# Patient Record
Sex: Male | Born: 1950 | Race: White | Hispanic: No | Marital: Single | Smoking: Current some day smoker
Health system: Southern US, Community
[De-identification: ages and names within clinical notes are randomized; demographics above are authoritative.]

## PROBLEM LIST (undated history)

## (undated) DIAGNOSIS — J189 Pneumonia, unspecified organism: Secondary | ICD-10-CM

## (undated) DIAGNOSIS — C61 Malignant neoplasm of prostate: Secondary | ICD-10-CM

## (undated) DIAGNOSIS — J449 Chronic obstructive pulmonary disease, unspecified: Secondary | ICD-10-CM

## (undated) HISTORY — PX: PROSTATE BIOPSY: SHX241

## (undated) HISTORY — PX: HAND SURGERY: SHX662

## (undated) HISTORY — PX: BACK SURGERY: SHX140

---

## 2004-10-29 ENCOUNTER — Emergency Department (HOSPITAL_COMMUNITY): Admission: EM | Admit: 2004-10-29 | Discharge: 2004-10-29 | Payer: Self-pay | Admitting: Emergency Medicine

## 2004-12-13 ENCOUNTER — Ambulatory Visit (HOSPITAL_COMMUNITY): Admission: RE | Admit: 2004-12-13 | Discharge: 2004-12-13 | Payer: Self-pay | Admitting: Orthopedic Surgery

## 2004-12-13 ENCOUNTER — Ambulatory Visit (HOSPITAL_BASED_OUTPATIENT_CLINIC_OR_DEPARTMENT_OTHER): Admission: RE | Admit: 2004-12-13 | Discharge: 2004-12-13 | Payer: Self-pay | Admitting: Orthopedic Surgery

## 2005-05-13 ENCOUNTER — Emergency Department (HOSPITAL_COMMUNITY): Admission: EM | Admit: 2005-05-13 | Discharge: 2005-05-13 | Payer: Self-pay | Admitting: Emergency Medicine

## 2006-07-10 ENCOUNTER — Emergency Department (HOSPITAL_COMMUNITY): Admission: EM | Admit: 2006-07-10 | Discharge: 2006-07-10 | Payer: Self-pay | Admitting: Emergency Medicine

## 2017-03-06 ENCOUNTER — Encounter: Payer: Self-pay | Admitting: Radiation Oncology

## 2017-03-07 ENCOUNTER — Inpatient Hospital Stay
Admission: RE | Admit: 2017-03-07 | Discharge: 2017-03-07 | Disposition: A | Discharge status: Home / Self Care | Payer: Self-pay | Source: Ambulatory Visit | Attending: Radiation Oncology | Admitting: Radiation Oncology

## 2017-03-07 ENCOUNTER — Other Ambulatory Visit: Payer: Self-pay | Admitting: Radiation Oncology

## 2017-03-07 ENCOUNTER — Ambulatory Visit
Admission: RE | Admit: 2017-03-07 | Discharge: 2017-03-07 | Disposition: A | Discharge status: Home / Self Care | Payer: Self-pay | Source: Ambulatory Visit | Attending: Radiation Oncology | Admitting: Radiation Oncology

## 2017-03-07 DIAGNOSIS — C801 Malignant (primary) neoplasm, unspecified: Secondary | ICD-10-CM

## 2017-03-12 ENCOUNTER — Encounter: Payer: Self-pay | Admitting: Radiation Oncology

## 2017-03-12 DIAGNOSIS — C61 Malignant neoplasm of prostate: Secondary | ICD-10-CM | POA: Insufficient documentation

## 2017-03-12 NOTE — Progress Notes (Signed)
GU Location of Tumor / Histology: prostatic adenocarcinoma  If Prostate Cancer, Gleason Score is (3 + 3) and PSA is (14). Prostate volume: 36 cc.  Kathryne Sharper reports his PSA has been elevated for 4-5 years. States, "it (PSA) was 10, then 12, and 14."  Biopsies of prostate (if applicable) revealed:    Past/Anticipated interventions by urology, if any: biopsy, bone scan, referral to radiation oncology  Past/Anticipated interventions by medical oncology, if any: no  Weight changes, if any: no  Bowel/Bladder complaints, if any: IPSS 7 with frequency and nocturia. Denies dysuria, hematuria, urinary leakage or incontinence.   Nausea/Vomiting, if any: no  Pain issues, if any:  Right hip pain 9 on a scale of 0-10 worse with ambulation. Reports taking OTC MSM and Tumeric plus prescribed Ibuprofen 800 mg.  SAFETY ISSUES:  Prior radiation? no  Pacemaker/ICD? no  Possible current pregnancy? no  Is the patient on methotrexate? no  Current Complaints / other details:  66 year old male.   The suspected tumor is inseparable from the undersurface of the urinary bladder at the base and the inferior anterior aspect of the right seminal vesicle.

## 2017-03-13 ENCOUNTER — Ambulatory Visit
Admission: RE | Admit: 2017-03-13 | Discharge: 2017-03-13 | Disposition: A | Discharge status: Home / Self Care | Payer: Non-veteran care | Source: Ambulatory Visit | Attending: Radiation Oncology | Admitting: Radiation Oncology

## 2017-03-13 ENCOUNTER — Encounter: Payer: Self-pay | Admitting: Radiation Oncology

## 2017-03-13 DIAGNOSIS — J984 Other disorders of lung: Secondary | ICD-10-CM | POA: Insufficient documentation

## 2017-03-13 DIAGNOSIS — Z880 Allergy status to penicillin: Secondary | ICD-10-CM | POA: Insufficient documentation

## 2017-03-13 DIAGNOSIS — C61 Malignant neoplasm of prostate: Secondary | ICD-10-CM

## 2017-03-13 DIAGNOSIS — Z510 Encounter for antineoplastic radiation therapy: Secondary | ICD-10-CM | POA: Insufficient documentation

## 2017-03-13 DIAGNOSIS — Z87891 Personal history of nicotine dependence: Secondary | ICD-10-CM | POA: Diagnosis not present

## 2017-03-13 HISTORY — DX: Malignant neoplasm of prostate: C61

## 2017-03-13 NOTE — Progress Notes (Signed)
Radiation Oncology         [phone] ________________________________  Initial outpatient Consultation  Name: Steve Riley MRN: [id number]  Date: 03/13/2017  DOB: 02-17-1951  CC:No primary care provider on file.  [name], MD   REFERRING PHYSICIAN: [name], MD  DIAGNOSIS: 66 y.o. gentleman with stage T2a prostate cancer, Gleason score of 3+3=6 and PSA is 14.    ICD-10-CM   1. Malignant neoplasm of prostate ([place]) C61 Ambulatory referral to Social Work    HISTORY OF PRESENT ILLNESS: Steve Riley is a 66 y.o. male with a diagnosis of prostate cancer. He was noted to have an elevated PSA of 14 by his primary care physician Dr. Posey Pronto in 03/2016.  Accordingly, he was referred for evaluation in urology by Dr. Aretha Parrot at the [facility],  digital rectal examination was performed at that time revealing a symmetric gland without nodularity.  A prostate MRI was performed in 03/2016 revealing a 2.5cm PI-RADS 5 lesion at the anterior and posterior transition zone mid-gland and extending to the base. The patient proceeded to transrectal ultrasound with 12 biopsies of the prostate on 03/25/16.  The prostate volume measured 36 cc.  Out of 12 core biopsies,4 were positive.  The maximum Gleason score was 3+3, and this was seen in the right base, right mid-transitional zone, left mid and left base.    He had a repeat prostate MRI on 09/03/16 revealing stability of the 2.5 cm PI-RADS 5 lesion at the anterior and posterior transition zone mid-gland, suspicious for ECE and appearing to be inseperable from the bladder base and right seminal vesicle.  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    CT A/P 12/25/16 was negative for metastatic disease in the abdomen or pelvis and bone scan on 02/11/17 was  negative for osseous metastatic disease.  Repeat PSA 01/29/17 was 18.90.  The patient reviewed the biopsy results with his urologist and he has kindly been referred today for discussion of potential radiation treatment options.      PREVIOUS RADIATION THERAPY: No  PAST MEDICAL HISTORY:  Past Medical History:  Diagnosis Date  . Prostate cancer ([place])       PAST SURGICAL HISTORY: Past Surgical History:  Procedure Laterality Date  . BACK SURGERY    . HAND SURGERY    . PROSTATE BIOPSY      FAMILY HISTORY:  Family History  Problem Relation Age of Onset  . Cancer Neg Hx     SOCIAL HISTORY:  Social History   Social History  . Marital status: Single    Spouse name: N/A  . Number of children: N/A  . Years of education: N/A   Occupational History  . Not on file.   Social History Main Topics  . Smoking status: Former Smoker    Packs/day: 3.00    Years: 54.00    Types: Cigarettes    Quit date: 07/11/2016  . Smokeless tobacco: Never Used  . Alcohol use No  . Drug use: No  . Sexual activity: No   Other Topics Concern  . Not on file   Social History Narrative  . No narrative on file    ALLERGIES: Penicillin g  MEDICATIONS:  Current Outpatient Prescriptions  Medication Sig Dispense Refill  . UNKNOWN TO PATIENT Patient does not know names  of his medications     No current facility-administered medications for this encounter.     REVIEW OF SYSTEMS:  On review of systems, the patient reports that he is doing well overall. He denies any chest pain, shortness of breath, cough, fevers, chills, night sweats, unintended weight changes. He denies any bowel disturbances, and denies abdominal pain, nausea or vomiting. He reports chronic bilateral hip pain, right >left progressively worsening over the past year.  He uses a wheelchair due to inability to ambulate without excruciating pain.  He denies any new musculoskeletal or joint aches or pains. His IPSS was 7, indicating  mild urinary symptoms. He is unable to complete sexual activity with most attempts and was previously advised by a MD at the [place] not to use Viagra because he has a diagnosis of prostate cancer. He denies dysuria, hematuria, urinary leakage or incontinence. Right hip pain 9 on a scale of 0-10, worse with weight bearing activity and ambulation. He reports taking OTC MSM and Tumeric plus prescribed Ibuprofen 800 mg for pain.  A complete review of systems is obtained and is otherwise negative.    PHYSICAL EXAM:  Wt Readings from Last 3 Encounters:  03/13/17 210 lb 8 oz (95.5 kg)   Temp Readings from Last 3 Encounters:  No data found for Temp   BP Readings from Last 3 Encounters:  03/13/17 130/81   Pulse Readings from Last 3 Encounters:  03/13/17 99   Pain Assessment Pain Score: 9  (reports right hip pain worse with ambulation) Pain Loc: Hip/10  In general this is a well appearing caucasian male in no acute distress. He is alert and oriented x4 and appropriate throughout the examination. HEENT reveals that the patient is normocephalic, atraumatic. EOMs are intact. PERRLA. Skin is intact with multiple superficial excoriated lesions on his face and bilateral upper extremities. Cardiovascular exam reveals a regular rate and rhythm, no clicks rubs or murmurs are auscultated. Chest is clear to auscultation bilaterally. Lymphatic assessment is performed and does not reveal any adenopathy in the cervical, supraclavicular, axillary, or inguinal chains. Abdomen has active bowel sounds in all quadrants and is intact. The abdomen is soft, non tender, non distended. Lower extremities are negative for pretibial pitting edema, deep calf tenderness, cyanosis or clubbing.  KPS = 70  100 - Normal; no complaints; no evidence of disease. 90   - Able to carry on normal activity; minor signs or symptoms of disease. 80   - Normal activity with effort; some signs or symptoms of disease. 33   - Cares for self; unable  to carry on normal activity or to do active work. 60   - Requires occasional assistance, but is able to care for most of his personal needs. 50   - Requires considerable assistance and frequent medical care. 18   - Disabled; requires special care and assistance. 56   - Severely disabled; hospital admission is indicated although death not imminent. 71   - Very sick; hospital admission necessary; active supportive treatment necessary. 10   - Moribund; fatal processes progressing rapidly. 0     - Dead  Karnofsky DA, Abelmann WH, Craver LS and Burchenal JH [phone]) The use of the nitrogen mustards in the palliative treatment of carcinoma: with particular reference to bronchogenic carcinoma Cancer 1 634-56  LABORATORY DATA:  No results found for: WBC, HGB, HCT, MCV, PLT No results found for: NA, K, CL, CO2 No results found for: ALT, AST, GGT, ALKPHOS, BILITOT   RADIOGRAPHY:  No results found.    IMPRESSION/PLAN: 1. 66 y.o. gentleman with Stage T2a adenocarcinoma of the prostate with Gleason Score of 3+3, and PSA of 18.9. We discussed the patient's workup and outlined the nature of prostate cancer in this setting. The patient's T stage, Gleason's score, and PSA put him into the intermediate risk group. Accordingly, he is eligible for a variety of potential treatment options including brachytherapy, 8 weeks of external radiation or 5 weeks of external radiation followed by a brachytherapy boost. We discussed the available radiation techniques, and focused on the details and logistics and delivery. The patient is not felt to be a good surgical candidate due to underlying lung disease with poor pulmonary function.  The recommendation would be for 8 weeks of prostate IMRT.  We discussed and outlined the risks, benefits, short and long-term effects associated with radiotherapy and compared and contrasted these with prostatectomy. We will be able to forego fiducial marker placement due to use of cone beam CT.  The patient was encouraged to ask questions which were answered to his satisfaction.    At the conclusion of our conversation, the patient elects to proceed with 8 weeks of prostate IMRT and is scheduled for CT Simulation on Thursday 03/20/17 at 10am.  We will share our findings with [facility]. [facility] at the [facility].  We enjoyed meeting with him today, and will look forward to participating in the care of this very nice gentleman.   We spent 60 minutes face to face with the patient and more than 50% of that time was spent in counseling and/or coordination of care.    [name], PA-C    Tyler Pita, MD  [facility] Direct Dial: [phone]  Fax: [phone] [email or web address]  Skype  [email or web address]    This document serves as a record of services personally performed by Tyler Pita, MD and Ashlyn Bruning PA-C. It was created on their behalf by Delton Coombes, a trained medical scribe. The creation of this record is based on the scribe's personal observations and the provider's statements to them. This document has been checked and approved by the attending provider.

## 2017-03-13 NOTE — Progress Notes (Signed)
See progress note under physician encounter. 

## 2017-03-14 ENCOUNTER — Encounter: Payer: Self-pay | Admitting: *Deleted

## 2017-03-14 NOTE — Addendum Note (Signed)
Encounter addended by: Tyler Pita, MD on: 03/14/2017  7:48 AM<BR>    Actions taken: Problem List reviewed

## 2017-03-14 NOTE — Progress Notes (Signed)
[facility] Psychosocial Distress Screening Clinical Social Work  Clinical Social Work was referred by distress screening protocol.  The patient scored a 9 on the Psychosocial Distress Thermometer which indicates severe distress. Clinical Social Worker contacted patient by phone to assess for distress and other psychosocial needs. Steve Riley shared he does not have specific emotional concerns at this time and feels his information concerns were addressed by his physician.  His only concern at this time is balancing his treatment schedule with [facility]- CSW encouraged him to share this information at his upcoming appointment when he receives his radiation schedule. CSW briefly explained CSW role and provided information on prostate cancer support group.  Patient plans to call CSW as needs arise.  ONCBCN DISTRESS SCREENING 03/13/2017  Screening Type Initial Screening  Distress experienced in past week (1-10) 9  Emotional problem type Isolation/feeling alone;Adjusting to appearance changes  Information Concerns Type Lack of info about diagnosis;Lack of info about treatment  Physical Problem type Pain;Getting around;Bathing/dressing;Skin dry/itchy  Physician notified of physical symptoms Yes  Referral to clinical psychology No  Referral to clinical social work Yes  Referral to dietition No  Referral to financial advocate No  Referral to support programs No  Referral to palliative care No  Other [phone]    Maryjean Morn, MSW, LCSW, OSW-C Clinical Social Worker [place] [phone]

## 2017-03-18 DIAGNOSIS — L409 Psoriasis, unspecified: Secondary | ICD-10-CM | POA: Insufficient documentation

## 2017-03-18 DIAGNOSIS — Z8546 Personal history of malignant neoplasm of prostate: Secondary | ICD-10-CM | POA: Insufficient documentation

## 2017-03-20 ENCOUNTER — Encounter: Payer: Self-pay | Admitting: Medical Oncology

## 2017-03-20 ENCOUNTER — Ambulatory Visit
Admission: RE | Admit: 2017-03-20 | Discharge: 2017-03-20 | Disposition: A | Discharge status: Home / Self Care | Payer: Non-veteran care | Source: Ambulatory Visit | Attending: Radiation Oncology | Admitting: Radiation Oncology

## 2017-03-20 DIAGNOSIS — Z510 Encounter for antineoplastic radiation therapy: Secondary | ICD-10-CM | POA: Diagnosis not present

## 2017-03-20 DIAGNOSIS — C61 Malignant neoplasm of prostate: Secondary | ICD-10-CM

## 2017-03-20 NOTE — Progress Notes (Signed)
  Radiation Oncology         [phone] ________________________________  Name: Steve Riley MRN: [id number]  Date: 03/20/2017  DOB: Nov 14, 1950  SIMULATION AND TREATMENT PLANNING NOTE    ICD-10-CM   1. Malignant neoplasm of prostate ([place]) C61     DIAGNOSIS:  66 y.o. gentleman with stage T2a prostate cancer, Gleason score of 3+3=6 and PSA is 14.  NARRATIVE:  The patient was brought to the [facility].  Identity was confirmed.  All relevant records and images related to the planned course of therapy were reviewed.  The patient freely provided informed written consent to proceed with treatment after reviewing the details related to the planned course of therapy. The consent form was witnessed and verified by the simulation staff.  Then, the patient was set-up in a stable reproducible supine position for radiation therapy.  A vacuum lock pillow device was custom fabricated to position his legs in a reproducible immobilized position.  Then, I performed a urethrogram under sterile conditions to identify the prostatic apex.  CT images were obtained.  Surface markings were placed.  The CT images were loaded into the planning software.  Then the prostate target and avoidance structures including the rectum, bladder, bowel and hips were contoured.  Treatment planning then occurred.  The radiation prescription was entered and confirmed.  A total of one complex treatment devices was fabricated. I have requested : Intensity Modulated Radiotherapy (IMRT) is medically necessary for this case for the following reason:  Rectal sparing.Marland Kitchen  PLAN:  The patient will receive 78 Gy in 40 fractions.  ________________________________  Sheral Apley Tammi Klippel, M.D.  This document serves as a record of services personally performed by Tyler Pita MD. It was created on his behalf by Delton Coombes, a trained medical scribe. The creation of this record is based on the scribe's personal observations and  the provider's statements to them. This document has been checked and approved by the attending provider.

## 2017-03-26 ENCOUNTER — Telehealth: Payer: Self-pay | Admitting: *Deleted

## 2017-03-26 NOTE — Telephone Encounter (Signed)
On [facility] consult note

## 2017-03-28 DIAGNOSIS — Z510 Encounter for antineoplastic radiation therapy: Secondary | ICD-10-CM | POA: Diagnosis not present

## 2017-03-31 ENCOUNTER — Ambulatory Visit: Payer: Non-veteran care | Admitting: Radiation Oncology

## 2017-04-01 ENCOUNTER — Ambulatory Visit: Payer: Non-veteran care

## 2017-04-02 ENCOUNTER — Ambulatory Visit
Admission: RE | Admit: 2017-04-02 | Discharge: 2017-04-02 | Disposition: A | Discharge status: Home / Self Care | Payer: Non-veteran care | Source: Ambulatory Visit | Attending: Radiation Oncology | Admitting: Radiation Oncology

## 2017-04-02 DIAGNOSIS — Z510 Encounter for antineoplastic radiation therapy: Secondary | ICD-10-CM | POA: Diagnosis not present

## 2017-04-03 ENCOUNTER — Ambulatory Visit
Admission: RE | Admit: 2017-04-03 | Discharge: 2017-04-03 | Disposition: A | Discharge status: Home / Self Care | Payer: Non-veteran care | Source: Ambulatory Visit | Attending: Radiation Oncology | Admitting: Radiation Oncology

## 2017-04-03 ENCOUNTER — Encounter: Payer: Self-pay | Admitting: Medical Oncology

## 2017-04-03 DIAGNOSIS — Z510 Encounter for antineoplastic radiation therapy: Secondary | ICD-10-CM | POA: Diagnosis not present

## 2017-04-03 NOTE — Progress Notes (Signed)
Steve Riley here for day 2 of radiation. He states that he continues to have excruciationg pain in his hips. He has contacted the [place] in [place], left messages with the nurses but no return calls. I discussed getting a referral to an orthopedic physician to evaluate his hips. He agrees with this suggestion. He states he will need to complete radiation first before he can have any treatments to his hips. I explained he could be evaluated and get pain medication to improve pain and quality of life.

## 2017-04-04 ENCOUNTER — Ambulatory Visit
Admission: RE | Admit: 2017-04-04 | Discharge: 2017-04-04 | Disposition: A | Discharge status: Home / Self Care | Payer: Non-veteran care | Source: Ambulatory Visit | Attending: Radiation Oncology | Admitting: Radiation Oncology

## 2017-04-04 DIAGNOSIS — Z510 Encounter for antineoplastic radiation therapy: Secondary | ICD-10-CM | POA: Diagnosis not present

## 2017-04-07 ENCOUNTER — Ambulatory Visit
Admission: RE | Admit: 2017-04-07 | Discharge: 2017-04-07 | Disposition: A | Discharge status: Home / Self Care | Payer: Non-veteran care | Source: Ambulatory Visit | Attending: Radiation Oncology | Admitting: Radiation Oncology

## 2017-04-07 DIAGNOSIS — Z510 Encounter for antineoplastic radiation therapy: Secondary | ICD-10-CM | POA: Diagnosis not present

## 2017-04-08 ENCOUNTER — Ambulatory Visit
Admission: RE | Admit: 2017-04-08 | Discharge: 2017-04-08 | Disposition: A | Discharge status: Home / Self Care | Payer: Non-veteran care | Source: Ambulatory Visit | Attending: Radiation Oncology | Admitting: Radiation Oncology

## 2017-04-08 DIAGNOSIS — Z510 Encounter for antineoplastic radiation therapy: Secondary | ICD-10-CM | POA: Diagnosis not present

## 2017-04-09 ENCOUNTER — Ambulatory Visit
Admission: RE | Admit: 2017-04-09 | Discharge: 2017-04-09 | Disposition: A | Discharge status: Home / Self Care | Payer: Non-veteran care | Source: Ambulatory Visit | Attending: Radiation Oncology | Admitting: Radiation Oncology

## 2017-04-09 DIAGNOSIS — Z510 Encounter for antineoplastic radiation therapy: Secondary | ICD-10-CM | POA: Diagnosis not present

## 2017-04-10 ENCOUNTER — Ambulatory Visit
Admission: RE | Admit: 2017-04-10 | Discharge: 2017-04-10 | Disposition: A | Discharge status: Home / Self Care | Payer: Non-veteran care | Source: Ambulatory Visit | Attending: Radiation Oncology | Admitting: Radiation Oncology

## 2017-04-10 DIAGNOSIS — Z510 Encounter for antineoplastic radiation therapy: Secondary | ICD-10-CM | POA: Diagnosis not present

## 2017-04-11 ENCOUNTER — Ambulatory Visit
Admission: RE | Admit: 2017-04-11 | Discharge: 2017-04-11 | Disposition: A | Discharge status: Home / Self Care | Payer: Non-veteran care | Source: Ambulatory Visit | Attending: Radiation Oncology | Admitting: Radiation Oncology

## 2017-04-11 DIAGNOSIS — Z510 Encounter for antineoplastic radiation therapy: Secondary | ICD-10-CM | POA: Diagnosis not present

## 2017-04-14 ENCOUNTER — Ambulatory Visit
Admission: RE | Admit: 2017-04-14 | Discharge: 2017-04-14 | Disposition: A | Discharge status: Home / Self Care | Payer: Non-veteran care | Source: Ambulatory Visit | Attending: Radiation Oncology | Admitting: Radiation Oncology

## 2017-04-14 DIAGNOSIS — Z510 Encounter for antineoplastic radiation therapy: Secondary | ICD-10-CM | POA: Diagnosis not present

## 2017-04-15 ENCOUNTER — Ambulatory Visit
Admission: RE | Admit: 2017-04-15 | Discharge: 2017-04-15 | Disposition: A | Discharge status: Home / Self Care | Payer: Non-veteran care | Source: Ambulatory Visit | Attending: Radiation Oncology | Admitting: Radiation Oncology

## 2017-04-15 DIAGNOSIS — Z510 Encounter for antineoplastic radiation therapy: Secondary | ICD-10-CM | POA: Diagnosis not present

## 2017-04-16 ENCOUNTER — Ambulatory Visit
Admission: RE | Admit: 2017-04-16 | Discharge: 2017-04-16 | Disposition: A | Discharge status: Home / Self Care | Payer: Non-veteran care | Source: Ambulatory Visit | Attending: Radiation Oncology | Admitting: Radiation Oncology

## 2017-04-16 DIAGNOSIS — Z510 Encounter for antineoplastic radiation therapy: Secondary | ICD-10-CM | POA: Diagnosis not present

## 2017-04-17 ENCOUNTER — Ambulatory Visit
Admission: RE | Admit: 2017-04-17 | Discharge: 2017-04-17 | Disposition: A | Discharge status: Home / Self Care | Payer: Non-veteran care | Source: Ambulatory Visit | Attending: Radiation Oncology | Admitting: Radiation Oncology

## 2017-04-17 DIAGNOSIS — Z510 Encounter for antineoplastic radiation therapy: Secondary | ICD-10-CM | POA: Diagnosis not present

## 2017-04-18 ENCOUNTER — Ambulatory Visit
Admission: RE | Admit: 2017-04-18 | Discharge: 2017-04-18 | Disposition: A | Discharge status: Home / Self Care | Payer: Non-veteran care | Source: Ambulatory Visit | Attending: Radiation Oncology | Admitting: Radiation Oncology

## 2017-04-18 DIAGNOSIS — Z510 Encounter for antineoplastic radiation therapy: Secondary | ICD-10-CM | POA: Diagnosis not present

## 2017-04-21 ENCOUNTER — Ambulatory Visit
Admission: RE | Admit: 2017-04-21 | Discharge: 2017-04-21 | Disposition: A | Discharge status: Home / Self Care | Payer: Non-veteran care | Source: Ambulatory Visit | Attending: Radiation Oncology | Admitting: Radiation Oncology

## 2017-04-21 DIAGNOSIS — Z510 Encounter for antineoplastic radiation therapy: Secondary | ICD-10-CM | POA: Diagnosis not present

## 2017-04-22 ENCOUNTER — Ambulatory Visit
Admission: RE | Admit: 2017-04-22 | Discharge: 2017-04-22 | Disposition: A | Discharge status: Home / Self Care | Payer: Non-veteran care | Source: Ambulatory Visit | Attending: Radiation Oncology | Admitting: Radiation Oncology

## 2017-04-22 DIAGNOSIS — Z510 Encounter for antineoplastic radiation therapy: Secondary | ICD-10-CM | POA: Diagnosis not present

## 2017-04-23 ENCOUNTER — Ambulatory Visit
Admission: RE | Admit: 2017-04-23 | Discharge: 2017-04-23 | Disposition: A | Discharge status: Home / Self Care | Payer: Non-veteran care | Source: Ambulatory Visit | Attending: Radiation Oncology | Admitting: Radiation Oncology

## 2017-04-23 DIAGNOSIS — Z510 Encounter for antineoplastic radiation therapy: Secondary | ICD-10-CM | POA: Diagnosis not present

## 2017-04-24 ENCOUNTER — Ambulatory Visit
Admission: RE | Admit: 2017-04-24 | Discharge: 2017-04-24 | Disposition: A | Discharge status: Home / Self Care | Payer: Non-veteran care | Source: Ambulatory Visit | Attending: Radiation Oncology | Admitting: Radiation Oncology

## 2017-04-24 DIAGNOSIS — Z510 Encounter for antineoplastic radiation therapy: Secondary | ICD-10-CM | POA: Diagnosis not present

## 2017-04-25 ENCOUNTER — Ambulatory Visit
Admission: RE | Admit: 2017-04-25 | Discharge: 2017-04-25 | Disposition: A | Discharge status: Home / Self Care | Payer: Non-veteran care | Source: Ambulatory Visit | Attending: Radiation Oncology | Admitting: Radiation Oncology

## 2017-04-25 DIAGNOSIS — Z510 Encounter for antineoplastic radiation therapy: Secondary | ICD-10-CM | POA: Diagnosis not present

## 2017-04-27 ENCOUNTER — Ambulatory Visit
Admission: RE | Admit: 2017-04-27 | Discharge: 2017-04-27 | Disposition: A | Discharge status: Home / Self Care | Payer: Non-veteran care | Source: Ambulatory Visit | Attending: Radiation Oncology | Admitting: Radiation Oncology

## 2017-04-27 DIAGNOSIS — Z510 Encounter for antineoplastic radiation therapy: Secondary | ICD-10-CM | POA: Diagnosis not present

## 2017-04-28 ENCOUNTER — Ambulatory Visit
Admission: RE | Admit: 2017-04-28 | Discharge: 2017-04-28 | Disposition: A | Discharge status: Home / Self Care | Payer: Non-veteran care | Source: Ambulatory Visit | Attending: Radiation Oncology | Admitting: Radiation Oncology

## 2017-04-28 DIAGNOSIS — Z510 Encounter for antineoplastic radiation therapy: Secondary | ICD-10-CM | POA: Diagnosis not present

## 2017-04-29 ENCOUNTER — Ambulatory Visit
Admission: RE | Admit: 2017-04-29 | Discharge: 2017-04-29 | Disposition: A | Discharge status: Home / Self Care | Payer: Non-veteran care | Source: Ambulatory Visit | Attending: Radiation Oncology | Admitting: Radiation Oncology

## 2017-04-29 DIAGNOSIS — Z510 Encounter for antineoplastic radiation therapy: Secondary | ICD-10-CM | POA: Diagnosis not present

## 2017-04-30 ENCOUNTER — Ambulatory Visit
Admission: RE | Admit: 2017-04-30 | Discharge: 2017-04-30 | Disposition: A | Discharge status: Home / Self Care | Payer: Non-veteran care | Source: Ambulatory Visit | Attending: Radiation Oncology | Admitting: Radiation Oncology

## 2017-04-30 DIAGNOSIS — Z510 Encounter for antineoplastic radiation therapy: Secondary | ICD-10-CM | POA: Diagnosis not present

## 2017-05-05 ENCOUNTER — Ambulatory Visit
Admission: RE | Admit: 2017-05-05 | Discharge: 2017-05-05 | Disposition: A | Discharge status: Home / Self Care | Payer: Non-veteran care | Source: Ambulatory Visit | Attending: Radiation Oncology | Admitting: Radiation Oncology

## 2017-05-05 DIAGNOSIS — Z510 Encounter for antineoplastic radiation therapy: Secondary | ICD-10-CM | POA: Diagnosis not present

## 2017-05-06 ENCOUNTER — Ambulatory Visit
Admission: RE | Admit: 2017-05-06 | Discharge: 2017-05-06 | Disposition: A | Discharge status: Home / Self Care | Payer: Non-veteran care | Source: Ambulatory Visit | Attending: Radiation Oncology | Admitting: Radiation Oncology

## 2017-05-06 DIAGNOSIS — Z510 Encounter for antineoplastic radiation therapy: Secondary | ICD-10-CM | POA: Diagnosis not present

## 2017-05-07 ENCOUNTER — Ambulatory Visit
Admission: RE | Admit: 2017-05-07 | Discharge: 2017-05-07 | Disposition: A | Discharge status: Home / Self Care | Payer: Non-veteran care | Source: Ambulatory Visit | Attending: Radiation Oncology | Admitting: Radiation Oncology

## 2017-05-07 DIAGNOSIS — Z510 Encounter for antineoplastic radiation therapy: Secondary | ICD-10-CM | POA: Diagnosis not present

## 2017-05-08 ENCOUNTER — Ambulatory Visit
Admission: RE | Admit: 2017-05-08 | Discharge: 2017-05-08 | Disposition: A | Discharge status: Home / Self Care | Payer: Non-veteran care | Source: Ambulatory Visit | Attending: Radiation Oncology | Admitting: Radiation Oncology

## 2017-05-08 DIAGNOSIS — Z510 Encounter for antineoplastic radiation therapy: Secondary | ICD-10-CM | POA: Diagnosis not present

## 2017-05-09 ENCOUNTER — Ambulatory Visit
Admission: RE | Admit: 2017-05-09 | Discharge: 2017-05-09 | Disposition: A | Discharge status: Home / Self Care | Payer: Non-veteran care | Source: Ambulatory Visit | Attending: Radiation Oncology | Admitting: Radiation Oncology

## 2017-05-09 DIAGNOSIS — Z510 Encounter for antineoplastic radiation therapy: Secondary | ICD-10-CM | POA: Diagnosis not present

## 2017-05-12 ENCOUNTER — Ambulatory Visit
Admission: RE | Admit: 2017-05-12 | Discharge: 2017-05-12 | Disposition: A | Discharge status: Home / Self Care | Payer: Non-veteran care | Source: Ambulatory Visit | Attending: Radiation Oncology | Admitting: Radiation Oncology

## 2017-05-12 DIAGNOSIS — Z510 Encounter for antineoplastic radiation therapy: Secondary | ICD-10-CM | POA: Diagnosis not present

## 2017-05-13 ENCOUNTER — Ambulatory Visit
Admission: RE | Admit: 2017-05-13 | Discharge: 2017-05-13 | Disposition: A | Discharge status: Home / Self Care | Payer: Non-veteran care | Source: Ambulatory Visit | Attending: Radiation Oncology | Admitting: Radiation Oncology

## 2017-05-13 DIAGNOSIS — Z510 Encounter for antineoplastic radiation therapy: Secondary | ICD-10-CM | POA: Diagnosis not present

## 2017-05-14 ENCOUNTER — Ambulatory Visit
Admission: RE | Admit: 2017-05-14 | Discharge: 2017-05-14 | Disposition: A | Discharge status: Home / Self Care | Payer: Non-veteran care | Source: Ambulatory Visit | Attending: Radiation Oncology | Admitting: Radiation Oncology

## 2017-05-14 DIAGNOSIS — Z510 Encounter for antineoplastic radiation therapy: Secondary | ICD-10-CM | POA: Diagnosis not present

## 2017-05-15 ENCOUNTER — Ambulatory Visit
Admission: RE | Admit: 2017-05-15 | Discharge: 2017-05-15 | Disposition: A | Discharge status: Home / Self Care | Payer: Non-veteran care | Source: Ambulatory Visit | Attending: Radiation Oncology | Admitting: Radiation Oncology

## 2017-05-15 DIAGNOSIS — Z510 Encounter for antineoplastic radiation therapy: Secondary | ICD-10-CM | POA: Diagnosis not present

## 2017-05-16 ENCOUNTER — Ambulatory Visit
Admission: RE | Admit: 2017-05-16 | Discharge: 2017-05-16 | Disposition: A | Discharge status: Home / Self Care | Payer: Non-veteran care | Source: Ambulatory Visit | Attending: Radiation Oncology | Admitting: Radiation Oncology

## 2017-05-16 DIAGNOSIS — Z510 Encounter for antineoplastic radiation therapy: Secondary | ICD-10-CM | POA: Diagnosis not present

## 2017-05-19 ENCOUNTER — Ambulatory Visit: Payer: Non-veteran care

## 2017-05-20 ENCOUNTER — Ambulatory Visit
Admission: RE | Admit: 2017-05-20 | Discharge: 2017-05-20 | Disposition: A | Discharge status: Home / Self Care | Payer: Non-veteran care | Source: Ambulatory Visit | Attending: Radiation Oncology | Admitting: Radiation Oncology

## 2017-05-20 DIAGNOSIS — Z510 Encounter for antineoplastic radiation therapy: Secondary | ICD-10-CM | POA: Diagnosis not present

## 2017-05-21 ENCOUNTER — Ambulatory Visit
Admission: RE | Admit: 2017-05-21 | Discharge: 2017-05-21 | Disposition: A | Discharge status: Home / Self Care | Payer: Non-veteran care | Source: Ambulatory Visit | Attending: Radiation Oncology | Admitting: Radiation Oncology

## 2017-05-21 DIAGNOSIS — Z510 Encounter for antineoplastic radiation therapy: Secondary | ICD-10-CM | POA: Diagnosis not present

## 2017-05-22 ENCOUNTER — Ambulatory Visit
Admission: RE | Admit: 2017-05-22 | Discharge: 2017-05-22 | Disposition: A | Discharge status: Home / Self Care | Payer: Non-veteran care | Source: Ambulatory Visit | Attending: Radiation Oncology | Admitting: Radiation Oncology

## 2017-05-22 DIAGNOSIS — Z510 Encounter for antineoplastic radiation therapy: Secondary | ICD-10-CM | POA: Diagnosis not present

## 2017-05-23 ENCOUNTER — Ambulatory Visit
Admission: RE | Admit: 2017-05-23 | Discharge: 2017-05-23 | Disposition: A | Discharge status: Home / Self Care | Payer: Non-veteran care | Source: Ambulatory Visit | Attending: Radiation Oncology | Admitting: Radiation Oncology

## 2017-05-23 DIAGNOSIS — Z510 Encounter for antineoplastic radiation therapy: Secondary | ICD-10-CM | POA: Diagnosis not present

## 2017-05-24 ENCOUNTER — Ambulatory Visit
Admission: RE | Admit: 2017-05-24 | Discharge: 2017-05-24 | Disposition: A | Discharge status: Home / Self Care | Payer: Non-veteran care | Source: Ambulatory Visit | Attending: Radiation Oncology | Admitting: Radiation Oncology

## 2017-05-24 DIAGNOSIS — Z510 Encounter for antineoplastic radiation therapy: Secondary | ICD-10-CM | POA: Diagnosis not present

## 2017-05-26 ENCOUNTER — Ambulatory Visit
Admission: RE | Admit: 2017-05-26 | Discharge: 2017-05-26 | Disposition: A | Discharge status: Home / Self Care | Payer: Non-veteran care | Source: Ambulatory Visit | Attending: Radiation Oncology | Admitting: Radiation Oncology

## 2017-05-26 DIAGNOSIS — Z510 Encounter for antineoplastic radiation therapy: Secondary | ICD-10-CM | POA: Diagnosis not present

## 2017-05-27 ENCOUNTER — Ambulatory Visit
Admission: RE | Admit: 2017-05-27 | Discharge: 2017-05-27 | Disposition: A | Discharge status: Home / Self Care | Payer: Non-veteran care | Source: Ambulatory Visit | Attending: Radiation Oncology | Admitting: Radiation Oncology

## 2017-05-27 ENCOUNTER — Ambulatory Visit: Payer: Non-veteran care

## 2017-05-27 DIAGNOSIS — Z510 Encounter for antineoplastic radiation therapy: Secondary | ICD-10-CM | POA: Diagnosis not present

## 2017-05-28 ENCOUNTER — Ambulatory Visit: Payer: Non-veteran care

## 2017-05-28 ENCOUNTER — Encounter: Payer: Self-pay | Admitting: Radiation Oncology

## 2017-05-28 ENCOUNTER — Ambulatory Visit
Admission: RE | Admit: 2017-05-28 | Discharge: 2017-05-28 | Disposition: A | Discharge status: Home / Self Care | Payer: Non-veteran care | Source: Ambulatory Visit | Attending: Radiation Oncology | Admitting: Radiation Oncology

## 2017-05-28 DIAGNOSIS — Z510 Encounter for antineoplastic radiation therapy: Secondary | ICD-10-CM | POA: Diagnosis not present

## 2017-05-29 ENCOUNTER — Ambulatory Visit: Payer: Non-veteran care

## 2017-05-30 NOTE — Progress Notes (Signed)
  Radiation Oncology         [phone] ________________________________  Name: Steve Riley MRN: [id number]  Date: 05/28/2017  DOB: [birth year]  End of Treatment Note  Diagnosis:   66 y.o. gentleman with stage T2a prostate cancer, Gleason score of 3+3=6 and PSA is 14.  Indication for treatment:  Curative       Radiation treatment dates:   04/02/17 - 05/28/17  Site/dose:   The prostate was treated to 78 Gy in 40 fractions of 1.95 Gy  Beams/energy:   The patient was treated with IMRT using volumetric arc therapy delivering 6 MV X-rays to clockwise and counterclockwise circumferential arcs with a 90 degree collimator offset to avoid dose scalloping.  Image guidance was performed with daily cone beam CT prior to each fraction to align to gold markers in the prostate and assure proper bladder and rectal fill volumes.  Immobilization was achieved with [employer] custom mold.  Narrative: The patient tolerated radiation treatment relatively well. He reported chronic hip pain, which conitnued throughtout treatment. He experienced nocturia 3 to 4 times and new onset of weak stream. He denied dysuria, hematuria, straining to void or difficulty emptying his bladder. He also denied any diarrhea but did report having mild fatigue.  Plan: The patient has completed radiation treatment. The patient will return to radiation oncology clinic for routine followup in one month. I advised him to call or return sooner if he has any questions or concerns related to his recovery or treatment. ________________________________  Sheral Apley. Tammi Klippel, M.D.   This document serves as a record of services personally performed by Tyler Pita MD. It was created on his behalf by Delton Coombes, a trained medical scribe. The creation of this record is based on the scribe's personal observations and the provider's statements to them.

## 2017-06-16 DIAGNOSIS — L281 Prurigo nodularis: Secondary | ICD-10-CM | POA: Diagnosis not present

## 2017-06-16 DIAGNOSIS — L905 Scar conditions and fibrosis of skin: Secondary | ICD-10-CM | POA: Diagnosis not present

## 2017-06-16 DIAGNOSIS — R21 Rash and other nonspecific skin eruption: Secondary | ICD-10-CM | POA: Diagnosis not present

## 2017-07-02 ENCOUNTER — Other Ambulatory Visit: Payer: Self-pay

## 2017-07-02 ENCOUNTER — Encounter: Payer: Self-pay | Admitting: Urology

## 2017-07-02 ENCOUNTER — Ambulatory Visit
Admission: RE | Admit: 2017-07-02 | Discharge: 2017-07-02 | Disposition: A | Discharge status: Home / Self Care | Payer: Non-veteran care | Source: Ambulatory Visit | Attending: Urology | Admitting: Urology

## 2017-07-02 VITALS — BP 170/89 | HR 81 | Temp 97.6°F | Resp 20 | Ht 68.0 in | Wt 214.8 lb

## 2017-07-02 DIAGNOSIS — Z923 Personal history of irradiation: Secondary | ICD-10-CM | POA: Diagnosis not present

## 2017-07-02 DIAGNOSIS — Z880 Allergy status to penicillin: Secondary | ICD-10-CM | POA: Insufficient documentation

## 2017-07-02 DIAGNOSIS — C61 Malignant neoplasm of prostate: Secondary | ICD-10-CM | POA: Diagnosis not present

## 2017-07-02 DIAGNOSIS — G8929 Other chronic pain: Secondary | ICD-10-CM | POA: Insufficient documentation

## 2017-07-02 NOTE — Progress Notes (Signed)
  Radiation Oncology         [phone] ________________________________  Name: Steve Riley MRN: [id number]  Date: 07/02/2017  DOB: 1951/02/05  Post Treatment Note  CC: System, Pcp Not In  [name], MD  Diagnosis:   67 y.o. gentleman with stage T2a prostate cancer, Gleason score of 3+3=6 and PSA is 14.  Interval Since Last Radiation:  5 weeks  04/02/17 - 05/28/17:  The prostate was treated to 78 Gy in 40 fractions of 1.95 Gy  Narrative:  The patient returns today for routine follow-up.  He tolerated radiation treatment relatively well. He reported chronic hip pain, which conitnued throughtout treatment. He experienced nocturia 3 to 4 times and new onset of weak stream. He denied dysuria, hematuria, straining to void or difficulty emptying his bladder. He also denied any diarrhea but did report having mild fatigue.                             On review of systems, the patient states that he is doing very well overall. His current IPSS score is 11 indicating moderate LUTS with increased frequency, intermittency and nocturia 3-4 times per night.  He specifically denies dysuria, gross hematuria, incomplete emptying or incontinence. He reports normal bowel movements daily and denies abdominal pain, nausea, vomiting or diarrhea. He reports a healthy appetite and is maintaining his weight. He has not noted a significant decline in his energy.  ALLERGIES:  is allergic to penicillin g.  Meds: Current Outpatient Medications  Medication Sig Dispense Refill  . UNKNOWN TO PATIENT Patient does not know names of his medications     No current facility-administered medications for this encounter.     Physical Findings:  height is 5\' 8"  (1.727 m) and weight is 214 lb 12.8 oz (97.4 kg). His oral temperature is 97.6 F (36.4 C). His blood pressure is 170/89 (abnormal) and his pulse is 81. His respiration is 20 and oxygen saturation is 96%.  Pain Assessment Pain Score: 8  Pain Loc: Hip(Right  hip)/10 In general this is a well appearing Caucasian male in no acute distress. He's alert and oriented x4 and appropriate throughout the examination. Cardiopulmonary assessment is negative for acute distress and he exhibits normal effort.   Lab Findings: No results found for: WBC, HGB, HCT, MCV, PLT   Radiographic Findings: No results found.  Impression/Plan: 1. 67 y.o. gentleman with stage T2a prostate cancer, Gleason score of 3+3 and PSA is 14. He will continue to follow up with urology for ongoing PSA determinations and has an appointment scheduled with Dr. Sherral Hammers at the [facility] on 07/10/17. He understands what to expect with regards to PSA monitoring going forward. I will look forward to following his response to treatment via correspondence with urology, and would be happy to continue to participate in his care if clinically indicated. I talked to the patient about what to expect in the future, including his risk for erectile dysfunction and rectal bleeding. I encouraged him to call or return to the office if he has any questions regarding his previous radiation or possible radiation side effects. He was comfortable with this plan and will follow up as needed.    [name], PA-C

## 2017-07-28 ENCOUNTER — Encounter: Payer: Self-pay | Admitting: *Deleted

## 2017-07-28 NOTE — Progress Notes (Signed)
On 07-28-17 fax medical records to [facility] health care clinic, it was the last note from 07-02-17

## 2017-08-14 DIAGNOSIS — L281 Prurigo nodularis: Secondary | ICD-10-CM | POA: Diagnosis not present

## 2017-08-14 DIAGNOSIS — R21 Rash and other nonspecific skin eruption: Secondary | ICD-10-CM | POA: Diagnosis not present

## 2017-10-15 DIAGNOSIS — N481 Balanitis: Secondary | ICD-10-CM | POA: Diagnosis not present

## 2017-10-15 DIAGNOSIS — L281 Prurigo nodularis: Secondary | ICD-10-CM | POA: Diagnosis not present

## 2017-12-17 DIAGNOSIS — R21 Rash and other nonspecific skin eruption: Secondary | ICD-10-CM | POA: Diagnosis not present

## 2017-12-25 DIAGNOSIS — R000 Tachycardia, unspecified: Secondary | ICD-10-CM | POA: Diagnosis not present

## 2017-12-25 DIAGNOSIS — J80 Acute respiratory distress syndrome: Secondary | ICD-10-CM | POA: Diagnosis not present

## 2017-12-25 DIAGNOSIS — F17210 Nicotine dependence, cigarettes, uncomplicated: Secondary | ICD-10-CM | POA: Diagnosis not present

## 2017-12-25 DIAGNOSIS — R062 Wheezing: Secondary | ICD-10-CM | POA: Diagnosis not present

## 2017-12-25 DIAGNOSIS — R0600 Dyspnea, unspecified: Secondary | ICD-10-CM | POA: Diagnosis not present

## 2017-12-25 DIAGNOSIS — R609 Edema, unspecified: Secondary | ICD-10-CM | POA: Diagnosis not present

## 2017-12-25 DIAGNOSIS — R0689 Other abnormalities of breathing: Secondary | ICD-10-CM | POA: Diagnosis not present

## 2017-12-25 DIAGNOSIS — I700 Atherosclerosis of aorta: Secondary | ICD-10-CM | POA: Diagnosis not present

## 2017-12-25 DIAGNOSIS — R0602 Shortness of breath: Secondary | ICD-10-CM | POA: Diagnosis not present

## 2017-12-25 DIAGNOSIS — Z880 Allergy status to penicillin: Secondary | ICD-10-CM | POA: Diagnosis not present

## 2017-12-25 DIAGNOSIS — J441 Chronic obstructive pulmonary disease with (acute) exacerbation: Secondary | ICD-10-CM | POA: Diagnosis not present

## 2017-12-25 DIAGNOSIS — R0603 Acute respiratory distress: Secondary | ICD-10-CM | POA: Diagnosis not present

## 2018-01-28 DIAGNOSIS — R062 Wheezing: Secondary | ICD-10-CM | POA: Diagnosis not present

## 2018-01-28 DIAGNOSIS — R0602 Shortness of breath: Secondary | ICD-10-CM | POA: Diagnosis not present

## 2018-01-28 DIAGNOSIS — J80 Acute respiratory distress syndrome: Secondary | ICD-10-CM | POA: Diagnosis not present

## 2018-01-28 DIAGNOSIS — I10 Essential (primary) hypertension: Secondary | ICD-10-CM | POA: Diagnosis not present

## 2018-01-28 DIAGNOSIS — I4510 Unspecified right bundle-branch block: Secondary | ICD-10-CM | POA: Diagnosis not present

## 2018-01-28 DIAGNOSIS — J441 Chronic obstructive pulmonary disease with (acute) exacerbation: Secondary | ICD-10-CM | POA: Diagnosis not present

## 2018-03-06 ENCOUNTER — Other Ambulatory Visit: Payer: Self-pay

## 2018-03-06 ENCOUNTER — Encounter (HOSPITAL_COMMUNITY): Payer: Self-pay | Admitting: Emergency Medicine

## 2018-03-06 ENCOUNTER — Emergency Department (HOSPITAL_COMMUNITY): Payer: Medicare HMO

## 2018-03-06 ENCOUNTER — Inpatient Hospital Stay (HOSPITAL_COMMUNITY)
Admission: EM | Admit: 2018-03-06 | Discharge: 2018-03-09 | DRG: 192 | Disposition: A | Discharge status: Home / Self Care | Payer: Medicare HMO | Attending: Internal Medicine | Admitting: Internal Medicine

## 2018-03-06 DIAGNOSIS — I10 Essential (primary) hypertension: Secondary | ICD-10-CM | POA: Diagnosis not present

## 2018-03-06 DIAGNOSIS — L281 Prurigo nodularis: Secondary | ICD-10-CM

## 2018-03-06 DIAGNOSIS — Z23 Encounter for immunization: Secondary | ICD-10-CM

## 2018-03-06 DIAGNOSIS — Z87891 Personal history of nicotine dependence: Secondary | ICD-10-CM

## 2018-03-06 DIAGNOSIS — R0603 Acute respiratory distress: Secondary | ICD-10-CM | POA: Diagnosis not present

## 2018-03-06 DIAGNOSIS — T380X5A Adverse effect of glucocorticoids and synthetic analogues, initial encounter: Secondary | ICD-10-CM | POA: Diagnosis not present

## 2018-03-06 DIAGNOSIS — Z7951 Long term (current) use of inhaled steroids: Secondary | ICD-10-CM | POA: Diagnosis not present

## 2018-03-06 DIAGNOSIS — Z880 Allergy status to penicillin: Secondary | ICD-10-CM | POA: Diagnosis not present

## 2018-03-06 DIAGNOSIS — R0602 Shortness of breath: Secondary | ICD-10-CM | POA: Diagnosis not present

## 2018-03-06 DIAGNOSIS — Z79899 Other long term (current) drug therapy: Secondary | ICD-10-CM | POA: Diagnosis not present

## 2018-03-06 DIAGNOSIS — J441 Chronic obstructive pulmonary disease with (acute) exacerbation: Secondary | ICD-10-CM | POA: Diagnosis not present

## 2018-03-06 DIAGNOSIS — E1165 Type 2 diabetes mellitus with hyperglycemia: Secondary | ICD-10-CM | POA: Diagnosis present

## 2018-03-06 DIAGNOSIS — R0600 Dyspnea, unspecified: Secondary | ICD-10-CM | POA: Diagnosis not present

## 2018-03-06 DIAGNOSIS — C61 Malignant neoplasm of prostate: Secondary | ICD-10-CM | POA: Diagnosis present

## 2018-03-06 DIAGNOSIS — R0689 Other abnormalities of breathing: Secondary | ICD-10-CM | POA: Diagnosis not present

## 2018-03-06 DIAGNOSIS — R0989 Other specified symptoms and signs involving the circulatory and respiratory systems: Secondary | ICD-10-CM | POA: Diagnosis not present

## 2018-03-06 DIAGNOSIS — R05 Cough: Secondary | ICD-10-CM | POA: Diagnosis not present

## 2018-03-06 DIAGNOSIS — R140 Abdominal distension (gaseous): Secondary | ICD-10-CM | POA: Diagnosis not present

## 2018-03-06 DIAGNOSIS — J449 Chronic obstructive pulmonary disease, unspecified: Secondary | ICD-10-CM | POA: Diagnosis not present

## 2018-03-06 DIAGNOSIS — R509 Fever, unspecified: Secondary | ICD-10-CM | POA: Diagnosis not present

## 2018-03-06 HISTORY — DX: Chronic obstructive pulmonary disease, unspecified: J44.9

## 2018-03-06 LAB — I-STAT VENOUS BLOOD GAS, ED
Acid-base deficit: 1 mmol/L (ref 0.0–2.0)
Bicarbonate: 22.9 mmol/L (ref 20.0–28.0)
O2 Saturation: 81 %
TCO2: 24 mmol/L (ref 22–32)
pCO2, Ven: 33.8 mmHg — ABNORMAL LOW (ref 44.0–60.0)
pH, Ven: 7.439 — ABNORMAL HIGH (ref 7.250–7.430)
pO2, Ven: 43 mmHg (ref 32.0–45.0)

## 2018-03-06 LAB — CBC
HCT: 44.7 % (ref 39.0–52.0)
Hemoglobin: 14.6 g/dL (ref 13.0–17.0)
MCH: 29.4 pg (ref 26.0–34.0)
MCHC: 32.7 g/dL (ref 30.0–36.0)
MCV: 90.1 fL (ref 78.0–100.0)
Platelets: 286 10*3/uL (ref 150–400)
RBC: 4.96 MIL/uL (ref 4.22–5.81)
RDW: 12.2 % (ref 11.5–15.5)
WBC: 9.2 10*3/uL (ref 4.0–10.5)

## 2018-03-06 LAB — I-STAT TROPONIN, ED: Troponin i, poc: 0 ng/mL (ref 0.00–0.08)

## 2018-03-06 LAB — BASIC METABOLIC PANEL
Anion gap: 13 (ref 5–15)
BUN: 9 mg/dL (ref 8–23)
CO2: 23 mmol/L (ref 22–32)
Calcium: 9.9 mg/dL (ref 8.9–10.3)
Chloride: 102 mmol/L (ref 98–111)
Creatinine, Ser: 0.85 mg/dL (ref 0.61–1.24)
GFR calc Af Amer: >60 mL/min (ref >60)
GFR calc non Af Amer: >60 mL/min (ref >60)
Glucose, Bld: 124 mg/dL — ABNORMAL HIGH (ref 70–99)
Potassium: 3.6 mmol/L (ref 3.5–5.1)
Sodium: 138 mmol/L (ref 135–145)

## 2018-03-06 MED ORDER — POLYETHYLENE GLYCOL 3350 17 G PO PACK: 17.0000 g | PACK | Freq: Every day | ORAL | Status: DC | PRN

## 2018-03-06 MED ORDER — ACETAMINOPHEN 650 MG RE SUPP: 650.0000 mg | Freq: Four times a day (QID) | RECTAL | Status: DC | PRN

## 2018-03-06 MED ORDER — BISACODYL 10 MG RE SUPP: 10.0000 mg | Freq: Every day | RECTAL | Status: DC | PRN

## 2018-03-06 MED ORDER — IPRATROPIUM BROMIDE 0.02 % IN SOLN: 0.5000 mg | Freq: Four times a day (QID) | RESPIRATORY_TRACT | Status: DC

## 2018-03-06 MED ORDER — OMEGA-3-ACID ETHYL ESTERS 1 G PO CAPS
1.0000 g | ORAL_CAPSULE | Freq: Two times a day (BID) | ORAL | Status: DC
  Administered 2018-03-06 – 2018-03-09 (×6): 1 g via ORAL
  Filled 2018-03-06 (×6): qty 1

## 2018-03-06 MED ORDER — PNEUMOCOCCAL VAC POLYVALENT 25 MCG/0.5ML IJ INJ
0.5000 mL | INJECTION | INTRAMUSCULAR | Status: AC
  Administered 2018-03-07: 0.5 mL via INTRAMUSCULAR
  Filled 2018-03-06: qty 0.5

## 2018-03-06 MED ORDER — IPRATROPIUM-ALBUTEROL 0.5-2.5 (3) MG/3ML IN SOLN
3.0000 mL | Freq: Once | RESPIRATORY_TRACT | Status: AC
  Administered 2018-03-06: 3 mL via RESPIRATORY_TRACT
  Filled 2018-03-06: qty 3

## 2018-03-06 MED ORDER — ACETAMINOPHEN 325 MG PO TABS
650.0000 mg | ORAL_TABLET | Freq: Four times a day (QID) | ORAL | Status: DC | PRN
  Administered 2018-03-06 – 2018-03-08 (×3): 650 mg via ORAL
  Filled 2018-03-06 (×3): qty 2

## 2018-03-06 MED ORDER — OMEGA-3 1000 MG PO CAPS: 1000.0000 mg | ORAL_CAPSULE | Freq: Two times a day (BID) | ORAL | Status: DC

## 2018-03-06 MED ORDER — PANTOPRAZOLE SODIUM 40 MG PO TBEC
40.0000 mg | DELAYED_RELEASE_TABLET | Freq: Every day | ORAL | Status: DC
  Administered 2018-03-06 – 2018-03-09 (×4): 40 mg via ORAL
  Filled 2018-03-06 (×4): qty 1

## 2018-03-06 MED ORDER — GUAIFENESIN ER 600 MG PO TB12
600.0000 mg | ORAL_TABLET | Freq: Two times a day (BID) | ORAL | Status: DC
  Administered 2018-03-06 – 2018-03-09 (×6): 600 mg via ORAL
  Filled 2018-03-06 (×6): qty 1

## 2018-03-06 MED ORDER — ALBUTEROL SULFATE (2.5 MG/3ML) 0.083% IN NEBU
5.0000 mg | INHALATION_SOLUTION | Freq: Once | RESPIRATORY_TRACT | Status: AC
  Administered 2018-03-06: 5 mg via RESPIRATORY_TRACT
  Filled 2018-03-06: qty 6

## 2018-03-06 MED ORDER — ONDANSETRON HCL 4 MG/2ML IJ SOLN: 4.0000 mg | Freq: Four times a day (QID) | INTRAMUSCULAR | Status: DC | PRN

## 2018-03-06 MED ORDER — HEPARIN SODIUM (PORCINE) 5000 UNIT/ML IJ SOLN
5000.0000 [IU] | Freq: Three times a day (TID) | INTRAMUSCULAR | Status: DC
  Administered 2018-03-06 – 2018-03-09 (×9): 5000 [IU] via SUBCUTANEOUS
  Filled 2018-03-06 (×8): qty 1

## 2018-03-06 MED ORDER — ALBUTEROL SULFATE (2.5 MG/3ML) 0.083% IN NEBU
5.0000 mg | INHALATION_SOLUTION | Freq: Once | RESPIRATORY_TRACT | Status: DC
  Filled 2018-03-06: qty 6

## 2018-03-06 MED ORDER — TRAZODONE HCL 100 MG PO TABS
100.0000 mg | ORAL_TABLET | Freq: Every day | ORAL | Status: DC
  Administered 2018-03-06 – 2018-03-08 (×3): 100 mg via ORAL
  Filled 2018-03-06 (×3): qty 1

## 2018-03-06 MED ORDER — TURMERIC CURCUMIN 500 MG PO CAPS: 500.0000 mg | ORAL_CAPSULE | Freq: Two times a day (BID) | ORAL | Status: DC

## 2018-03-06 MED ORDER — ONDANSETRON HCL 4 MG PO TABS: 4.0000 mg | ORAL_TABLET | Freq: Four times a day (QID) | ORAL | Status: DC | PRN

## 2018-03-06 MED ORDER — SODIUM CHLORIDE 0.9 % IV SOLN: INTRAVENOUS | Status: DC | PRN

## 2018-03-06 MED ORDER — METHYLPREDNISOLONE SODIUM SUCC 125 MG IJ SOLR
60.0000 mg | Freq: Four times a day (QID) | INTRAMUSCULAR | Status: DC
  Administered 2018-03-06 – 2018-03-08 (×7): 60 mg via INTRAVENOUS
  Filled 2018-03-06 (×8): qty 2

## 2018-03-06 MED ORDER — IPRATROPIUM-ALBUTEROL 0.5-2.5 (3) MG/3ML IN SOLN
3.0000 mL | Freq: Four times a day (QID) | RESPIRATORY_TRACT | Status: DC
  Administered 2018-03-06 – 2018-03-07 (×5): 3 mL via RESPIRATORY_TRACT
  Filled 2018-03-06 (×5): qty 3

## 2018-03-06 MED ORDER — ALBUTEROL SULFATE (2.5 MG/3ML) 0.083% IN NEBU: 2.5000 mg | INHALATION_SOLUTION | Freq: Four times a day (QID) | RESPIRATORY_TRACT | Status: DC

## 2018-03-06 MED ORDER — LEVOFLOXACIN IN D5W 500 MG/100ML IV SOLN
500.0000 mg | INTRAVENOUS | Status: DC
  Administered 2018-03-06 – 2018-03-08 (×3): 500 mg via INTRAVENOUS
  Filled 2018-03-06 (×4): qty 100

## 2018-03-06 MED ORDER — MOMETASONE FURO-FORMOTEROL FUM 100-5 MCG/ACT IN AERO
2.0000 | INHALATION_SPRAY | Freq: Two times a day (BID) | RESPIRATORY_TRACT | Status: DC
  Administered 2018-03-06 – 2018-03-09 (×6): 2 via RESPIRATORY_TRACT
  Filled 2018-03-06: qty 8.8

## 2018-03-06 MED ORDER — MAGNESIUM SULFATE 2 GM/50ML IV SOLN
2.0000 g | Freq: Once | INTRAVENOUS | Status: AC
  Administered 2018-03-06: 2 g via INTRAVENOUS
  Filled 2018-03-06: qty 50

## 2018-03-06 NOTE — ED Provider Notes (Signed)
[place] EMERGENCY DEPARTMENT Provider Note   CSN: [id number] Arrival date & time: 03/06/18  1139     History   Chief Complaint Chief Complaint  Patient presents with  . Shortness of Breath    HPI Steve Riley is a 67 y.o. male.  lizz  The history is provided by the patient and the EMS personnel. No language interpreter was used.  Shortness of Breath    Steve Riley is a 67 y.o. male who presents to the Emergency Department complaining of sob. Presents to the emergency department complaining of shortness of breath. Over the last two weeks he has increased shortness of breath and cough with mucus production. He denies any fevers, chills, nausea, vomiting, leg swelling or pain. He was seen in urgent care earlier today and they treated him with albuterol and prescribed prednisone as well as steroids. He was transferred to the emergency department by EMS for continued increased work of breathing. He reports feeling significantly improved compared to earlier today. He does have a history of COPD. He is not on oxygen at home. Past Medical History:  Diagnosis Date  . Prostate cancer [facility])     Patient Active Problem List   Diagnosis Date Noted  . COPD exacerbation ([place]) 03/06/2018  . Malignant neoplasm of prostate ([place]) 03/12/2017    Past Surgical History:  Procedure Laterality Date  . BACK SURGERY    . HAND SURGERY    . PROSTATE BIOPSY          Home Medications    Prior to Admission medications   Medication Sig Start Date End Date Taking? Authorizing Provider  albuterol (PROVENTIL HFA;VENTOLIN HFA) 108 (90 Base) MCG/ACT inhaler Inhale 2 puffs into the lungs every 6 (six) hours as needed for wheezing.   Yes [provider]  budesonide-formoterol (SYMBICORT) 80-4.5 MCG/ACT inhaler Inhale 2 puffs into the lungs 2 (two) times daily.   Yes [provider]  ibuprofen (ADVIL,MOTRIN) 800 MG tablet Take 800 mg by mouth every 6 (six)  hours as needed (hip pain).    Yes [provider]  Methylsulfonylmethane (MSM) 1000 MG CAPS Take 1,000 mg by mouth 2 (two) times daily.   Yes [provider]  Omega-3 1000 MG CAPS Take 1 g by mouth 2 (two) times daily.   Yes [provider]  traZODone (DESYREL) 100 MG tablet Take 100 mg by mouth at bedtime.   Yes [provider]  Turmeric Curcumin 500 MG CAPS Take 500 mg by mouth 2 (two) times daily.   Yes [provider]    Family History Family History  Problem Relation Age of Onset  . Cancer Neg Hx     Social History Social History   Tobacco Use  . Smoking status: Former Smoker    Packs/day: 3.00    Years: 54.00    Pack years: 162.00    Types: Cigarettes    Last attempt to quit: 07/11/2016    Years since quitting: 1.6  . Smokeless tobacco: Never Used  Substance Use Topics  . Alcohol use: No  . Drug use: No     Allergies   Penicillin g   Review of Systems Review of Systems  Respiratory: Positive for shortness of breath.   All other systems reviewed and are negative.    Physical Exam Updated Vital Signs BP (!) 144/82   Pulse (!) 103   Temp 98.7 F (37.1 C) (Oral)   Resp 16   Ht 5'  8" (1.727 m)   Wt 98.4 kg   SpO2 98%   BMI 32.99 kg/m   Physical Exam  Constitutional: He is oriented to person, place, and time. He appears well-developed and well-nourished.  HENT:  Head: Normocephalic and atraumatic.  Cardiovascular: Normal rate and regular rhythm.  No murmur heard. Pulmonary/Chest: No respiratory distress.  Increased work of breathing with tachypnea, accessory muscle use.  End expiratory wheezes in all lung fields.  Speaks in short sentences.    Abdominal: Soft. There is no tenderness. There is no rebound and no guarding.  Musculoskeletal: He exhibits no tenderness.  Trace pitting edema to BLE  Neurological: He is alert and oriented to person, place, and time.  Skin: Skin is warm and dry.  Psychiatric: He  has a normal mood and affect. His behavior is normal.  Nursing note and vitals reviewed.    ED Treatments / Results  Labs (all labs ordered are listed, but only abnormal results are displayed) Labs Reviewed  BASIC METABOLIC PANEL - Abnormal; Notable for the following components:      Result Value   Glucose, Bld 124 (*)    All other components within normal limits  I-STAT VENOUS BLOOD GAS, ED - Abnormal; Notable for the following components:   pH, Ven 7.439 (*)    pCO2, Ven 33.8 (*)    All other components within normal limits  CBC  I-STAT TROPONIN, ED    EKG EKG Interpretation  Date/Time:  Friday March 06 2018 11:42:24 EDT Ventricular Rate:  85 PR Interval:    QRS Duration: 100 QT Interval:  381 QTC Calculation: 453 R Axis:   -48 Text Interpretation:  Sinus rhythm Left axis deviation Anteroseptal infarct, age indeterminate Confirmed by Quintella Reichert [phone]) on 03/06/2018 11:55:02 AM Also confirmed by Quintella Reichert [phone]), editor Lynder Parents [phone])  on 03/06/2018 3:45:27 PM   Radiology Dg Chest Portable 1 View  Result Date: 03/06/2018 CLINICAL DATA:  Respiratory distress, chest congestion. EXAM: PORTABLE CHEST 1 VIEW COMPARISON:  None. FINDINGS: Normal heart size. No consolidation or edema. Calcified tortuous aorta chronic markings at the bases. No effusion or pneumothorax. Bones unremarkable except for osteopenia. IMPRESSION: No active disease Electronically Signed   By: Staci Righter M.D.   On: 03/06/2018 13:02    Procedures Procedures (including critical care time)  Medications Ordered in ED Medications  pantoprazole (PROTONIX) EC tablet 40 mg (40 mg Oral Given 03/06/18 1603)  methylPREDNISolone sodium succinate (SOLU-MEDROL) 125 mg/2 mL injection 60 mg (has no administration in time range)  albuterol (PROVENTIL) (2.5 MG/3ML) 0.083% nebulizer solution 5 mg (5 mg Nebulization Given 03/06/18 1222)  ipratropium-albuterol (DUONEB) 0.5-2.5 (3) MG/3ML nebulizer  solution 3 mL (3 mLs Nebulization Given 03/06/18 1222)  magnesium sulfate IVPB 2 g 50 mL (0 g Intravenous Stopped 03/06/18 1244)  albuterol (PROVENTIL) (2.5 MG/3ML) 0.083% nebulizer solution 5 mg (5 mg Nebulization Given 03/06/18 1603)     Initial Impression / Assessment and Plan / ED Course  I have reviewed the triage vital signs and the nursing notes.  Pertinent labs & imaging results that were available during my care of the patient were reviewed by me and considered in my medical decision making (see chart for details).     Pt with history of COPD here for evaluation of shortness of breath. He has increased work of breathing on ED arrival although he states he is significantly improved compared to earlier today. On repeat assessment after magnesium and albuterol his work of breathing has  improved but he does have persistent tachypnea and accessory muscle use with decreased air movement in the bases. Plan to provide additional albuterol and admit for COPD exacerbation. Presentation is not consistent with ACS, pneumonia, PE, CHF. Hospitalist consulted for admission.  Final Clinical Impressions(s) / ED Diagnoses   Final diagnoses:  None    ED Discharge Orders    None       Quintella Reichert, MD 03/06/18 [phone]

## 2018-03-06 NOTE — H&P (Signed)
TRH H&P   Patient Demographics:    Steve Riley, is a 67 y.o. male  MRN: [id number]   DOB - 1951/02/23  Admit Date - 03/06/2018  Outpatient Primary MD for the patient is System, [place] Not In  Referring MD/NP/PA: dr Ralene Bathe  [facility]  Patient coming from: Home  Chief Complaint  Patient presents with  . Shortness of Breath      HPI:    Steve Riley  is a 67 y.o. 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 -NAD chest x-ray with no acute findings, hypoxic on exertion, received IV Solu-Medrol, and nebulizer treatment, remained with diminished air entry and wheezing, so I was called to admit    Review of systems:    In addition to the HPI above,  No Fever-chills, No Headache, No changes with Vision or hearing, No problems swallowing food or Liquids, No Chest pain, reports cough, shortness of breath with a productive sputum  no Abdominal pain, No Nausea or Vommitting, Bowel movements are regular, No Blood in stool or Urine, No dysuria, No new skin rashes or bruises, No new joints pains-aches,  No new weakness, tingling, numbness in any extremity, No recent weight gain or loss, No polyuria, polydypsia or polyphagia, No significant Mental Stressors.  A full 10 point Review of Systems was done, except as stated above, all other Review of Systems were  negative.   With Past History of the following :    Past Medical History:  Diagnosis Date  . Prostate cancer [facility])       Past Surgical History:  Procedure Laterality Date  . BACK SURGERY    . HAND SURGERY    . PROSTATE BIOPSY        Social History:     Social History   Tobacco Use  . Smoking status: Former Smoker    Packs/day: 3.00    Years: 54.00    Pack years: 162.00    Types: Cigarettes    Last attempt to quit: 07/11/2016    Years since quitting: 1.6  . Smokeless tobacco: Never Used  Substance Use Topics  . Alcohol use: No     Lives -at home  Mobility -independent     Family History :     Family History  Problem Relation Age of Onset  . Cancer Neg Hx    Brother died from COPD   Home Medications:   Prior to  Admission medications   Medication Sig Start Date End Date Taking? Authorizing Provider  albuterol (PROVENTIL HFA;VENTOLIN HFA) 108 (90 Base) MCG/ACT inhaler Inhale 2 puffs into the lungs every 6 (six) hours as needed for wheezing.   Yes [provider]  budesonide-formoterol (SYMBICORT) 80-4.5 MCG/ACT inhaler Inhale 2 puffs into the lungs 2 (two) times daily.   Yes [provider]  ibuprofen (ADVIL,MOTRIN) 800 MG tablet Take 800 mg by mouth every 6 (six) hours as needed (hip pain).    Yes [provider]  Methylsulfonylmethane (MSM) 1000 MG CAPS Take 1,000 mg by mouth 2 (two) times daily.   Yes [provider]  Omega-3 1000 MG CAPS Take 1 g by mouth 2 (two) times daily.   Yes [provider]  traZODone (DESYREL) 100 MG tablet Take 100 mg by mouth at bedtime.   Yes [provider]  Turmeric Curcumin 500 MG CAPS Take 500 mg by mouth 2 (two) times daily.   Yes [provider]     Allergies:     Allergies  Allergen Reactions  . Penicillin G Other (See Comments)    "passed out"/childhood allergy     Physical Exam:   Vitals  Blood pressure 134/67, pulse (!) 114, temperature 98.7  F (37.1 C), temperature source Oral, resp. rate 20, height 5\' 8"  (1.727 m), weight 98.4 kg, SpO2 92 %.   1. General well-developed elderly male laying in bed in no apparent distress  2. Normal affect and insight, Not Suicidal or Homicidal, Awake Alert, Oriented X 3.  3. No F.N deficits, ALL C.Nerves Intact, Strength 5/5 all 4 extremities, Sensation intact all 4 extremities, Plantars down going.  4. Ears and Eyes appear Normal, Conjunctivae clear, PERRLA. Moist Oral Mucosa.  5. Supple Neck, No JVD, No cervical lymphadenopathy appriciated, No Carotid Bruits.  6. Symmetrical Chest wall movement, diminished air entry bilaterally with diffuse wheezing  7. RRR, No Gallops, Rubs or Murmurs, No Parasternal Heave.  8. Positive Bowel Sounds, Abdomen Soft, No tenderness, No organomegaly appriciated,No rebound -guarding or rigidity.  9.  No Cyanosis, Normal Skin Turgor, had crusted nodular scattered skin rashes, reports is chronic secondary to his prurigo nodularis  10. Good muscle tone,  joints appear normal , no effusions, Normal ROM.  11. No Palpable Lymph Nodes in Neck or Axillae   Data Review:    CBC Recent Labs  Lab 03/06/18 1206  WBC 9.2  HGB 14.6  HCT 44.7  PLT 286  MCV 90.1  MCH 29.4  MCHC 32.7  RDW 12.2   ------------------------------------------------------------------------------------------------------------------  Chemistries  Recent Labs  Lab 03/06/18 1206  NA 138  K 3.6  CL 102  CO2 23  GLUCOSE 124*  BUN 9  CREATININE 0.85  CALCIUM 9.9   ------------------------------------------------------------------------------------------------------------------ estimated creatinine clearance is 93.3 mL/min (by C-G formula based on SCr of 0.85 mg/dL). ------------------------------------------------------------------------------------------------------------------ No results for input(s): TSH, T4TOTAL, T3FREE, THYROIDAB in the last 72 hours.  Invalid input(s):  FREET3  Coagulation profile No results for input(s): INR, PROTIME in the last 168 hours. ------------------------------------------------------------------------------------------------------------------- No results for input(s): DDIMER in the last 72 hours. -------------------------------------------------------------------------------------------------------------------  Cardiac Enzymes No results for input(s): CKMB, TROPONINI, MYOGLOBIN in the last 168 hours.  Invalid input(s): CK ------------------------------------------------------------------------------------------------------------------ No results found for: BNP   ---------------------------------------------------------------------------------------------------------------  Urinalysis No results found for: COLORURINE, APPEARANCEUR, LABSPEC, [place], GLUCOSEU, HGBUR, BILIRUBINUR, KETONESUR, PROTEINUR, UROBILINOGEN, NITRITE, LEUKOCYTESUR  ----------------------------------------------------------------------------------------------------------------   Imaging Results:    Dg Chest Portable 1 View  Result Date: 03/06/2018  CLINICAL DATA:  Respiratory distress, chest congestion. EXAM: PORTABLE CHEST 1 VIEW COMPARISON:  None. FINDINGS: Normal heart size. No consolidation or edema. Calcified tortuous aorta chronic markings at the bases. No effusion or pneumothorax. Bones unremarkable except for osteopenia. IMPRESSION: No active disease Electronically Signed   By: Staci Righter M.D.   On: 03/06/2018 13:02    My personal review of EKG: Rhythm NSR, Rate 85/min, QTc 453 , no Acute ST changes   Assessment & Plan:    Active Problems:   Malignant neoplasm of prostate ([facility])   COPD exacerbation ([facility])   Prurigo nodularis   COPD exacerbation -Presents with increased work of breathing, wheezing, oxygen on exertion, with a productive yellow phlegm, chest x-ray with no acute findings, will continue with IV Solu-Medrol 60 mg every 8  hours, will continue with PPI for GI prophylaxis, continue with scheduled duo nebs and PRN albuterol, continue with home Symbicort, productive cough will start on Mucinex, incentive spirometry and flutter valve, will continue with IV Levaquin as well during hospital stay.  Malignant neoplasm of the prostate -Going with urology at the [facility]  Prurigo nodularis -Chronic condition, reports that stable followed with dermatology at [facility]   DVT Prophylaxis Heparin -  SCDs  AM Labs Ordered, also please review Full Orders  Family Communication: Admission, patients condition and plan of care including tests being ordered have been discussed with the patient  who indicate understanding and agree with the plan and Code Status.  Code Status FULL  Likely DC to  HOME  Condition GUARDED    Consults called: NONE  Admission status: OBSERVATION  Time spent in minutes : 50 MINUTES   Phillips Climes M.D on 03/06/2018 at 4:32 PM  Between 7am to 7pm - Pager - [phone]. After 7pm go to [email or web address] - password [facility]  [facility] - Office  [phone]

## 2018-03-06 NOTE — Progress Notes (Signed)
PHARMACIST - PHYSICIAN ORDER COMMUNICATION  CONCERNING: P&T Medication Policy on Herbal Medications  DESCRIPTION:  This patient's order for:  Tumeric  has been noted.  This product(s) is classified as an "herbal" or natural product. Due to a lack of definitive safety studies or FDA approval, nonstandard manufacturing practices, plus the potential risk of unknown drug-drug interactions while on inpatient medications, the Pharmacy and Therapeutics Committee does not permit the use of "herbal" or natural products of this type within [place].   ACTION TAKEN: The pharmacy department is unable to verify this order at this time and your patient has been informed of this safety policy. Please reevaluate patient's clinical condition at discharge and address if the herbal or natural product(s) should be resumed at that time.   Dillion Stowers A. Colin Ellers, PharmD, BCPS Clinical Pharmacist [place] Pager: [phone] Please utilize Amion for appropriate phone number to reach the unit pharmacist ([facility])   

## 2018-03-06 NOTE — ED Notes (Signed)
Pt ambulatory in hallway with cane from home. Pt SpO2 93% when starting out on room air, fell to 85% when pt was walking and laughing, stayed around 95% otherwise while ambulating. 98% upon returning to bedside. Pt resting in bed eating [place] sandwich at this time.

## 2018-03-06 NOTE — ED Triage Notes (Signed)
Per EMS pt was at his Dr. Gabriel Carina and they called EMS because he was SOB there he got 125 Solumederol, 20 albuterol and 1 of atrovent.  He states not feeling well over the past 2 weeks.  Currently on 2L of O2 98SPO2.  AOx4

## 2018-03-06 NOTE — Progress Notes (Signed)
New Admission Note: Report taken from Gerald Stabs, RN from the ED  Arrival Method: Stretcher from ED Mental Orientation: Alert and oriented x4 Telemetry: NO Assessment: Completed Skin: Intact IV: Right AC, saline locked Pain: 0 Tubes: None Safety Measures: Safety Fall Prevention Plan has been discussed  Admission:  5 [place] Orientation: Patient has been orientated to the room, unit and staff.   Family: none present  Orders to be reviewed and implemented. Will continue to monitor the patient. Call light has been placed within reach and bed alarm has been activated.   Baldo Ash, RN

## 2018-03-07 DIAGNOSIS — C61 Malignant neoplasm of prostate: Secondary | ICD-10-CM | POA: Diagnosis present

## 2018-03-07 DIAGNOSIS — J441 Chronic obstructive pulmonary disease with (acute) exacerbation: Secondary | ICD-10-CM | POA: Diagnosis present

## 2018-03-07 DIAGNOSIS — E1165 Type 2 diabetes mellitus with hyperglycemia: Secondary | ICD-10-CM | POA: Diagnosis present

## 2018-03-07 DIAGNOSIS — Z87891 Personal history of nicotine dependence: Secondary | ICD-10-CM | POA: Diagnosis not present

## 2018-03-07 DIAGNOSIS — Z7951 Long term (current) use of inhaled steroids: Secondary | ICD-10-CM | POA: Diagnosis not present

## 2018-03-07 DIAGNOSIS — R0600 Dyspnea, unspecified: Secondary | ICD-10-CM | POA: Diagnosis not present

## 2018-03-07 DIAGNOSIS — Z880 Allergy status to penicillin: Secondary | ICD-10-CM | POA: Diagnosis not present

## 2018-03-07 DIAGNOSIS — T380X5A Adverse effect of glucocorticoids and synthetic analogues, initial encounter: Secondary | ICD-10-CM | POA: Diagnosis present

## 2018-03-07 DIAGNOSIS — Z79899 Other long term (current) drug therapy: Secondary | ICD-10-CM | POA: Diagnosis not present

## 2018-03-07 DIAGNOSIS — L281 Prurigo nodularis: Secondary | ICD-10-CM | POA: Diagnosis present

## 2018-03-07 DIAGNOSIS — Z23 Encounter for immunization: Secondary | ICD-10-CM | POA: Diagnosis present

## 2018-03-07 LAB — BASIC METABOLIC PANEL
Anion gap: 8 (ref 5–15)
BUN: 14 mg/dL (ref 8–23)
CO2: 21 mmol/L — AB (ref 22–32)
CREATININE: 0.95 mg/dL (ref 0.61–1.24)
Calcium: 9.5 mg/dL (ref 8.9–10.3)
Chloride: 107 mmol/L (ref 98–111)
GFR calc non Af Amer: >60 mL/min (ref >60)
GLUCOSE: 246 mg/dL — AB (ref 70–99)
Potassium: 4.7 mmol/L (ref 3.5–5.1)
Sodium: 136 mmol/L (ref 135–145)

## 2018-03-07 LAB — CBC
HCT: 41.2 % (ref 39.0–52.0)
Hemoglobin: 13.5 g/dL (ref 13.0–17.0)
MCH: 29.8 pg (ref 26.0–34.0)
MCHC: 32.8 g/dL (ref 30.0–36.0)
MCV: 90.9 fL (ref 78.0–100.0)
PLATELETS: 270 10*3/uL (ref 150–400)
RBC: 4.53 MIL/uL (ref 4.22–5.81)
RDW: 12.4 % (ref 11.5–15.5)
WBC: 12.8 10*3/uL — ABNORMAL HIGH (ref 4.0–10.5)

## 2018-03-07 LAB — HEMOGLOBIN A1C
HEMOGLOBIN A1C: 6.5 % — AB (ref 4.8–5.6)
Mean Plasma Glucose: 139.85 mg/dL

## 2018-03-07 LAB — HIV ANTIBODY (ROUTINE TESTING W REFLEX): HIV SCREEN 4TH GENERATION: NONREACTIVE

## 2018-03-07 LAB — GLUCOSE, CAPILLARY: Glucose-Capillary: 225 mg/dL — ABNORMAL HIGH (ref 70–99)

## 2018-03-07 MED ORDER — IPRATROPIUM-ALBUTEROL 0.5-2.5 (3) MG/3ML IN SOLN
3.0000 mL | Freq: Three times a day (TID) | RESPIRATORY_TRACT | Status: DC
  Administered 2018-03-08 – 2018-03-09 (×5): 3 mL via RESPIRATORY_TRACT
  Filled 2018-03-07 (×5): qty 3

## 2018-03-07 NOTE — Progress Notes (Addendum)
PROGRESS NOTE                                                                                                                                                                                                             Patient Demographics:    Steve Riley, is a 67 y.o. male, DOB - July 03, 1950, IRW:[id number]  Admit date - 03/06/2018   Admitting Physician Albertine Patricia, MD  Outpatient Primary MD for the patient is System, Pcp Not In  LOS - 0   Chief Complaint  Patient presents with  . Shortness of Breath       Brief Narrative    67 y.o. male, with past medical history of COPD, quit smoking last month, state cancer, following with [facility] urology, prurigo nodularis presents with complaints of shortness of breath, cough and wheezing, work-up significant for COPD exacerbation.   Subjective:    Jasin Brazel today has, No headache, No chest pain, No abdominal pain -reports some shortness of breath, cough, denies any fever  Assessment  & Plan :    Active Problems:   Malignant neoplasm of prostate (HCC)   COPD exacerbation (HCC)   Prurigo nodularis  COPD exacerbation -Chest x-ray on admission with no evidence of pneumonia, he does remain with increased work of breathing this a.m., cannot finish full sentences, and occasionally tripoding, so I will continue with IV Solu-Medrol for today, still reports cough with productive of phlegm, continue with IV levofloxacin. -10 you with scheduled duo nebs and PRN albuterol -Encouraged again today to use incentive spirometry and flutter valve, continue with the Mucinex. -He is on 2 L nasal cannula, I have asked the staff to ambulate in the hallway record duration of oxygen.  Hyperglycemia -Most likely related to steroids, I will check hemoglobin A1c, check CBG 4 times daily.  Malignant neoplasm of the prostate -Going with urology at the [facility]  Prurigo nodularis -Chronic condition, reports that  stable followed with dermatology at [facility]     Code Status : Full  Family Communication  : None at bedside  Disposition Plan  : Home once stable, he will remain in the hospital an extra night given he still with significant dyspnea and increased work of breathing and wheezing, start requiring IV steroids, stated to be transitioned to oral prednisone.  Consults  : None  Procedures  :  None  DVT Prophylaxis  :  [place] heparin  Lab Results  Component Value Date   PLT 270 03/07/2018    Antibiotics  :    Anti-infectives (From admission, onward)   Start     Dose/Rate Route Frequency Ordered Stop   03/06/18 1700  levofloxacin (LEVAQUIN) IVPB 500 mg     500 mg 100 mL/hr over 60 Minutes Intravenous Every 24 hours 03/06/18 1625          Objective:   Vitals:   03/07/18 0339 03/07/18 0426 03/07/18 1031 03/07/18 1033  BP:  121/87 (!) 147/77   Pulse:  90 79   Resp: 20 (!) 28 (!) 28   Temp:  97.6 F (36.4 C) 97.9 F (36.6 C)   TempSrc:  Oral Oral   SpO2:  97% 96% 97%  Weight:      Height:        Wt Readings from Last 3 Encounters:  03/06/18 98.4 kg  07/02/17 97.4 kg  03/13/17 95.5 kg     Intake/Output Summary (Last 24 hours) at 03/07/2018 1214 Last data filed at 03/07/2018 0900 Gross per 24 hour  Intake 930 ml  Output 975 ml  Net -45 ml     Physical Exam  Awake Alert, Oriented X 3, sitting at the edge of the bed, boarding when he talks full sentences, increased work of breathing . Symmetrical Chest wall movement, some increased work of breathing, diminished air entry bilaterally, scattered wheezing RRR,No Gallops,Rubs or new Murmurs, No Parasternal Heave +ve B.Sounds, Abd Soft, No tenderness, No organomegaly appriciated, No rebound - guarding or rigidity. No Cyanosis, Clubbing or edema, No new Rash or bruise     Data Review:    CBC Recent Labs  Lab 03/06/18 1206 03/07/18 0543  WBC 9.2 12.8*  HGB 14.6 13.5  HCT 44.7 41.2  PLT 286 270  MCV 90.1 90.9  MCH  29.4 29.8  MCHC 32.7 32.8  RDW 12.2 12.4    Chemistries  Recent Labs  Lab 03/06/18 1206 03/07/18 0543  NA 138 136  K 3.6 4.7  CL 102 107  CO2 23 21*  GLUCOSE 124* 246*  BUN 9 14  CREATININE 0.85 0.95  CALCIUM 9.9 9.5   ------------------------------------------------------------------------------------------------------------------ No results for input(s): CHOL, HDL, LDLCALC, TRIG, CHOLHDL, LDLDIRECT in the last 72 hours.  No results found for: HGBA1C ------------------------------------------------------------------------------------------------------------------ No results for input(s): TSH, T4TOTAL, T3FREE, THYROIDAB in the last 72 hours.  Invalid input(s): FREET3 ------------------------------------------------------------------------------------------------------------------ No results for input(s): VITAMINB12, FOLATE, FERRITIN, TIBC, IRON, RETICCTPCT in the last 72 hours.  Coagulation profile No results for input(s): INR, PROTIME in the last 168 hours.  No results for input(s): DDIMER in the last 72 hours.  Cardiac Enzymes No results for input(s): CKMB, TROPONINI, MYOGLOBIN in the last 168 hours.  Invalid input(s): CK ------------------------------------------------------------------------------------------------------------------ No results found for: BNP  Inpatient Medications  Scheduled Meds: . albuterol  5 mg Nebulization Once  . guaiFENesin  600 mg Oral BID  . heparin  5,000 Units Subcutaneous Q8H  . ipratropium-albuterol  3 mL Nebulization Q6H  . methylPREDNISolone (SOLU-MEDROL) injection  60 mg Intravenous Q6H  . mometasone-formoterol  2 puff Inhalation BID  . omega-3 acid ethyl esters  1 g Oral BID  . pantoprazole  40 mg Oral Daily  . pneumococcal 23 valent vaccine  0.5 mL Intramuscular Tomorrow-1000  . traZODone  100 mg Oral QHS   Continuous Infusions: . sodium chloride    . levofloxacin (LEVAQUIN) IV 500 mg (03/06/18  1838)   PRN Meds:.sodium  chloride, acetaminophen **OR** acetaminophen, bisacodyl, ondansetron **OR** ondansetron (ZOFRAN) IV, polyethylene glycol  Micro Results No results found for this or any previous visit (from the past 240 hour(s)).  Radiology Reports Dg Chest Portable 1 View  Result Date: 03/06/2018 CLINICAL DATA:  Respiratory distress, chest congestion. EXAM: PORTABLE CHEST 1 VIEW COMPARISON:  None. FINDINGS: Normal heart size. No consolidation or edema. Calcified tortuous aorta chronic markings at the bases. No effusion or pneumothorax. Bones unremarkable except for osteopenia. IMPRESSION: No active disease Electronically Signed   By: Staci Righter M.D.   On: 03/06/2018 13:02     Phillips Climes M.D on 03/07/2018 at 12:14 PM  Between 7am to 7pm - Pager - [phone]  After 7pm go to [email or web address] - password [facility]  [facility] -  Office  [phone]

## 2018-03-07 NOTE — Progress Notes (Signed)
SATURATION QUALIFICATIONS: (This note is used to comply with regulatory documentation for home oxygen)  Patient Saturations on Room Air at Rest = 92%  Patient Saturations on Room Air while Ambulating = 89%  Patient Saturations on 2 Liters of oxygen while Ambulating = 93%  Please briefly explain why patient needs home oxygen: Patient admitted with COPD exacerbation, unable to maintain 02 sats above 89% while ambulating on room air

## 2018-03-08 LAB — BASIC METABOLIC PANEL
ANION GAP: 7 (ref 5–15)
BUN: 17 mg/dL (ref 8–23)
CALCIUM: 9.5 mg/dL (ref 8.9–10.3)
CO2: 24 mmol/L (ref 22–32)
CREATININE: 0.77 mg/dL (ref 0.61–1.24)
Chloride: 108 mmol/L (ref 98–111)
GFR calc non Af Amer: >60 mL/min (ref >60)
Glucose, Bld: 194 mg/dL — ABNORMAL HIGH (ref 70–99)
Potassium: 4.5 mmol/L (ref 3.5–5.1)
SODIUM: 139 mmol/L (ref 135–145)

## 2018-03-08 LAB — CBC
HCT: 39.3 % (ref 39.0–52.0)
Hemoglobin: 12.8 g/dL — ABNORMAL LOW (ref 13.0–17.0)
MCH: 29.6 pg (ref 26.0–34.0)
MCHC: 32.6 g/dL (ref 30.0–36.0)
MCV: 90.8 fL (ref 78.0–100.0)
Platelets: 310 10*3/uL (ref 150–400)
RBC: 4.33 MIL/uL (ref 4.22–5.81)
RDW: 12.5 % (ref 11.5–15.5)
WBC: 17.5 10*3/uL — AB (ref 4.0–10.5)

## 2018-03-08 LAB — GLUCOSE, CAPILLARY: GLUCOSE-CAPILLARY: 139 mg/dL — AB (ref 70–99)

## 2018-03-08 MED ORDER — PREDNISONE 20 MG PO TABS
40.0000 mg | ORAL_TABLET | Freq: Every day | ORAL | Status: DC
  Administered 2018-03-08 – 2018-03-09 (×2): 40 mg via ORAL
  Filled 2018-03-08 (×2): qty 2

## 2018-03-08 MED ORDER — HYDROCORTISONE 1 % EX CREA
TOPICAL_CREAM | CUTANEOUS | Status: DC | PRN
  Filled 2018-03-08: qty 28

## 2018-03-08 NOTE — Progress Notes (Signed)
Patient has Steve Riley [facility] Medicare Member ID [id number] Plan ([id number]) [id number] RxBin [id number] RxPCN [id number] [place] [employer] [facility] Coverage  Patient states he gets Rxs from [place] and [place] through [place] coverage.

## 2018-03-08 NOTE — Progress Notes (Signed)
PROGRESS NOTE                                                                                                                                                                                                             Patient Demographics:    Steve Riley, is a 67 y.o. male, DOB - 01-21-1951, [id number]  Admit date - 03/06/2018   Admitting Physician Albertine Patricia, MD  Outpatient Primary MD for the patient is System, Pcp Not In  LOS - 1   Chief Complaint  Patient presents with  . Shortness of Breath       Brief Narrative    67 y.o. male, with past medical history of COPD, quit smoking last month, state cancer, following with [facility] urology, prurigo nodularis presents with complaints of shortness of breath, cough and wheezing, work-up significant for COPD exacerbation.   Subjective:    Carlitos Bottino today has, No headache, No chest pain, No abdominal pain , she is in bed in the hallway with no hypoxia, but he is extremely dyspneic where he had to rest few times.  Assessment  & Plan :    Active Problems:   Malignant neoplasm of prostate (HCC)   COPD exacerbation (HCC)   Prurigo nodularis  COPD exacerbation -Chest x-ray on admission with no evidence of pneumonia, wheezing significantly improved on IV Solu-Medrol, as when he had a productive cough so he was started on IV levofloxacin, reports cough has improved, less productive, as well his wheezing has significantly improved when he is transitioned to oral prednisone . -Continue with a scheduled duo nebs and PRN albuterol . -Patient remains with some significant dyspnea and increased work of breathing, especially on ambulation, discussing with the patient, appears this has been progressive over the last few months, currently dyspneic after walking 50 feet, but on my exam he did not have significant wheezing, was not hypoxic, EKG was with some abnormalities, patient reports he never  had cardiac work-up in the past, so I will start with an echo to evaluate if cardiac origin is the reason for his dyspnea. -Cytosis most likely in the setting of steroids, he is afebrile   Hyperglycemia/new diagnosis of diabetes mellitus type 2 -Once he is 6.6, which is diagnostic of diabetes, I have discussed with the patient, start on carb modified diet  Malignant  neoplasm of the prostate -Going with urology at the [facility]  Prurigo nodularis -Chronic condition, reports that stable followed with dermatology at [facility]     Code Status : Full  Family Communication  : None at bedside  Disposition Plan  : Home once stable,   Consults  : None  Procedures  : None  DVT Prophylaxis  :  [place] heparin  Lab Results  Component Value Date   PLT 310 03/08/2018    Antibiotics  :    Anti-infectives (From admission, onward)   Start     Dose/Rate Route Frequency Ordered Stop   03/06/18 1700  levofloxacin (LEVAQUIN) IVPB 500 mg     500 mg 100 mL/hr over 60 Minutes Intravenous Every 24 hours 03/06/18 1625          Objective:   Vitals:   03/08/18 0751 03/08/18 0758 03/08/18 1014 03/08/18 1327  BP:   (!) 150/74   Pulse:   94   Resp:   18   Temp:   97.7 F (36.5 C)   TempSrc:   Oral   SpO2: 90% 92% 95% 93%  Weight:      Height:        Wt Readings from Last 3 Encounters:  03/06/18 98.4 kg  07/02/17 97.4 kg  03/13/17 95.5 kg     Intake/Output Summary (Last 24 hours) at 03/08/2018 1444 Last data filed at 03/08/2018 1320 Gross per 24 hour  Intake 960 ml  Output 250 ml  Net 710 ml     Physical Exam  Awake Alert, Oriented X 3, No new F.N deficits, Normal affect Symmetrical Chest wall movement, Good air movement bilaterally, do not hear much wheezing today, patient with increased work of breathing and some use of accessory muscle with minimal exertion(I have ambulated him in the hallway) RRR,No Gallops,Rubs or new Murmurs, No Parasternal Heave +ve B.Sounds, Abd Soft, No  tenderness, No rebound - guarding or rigidity. No Cyanosis, Clubbing or edema, No new Rash or bruise       Data Review:    CBC Recent Labs  Lab 03/06/18 1206 03/07/18 0543 03/08/18 0527  WBC 9.2 12.8* 17.5*  HGB 14.6 13.5 12.8*  HCT 44.7 41.2 39.3  PLT 286 270 310  MCV 90.1 90.9 90.8  MCH 29.4 29.8 29.6  MCHC 32.7 32.8 32.6  RDW 12.2 12.4 12.5    Chemistries  Recent Labs  Lab 03/06/18 1206 03/07/18 0543 03/08/18 0527  NA 138 136 139  K 3.6 4.7 4.5  CL 102 107 108  CO2 23 21* 24  GLUCOSE 124* 246* 194*  BUN 9 14 17   CREATININE 0.85 0.95 0.77  CALCIUM 9.9 9.5 9.5   ------------------------------------------------------------------------------------------------------------------ No results for input(s): CHOL, HDL, LDLCALC, TRIG, CHOLHDL, LDLDIRECT in the last 72 hours.  Lab Results  Component Value Date   HGBA1C 6.5 (H) 03/07/2018   ------------------------------------------------------------------------------------------------------------------ No results for input(s): TSH, T4TOTAL, T3FREE, THYROIDAB in the last 72 hours.  Invalid input(s): FREET3 ------------------------------------------------------------------------------------------------------------------ No results for input(s): VITAMINB12, FOLATE, FERRITIN, TIBC, IRON, RETICCTPCT in the last 72 hours.  Coagulation profile No results for input(s): INR, PROTIME in the last 168 hours.  No results for input(s): DDIMER in the last 72 hours.  Cardiac Enzymes No results for input(s): CKMB, TROPONINI, MYOGLOBIN in the last 168 hours.  Invalid input(s): CK ------------------------------------------------------------------------------------------------------------------ No results found for: BNP  Inpatient Medications  Scheduled Meds: . albuterol  5 mg Nebulization Once  . guaiFENesin  600 mg Oral BID  .  heparin  5,000 Units Subcutaneous Q8H  . ipratropium-albuterol  3 mL Nebulization TID  .  mometasone-formoterol  2 puff Inhalation BID  . omega-3 acid ethyl esters  1 g Oral BID  . pantoprazole  40 mg Oral Daily  . predniSONE  40 mg Oral Q breakfast  . traZODone  100 mg Oral QHS   Continuous Infusions: . sodium chloride    . levofloxacin (LEVAQUIN) IV 500 mg (03/07/18 1837)   PRN Meds:.sodium chloride, acetaminophen **OR** acetaminophen, bisacodyl, ondansetron **OR** ondansetron (ZOFRAN) IV, polyethylene glycol  Micro Results No results found for this or any previous visit (from the past 240 hour(s)).  Radiology Reports Dg Chest Portable 1 View  Result Date: 03/06/2018 CLINICAL DATA:  Respiratory distress, chest congestion. EXAM: PORTABLE CHEST 1 VIEW COMPARISON:  None. FINDINGS: Normal heart size. No consolidation or edema. Calcified tortuous aorta chronic markings at the bases. No effusion or pneumothorax. Bones unremarkable except for osteopenia. IMPRESSION: No active disease Electronically Signed   By: Staci Righter M.D.   On: 03/06/2018 13:02     Phillips Climes M.D on 03/08/2018 at 2:44 PM  Between 7am to 7pm - Pager - [phone]  After 7pm go to [email or web address] - password [facility]  [facility] -  Office  [phone]

## 2018-03-08 NOTE — Progress Notes (Signed)
Pt demonstrated knowledge of how to correctly do flutter valve. Explained to pt how many times and how often to do breathing exercise. RT will continue to monitor.

## 2018-03-09 ENCOUNTER — Inpatient Hospital Stay (HOSPITAL_COMMUNITY): Payer: Medicare HMO

## 2018-03-09 DIAGNOSIS — L281 Prurigo nodularis: Secondary | ICD-10-CM

## 2018-03-09 DIAGNOSIS — C61 Malignant neoplasm of prostate: Secondary | ICD-10-CM

## 2018-03-09 DIAGNOSIS — J441 Chronic obstructive pulmonary disease with (acute) exacerbation: Principal | ICD-10-CM

## 2018-03-09 DIAGNOSIS — Z23 Encounter for immunization: Secondary | ICD-10-CM | POA: Diagnosis not present

## 2018-03-09 DIAGNOSIS — R0600 Dyspnea, unspecified: Secondary | ICD-10-CM

## 2018-03-09 LAB — CBC
HCT: 39.9 % (ref 39.0–52.0)
Hemoglobin: 13 g/dL (ref 13.0–17.0)
MCH: 29.5 pg (ref 26.0–34.0)
MCHC: 32.6 g/dL (ref 30.0–36.0)
MCV: 90.7 fL (ref 78.0–100.0)
PLATELETS: 287 10*3/uL (ref 150–400)
RBC: 4.4 MIL/uL (ref 4.22–5.81)
RDW: 12.6 % (ref 11.5–15.5)
WBC: 13.8 10*3/uL — AB (ref 4.0–10.5)

## 2018-03-09 LAB — ECHOCARDIOGRAM COMPLETE
HEIGHTINCHES: 68 in
WEIGHTICAEL: 3472 [oz_av]

## 2018-03-09 LAB — BASIC METABOLIC PANEL
Anion gap: 6 (ref 5–15)
BUN: 20 mg/dL (ref 8–23)
CO2: 24 mmol/L (ref 22–32)
Calcium: 9.2 mg/dL (ref 8.9–10.3)
Chloride: 109 mmol/L (ref 98–111)
Creatinine, Ser: 0.72 mg/dL (ref 0.61–1.24)
GFR calc Af Amer: >60 mL/min (ref >60)
GLUCOSE: 127 mg/dL — AB (ref 70–99)
POTASSIUM: 4.1 mmol/L (ref 3.5–5.1)
Sodium: 139 mmol/L (ref 135–145)

## 2018-03-09 LAB — GLUCOSE, CAPILLARY
GLUCOSE-CAPILLARY: 104 mg/dL — AB (ref 70–99)
GLUCOSE-CAPILLARY: 86 mg/dL (ref 70–99)

## 2018-03-09 MED ORDER — PERFLUTREN LIPID MICROSPHERE
1.0000 mL | INTRAVENOUS | Status: AC | PRN
  Administered 2018-03-09: 2 mL via INTRAVENOUS
  Filled 2018-03-09: qty 10

## 2018-03-09 MED ORDER — PREDNISONE 10 MG (21) PO TBPK: ORAL_TABLET | ORAL | 0 refills | Status: DC

## 2018-03-09 MED ORDER — LEVOFLOXACIN 500 MG PO TABS: 500.0000 mg | ORAL_TABLET | ORAL | 0 refills | Status: DC

## 2018-03-09 MED ORDER — GUAIFENESIN ER 600 MG PO TB12: 600.0000 mg | ORAL_TABLET | Freq: Two times a day (BID) | ORAL | 0 refills | Status: DC

## 2018-03-09 MED ORDER — PANTOPRAZOLE SODIUM 40 MG PO TBEC: 40.0000 mg | DELAYED_RELEASE_TABLET | Freq: Every day | ORAL | 0 refills | Status: DC

## 2018-03-09 MED ORDER — IPRATROPIUM-ALBUTEROL 0.5-2.5 (3) MG/3ML IN SOLN: 3.0000 mL | Freq: Four times a day (QID) | RESPIRATORY_TRACT | 0 refills | Status: DC | PRN

## 2018-03-09 MED ORDER — ACETAMINOPHEN 325 MG PO TABS: 650.0000 mg | ORAL_TABLET | Freq: Four times a day (QID) | ORAL | Status: DC | PRN

## 2018-03-09 MED ORDER — LEVOFLOXACIN 500 MG PO TABS: 500.0000 mg | ORAL_TABLET | ORAL | Status: DC

## 2018-03-09 NOTE — Progress Notes (Signed)
Patient discharged to home. Patient AVS reviewed and signed. Patient capable re-verbalizing medications and follow-up appointments. IV removed. Patient belongings sent with patient. Patient educated to return to the ED in the event of SOB, chest pain or dizziness.   Evola Hollis B. RN 

## 2018-03-09 NOTE — Care Management Note (Addendum)
Case Management Note  Patient Details  Name: Steve Riley MRN: [id number] Date of Birth: Sep 27, 1950  Subjective/Objective:                    Action/Plan:Faxed discharge summary to [facility]  Patient's PCP is DR Windy Fast at [facility] 1 [phone] . Called same spoke with Erenest Rasher requesting discharging MD to put in discharge summary patient needs Inpatient Pulmonary Rehab and NCM fax to [phone] , Dr Sherral Hammers will review and approve. Ordered NEB machine from [facility] . NEB machine will be delivered to patient's room before discharge.   Patient aware of all of above and voiced understanding. Expected Discharge Date:  03/09/18               Expected Discharge Plan:  Home/Self Care  In-House Referral:     Discharge planning Services  CM Consult  Post Acute Care Choice:  Durable Medical Equipment Choice offered to:  Patient  DME Arranged:  Nebulizer machine DME Agency:  [place]:  NA [place] Agency:  NA  Status of Service:  Completed, signed off  If discussed at [place] of Stay Meetings, dates discussed:    Additional Comments:  Marilu Favre, RN 03/09/2018, 1:39 PM

## 2018-03-09 NOTE — Discharge Summary (Addendum)
Steve Riley, is a 67 y.o. male  DOB 11/06/1950  MRN [id number].  Admission date:  03/06/2018  Admitting Physician  Albertine Patricia, MD  Discharge Date:  03/09/2018   Primary MD  System, Pcp Not In  Recommendations for primary care physician for things to follow:  -Patient will need referral to pulmonary rehab as an outpatient secondary to significant deconditioning. -Please check CBC, BMP during next visit, continue counseling about diabetes mellitus, and start medications if needed  Admission Diagnosis  difficulty breathing   Discharge Diagnosis  difficulty breathing    Active Problems:   Malignant neoplasm of prostate ([place])   COPD exacerbation ([place])   Prurigo nodularis      Past Medical History:  Diagnosis Date  . COPD (chronic obstructive pulmonary disease) ([place])   . Prostate cancer [facility])     Past Surgical History:  Procedure Laterality Date  . BACK SURGERY    . HAND SURGERY    . PROSTATE BIOPSY         History of present illness and  Hospital Course:     Kindly see H&P for history of present illness and admission details, please review complete Labs, Consult reports and Test reports for all details in brief  HPI  from the history and physical done on the day of admission 03/06/2018   Steve Riley  is a 67 y.o. male, with past medical history of COPD, quit smoking last month, state cancer, following with [facility] urology, prurigo nodularis presents with complaints of shortness of breath, reports he was at [facility] ED last month, discharged on prednisone taper, reports he felt better, but he does report over the last 4 to 5 days he is with a progressive dyspnea, using his inhaler more often, has some wheezing, congestion, productive cough with yellow phlegm, denies fever, chills, hemoptysis, nausea or vomiting, no dysuria or polyuria. -NAD chest x-ray with no acute  findings, hypoxic on exertion, received IV Solu-Medrol, and nebulizer treatment, remained with diminished air entry and wheezing, so I was called to admit  Hospital Course   COPD exacerbation -Chest x-ray on admission with no evidence of pneumonia, wheezing significantly improved on IV Solu-Medrol, as when he had a productive cough so he was started on IV levofloxacin, reports cough has improved, less productive, will be discharged on prednisone taper, and another 2 days of oral levofloxacin, as well he will be discharged with PRN DuoNeb's, case manager to arrange for home nebs . -Patient ambulated in the hallway, was significantly dyspneic, but did not desaturate, did not have significant wheezing, so a 2D echo was obtained to ensure good EF, and no regional wall motion abnormality, 2D echo was obtained, overall poor acoustic window, but he had preserved EF, he never had any complaints of chest pain . -Patient with significant deconditioning and increased work of breathing on minimal exertion, he will need pulmonary rehab as an outpatient, please consider referral.   Hyperglycemia/new diagnosis of diabetes mellitus type 2 -Globin A1c is 6.5 which is  diagnostic of diabetes, I have discussed with the patient about diet compliance.  Malignant neoplasm of the prostate -Going with urology at the [facility]  Prurigo nodularis -Chronic condition, reports that stable followed with dermatology at [facility]     Discharge Condition:  Stable   Follow UP      Discharge Instructions  and  Discharge Medications    Discharge Instructions    Discharge instructions   Complete by:  As directed    Follow with Primary MD  in 7 days   Get CBC, CMP, checked  by Primary MD next visit.    Activity: As tolerated with Full fall precautions use walker/cane & assistance as needed   Disposition Home    Diet: Heart Healthy , carbohydrate modified diet, with feeding assistance and aspiration  precautions.  For Heart failure patients - Check your Weight same time everyday, if you gain over 2 pounds, or you develop in leg swelling, experience more shortness of breath or chest pain, call your Primary MD immediately. Follow Cardiac Low Salt Diet and 1.5 lit/day fluid restriction.   On your next visit with your primary care physician please Get Medicines reviewed and adjusted.   Please request your Prim.MD to go over all Hospital Tests and Procedure/Radiological results at the follow up, please get all Hospital records sent to your Prim MD by signing hospital release before you go home.   If you experience worsening of your admission symptoms, develop shortness of breath, life threatening emergency, suicidal or homicidal thoughts you must seek medical attention immediately by calling 911 or calling your MD immediately  if symptoms less severe.  You Must read complete instructions/literature along with all the possible adverse reactions/side effects for all the Medicines you take and that have been prescribed to you. Take any new Medicines after you have completely understood and accpet all the possible adverse reactions/side effects.   Do not drive, operating heavy machinery, perform activities at heights, swimming or participation in water activities or provide baby sitting services if your were admitted for syncope or siezures until you have seen by Primary MD or a Neurologist and advised to do so again.  Do not drive when taking Pain medications.    Do not take more than prescribed Pain, Sleep and Anxiety Medications  Special Instructions: If you have smoked or chewed Tobacco  in the last 2 yrs please stop smoking, stop any regular Alcohol  and or any Recreational drug use.  Wear Seat belts while driving.   Please note  You were cared for by a hospitalist during your hospital stay. If you have any questions about your discharge medications or the care you received while you  were in the hospital after you are discharged, you can call the unit and asked to speak with the hospitalist on call if the hospitalist that took care of you is not available. Once you are discharged, your primary care physician will handle any further medical issues. Please note that NO REFILLS for any discharge medications will be authorized once you are discharged, as it is imperative that you return to your primary care physician (or establish a relationship with a primary care physician if you do not have one) for your aftercare needs so that they can reassess your need for medications and monitor your lab values.   Increase activity slowly   Complete by:  As directed      Allergies as of 03/09/2018      Reactions   Penicillin G  Other (See Comments)   "passed out"/childhood allergy      Medication List    STOP taking these medications   ibuprofen 800 MG tablet Commonly known as:  ADVIL,MOTRIN     TAKE these medications   acetaminophen 325 MG tablet Commonly known as:  TYLENOL Take 2 tablets (650 mg total) by mouth every 6 (six) hours as needed for mild pain (or Fever >/= 101).   albuterol 108 (90 Base) MCG/ACT inhaler Commonly known as:  PROVENTIL HFA;VENTOLIN HFA Inhale 2 puffs into the lungs every 6 (six) hours as needed for wheezing.   guaiFENesin 600 MG 12 hr tablet Commonly known as:  MUCINEX Take 1 tablet (600 mg total) by mouth 2 (two) times daily.   ipratropium-albuterol 0.5-2.5 (3) MG/3ML Soln Commonly known as:  DUONEB Take 3 mLs by nebulization every 6 (six) hours as needed (wheezing).   levofloxacin 500 MG tablet Commonly known as:  LEVAQUIN Take 1 tablet (500 mg total) by mouth daily. Start taking on:  03/10/2018   MSM 1000 MG Caps Take 1,000 mg by mouth 2 (two) times daily.   Omega-3 1000 MG Caps Take 1 g by mouth 2 (two) times daily.   predniSONE 10 MG (21) Tbpk tablet Commonly known as:  STERAPRED UNI-PAK 21 TAB Use per package instruction    SYMBICORT 80-4.5 MCG/ACT inhaler Generic drug:  budesonide-formoterol Inhale 2 puffs into the lungs 2 (two) times daily.   traZODone 100 MG tablet Commonly known as:  DESYREL Take 100 mg by mouth at bedtime.   Turmeric Curcumin 500 MG Caps Take 500 mg by mouth 2 (two) times daily.         Diet and Activity recommendation: See Discharge Instructions above   Consults obtained -  None   Major procedures and Radiology Reports - PLEASE review detailed and final reports for all details, in brief -     Dg Chest Portable 1 View  Result Date: 03/06/2018 CLINICAL DATA:  Respiratory distress, chest congestion. EXAM: PORTABLE CHEST 1 VIEW COMPARISON:  None. FINDINGS: Normal heart size. No consolidation or edema. Calcified tortuous aorta chronic markings at the bases. No effusion or pneumothorax. Bones unremarkable except for osteopenia. IMPRESSION: No active disease Electronically Signed   By: Staci Righter M.D.   On: 03/06/2018 13:02    Micro Results    No results found for this or any previous visit (from the past 240 hour(s)).     Today   Subjective:   Yitzchak Kothari today has no headache,no chest or abdominal pain, ports is feeling better today, asking when he can go home, reported dyspnea has improved and back to baseline .  Objective:   Blood pressure 132/67, pulse 62, temperature 97.9 F (36.6 C), temperature source Oral, resp. rate 18, height 5\' 8"  (1.727 m), weight 98.4 kg, SpO2 95 %.   Intake/Output Summary (Last 24 hours) at 03/09/2018 1328 Last data filed at 03/09/2018 0851 Gross per 24 hour  Intake 360 ml  Output 900 ml  Net -540 ml    Exam Awake Alert, Oriented x 3, No new F.N deficits, Normal affect Symmetrical Chest wall movement, Good air movement bilaterally, no wheezing or use of accessory muscles today RRR,No Gallops,Rubs or new Murmurs, No Parasternal Heave +ve B.Sounds, Abd Soft, Non tender, No organomegaly appriciated, No rebound -guarding or  rigidity. No Cyanosis, Clubbing or edema, No new Rash or bruise  Data Review   CBC w Diff:  Lab Results  Component Value Date  WBC 13.8 (H) 03/09/2018   HGB 13.0 03/09/2018   HCT 39.9 03/09/2018   PLT 287 03/09/2018    CMP:  Lab Results  Component Value Date   NA 139 03/09/2018   K 4.1 03/09/2018   CL 109 03/09/2018   CO2 24 03/09/2018   BUN 20 03/09/2018   CREATININE 0.72 03/09/2018  .   Total Time in preparing paper work, data evaluation and todays exam - 3 minutes  Phillips Climes M.D on 03/09/2018 at 1:28 PM  [facility]   Office  [phone]

## 2018-03-09 NOTE — Discharge Instructions (Signed)
Follow with Primary MD  in 7 days   Get CBC, CMP, checked  by Primary MD next visit.    Activity: As tolerated with Full fall precautions use walker/cane & assistance as needed   Disposition Home    Diet: Heart Healthy , carbohydrate modified diet, with feeding assistance and aspiration precautions.  For Heart failure patients - Check your Weight same time everyday, if you gain over 2 pounds, or you develop in leg swelling, experience more shortness of breath or chest pain, call your Primary MD immediately. Follow Cardiac Low Salt Diet and 1.5 lit/day fluid restriction.   On your next visit with your primary care physician please Get Medicines reviewed and adjusted.   Please request your Prim.MD to go over all Hospital Tests and Procedure/Radiological results at the follow up, please get all Hospital records sent to your Prim MD by signing hospital release before you go home.   If you experience worsening of your admission symptoms, develop shortness of breath, life threatening emergency, suicidal or homicidal thoughts you must seek medical attention immediately by calling 911 or calling your MD immediately  if symptoms less severe.  You Must read complete instructions/literature along with all the possible adverse reactions/side effects for all the Medicines you take and that have been prescribed to you. Take any new Medicines after you have completely understood and accpet all the possible adverse reactions/side effects.   Do not drive, operating heavy machinery, perform activities at heights, swimming or participation in water activities or provide baby sitting services if your were admitted for syncope or siezures until you have seen by Primary MD or a Neurologist and advised to do so again.  Do not drive when taking Pain medications.    Do not take more than prescribed Pain, Sleep and Anxiety Medications  Special Instructions: If you have smoked or chewed Tobacco  in the last 2  yrs please stop smoking, stop any regular Alcohol  and or any Recreational drug use.  Wear Seat belts while driving.   Please note  You were cared for by a hospitalist during your hospital stay. If you have any questions about your discharge medications or the care you received while you were in the hospital after you are discharged, you can call the unit and asked to speak with the hospitalist on call if the hospitalist that took care of you is not available. Once you are discharged, your primary care physician will handle any further medical issues. Please note that NO REFILLS for any discharge medications will be authorized once you are discharged, as it is imperative that you return to your primary care physician (or establish a relationship with a primary care physician if you do not have one) for your aftercare needs so that they can reassess your need for medications and monitor your lab values.

## 2018-03-09 NOTE — Progress Notes (Signed)
  Echocardiogram 2D Echocardiogram has been performed.  Extremely difficult study due to poor windows and patient coughing entire exam.   Faydra Korman L Androw 03/09/2018, 12:20 PM

## 2018-04-01 ENCOUNTER — Telehealth (HOSPITAL_COMMUNITY): Payer: Self-pay

## 2018-04-01 NOTE — Telephone Encounter (Signed)
Attempted to contact pt 2x in regards to Pulmonary Rehab - "Call could not be completed at this time" did not ring. Sending letter.

## 2018-04-02 ENCOUNTER — Telehealth (HOSPITAL_COMMUNITY): Payer: Self-pay

## 2018-04-02 NOTE — Telephone Encounter (Signed)
Patient returned PR phone call and patient will come in for orientation on 04/24/18 @ 9:30AM and will attend the 1:30PM exercise class.  Mailed homework package.

## 2018-04-08 DIAGNOSIS — J441 Chronic obstructive pulmonary disease with (acute) exacerbation: Secondary | ICD-10-CM | POA: Diagnosis not present

## 2018-04-21 ENCOUNTER — Telehealth (HOSPITAL_COMMUNITY): Payer: Self-pay

## 2018-04-24 ENCOUNTER — Encounter (HOSPITAL_COMMUNITY): Payer: Self-pay

## 2018-04-24 ENCOUNTER — Ambulatory Visit (HOSPITAL_COMMUNITY): Payer: Self-pay

## 2018-04-24 ENCOUNTER — Encounter (HOSPITAL_COMMUNITY)
Admission: RE | Admit: 2018-04-24 | Discharge: 2018-04-24 | Disposition: A | Discharge status: Home / Self Care | Payer: No Typology Code available for payment source | Source: Ambulatory Visit | Attending: Pulmonary Disease | Admitting: Pulmonary Disease

## 2018-04-24 VITALS — BP 123/58 | HR 74 | Temp 98.2°F | Resp 24 | Ht 67.75 in | Wt 200.6 lb

## 2018-04-24 DIAGNOSIS — C61 Malignant neoplasm of prostate: Secondary | ICD-10-CM | POA: Insufficient documentation

## 2018-04-24 DIAGNOSIS — J449 Chronic obstructive pulmonary disease, unspecified: Secondary | ICD-10-CM | POA: Insufficient documentation

## 2018-04-24 DIAGNOSIS — Z79899 Other long term (current) drug therapy: Secondary | ICD-10-CM | POA: Insufficient documentation

## 2018-04-24 NOTE — Progress Notes (Signed)
Steve Riley 67 y.o. male Pulmonary Rehab Orientation Note Steve Riley,a veteran who has [place] authorization [id number] 04/24/18 to 08/22/18 to participate in Pulmonary Rehab referred by his primary MD Dr. Sherral Hammers for COPD. Pt arrived today in Cardiac and Pulmonary Rehab for orientation to Pulmonary Rehab. He was transported from [employer] via wheel chair. 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  Pt ambulates in his home but for long distances he uses a wheelchair.  Noted that pt gait was much improved when using a rolator verses single prong cane. He does not carry portable oxygen. Per pt, he uses oxygen never. Color good, skin warm and dry. Patient is oriented to time and place. Patient's medical history, psychosocial health, and medications reviewed. Psychosocial assessment reveals pt lives alone. Pt is currently retired. Pt hobbies include watching TV. Pt reports his stress level is moderate. Areas of stress/anxiety include Family Finances. Pt is estranged from his daughter for the last 4 years.  Pt has limited income for unexpected expenses.  Pt drives a old [employer] and it frequently breaks down.  Pt can not afford to purchase a newer vehicle. Pt is tearful during orientation which he states more than once that this is not like him.  Pt exhibits signs of depression. Signs of depression include crying spells, guilt, helplessness, hopelessness and sadness.  Pt had a very difficult childhood. PHQ2/9 score 2/7. Pt shows fair  coping skills with mostly positive outlook . Budd offered emotional support and reassurance. Will continue to monitor and evaluate progress toward psychosocial goal(s) of decrease anxiety, depression and stress through healthy means.. Physical assessment reveals heart rate is normal, breath sounds diminished. Grip strength equal, strong. Distal pulses palpable with no swelling. Patient reports he does take medications as prescribed. Patient states he follows a Regular  diet. The patient reports no specific efforts to gain or lose weight. Pt would like to lose weight to help with his breathing. Patient's weight will be monitored closely. Demonstration and practice of PLB using pulse oximeter. Patient able to return demonstration satisfactorily. Safety and hand hygiene in the exercise area reviewed with patient. Patient voices understanding of the information reviewed. Department expectations discussed with patient and achievable goals were set. The patient shows enthusiasm about attending the program and we look forward to working with this nice gentleman. The patient is scheduled for a 6 min walk test on 11/19 and to begin exercise on 11/26. 45 minutes was spent on a variety of activities such as assessment of the patient, obtaining baseline data including height, weight, BMI, and grip strength, verifying medical history, allergies, and current medications, and teaching patient strategies for performing tasks with less respiratory effort with emphasis on pursed lip breathing. 0930 -[place], BSN Cardiac and Training and development officer

## 2018-04-28 ENCOUNTER — Encounter (HOSPITAL_COMMUNITY)
Admission: RE | Admit: 2018-04-28 | Discharge: 2018-04-28 | Disposition: A | Discharge status: Home / Self Care | Payer: No Typology Code available for payment source | Source: Ambulatory Visit | Attending: Pulmonary Disease | Admitting: Pulmonary Disease

## 2018-04-28 ENCOUNTER — Encounter (HOSPITAL_COMMUNITY): Payer: Self-pay | Admitting: *Deleted

## 2018-04-28 DIAGNOSIS — J449 Chronic obstructive pulmonary disease, unspecified: Secondary | ICD-10-CM

## 2018-04-28 DIAGNOSIS — C61 Malignant neoplasm of prostate: Secondary | ICD-10-CM | POA: Diagnosis not present

## 2018-04-28 DIAGNOSIS — Z79899 Other long term (current) drug therapy: Secondary | ICD-10-CM | POA: Diagnosis not present

## 2018-04-30 NOTE — Progress Notes (Signed)
Pulmonary Individual Treatment Plan  Patient Details  Name: Steve Riley MRN: [id number] Date of Birth: [birth year] Referring Provider:     Pulmonary Rehab Walk Test from 04/28/2018 in [place]  Referring Provider  Dr. Nelda Marseille      Initial Encounter Date:    Pulmonary Rehab Walk Test from 04/28/2018 in [place]  Date  04/28/18      Visit Diagnosis: Chronic obstructive pulmonary disease, unspecified COPD type ([place])  Patient's Home Medications on Admission:   Current Outpatient Medications:  .  acetaminophen (TYLENOL) 325 MG tablet, Take 2 tablets (650 mg total) by mouth every 6 (six) hours as needed for mild pain (or Fever >/= 101)., Disp: , Rfl:  .  albuterol (PROVENTIL HFA;VENTOLIN HFA) 108 (90 Base) MCG/ACT inhaler, Inhale 2 puffs into the lungs every 6 (six) hours as needed for wheezing., Disp: , Rfl:  .  Ascorbic Acid (VITAMIN C WITH ROSE HIPS) 1000 MG tablet, Take 1,000 mg by mouth daily., Disp: , Rfl:  .  B Complex-C-Folic Acid (HM SUPER VITAMIN B COMPLEX/C) TABS, Take by mouth., Disp: , Rfl:  .  budesonide-formoterol (SYMBICORT) 80-4.5 MCG/ACT inhaler, Inhale 2 puffs into the lungs 2 (two) times daily., Disp: , Rfl:  .  Calcium Carbonate-Vitamin D (CALCIUM 600/VITAMIN D) 600-400 MG-UNIT chew tablet, Chew 1 tablet by mouth daily., Disp: , Rfl:  .  Flurandrenolide 4 MCG/SQCM TAPE, Apply topically., Disp: , Rfl:  .  ipratropium-albuterol (DUONEB) 0.5-2.5 (3) MG/3ML SOLN, Take 3 mLs by nebulization every 6 (six) hours as needed (wheezing)., Disp: 360 mL, Rfl: 0 .  meloxicam (MOBIC) 15 MG tablet, Take 15 mg by mouth daily., Disp: , Rfl:  .  Methylsulfonylmethane (MSM) 1000 MG CAPS, Take 1,000 mg by mouth 2 (two) times daily., Disp: , Rfl:  .  Multiple Vitamins-Minerals (CENTRUM SILVER) tablet, Take 1 tablet by mouth daily., Disp: , Rfl:  .  Omega-3 1000 MG CAPS, Take 1 g by mouth 2 (two) times daily., Disp: , Rfl:   .  Thiamine HCl (VITAMIN B-1) 250 MG tablet, Take 250 mg by mouth daily., Disp: , Rfl:  .  traZODone (DESYREL) 100 MG tablet, Take 100 mg by mouth at bedtime., Disp: , Rfl:  .  Turmeric Curcumin 500 MG CAPS, Take 500 mg by mouth 2 (two) times daily., Disp: , Rfl:  .  guaiFENesin (MUCINEX) 600 MG 12 hr tablet, Take 1 tablet (600 mg total) by mouth 2 (two) times daily. (Patient not taking: Reported on 04/28/2018), Disp: 20 tablet, Rfl: 0 .  levofloxacin (LEVAQUIN) 500 MG tablet, Take 1 tablet (500 mg total) by mouth daily. (Patient not taking: Reported on 04/28/2018), Disp: 2 tablet, Rfl: 0 .  pantoprazole (PROTONIX) 40 MG tablet, Take 1 tablet (40 mg total) by mouth daily. (Patient not taking: Reported on 04/28/2018), Disp: 30 tablet, Rfl: 0 .  predniSONE (STERAPRED UNI-PAK 21 TAB) 10 MG (21) TBPK tablet, Use per package instruction (Patient not taking: Reported on 04/28/2018), Disp: 21 tablet, Rfl: 0  Past Medical History: Past Medical History:  Diagnosis Date  . COPD (chronic obstructive pulmonary disease) ([place])   . Prostate cancer ([place])     Tobacco Use: Social History   Tobacco Use  Smoking Status Former Smoker  . Packs/day: 3.00  . Years: 54.00  . Pack years: 162.00  . Types: Cigarettes  . Last attempt to quit: 07/11/2016  . Years since quitting: 1.8  Smokeless Tobacco Never Used  Labs: Recent Review Flowsheet Data    Labs for ITP Cardiac and Pulmonary Rehab Latest Ref Rng & Units 03/06/2018 03/07/2018   Hemoglobin A1c 4.8 - 5.6 % - 6.5(H)   HCO3 20.0 - 28.0 mmol/L 22.9 -   TCO2 22 - 32 mmol/L 24 -   ACIDBASEDEF 0.0 - 2.0 mmol/L 1.0 -   O2SAT % 81.0 -      Capillary Blood Glucose: Lab Results  Component Value Date   GLUCAP 86 03/09/2018   GLUCAP 104 (H) 03/09/2018   GLUCAP 139 (H) 03/08/2018   GLUCAP 225 (H) 03/07/2018     Pulmonary Assessment Scores: Pulmonary Assessment Scores    Row Name 04/28/18 1629 04/30/18 0705       ADL UCSD   ADL Phase  Entry  Entry     SOB Score total  74  -      CAT Score   CAT Score  21  -      mMRC Score   mMRC Score  -  3       Pulmonary Function Assessment:   Exercise Target Goals: Exercise Program Goal: Individual exercise prescription set using results from initial 6 min walk test and THRR while considering  patient's activity barriers and safety.   Exercise Prescription Goal: Initial exercise prescription builds to 30-45 minutes a day of aerobic activity, 2-3 days per week.  Home exercise guidelines will be given to patient during program as part of exercise prescription that the participant will acknowledge.  Activity Barriers & Risk Stratification: Activity Barriers & Cardiac Risk Stratification - 04/24/18 1031      Activity Barriers & Cardiac Risk Stratification   Activity Barriers  Balance Concerns;Deconditioning;Assistive Device;Shortness of Breath;Muscular Weakness;Back Problems   right hip discomfort necrotic femur      6 Minute Walk: 6 Minute Walk    Row Name 04/30/18 0705         6 Minute Walk   Phase  Initial     Distance  500 feet     Walk Time  3.5 minutes     # of Rest Breaks  1     MPH  0.95     METS  1.77     RPE  11     Perceived Dyspnea   1     Symptoms  Yes (comment)     Comments  Pt was in the restroom for the last 2.5 minutes of test. I would have put O2 on him if not, as he desaturated down to 83% on RA. Pt reported low perceptual values although he was visiablly out of breath even 5 minutes post exercise.      Resting HR  70 bpm     Resting BP  124/82     Resting Oxygen Saturation   95 %     Exercise Oxygen Saturation  during 6 min walk  83 %     Max Ex. HR  94 bpm     Max Ex. BP  144/72     2 Minute Post BP  116/78       Interval HR   1 Minute HR  88     2 Minute HR  92     3 Minute HR  92     6 Minute HR  94     2 Minute Post HR  74     Interval Heart Rate?  Yes       Interval Oxygen   Interval  Oxygen?  Yes     Baseline Oxygen Saturation %  95 %      1 Minute Oxygen Saturation %  93 %     1 Minute Liters of Oxygen  0 L     2 Minute Oxygen Saturation %  87 %     2 Minute Liters of Oxygen  0 L     3 Minute Oxygen Saturation %  83 %     3 Minute Liters of Oxygen  0 L     6 Minute Oxygen Saturation %  90 %     6 Minute Liters of Oxygen  0 L     2 Minute Post Oxygen Saturation %  96 %     2 Minute Post Liters of Oxygen  0 L        Oxygen Initial Assessment: Oxygen Initial Assessment - 04/30/18 0703      Home Oxygen   Home Oxygen Device  None    Sleep Oxygen Prescription  None    Home Exercise Oxygen Prescription  None    Home at Rest Exercise Oxygen Prescription  None      Initial 6 min Walk   Oxygen Used  None      Program Oxygen Prescription   Program Oxygen Prescription  E-Tanks    Liters per minute  2    Comments  During walk test on RA, pt desaturated down to 83%. I was going to put O2 on pt for the remainder of walk test, but pt went to the rest room for the rest of the test. Will start at 2L, but may have to increase.      Intervention   Short Term Goals  To learn and exhibit compliance with exercise, home and travel O2 prescription;To learn and understand importance of maintaining oxygen saturations>88%;To learn and demonstrate proper use of respiratory medications;To learn and understand importance of monitoring SPO2 with pulse oximeter and demonstrate accurate use of the pulse oximeter.;To learn and demonstrate proper pursed lip breathing techniques or other breathing techniques.    Long  Term Goals  Verbalizes importance of monitoring SPO2 with pulse oximeter and return demonstration;Exhibits proper breathing techniques, such as pursed lip breathing or other method taught during program session;Exhibits compliance with exercise, home and travel O2 prescription;Maintenance of O2 saturations>88%;Compliance with respiratory medication;Demonstrates proper use of MDI's       Oxygen Re-Evaluation:   Oxygen Discharge  (Final Oxygen Re-Evaluation):   Initial Exercise Prescription: Initial Exercise Prescription - 04/30/18 0700      Date of Initial Exercise RX and Referring Provider   Date  04/28/18    Referring Provider  Dr. Nelda Marseille      Oxygen   Oxygen  Continuous    Liters  2      NuStep   Level  2    SPM  80    Minutes  17      Arm Ergometer   Level  2    Minutes  17      Track   Laps  8    Minutes  17      Prescription Details   Frequency (times per week)  2    Duration  Progress to 45 minutes of aerobic exercise without signs/symptoms of physical distress      Intensity   THRR 40-80% of Max Heartrate  60-121    Ratings of Perceived Exertion  11-13    Perceived Dyspnea  0-4  Progression   Progression  Continue to progress workloads to maintain intensity without signs/symptoms of physical distress.      Resistance Training   Training Prescription  Yes    Weight  blue bands    Reps  10-15       Perform Capillary Blood Glucose checks as needed.  Exercise Prescription Changes:   Exercise Comments:   Exercise Goals and Review: Exercise Goals    Row Name 04/24/18 1029             Exercise Goals   Increase Physical Activity  Yes       Intervention  Provide advice, education, support and counseling about physical activity/exercise needs.;Develop an individualized exercise prescription for aerobic and resistive training based on initial evaluation findings, risk stratification, comorbidities and participant's personal goals.       Expected Outcomes  Short Term: Attend rehab on a regular basis to increase amount of physical activity.;Long Term: Exercising regularly at least 3-5 days a week.;Long Term: Add in home exercise to make exercise part of routine and to increase amount of physical activity.       Increase Strength and Stamina  Yes       Intervention  Provide advice, education, support and counseling about physical activity/exercise needs.;Develop an  individualized exercise prescription for aerobic and resistive training based on initial evaluation findings, risk stratification, comorbidities and participant's personal goals.       Expected Outcomes  Short Term: Increase workloads from initial exercise prescription for resistance, speed, and METs.;Long Term: Improve cardiorespiratory fitness, muscular endurance and strength as measured by increased METs and functional capacity (6MWT);Short Term: Perform resistance training exercises routinely during rehab and add in resistance training at home       Able to understand and use rate of perceived exertion (RPE) scale  Yes       Intervention  Provide education and explanation on how to use RPE scale       Expected Outcomes  Short Term: Able to use RPE daily in rehab to express subjective intensity level;Long Term:  Able to use RPE to guide intensity level when exercising independently       Able to understand and use Dyspnea scale  Yes       Intervention  Provide education and explanation on how to use Dyspnea scale       Expected Outcomes  Short Term: Able to use Dyspnea scale daily in rehab to express subjective sense of shortness of breath during exertion;Long Term: Able to use Dyspnea scale to guide intensity level when exercising independently       Knowledge and understanding of Target Heart Rate Range (THRR)  Yes       Intervention  Provide education and explanation of THRR including how the numbers were predicted and where they are located for reference       Expected Outcomes  Short Term: Able to state/look up THRR;Long Term: Able to use THRR to govern intensity when exercising independently       Understanding of Exercise Prescription  Yes       Intervention  Provide education, explanation, and written materials on patient's individual exercise prescription       Expected Outcomes  Short Term: Able to explain program exercise prescription;Long Term: Able to explain home exercise prescription  to exercise independently          Exercise Goals Re-Evaluation :   Discharge Exercise Prescription (Final Exercise Prescription Changes):   Nutrition:  Target  Goals: Understanding of nutrition guidelines, daily intake of sodium 1500mg , cholesterol 200mg , calories 30% from fat and 7% or less from saturated fats, daily to have 5 or more servings of fruits and vegetables.  Biometrics:    Nutrition Therapy Plan and Nutrition Goals:   Nutrition Assessments:   Nutrition Goals Re-Evaluation:   Nutrition Goals Discharge (Final Nutrition Goals Re-Evaluation):   Psychosocial: Target Goals: Acknowledge presence or absence of significant depression and/or stress, maximize coping skills, provide positive support system. Participant is able to verbalize types and ability to use techniques and skills needed for reducing stress and depression.  Initial Review & Psychosocial Screening: Initial Psych Review & Screening - 04/24/18 1144      Initial Review   Current issues with  Current Stress Concerns    Source of Stress Concerns  Unable to perform yard/household activities;Family;Financial;Unable to participate in former interests or hobbies    Comments  Pt with multiple psychosocial issues.  Pt is tearful today which is not like him.  This causes him stress and strain that he appears "weak"      Family Dynamics   Comments  ex wife and her husband are supportive of [place].  Son helps with getting groceries into the house and put away.  Pt is estranged from his daughter       Quality of Life Scores:  Scores of 19 and below usually indicate a poorer quality of life in these areas.  A difference of  2-3 points is a clinically meaningful difference.  A difference of 2-3 points in the total score of the Quality of Life Index has been associated with significant improvement in overall quality of life, self-image, physical symptoms, and general health in studies assessing change in quality of  life.   PHQ-9: Recent Review Flowsheet Data    Depression screen [facility] 2/9 04/24/2018 04/24/2018 07/02/2017 03/13/2017   Decreased Interest 0 1 0 0   Down, Depressed, Hopeless 0 1 0 0   PHQ - 2 Score 0 2 0 0   Altered sleeping 0 0 - -   Tired, decreased energy 3 3 - -   Change in appetite 0 0 - -   Feeling bad or failure about yourself  2 1 - -   Trouble concentrating 1 1 - -   Moving slowly or fidgety/restless 0 0 - -   Suicidal thoughts 0 0 - -   PHQ-9 Score 6 7 - -   Difficult doing work/chores - Very difficult - -     Interpretation of Total Score  Total Score Depression Severity:  1-4 = Minimal depression, 5-9 = Mild depression, 10-14 = Moderate depression, 15-19 = Moderately severe depression, 20-27 = Severe depression   Psychosocial Evaluation and Intervention: Psychosocial Evaluation - 04/24/18 1146      Psychosocial Evaluation & Interventions   Interventions  Stress management education;Relaxation education;Encouraged to exercise with the program and follow exercise prescription    Comments  pt with multiple issues that he is dealing with such as intrafamilial strain,financial stress and feelings of low self worth.    Expected Outcomes  Pt will report and employ positive and healthy techniques for stress management.    Continue Psychosocial Services   Follow up required by staff       Psychosocial Re-Evaluation:   Psychosocial Discharge (Final Psychosocial Re-Evaluation):    Education: Education Goals: Education classes will be provided on a weekly basis, covering required topics. Participant will state understanding/return demonstration of topics presented.  Learning Barriers/Preferences: Learning Barriers/Preferences - 04/24/18 1035      Learning Barriers/Preferences   Learning Barriers  Sight;Hearing    Learning Preferences  Written Material;Individual Instruction       Education Topics: How Lungs Work and Diseases: - Discuss the anatomy of the lungs  and diseases that can affect the lungs, such as COPD.   Exercise: -Discuss the importance of exercise, FITT principles of exercise, normal and abnormal responses to exercise, and how to exercise safely.   Environmental Irritants: -Discuss types of environmental irritants and how to limit exposure to environmental irritants.   Meds/Inhalers and oxygen: - Discuss respiratory medications, definition of an inhaler and oxygen, and the proper way to use an inhaler and oxygen.   Energy Saving Techniques: - Discuss methods to conserve energy and decrease shortness of breath when performing activities of daily living.    Bronchial Hygiene / Breathing Techniques: - Discuss breathing mechanics, pursed-lip breathing technique,  proper posture, effective ways to clear airways, and other functional breathing techniques   Cleaning Equipment: - Provides group verbal and written instruction about the health risks of elevated stress, cause of high stress, and healthy ways to reduce stress.   Nutrition I: Fats: - Discuss the types of cholesterol, what cholesterol does to the body, and how cholesterol levels can be controlled.   Nutrition II: Labels: -Discuss the different components of food labels and how to read food labels.   Respiratory Infections: - Discuss the signs and symptoms of respiratory infections, ways to prevent respiratory infections, and the importance of seeking medical treatment when having a respiratory infection.   Stress I: Signs and Symptoms: - Discuss the causes of stress, how stress may lead to anxiety and depression, and ways to limit stress.   Stress II: Relaxation: -Discuss relaxation techniques to limit stress.   Oxygen for Home/Travel: - Discuss how to prepare for travel when on oxygen and proper ways to transport and store oxygen to ensure safety.   Knowledge Questionnaire Score: Knowledge Questionnaire Score - 04/28/18 1629      Knowledge  Questionnaire Score   Pre Score  12/18       Core Components/Risk Factors/Patient Goals at Admission: Personal Goals and Risk Factors at Admission - 04/24/18 1036      Core Components/Risk Factors/Patient Goals on Admission    Weight Management  Yes    Intervention  Obesity: Provide education and appropriate resources to help participant work on and attain dietary goals.;Weight Management/Obesity: Establish reasonable short term and long term weight goals.;Weight Management: Develop a combined nutrition and exercise program designed to reach desired caloric intake, while maintaining appropriate intake of nutrient and fiber, sodium and fats, and appropriate energy expenditure required for the weight goal.    Admit Weight  200 lb 11.2 oz (91 kg)    Goal Weight: Short Term  190 lb (86.2 kg)    Goal Weight: Long Term  175 lb (79.4 kg)    Expected Outcomes  Short Term: Continue to assess and modify interventions until short term weight is achieved;Long Term: Adherence to nutrition and physical activity/exercise program aimed toward attainment of established weight goal;Understanding of distribution of calorie intake throughout the day with the consumption of 4-5 meals/snacks;Understanding recommendations for meals to include 15-35% energy as protein, 25-35% energy from fat, 35-60% energy from carbohydrates, less than 200mg  of dietary cholesterol, 20-35 gm of total fiber daily;Weight Loss: Understanding of general recommendations for a balanced deficit meal plan, which promotes 1-2 lb weight loss per  week and includes a negative energy balance of [phone] kcal/d    Improve shortness of breath with ADL's  Yes    Intervention  Provide education, individualized exercise plan and daily activity instruction to help decrease symptoms of SOB with activities of daily living.    Expected Outcomes  Short Term: Improve cardiorespiratory fitness to achieve a reduction of symptoms when performing ADLs;Long Term: Be  able to perform more ADLs without symptoms or delay the onset of symptoms    Stress  Yes    Intervention  Offer individual and/or small group education and counseling on adjustment to heart disease, stress management and health-related lifestyle change. Teach and support self-help strategies.    Expected Outcomes  Short Term: Participant demonstrates changes in health-related behavior, relaxation and other stress management skills, ability to obtain effective social support, and compliance with psychotropic medications if prescribed.;Long Term: Emotional wellbeing is indicated by absence of clinically significant psychosocial distress or social isolation.       Core Components/Risk Factors/Patient Goals Review:    Core Components/Risk Factors/Patient Goals at Discharge (Final Review):    ITP Comments: ITP Comments    Row Name 04/24/18 1012           ITP Comments  Dr. Manfred Arch Medical Director Pulmonary Rehab          Comments:

## 2018-04-30 NOTE — Progress Notes (Addendum)
Pulmonary Individual Treatment Plan  Patient Details  Name: Steve Riley MRN: [id number] Date of Birth: 10/21/1950 Referring Provider:     Pulmonary Rehab Walk Test from 04/28/2018 in [place]  Referring Provider  Dr. Nelda Marseille      Initial Encounter Date:    Pulmonary Rehab Walk Test from 04/28/2018 in [place]  Date  04/28/18      Visit Diagnosis: Chronic obstructive pulmonary disease, unspecified COPD type ([place])  Patient's Home Medications on Admission:   Current Outpatient Medications:  .  acetaminophen (TYLENOL) 325 MG tablet, Take 2 tablets (650 mg total) by mouth every 6 (six) hours as needed for mild pain (or Fever >/= 101)., Disp: , Rfl:  .  albuterol (PROVENTIL HFA;VENTOLIN HFA) 108 (90 Base) MCG/ACT inhaler, Inhale 2 puffs into the lungs every 6 (six) hours as needed for wheezing., Disp: , Rfl:  .  Ascorbic Acid (VITAMIN C WITH ROSE HIPS) 1000 MG tablet, Take 1,000 mg by mouth daily., Disp: , Rfl:  .  B Complex-C-Folic Acid (HM SUPER VITAMIN B COMPLEX/C) TABS, Take by mouth., Disp: , Rfl:  .  budesonide-formoterol (SYMBICORT) 80-4.5 MCG/ACT inhaler, Inhale 2 puffs into the lungs 2 (two) times daily., Disp: , Rfl:  .  Calcium Carbonate-Vitamin D (CALCIUM 600/VITAMIN D) 600-400 MG-UNIT chew tablet, Chew 1 tablet by mouth daily., Disp: , Rfl:  .  Flurandrenolide 4 MCG/SQCM TAPE, Apply topically., Disp: , Rfl:  .  ipratropium-albuterol (DUONEB) 0.5-2.5 (3) MG/3ML SOLN, Take 3 mLs by nebulization every 6 (six) hours as needed (wheezing)., Disp: 360 mL, Rfl: 0 .  meloxicam (MOBIC) 15 MG tablet, Take 15 mg by mouth daily., Disp: , Rfl:  .  Methylsulfonylmethane (MSM) 1000 MG CAPS, Take 1,000 mg by mouth 2 (two) times daily., Disp: , Rfl:  .  Multiple Vitamins-Minerals (CENTRUM SILVER) tablet, Take 1 tablet by mouth daily., Disp: , Rfl:  .  Omega-3 1000 MG CAPS, Take 1 g by mouth 2 (two) times daily., Disp: , Rfl:   .  Thiamine HCl (VITAMIN B-1) 250 MG tablet, Take 250 mg by mouth daily., Disp: , Rfl:  .  traZODone (DESYREL) 100 MG tablet, Take 100 mg by mouth at bedtime., Disp: , Rfl:  .  Turmeric Curcumin 500 MG CAPS, Take 500 mg by mouth 2 (two) times daily., Disp: , Rfl:  .  guaiFENesin (MUCINEX) 600 MG 12 hr tablet, Take 1 tablet (600 mg total) by mouth 2 (two) times daily. (Patient not taking: Reported on 04/28/2018), Disp: 20 tablet, Rfl: 0 .  levofloxacin (LEVAQUIN) 500 MG tablet, Take 1 tablet (500 mg total) by mouth daily. (Patient not taking: Reported on 04/28/2018), Disp: 2 tablet, Rfl: 0 .  pantoprazole (PROTONIX) 40 MG tablet, Take 1 tablet (40 mg total) by mouth daily. (Patient not taking: Reported on 04/28/2018), Disp: 30 tablet, Rfl: 0 .  predniSONE (STERAPRED UNI-PAK 21 TAB) 10 MG (21) TBPK tablet, Use per package instruction (Patient not taking: Reported on 04/28/2018), Disp: 21 tablet, Rfl: 0  Past Medical History: Past Medical History:  Diagnosis Date  . COPD (chronic obstructive pulmonary disease) ([place])   . Prostate cancer ([place])     Tobacco Use: Social History   Tobacco Use  Smoking Status Former Smoker  . Packs/day: 3.00  . Years: 54.00  . Pack years: 162.00  . Types: Cigarettes  . Last attempt to quit: 07/11/2016  . Years since quitting: 1.8  Smokeless Tobacco Never Used  Labs: Recent Review Flowsheet Data    Labs for ITP Cardiac and Pulmonary Rehab Latest Ref Rng & Units 03/06/2018 03/07/2018   Hemoglobin A1c 4.8 - 5.6 % - 6.5(H)   HCO3 20.0 - 28.0 mmol/L 22.9 -   TCO2 22 - 32 mmol/L 24 -   ACIDBASEDEF 0.0 - 2.0 mmol/L 1.0 -   O2SAT % 81.0 -      Capillary Blood Glucose: Lab Results  Component Value Date   GLUCAP 86 03/09/2018   GLUCAP 104 (H) 03/09/2018   GLUCAP 139 (H) 03/08/2018   GLUCAP 225 (H) 03/07/2018     Pulmonary Assessment Scores: Pulmonary Assessment Scores    Row Name 04/28/18 1629 04/30/18 0705       ADL UCSD   ADL Phase  Entry  Entry     SOB Score total  74  -      CAT Score   CAT Score  21  -      mMRC Score   mMRC Score  -  3       Pulmonary Function Assessment:   Exercise Target Goals: Exercise Program Goal: Individual exercise prescription set using results from initial 6 min walk test and THRR while considering  patient's activity barriers and safety.   Exercise Prescription Goal: Initial exercise prescription builds to 30-45 minutes a day of aerobic activity, 2-3 days per week.  Home exercise guidelines will be given to patient during program as part of exercise prescription that the participant will acknowledge.  Activity Barriers & Risk Stratification: Activity Barriers & Cardiac Risk Stratification - 04/24/18 1031      Activity Barriers & Cardiac Risk Stratification   Activity Barriers  Balance Concerns;Deconditioning;Assistive Device;Shortness of Breath;Muscular Weakness;Back Problems   right hip discomfort necrotic femur      6 Minute Walk: 6 Minute Walk    Row Name 04/30/18 0705         6 Minute Walk   Phase  Initial     Distance  500 feet     Walk Time  3.5 minutes     # of Rest Breaks  1     MPH  0.95     METS  1.77     RPE  11     Perceived Dyspnea   1     Symptoms  Yes (comment)     Comments  Pt was in the restroom for the last 2.5 minutes of test. I would have put O2 on him if not, as he desaturated down to 83% on RA. Pt reported low perceptual values although he was visiablly out of breath even 5 minutes post exercise.      Resting HR  70 bpm     Resting BP  124/82     Resting Oxygen Saturation   95 %     Exercise Oxygen Saturation  during 6 min walk  83 %     Max Ex. HR  94 bpm     Max Ex. BP  144/72     2 Minute Post BP  116/78       Interval HR   1 Minute HR  88     2 Minute HR  92     3 Minute HR  92     6 Minute HR  94     2 Minute Post HR  74     Interval Heart Rate?  Yes       Interval Oxygen   Interval  Oxygen?  Yes     Baseline Oxygen Saturation %  95 %      1 Minute Oxygen Saturation %  93 %     1 Minute Liters of Oxygen  0 L     2 Minute Oxygen Saturation %  87 %     2 Minute Liters of Oxygen  0 L     3 Minute Oxygen Saturation %  83 %     3 Minute Liters of Oxygen  0 L     6 Minute Oxygen Saturation %  90 %     6 Minute Liters of Oxygen  0 L     2 Minute Post Oxygen Saturation %  96 %     2 Minute Post Liters of Oxygen  0 L        Oxygen Initial Assessment: Oxygen Initial Assessment - 04/30/18 0703      Home Oxygen   Home Oxygen Device  None    Sleep Oxygen Prescription  None    Home Exercise Oxygen Prescription  None    Home at Rest Exercise Oxygen Prescription  None      Initial 6 min Walk   Oxygen Used  None      Program Oxygen Prescription   Program Oxygen Prescription  E-Tanks    Liters per minute  2    Comments  During walk test on RA, pt desaturated down to 83%. I was going to put O2 on pt for the remainder of walk test, but pt went to the rest room for the rest of the test. Will start at 2L, but may have to increase.      Intervention   Short Term Goals  To learn and exhibit compliance with exercise, home and travel O2 prescription;To learn and understand importance of maintaining oxygen saturations>88%;To learn and demonstrate proper use of respiratory medications;To learn and understand importance of monitoring SPO2 with pulse oximeter and demonstrate accurate use of the pulse oximeter.;To learn and demonstrate proper pursed lip breathing techniques or other breathing techniques.    Long  Term Goals  Verbalizes importance of monitoring SPO2 with pulse oximeter and return demonstration;Exhibits proper breathing techniques, such as pursed lip breathing or other method taught during program session;Exhibits compliance with exercise, home and travel O2 prescription;Maintenance of O2 saturations>88%;Compliance with respiratory medication;Demonstrates proper use of MDI's       Oxygen Re-Evaluation:   Oxygen Discharge  (Final Oxygen Re-Evaluation):   Initial Exercise Prescription: Initial Exercise Prescription - 04/30/18 0700      Date of Initial Exercise RX and Referring Provider   Date  04/28/18    Referring Provider  Dr. Nelda Marseille      Oxygen   Oxygen  Continuous    Liters  2      Prescription Details   Frequency (times per week)  2    Duration  Progress to 45 minutes of aerobic exercise without signs/symptoms of physical distress      Intensity   THRR 40-80% of Max Heartrate  60-121    Ratings of Perceived Exertion  11-13    Perceived Dyspnea  0-4      Progression   Progression  Continue to progress workloads to maintain intensity without signs/symptoms of physical distress.      Resistance Training   Training Prescription  Yes    Weight  blue bands    Reps  10-15       Perform Capillary Blood Glucose checks as needed.  Exercise Prescription Changes:   Exercise Comments:   Exercise Goals and Review: Exercise Goals    Row Name 04/24/18 1029             Exercise Goals   Increase Physical Activity  Yes       Intervention  Provide advice, education, support and counseling about physical activity/exercise needs.;Develop an individualized exercise prescription for aerobic and resistive training based on initial evaluation findings, risk stratification, comorbidities and participant's personal goals.       Expected Outcomes  Short Term: Attend rehab on a regular basis to increase amount of physical activity.;Long Term: Exercising regularly at least 3-5 days a week.;Long Term: Add in home exercise to make exercise part of routine and to increase amount of physical activity.       Increase Strength and Stamina  Yes       Intervention  Provide advice, education, support and counseling about physical activity/exercise needs.;Develop an individualized exercise prescription for aerobic and resistive training based on initial evaluation findings, risk stratification, comorbidities and  participant's personal goals.       Expected Outcomes  Short Term: Increase workloads from initial exercise prescription for resistance, speed, and METs.;Long Term: Improve cardiorespiratory fitness, muscular endurance and strength as measured by increased METs and functional capacity (6MWT);Short Term: Perform resistance training exercises routinely during rehab and add in resistance training at home       Able to understand and use rate of perceived exertion (RPE) scale  Yes       Intervention  Provide education and explanation on how to use RPE scale       Expected Outcomes  Short Term: Able to use RPE daily in rehab to express subjective intensity level;Long Term:  Able to use RPE to guide intensity level when exercising independently       Able to understand and use Dyspnea scale  Yes       Intervention  Provide education and explanation on how to use Dyspnea scale       Expected Outcomes  Short Term: Able to use Dyspnea scale daily in rehab to express subjective sense of shortness of breath during exertion;Long Term: Able to use Dyspnea scale to guide intensity level when exercising independently       Knowledge and understanding of Target Heart Rate Range (THRR)  Yes       Intervention  Provide education and explanation of THRR including how the numbers were predicted and where they are located for reference       Expected Outcomes  Short Term: Able to state/look up THRR;Long Term: Able to use THRR to govern intensity when exercising independently       Understanding of Exercise Prescription  Yes       Intervention  Provide education, explanation, and written materials on patient's individual exercise prescription       Expected Outcomes  Short Term: Able to explain program exercise prescription;Long Term: Able to explain home exercise prescription to exercise independently          Exercise Goals Re-Evaluation :   Discharge Exercise Prescription (Final Exercise Prescription  Changes):   Nutrition:  Target Goals: Understanding of nutrition guidelines, daily intake of sodium 1500mg , cholesterol 200mg , calories 30% from fat and 7% or less from saturated fats, daily to have 5 or more servings of fruits and vegetables.  Biometrics:    Nutrition Therapy Plan and Nutrition Goals:   Nutrition Assessments:   Nutrition Goals Re-Evaluation:   Nutrition  Goals Discharge (Final Nutrition Goals Re-Evaluation):   Psychosocial: Target Goals: Acknowledge presence or absence of significant depression and/or stress, maximize coping skills, provide positive support system. Participant is able to verbalize types and ability to use techniques and skills needed for reducing stress and depression.  Initial Review & Psychosocial Screening: Initial Psych Review & Screening - 04/24/18 1144      Initial Review   Current issues with  Current Stress Concerns    Source of Stress Concerns  Unable to perform yard/household activities;Family;Financial;Unable to participate in former interests or hobbies    Comments  Pt with multiple psychosocial issues.  Pt is tearful today which is not like him.  This causes him stress and strain that he appears "weak"      Family Dynamics   Comments  ex wife and her husband are supportive of [employer].  Son helps with getting groceries into the house and put away.  Pt is estranged from his daughter       Quality of Life Scores:  Scores of 19 and below usually indicate a poorer quality of life in these areas.  A difference of  2-3 points is a clinically meaningful difference.  A difference of 2-3 points in the total score of the Quality of Life Index has been associated with significant improvement in overall quality of life, self-image, physical symptoms, and general health in studies assessing change in quality of life.   PHQ-9: Recent Review Flowsheet Data    Depression screen [facility] 2/9 04/24/2018 04/24/2018 07/02/2017 03/13/2017   Decreased  Interest 0 1 0 0   Down, Depressed, Hopeless 0 1 0 0   PHQ - 2 Score 0 2 0 0   Altered sleeping 0 0 - -   Tired, decreased energy 3 3 - -   Change in appetite 0 0 - -   Feeling bad or failure about yourself  2 1 - -   Trouble concentrating 1 1 - -   Moving slowly or fidgety/restless 0 0 - -   Suicidal thoughts 0 0 - -   PHQ-9 Score 6 7 - -   Difficult doing work/chores - Very difficult - -     Interpretation of Total Score  Total Score Depression Severity:  1-4 = Minimal depression, 5-9 = Mild depression, 10-14 = Moderate depression, 15-19 = Moderately severe depression, 20-27 = Severe depression   Psychosocial Evaluation and Intervention: Psychosocial Evaluation - 04/24/18 1146      Psychosocial Evaluation & Interventions   Interventions  Stress management education;Relaxation education;Encouraged to exercise with the program and follow exercise prescription    Comments  pt with multiple issues that he is dealing with such as intrafamilial strain,financial stress and feelings of low self worth.    Expected Outcomes  Pt will report and employ positive and healthy techniques for stress management.    Continue Psychosocial Services   Follow up required by staff       Psychosocial Re-Evaluation:   Psychosocial Discharge (Final Psychosocial Re-Evaluation):    Education: Education Goals: Education classes will be provided on a weekly basis, covering required topics. Participant will state understanding/return demonstration of topics presented.  Learning Barriers/Preferences: Learning Barriers/Preferences - 04/24/18 1035      Learning Barriers/Preferences   Learning Barriers  Sight;Hearing    Learning Preferences  Written Material;Individual Instruction       Education Topics: How Lungs Work and Diseases: - Discuss the anatomy of the lungs and diseases that can affect the lungs,  such as COPD.   Exercise: -Discuss the importance of exercise, FITT principles of exercise,  normal and abnormal responses to exercise, and how to exercise safely.   Environmental Irritants: -Discuss types of environmental irritants and how to limit exposure to environmental irritants.   Meds/Inhalers and oxygen: - Discuss respiratory medications, definition of an inhaler and oxygen, and the proper way to use an inhaler and oxygen.   Energy Saving Techniques: - Discuss methods to conserve energy and decrease shortness of breath when performing activities of daily living.    Bronchial Hygiene / Breathing Techniques: - Discuss breathing mechanics, pursed-lip breathing technique,  proper posture, effective ways to clear airways, and other functional breathing techniques   Cleaning Equipment: - Provides group verbal and written instruction about the health risks of elevated stress, cause of high stress, and healthy ways to reduce stress.   Nutrition I: Fats: - Discuss the types of cholesterol, what cholesterol does to the body, and how cholesterol levels can be controlled.   Nutrition II: Labels: -Discuss the different components of food labels and how to read food labels.   Respiratory Infections: - Discuss the signs and symptoms of respiratory infections, ways to prevent respiratory infections, and the importance of seeking medical treatment when having a respiratory infection.   Stress I: Signs and Symptoms: - Discuss the causes of stress, how stress may lead to anxiety and depression, and ways to limit stress.   Stress II: Relaxation: -Discuss relaxation techniques to limit stress.   Oxygen for Home/Travel: - Discuss how to prepare for travel when on oxygen and proper ways to transport and store oxygen to ensure safety.   Knowledge Questionnaire Score: Knowledge Questionnaire Score - 04/28/18 1629      Knowledge Questionnaire Score   Pre Score  12/18       Core Components/Risk Factors/Patient Goals at Admission: Personal Goals and Risk Factors at  Admission - 04/24/18 1036      Core Components/Risk Factors/Patient Goals on Admission    Weight Management  Yes    Intervention  Obesity: Provide education and appropriate resources to help participant work on and attain dietary goals.;Weight Management/Obesity: Establish reasonable short term and long term weight goals.;Weight Management: Develop a combined nutrition and exercise program designed to reach desired caloric intake, while maintaining appropriate intake of nutrient and fiber, sodium and fats, and appropriate energy expenditure required for the weight goal.    Admit Weight  200 lb 11.2 oz (91 kg)    Goal Weight: Short Term  190 lb (86.2 kg)    Goal Weight: Long Term  175 lb (79.4 kg)    Expected Outcomes  Short Term: Continue to assess and modify interventions until short term weight is achieved;Long Term: Adherence to nutrition and physical activity/exercise program aimed toward attainment of established weight goal;Understanding of distribution of calorie intake throughout the day with the consumption of 4-5 meals/snacks;Understanding recommendations for meals to include 15-35% energy as protein, 25-35% energy from fat, 35-60% energy from carbohydrates, less than 200mg  of dietary cholesterol, 20-35 gm of total fiber daily;Weight Loss: Understanding of general recommendations for a balanced deficit meal plan, which promotes 1-2 lb weight loss per week and includes a negative energy balance of [phone] kcal/d    Improve shortness of breath with ADL's  Yes    Intervention  Provide education, individualized exercise plan and daily activity instruction to help decrease symptoms of SOB with activities of daily living.    Expected Outcomes  Short Term: Improve cardiorespiratory  fitness to achieve a reduction of symptoms when performing ADLs;Long Term: Be able to perform more ADLs without symptoms or delay the onset of symptoms    Stress  Yes    Intervention  Offer individual and/or small group  education and counseling on adjustment to heart disease, stress management and health-related lifestyle change. Teach and support self-help strategies.    Expected Outcomes  Short Term: Participant demonstrates changes in health-related behavior, relaxation and other stress management skills, ability to obtain effective social support, and compliance with psychotropic medications if prescribed.;Long Term: Emotional wellbeing is indicated by absence of clinically significant psychosocial distress or social isolation.       Core Components/Risk Factors/Patient Goals Review:    Core Components/Risk Factors/Patient Goals at Discharge (Final Review):    ITP Comments: ITP Comments    Row Name 04/24/18 1012           ITP Comments  Dr. Manfred Arch Medical Director Pulmonary Rehab          Comments:

## 2018-05-05 ENCOUNTER — Encounter (HOSPITAL_COMMUNITY)
Admission: RE | Admit: 2018-05-05 | Discharge: 2018-05-05 | Disposition: A | Discharge status: Home / Self Care | Payer: No Typology Code available for payment source | Source: Ambulatory Visit | Attending: Pulmonary Disease | Admitting: Pulmonary Disease

## 2018-05-05 VITALS — Wt 204.1 lb

## 2018-05-05 DIAGNOSIS — J449 Chronic obstructive pulmonary disease, unspecified: Secondary | ICD-10-CM

## 2018-05-05 NOTE — Progress Notes (Signed)
Daily Session Note  Patient Details  Name: Steve Riley MRN: [id number] Date of Birth: 31-Jan-1951 Referring Provider:     Pulmonary Rehab Walk Test from 04/28/2018 in [place]  Referring Provider  Dr. Nelda Marseille      Encounter Date: 05/05/2018  Check In: Session Check In - 05/05/18 1536      Check-In   Supervising physician immediately available to respond to emergencies  [facility] immediately available    Physician(s)  Dr. Starla Link    Location  [facility]    Staff Present  Rosebud Poles, RN, BSN;Carlette Wilber Oliphant, RN, BSN;Tinesha Siegrist Ysidro Evert, RN;Dalton Fletcher, MS, Exercise Physiologist    Medication changes reported      No    Fall or balance concerns reported     No    Tobacco Cessation  No Change    Warm-up and Cool-down  Performed as group-led instruction    Resistance Training Performed  Yes    VAD Patient?  No    PAD/SET Patient?  No      Pain Assessment   Currently in Pain?  No/denies    Pain Score  0-No pain    Multiple Pain Sites  No       Capillary Blood Glucose: No results found for this or any previous visit (from the past 24 hour(s)).  Exercise Prescription Changes - 05/05/18 1500      Response to Exercise   Blood Pressure (Admit)  140/76    Blood Pressure (Exercise)  120/80    Blood Pressure (Exit)  118/70    Heart Rate (Admit)  88 bpm    Heart Rate (Exercise)  98 bpm    Heart Rate (Exit)  86 bpm    Oxygen Saturation (Admit)  92 %    Oxygen Saturation (Exercise)  97 %    Oxygen Saturation (Exit)  95 %    Rating of Perceived Exertion (Exercise)  13    Perceived Dyspnea (Exercise)  2    Duration  Progress to 45 minutes of aerobic exercise without signs/symptoms of physical distress    Intensity  Other (comment)   40-80% of HRR     Progression   Progression  Continue to progress workloads to maintain intensity without signs/symptoms of physical distress.      Resistance Training   Training  Prescription  Yes    Weight  blue bands    Reps  10-15    Time  10 Minutes      Oxygen   Oxygen  Continuous    Liters  2      NuStep   Level  2    SPM  80    Minutes  17    METs  1.5      Arm Ergometer   Level  2    Minutes  17      Track   Laps  6    Minutes  17       Social History   Tobacco Use  Smoking Status Former Smoker  . Packs/day: 3.00  . Years: 54.00  . Pack years: 162.00  . Types: Cigarettes  . Last attempt to quit: 07/11/2016  . Years since quitting: 1.8  Smokeless Tobacco Never Used    Goals Met:  Exercise tolerated well No report of cardiac concerns or symptoms Strength training completed today  Goals Unmet:  Not Applicable  Comments: Service time is from 1330 to 1505    Dr.  Rush Farmer is Medical Director for Pulmonary Rehab at [facility].

## 2018-05-06 ENCOUNTER — Telehealth (HOSPITAL_COMMUNITY): Payer: Self-pay | Admitting: *Deleted

## 2018-05-06 NOTE — Telephone Encounter (Signed)
Message left for Dr. Elinor Dodge in the primary care clinic at [facility].  Message is in regards to advise the referring provider to pulmonary rehab of pt requiring oxygen on exertion/exercise to maintain O2 sats above 88%.  Pt completed 6 minute walk test.  Pt O2 saturations dropped to 83% after ambulating 3 minutes.  Pt was able to walk 500 feet in 6 minutes. Requesting pt receive oxygen for him to use outside of the rehab setting.  2. Requesting pulmonary consult for ongoing management of his COPD.  Per primary office note pt is seeing Dr. Gwenette Greet in [place] however pt denies being seen by any other MD other than his primary MD - Dr. Sherral Hammers.  Await return call. Cherre Huger, BSN Cardiac and Training and development officer

## 2018-05-08 NOTE — Progress Notes (Signed)
Steve Riley 67 y.o. male  DOB: 06-06-1951 MRN: [id number]           Nutrition Note 1. Chronic obstructive pulmonary disease, unspecified COPD type ([name])    Past Medical History:  Diagnosis Date  . COPD (chronic obstructive pulmonary disease) ([name])   . Prostate cancer ([name])    Meds reviewed.   Current Outpatient Medications (Endocrine & Metabolic):  .  predniSONE (STERAPRED UNI-PAK 21 TAB) 10 MG (21) TBPK tablet, Use per package instruction (Patient not taking: Reported on 04/28/2018)   Current Outpatient Medications (Respiratory):  .  albuterol (PROVENTIL HFA;VENTOLIN HFA) 108 (90 Base) MCG/ACT inhaler, Inhale 2 puffs into the lungs every 6 (six) hours as needed for wheezing. .  budesonide-formoterol (SYMBICORT) 80-4.5 MCG/ACT inhaler, Inhale 2 puffs into the lungs 2 (two) times daily. Marland Kitchen  guaiFENesin (MUCINEX) 600 MG 12 hr tablet, Take 1 tablet (600 mg total) by mouth 2 (two) times daily. (Patient not taking: Reported on 04/28/2018) .  ipratropium-albuterol (DUONEB) 0.5-2.5 (3) MG/3ML SOLN, Take 3 mLs by nebulization every 6 (six) hours as needed (wheezing).  Current Outpatient Medications (Analgesics):  .  acetaminophen (TYLENOL) 325 MG tablet, Take 2 tablets (650 mg total) by mouth every 6 (six) hours as needed for mild pain (or Fever >/= 101). .  meloxicam (MOBIC) 15 MG tablet, Take 15 mg by mouth daily.   Current Outpatient Medications (Other):  Marland Kitchen  Ascorbic Acid (VITAMIN C WITH ROSE HIPS) 1000 MG tablet, Take 1,000 mg by mouth daily. .  B Complex-C-Folic Acid (HM SUPER VITAMIN B COMPLEX/C) TABS, Take by mouth. .  Calcium Carbonate-Vitamin D (CALCIUM 600/VITAMIN D) 600-400 MG-UNIT chew tablet, Chew 1 tablet by mouth daily. Marland Kitchen  Flurandrenolide 4 MCG/SQCM TAPE, Apply topically. Marland Kitchen  levofloxacin (LEVAQUIN) 500 MG tablet, Take 1 tablet (500 mg total) by mouth daily. (Patient not taking: Reported on 04/28/2018) .  Methylsulfonylmethane (MSM) 1000 MG CAPS, Take 1,000 mg by mouth 2  (two) times daily. .  Multiple Vitamins-Minerals (CENTRUM SILVER) tablet, Take 1 tablet by mouth daily. .  Omega-3 1000 MG CAPS, Take 1 g by mouth 2 (two) times daily. .  pantoprazole (PROTONIX) 40 MG tablet, Take 1 tablet (40 mg total) by mouth daily. (Patient not taking: Reported on 04/28/2018) .  Thiamine HCl (VITAMIN B-1) 250 MG tablet, Take 250 mg by mouth daily. .  traZODone (DESYREL) 100 MG tablet, Take 100 mg by mouth at bedtime. .  Turmeric Curcumin 500 MG CAPS, Take 500 mg by mouth 2 (two) times daily.   Ht: Ht Readings from Last 1 Encounters:  04/24/18 5' 7.75" (1.721 m)     Wt:  Wt Readings from Last 3 Encounters:  05/05/18 204 lb 2.3 oz (92.6 kg)  04/24/18 200 lb 9.9 oz (91 kg)  03/06/18 217 lb (98.4 kg)     BMI: Body mass index is 30.73 kg/m.    Current tobacco use? No    Labs:  Lipid Panel  No results found for: CHOL, TRIG, HDL, CHOLHDL, VLDL, LDLCALC, LDLDIRECT  Lab Results  Component Value Date   HGBA1C 6.5 (H) 03/07/2018    Nutrition Diagnosis  ? Food-and nutrition-related knowledge deficit related to lack of exposure to information as related to diagnosis of pulmonary disease ? Overweight/obesity related to excessive energy intake as evidenced by a BMI of Body mass index is 30.73 kg/m.    Goal(s) 1. To be determined  Plan:  Pt to attend Pulmonary Nutrition class Will provide client-centered nutrition education as part  of interdisciplinary care.    Monitor and Evaluate progress toward nutrition goal with team.   Laurina Bustle, MS, RD, LDN 05/08/2018 12:52 PM

## 2018-05-09 DIAGNOSIS — J441 Chronic obstructive pulmonary disease with (acute) exacerbation: Secondary | ICD-10-CM | POA: Diagnosis not present

## 2018-05-12 ENCOUNTER — Encounter (HOSPITAL_COMMUNITY)
Admission: RE | Admit: 2018-05-12 | Discharge: 2018-05-12 | Disposition: A | Discharge status: Home / Self Care | Payer: No Typology Code available for payment source | Source: Ambulatory Visit | Attending: Pulmonary Disease | Admitting: Pulmonary Disease

## 2018-05-12 DIAGNOSIS — Z79899 Other long term (current) drug therapy: Secondary | ICD-10-CM | POA: Insufficient documentation

## 2018-05-12 DIAGNOSIS — C61 Malignant neoplasm of prostate: Secondary | ICD-10-CM | POA: Insufficient documentation

## 2018-05-12 DIAGNOSIS — J449 Chronic obstructive pulmonary disease, unspecified: Secondary | ICD-10-CM | POA: Insufficient documentation

## 2018-05-12 NOTE — Progress Notes (Signed)
Daily Session Note  Patient Details  Name: Steve Riley MRN: [id number] Date of Birth: 11/12/1950 Referring Provider:     Pulmonary Rehab Walk Test from 04/28/2018 in [facility]  Referring Provider  Dr. Yacoub      Encounter Date: 05/12/2018  Check In: Session Check In - 05/12/18 1350      Check-In   Supervising physician immediately available to respond to emergencies  [facility] immediately available    Physician(s)  Dr. Grunz    Location  [facility]    Staff Present  Joan Behrens, RN, BSN;Carlette Carlton, RN, BSN;Dalton Fletcher, MS, Exercise Physiologist;Lisa Hughes, RN    Medication changes reported      No    Fall or balance concerns reported     No    Tobacco Cessation  No Change    Warm-up and Cool-down  Performed as group-led instruction    Resistance Training Performed  Yes    VAD Patient?  No    PAD/SET Patient?  No      Pain Assessment   Currently in Pain?  No/denies    Pain Score  0-No pain    Multiple Pain Sites  No       Capillary Blood Glucose: No results found for this or any previous visit (from the past 24 hour(s)).    Social History   Tobacco Use  Smoking Status Former Smoker  . Packs/day: 3.00  . Years: 54.00  . Pack years: 162.00  . Types: Cigarettes  . Last attempt to quit: 07/11/2016  . Years since quitting: 1.8  Smokeless Tobacco Never Used    Goals Met:  Exercise tolerated well No report of cardiac concerns or symptoms Strength training completed today  Goals Unmet:  Not Applicable  Comments: Service time is from 1330 to 1520    Dr. Wesam G. Yacoub is Medical Director for Pulmonary Rehab at [facility]. 

## 2018-05-14 ENCOUNTER — Encounter (HOSPITAL_COMMUNITY): Payer: No Typology Code available for payment source

## 2018-05-14 ENCOUNTER — Telehealth (HOSPITAL_COMMUNITY): Payer: Self-pay | Admitting: *Deleted

## 2018-05-14 ENCOUNTER — Encounter (HOSPITAL_COMMUNITY)
Admission: RE | Admit: 2018-05-14 | Discharge: 2018-05-14 | Disposition: A | Discharge status: Home / Self Care | Payer: No Typology Code available for payment source | Source: Ambulatory Visit | Attending: Pulmonary Disease | Admitting: Pulmonary Disease

## 2018-05-14 VITALS — Ht 67.75 in | Wt 205.5 lb

## 2018-05-14 DIAGNOSIS — J449 Chronic obstructive pulmonary disease, unspecified: Secondary | ICD-10-CM

## 2018-05-14 DIAGNOSIS — Z79899 Other long term (current) drug therapy: Secondary | ICD-10-CM | POA: Diagnosis not present

## 2018-05-14 DIAGNOSIS — C61 Malignant neoplasm of prostate: Secondary | ICD-10-CM | POA: Diagnosis not present

## 2018-05-14 NOTE — Progress Notes (Signed)
Steve Riley 67 y.o. male   DOB: 07/19/1950 MRN: [id number]          Nutrition No diagnosis found. Past Medical History:  Diagnosis Date  . COPD (chronic obstructive pulmonary disease) ([place])   . Prostate cancer ([place])      Meds reviewed.  Current Outpatient Medications (Endocrine & Metabolic):  .  predniSONE (STERAPRED UNI-PAK 21 TAB) 10 MG (21) TBPK tablet, Use per package instruction (Patient not taking: Reported on 04/28/2018)   Current Outpatient Medications (Respiratory):  .  albuterol (PROVENTIL HFA;VENTOLIN HFA) 108 (90 Base) MCG/ACT inhaler, Inhale 2 puffs into the lungs every 6 (six) hours as needed for wheezing. .  budesonide-formoterol (SYMBICORT) 80-4.5 MCG/ACT inhaler, Inhale 2 puffs into the lungs 2 (two) times daily. Marland Kitchen  guaiFENesin (MUCINEX) 600 MG 12 hr tablet, Take 1 tablet (600 mg total) by mouth 2 (two) times daily. (Patient not taking: Reported on 04/28/2018) .  ipratropium-albuterol (DUONEB) 0.5-2.5 (3) MG/3ML SOLN, Take 3 mLs by nebulization every 6 (six) hours as needed (wheezing).  Current Outpatient Medications (Analgesics):  .  acetaminophen (TYLENOL) 325 MG tablet, Take 2 tablets (650 mg total) by mouth every 6 (six) hours as needed for mild pain (or Fever >/= 101). .  meloxicam (MOBIC) 15 MG tablet, Take 15 mg by mouth daily.   Current Outpatient Medications (Other):  Marland Kitchen  Ascorbic Acid (VITAMIN C WITH ROSE HIPS) 1000 MG tablet, Take 1,000 mg by mouth daily. .  B Complex-C-Folic Acid (HM SUPER VITAMIN B COMPLEX/C) TABS, Take by mouth. .  Calcium Carbonate-Vitamin D (CALCIUM 600/VITAMIN D) 600-400 MG-UNIT chew tablet, Chew 1 tablet by mouth daily. Marland Kitchen  Flurandrenolide 4 MCG/SQCM TAPE, Apply topically. Marland Kitchen  levofloxacin (LEVAQUIN) 500 MG tablet, Take 1 tablet (500 mg total) by mouth daily. (Patient not taking: Reported on 04/28/2018) .  Methylsulfonylmethane (MSM) 1000 MG CAPS, Take 1,000 mg by mouth 2 (two) times daily. .  Multiple Vitamins-Minerals (CENTRUM  SILVER) tablet, Take 1 tablet by mouth daily. .  Omega-3 1000 MG CAPS, Take 1 g by mouth 2 (two) times daily. .  pantoprazole (PROTONIX) 40 MG tablet, Take 1 tablet (40 mg total) by mouth daily. (Patient not taking: Reported on 04/28/2018) .  Thiamine HCl (VITAMIN B-1) 250 MG tablet, Take 250 mg by mouth daily. .  traZODone (DESYREL) 100 MG tablet, Take 100 mg by mouth at bedtime. .  Turmeric Curcumin 500 MG CAPS, Take 500 mg by mouth 2 (two) times daily.  Ht: Ht Readings from Last 1 Encounters:  05/14/18 5' 7.75" (1.721 m)     Wt:  Wt Readings from Last 3 Encounters:  05/14/18 205 lb 7.5 oz (93.2 kg)  05/05/18 204 lb 2.3 oz (92.6 kg)  04/24/18 200 lb 9.9 oz (91 kg)     BMI: Body mass index is 31.47 kg/m.     Current tobacco use? No  Labs:  Lipid Panel  No results found for: CHOL, TRIG, HDL, CHOLHDL, VLDL, LDLCALC, LDLDIRECT  Lab Results  Component Value Date   HGBA1C 6.5 (H) 03/07/2018   Note Spoke with pt. Pt is obese.  Pt eats 2-3 meals a day; most prepared at home. Discussed increasing the frequency of eating, but having several small meals across the day to help manage hunger and promote weight loss. Additionally discussed building a healthy plate and portions sizes. Reviewed the differences between simple and complex carbs, recommended pt replace simple carbs in diet with complex carbs.  Making healthy food choices the some of  the time.  Pt's Rate Your Plate results reviewed with pt. Pt does not avoid salty food; uses canned/ convenience food.  Pt adds salt to food.  The role of sodium in lung disease reviewed with pt. Showed patient how to read a nutrition facts label. Distributed handout for label reading. Pt expressed understanding of the information reviewed.  Nutrition Diagnosis  Food-and nutrition-related knowledge deficit related to lack of exposure to information as related to diagnosis of pulmonary disease  Overweight/obesity related to excessive energy intake  as evidenced by a BMI of Body mass index is 30.73 kg/m.  Nutrition Intervention ? Pt's individual nutrition plan and goals reviewed with pt. ? Benefits of adopting healthy eating habits discussed when pt's Rate Your Plate reviewed.  Goal(s) 1. Pt to identify and limit food sources of sodium, and refined carbohydrates 2. The pt will have family and friends shop for food when necessary so that nourishing foods are always available at home. 3. Identify food quantities necessary to achieve wt loss of  -2# per week to a goal wt loss of 6-24 lb at graduation from pulmonary rehab. 4. Describe the benefit of including fruits, vegetables, whole grains, and low-fat dairy products in a healthy meal plan. 5. Pt able to name foods that affect blood glucose   Plan:  Pt to attend Pulmonary Nutrition class Will provide client-centered nutrition education as part of interdisciplinary care.    Monitor and Evaluate progress toward nutrition goal with team.   Laurina Bustle, MS, RD, LDN 05/14/2018 3:28 PM

## 2018-05-14 NOTE — Progress Notes (Signed)
Daily Session Note  Patient Details  Name: Brogen T Slee MRN: [id number] Date of Birth: 10/29/1950 Referring Provider:     Pulmonary Rehab Walk Test from 04/28/2018 in [facility]  Referring Provider  Dr. Yacoub      Encounter Date: 05/14/2018  Check In: Session Check In - 05/14/18 1531      Check-In   Supervising physician immediately available to respond to emergencies  [facility] immediately available    Physician(s)  Dr. Chiu    Location  [facility]    Staff Present  Joan Behrens, RN, BSN;Carlette Carlton, RN, BSN;Dalton Fletcher, MS, Exercise Physiologist;Lisa Hughes, RN    Medication changes reported      No    Fall or balance concerns reported     No    Tobacco Cessation  No Change    Warm-up and Cool-down  Performed as group-led instruction    Resistance Training Performed  Yes    VAD Patient?  No    PAD/SET Patient?  No      Pain Assessment   Currently in Pain?  No/denies    Pain Score  0-No pain    Multiple Pain Sites  No       Capillary Blood Glucose: No results found for this or any previous visit (from the past 24 hour(s)).    Social History   Tobacco Use  Smoking Status Former Smoker  . Packs/day: 3.00  . Years: 54.00  . Pack years: 162.00  . Types: Cigarettes  . Last attempt to quit: 07/11/2016  . Years since quitting: 1.8  Smokeless Tobacco Never Used    Goals Met:  Proper associated with RPD/PD & O2 Sat Exercise tolerated well  Goals Unmet:  Not Applicable  Comments: Service time is from 1330 to 1515   Dr. Wesam G. Yacoub is Medical Director for Pulmonary Rehab at  Hospital. 

## 2018-05-18 ENCOUNTER — Telehealth (HOSPITAL_COMMUNITY): Payer: Self-pay | Admitting: *Deleted

## 2018-05-19 ENCOUNTER — Telehealth (HOSPITAL_COMMUNITY): Payer: Self-pay | Admitting: *Deleted

## 2018-05-19 ENCOUNTER — Encounter (HOSPITAL_COMMUNITY)
Admission: RE | Admit: 2018-05-19 | Discharge: 2018-05-19 | Disposition: A | Discharge status: Home / Self Care | Payer: No Typology Code available for payment source | Source: Ambulatory Visit | Attending: Pulmonary Disease | Admitting: Pulmonary Disease

## 2018-05-19 VITALS — Wt 202.8 lb

## 2018-05-19 DIAGNOSIS — J449 Chronic obstructive pulmonary disease, unspecified: Secondary | ICD-10-CM

## 2018-05-19 NOTE — Progress Notes (Signed)
Daily Session Note  Patient Details  Name: Steve Riley MRN: [id number] Date of Birth: September 19, 1950 Referring Provider:     Pulmonary Rehab Walk Test from 04/28/2018 in [place]  Referring Provider  Dr. Nelda Marseille      Encounter Date: 05/19/2018  Check In: Session Check In - 05/19/18 1528      Check-In   Supervising physician immediately available to respond to emergencies  [facility] immediately available    Physician(s)  Dr. Wyline Copas    Location  [facility]    Staff Present  Rosebud Poles, RN, BSN;Carlette Wilber Oliphant, RN, BSN;Amber Fair, MS, ACSM RCEP, Exercise Physiologist;Juandedios Dudash Ysidro Evert, RN    Medication changes reported      No    Fall or balance concerns reported     No    Tobacco Cessation  No Change    Warm-up and Cool-down  Performed as group-led instruction    Resistance Training Performed  Yes    VAD Patient?  No    PAD/SET Patient?  No      Pain Assessment   Currently in Pain?  No/denies    Pain Score  0-No pain    Multiple Pain Sites  No       Capillary Blood Glucose: No results found for this or any previous visit (from the past 24 hour(s)).  Exercise Prescription Changes - 05/19/18 1500      Response to Exercise   Blood Pressure (Admit)  112/70    Blood Pressure (Exercise)  124/78    Blood Pressure (Exit)  122/78    Heart Rate (Admit)  73 bpm    Heart Rate (Exercise)  111 bpm    Heart Rate (Exit)  87 bpm    Oxygen Saturation (Admit)  96 %    Oxygen Saturation (Exercise)  95 %    Oxygen Saturation (Exit)  99 %    Rating of Perceived Exertion (Exercise)  11    Perceived Dyspnea (Exercise)  3    Duration  Progress to 45 minutes of aerobic exercise without signs/symptoms of physical distress    Intensity  THRR unchanged      Progression   Progression  Continue to progress workloads to maintain intensity without signs/symptoms of physical distress.      Resistance Training   Training Prescription  Yes    Weight  blue bands    Reps  10-15    Time  10 Minutes      Oxygen   Oxygen  Continuous    Liters  2      NuStep   Level  3    SPM  80    Minutes  17    METs  1.8      Arm Ergometer   Level  2.5    Minutes  17       Social History   Tobacco Use  Smoking Status Former Smoker  . Packs/day: 3.00  . Years: 54.00  . Pack years: 162.00  . Types: Cigarettes  . Last attempt to quit: 07/11/2016  . Years since quitting: 1.8  Smokeless Tobacco Never Used    Goals Met:  Exercise tolerated well No report of cardiac concerns or symptoms Strength training completed today  Goals Unmet:  Not Applicable  Comments: Service time is from 1330 to 1430    Dr. Rush Farmer is Medical Director for Pulmonary Rehab at [facility].

## 2018-05-21 ENCOUNTER — Encounter (HOSPITAL_COMMUNITY)
Admission: RE | Admit: 2018-05-21 | Discharge: 2018-05-21 | Disposition: A | Discharge status: Home / Self Care | Payer: No Typology Code available for payment source | Source: Ambulatory Visit | Attending: Pulmonary Disease | Admitting: Pulmonary Disease

## 2018-05-21 DIAGNOSIS — J449 Chronic obstructive pulmonary disease, unspecified: Secondary | ICD-10-CM | POA: Diagnosis not present

## 2018-05-21 NOTE — Progress Notes (Signed)
Pulmonary Individual Treatment Plan  Patient Details  Name: Steve Riley MRN: [id number] Date of Birth: May 12, 1951 Referring Provider:     Pulmonary Rehab Walk Test from 04/28/2018 in [facility]  Referring Provider  Dr. Nelda Marseille      Initial Encounter Date:    Pulmonary Rehab Walk Test from 04/28/2018 in [facility]  Date  04/28/18      Visit Diagnosis: Chronic obstructive pulmonary disease, unspecified COPD type ([place])  Patient's Home Medications on Admission:   Current Outpatient Medications:  .  acetaminophen (TYLENOL) 325 MG tablet, Take 2 tablets (650 mg total) by mouth every 6 (six) hours as needed for mild pain (or Fever >/= 101)., Disp: , Rfl:  .  albuterol (PROVENTIL HFA;VENTOLIN HFA) 108 (90 Base) MCG/ACT inhaler, Inhale 2 puffs into the lungs every 6 (six) hours as needed for wheezing., Disp: , Rfl:  .  Ascorbic Acid (VITAMIN C WITH ROSE HIPS) 1000 MG tablet, Take 1,000 mg by mouth daily., Disp: , Rfl:  .  B Complex-C-Folic Acid (HM SUPER VITAMIN B COMPLEX/C) TABS, Take by mouth., Disp: , Rfl:  .  budesonide-formoterol (SYMBICORT) 80-4.5 MCG/ACT inhaler, Inhale 2 puffs into the lungs 2 (two) times daily., Disp: , Rfl:  .  Calcium Carbonate-Vitamin D (CALCIUM 600/VITAMIN D) 600-400 MG-UNIT chew tablet, Chew 1 tablet by mouth daily., Disp: , Rfl:  .  Flurandrenolide 4 MCG/SQCM TAPE, Apply topically., Disp: , Rfl:  .  guaiFENesin (MUCINEX) 600 MG 12 hr tablet, Take 1 tablet (600 mg total) by mouth 2 (two) times daily. (Patient not taking: Reported on 04/28/2018), Disp: 20 tablet, Rfl: 0 .  ipratropium-albuterol (DUONEB) 0.5-2.5 (3) MG/3ML SOLN, Take 3 mLs by nebulization every 6 (six) hours as needed (wheezing)., Disp: 360 mL, Rfl: 0 .  levofloxacin (LEVAQUIN) 500 MG tablet, Take 1 tablet (500 mg total) by mouth daily. (Patient not taking: Reported on 04/28/2018), Disp: 2 tablet, Rfl: 0 .  meloxicam (MOBIC) 15 MG  tablet, Take 15 mg by mouth daily., Disp: , Rfl:  .  Methylsulfonylmethane (MSM) 1000 MG CAPS, Take 1,000 mg by mouth 2 (two) times daily., Disp: , Rfl:  .  Multiple Vitamins-Minerals (CENTRUM SILVER) tablet, Take 1 tablet by mouth daily., Disp: , Rfl:  .  Omega-3 1000 MG CAPS, Take 1 g by mouth 2 (two) times daily., Disp: , Rfl:  .  pantoprazole (PROTONIX) 40 MG tablet, Take 1 tablet (40 mg total) by mouth daily. (Patient not taking: Reported on 04/28/2018), Disp: 30 tablet, Rfl: 0 .  predniSONE (STERAPRED UNI-PAK 21 TAB) 10 MG (21) TBPK tablet, Use per package instruction (Patient not taking: Reported on 04/28/2018), Disp: 21 tablet, Rfl: 0 .  Thiamine HCl (VITAMIN B-1) 250 MG tablet, Take 250 mg by mouth daily., Disp: , Rfl:  .  traZODone (DESYREL) 100 MG tablet, Take 100 mg by mouth at bedtime., Disp: , Rfl:  .  Turmeric Curcumin 500 MG CAPS, Take 500 mg by mouth 2 (two) times daily., Disp: , Rfl:   Past Medical History: Past Medical History:  Diagnosis Date  . COPD (chronic obstructive pulmonary disease) ([place])   . Prostate cancer ([place])     Tobacco Use: Social History   Tobacco Use  Smoking Status Former Smoker  . Packs/day: 3.00  . Years: 54.00  . Pack years: 162.00  . Types: Cigarettes  . Last attempt to quit: 07/11/2016  . Years since quitting: 1.8  Smokeless Tobacco Never Used  Labs: Recent Review Flowsheet Data    Labs for ITP Cardiac and Pulmonary Rehab Latest Ref Rng & Units 03/06/2018 03/07/2018   Hemoglobin A1c 4.8 - 5.6 % - 6.5(H)   HCO3 20.0 - 28.0 mmol/L 22.9 -   TCO2 22 - 32 mmol/L 24 -   ACIDBASEDEF 0.0 - 2.0 mmol/L 1.0 -   O2SAT % 81.0 -      Capillary Blood Glucose: Lab Results  Component Value Date   GLUCAP 86 03/09/2018   GLUCAP 104 (H) 03/09/2018   GLUCAP 139 (H) 03/08/2018   GLUCAP 225 (H) 03/07/2018     Pulmonary Assessment Scores: Pulmonary Assessment Scores    Row Name 04/28/18 1629 04/30/18 0705       ADL UCSD   ADL Phase  Entry   Entry    SOB Score total  74  -      CAT Score   CAT Score  21  -      mMRC Score   mMRC Score  -  3       Pulmonary Function Assessment:   Exercise Target Goals: Exercise Program Goal: Individual exercise prescription set using results from initial 6 min walk test and THRR while considering  patient's activity barriers and safety.   Exercise Prescription Goal: Initial exercise prescription builds to 30-45 minutes a day of aerobic activity, 2-3 days per week.  Home exercise guidelines will be given to patient during program as part of exercise prescription that the participant will acknowledge.  Activity Barriers & Risk Stratification: Activity Barriers & Cardiac Risk Stratification - 04/24/18 1031      Activity Barriers & Cardiac Risk Stratification   Activity Barriers  Balance Concerns;Deconditioning;Assistive Device;Shortness of Breath;Muscular Weakness;Back Problems   right hip discomfort necrotic femur      6 Minute Walk: 6 Minute Walk    Row Name 04/30/18 0705         6 Minute Walk   Phase  Initial     Distance  500 feet     Walk Time  3.5 minutes     # of Rest Breaks  1     MPH  0.95     METS  1.77     RPE  11     Perceived Dyspnea   1     Symptoms  Yes (comment)     Comments  Pt was in the restroom for the last 2.5 minutes of test. I would have put O2 on him if not, as he desaturated down to 83% on RA. Pt reported low perceptual values although he was visiablly out of breath even 5 minutes post exercise.      Resting HR  70 bpm     Resting BP  124/82     Resting Oxygen Saturation   95 %     Exercise Oxygen Saturation  during 6 min walk  83 %     Max Ex. HR  94 bpm     Max Ex. BP  144/72     2 Minute Post BP  116/78       Interval HR   1 Minute HR  88     2 Minute HR  92     3 Minute HR  92     6 Minute HR  94     2 Minute Post HR  74     Interval Heart Rate?  Yes       Interval Oxygen   Interval  Oxygen?  Yes     Baseline Oxygen Saturation %  95  %     1 Minute Oxygen Saturation %  93 %     1 Minute Liters of Oxygen  0 L     2 Minute Oxygen Saturation %  87 %     2 Minute Liters of Oxygen  0 L     3 Minute Oxygen Saturation %  83 %     3 Minute Liters of Oxygen  0 L     6 Minute Oxygen Saturation %  90 %     6 Minute Liters of Oxygen  0 L     2 Minute Post Oxygen Saturation %  96 %     2 Minute Post Liters of Oxygen  0 L        Oxygen Initial Assessment: Oxygen Initial Assessment - 04/30/18 0703      Home Oxygen   Home Oxygen Device  None    Sleep Oxygen Prescription  None    Home Exercise Oxygen Prescription  None    Home at Rest Exercise Oxygen Prescription  None      Initial 6 min Walk   Oxygen Used  None      Program Oxygen Prescription   Program Oxygen Prescription  E-Tanks    Liters per minute  2    Comments  During walk test on RA, pt desaturated down to 83%. I was going to put O2 on pt for the remainder of walk test, but pt went to the rest room for the rest of the test. Will start at 2L, but may have to increase.      Intervention   Short Term Goals  To learn and exhibit compliance with exercise, home and travel O2 prescription;To learn and understand importance of maintaining oxygen saturations>88%;To learn and demonstrate proper use of respiratory medications;To learn and understand importance of monitoring SPO2 with pulse oximeter and demonstrate accurate use of the pulse oximeter.;To learn and demonstrate proper pursed lip breathing techniques or other breathing techniques.    Long  Term Goals  Verbalizes importance of monitoring SPO2 with pulse oximeter and return demonstration;Exhibits proper breathing techniques, such as pursed lip breathing or other method taught during program session;Exhibits compliance with exercise, home and travel O2 prescription;Maintenance of O2 saturations>88%;Compliance with respiratory medication;Demonstrates proper use of MDI's       Oxygen Re-Evaluation: Oxygen  Re-Evaluation    Row Name 05/20/18 0858             Program Oxygen Prescription   Program Oxygen Prescription  E-Tanks       Liters per minute  2         Home Oxygen   Home Oxygen Device  None       Home Exercise Oxygen Prescription  Continuous       Liters per minute  2 Pt has not been supplied with home oxygen yet, so I will not advise on home exercise until home oxygen is obtained.        Home at Rest Exercise Oxygen Prescription  None         Goals/Expected Outcomes   Short Term Goals  To learn and exhibit compliance with exercise, home and travel O2 prescription;To learn and understand importance of maintaining oxygen saturations>88%;To learn and demonstrate proper use of respiratory medications;To learn and understand importance of monitoring SPO2 with pulse oximeter and demonstrate accurate use of the pulse oximeter.;To learn and demonstrate proper  pursed lip breathing techniques or other breathing techniques.       Comments  Setting up home oxygen. Expect him to have it soon.       Goals/Expected Outcomes  compliance with home oxygen when obtained.          Oxygen Discharge (Final Oxygen Re-Evaluation): Oxygen Re-Evaluation - 05/20/18 0858      Program Oxygen Prescription   Program Oxygen Prescription  E-Tanks    Liters per minute  2      Home Oxygen   Home Oxygen Device  None    Home Exercise Oxygen Prescription  Continuous    Liters per minute  2   Pt has not been supplied with home oxygen yet, so I will not advise on home exercise until home oxygen is obtained.    Home at Rest Exercise Oxygen Prescription  None      Goals/Expected Outcomes   Short Term Goals  To learn and exhibit compliance with exercise, home and travel O2 prescription;To learn and understand importance of maintaining oxygen saturations>88%;To learn and demonstrate proper use of respiratory medications;To learn and understand importance of monitoring SPO2 with pulse oximeter and demonstrate  accurate use of the pulse oximeter.;To learn and demonstrate proper pursed lip breathing techniques or other breathing techniques.    Comments  Setting up home oxygen. Expect him to have it soon.    Goals/Expected Outcomes  compliance with home oxygen when obtained.       Initial Exercise Prescription: Initial Exercise Prescription - 04/30/18 0700      Date of Initial Exercise RX and Referring Provider   Date  04/28/18    Referring Provider  Dr. Nelda Marseille      Oxygen   Oxygen  Continuous    Liters  2      NuStep   Level  2    SPM  80    Minutes  17      Arm Ergometer   Level  2    Minutes  17      Track   Laps  8    Minutes  17      Prescription Details   Frequency (times per week)  2    Duration  Progress to 45 minutes of aerobic exercise without signs/symptoms of physical distress      Intensity   THRR 40-80% of Max Heartrate  60-121    Ratings of Perceived Exertion  11-13    Perceived Dyspnea  0-4      Progression   Progression  Continue to progress workloads to maintain intensity without signs/symptoms of physical distress.      Resistance Training   Training Prescription  Yes    Weight  blue bands    Reps  10-15       Perform Capillary Blood Glucose checks as needed.  Exercise Prescription Changes: Exercise Prescription Changes    Row Name 05/05/18 1500 05/19/18 1500           Response to Exercise   Blood Pressure (Admit)  140/76  112/70      Blood Pressure (Exercise)  120/80  124/78      Blood Pressure (Exit)  118/70  122/78      Heart Rate (Admit)  88 bpm  73 bpm      Heart Rate (Exercise)  98 bpm  111 bpm      Heart Rate (Exit)  86 bpm  87 bpm      Oxygen Saturation (Admit)  92 %  96 %      Oxygen Saturation (Exercise)  97 %  95 %      Oxygen Saturation (Exit)  95 %  99 %      Rating of Perceived Exertion (Exercise)  13  11      Perceived Dyspnea (Exercise)  2  3      Duration  Progress to 45 minutes of aerobic exercise without  signs/symptoms of physical distress  Progress to 45 minutes of aerobic exercise without signs/symptoms of physical distress      Intensity  Other (comment) 40-80% of HRR  THRR unchanged        Progression   Progression  Continue to progress workloads to maintain intensity without signs/symptoms of physical distress.  Continue to progress workloads to maintain intensity without signs/symptoms of physical distress.        Resistance Training   Training Prescription  Yes  Yes      Weight  blue bands  blue bands      Reps  10-15  10-15      Time  10 Minutes  10 Minutes        Oxygen   Oxygen  Continuous  Continuous      Liters  2  2        NuStep   Level  2  3      SPM  80  80      Minutes  17  17      METs  1.5  1.8        Arm Ergometer   Level  2  2.5      Minutes  17  17        Track   Laps  6  -      Minutes  17  -         Exercise Comments:   Exercise Goals and Review: Exercise Goals    Row Name 04/24/18 1029             Exercise Goals   Increase Physical Activity  Yes       Intervention  Provide advice, education, support and counseling about physical activity/exercise needs.;Develop an individualized exercise prescription for aerobic and resistive training based on initial evaluation findings, risk stratification, comorbidities and participant's personal goals.       Expected Outcomes  Short Term: Attend rehab on a regular basis to increase amount of physical activity.;Long Term: Exercising regularly at least 3-5 days a week.;Long Term: Add in home exercise to make exercise part of routine and to increase amount of physical activity.       Increase Strength and Stamina  Yes       Intervention  Provide advice, education, support and counseling about physical activity/exercise needs.;Develop an individualized exercise prescription for aerobic and resistive training based on initial evaluation findings, risk stratification, comorbidities and participant's personal  goals.       Expected Outcomes  Short Term: Increase workloads from initial exercise prescription for resistance, speed, and METs.;Long Term: Improve cardiorespiratory fitness, muscular endurance and strength as measured by increased METs and functional capacity (6MWT);Short Term: Perform resistance training exercises routinely during rehab and add in resistance training at home       Able to understand and use rate of perceived exertion (RPE) scale  Yes       Intervention  Provide education and explanation on how to use RPE scale  Expected Outcomes  Short Term: Able to use RPE daily in rehab to express subjective intensity level;Long Term:  Able to use RPE to guide intensity level when exercising independently       Able to understand and use Dyspnea scale  Yes       Intervention  Provide education and explanation on how to use Dyspnea scale       Expected Outcomes  Short Term: Able to use Dyspnea scale daily in rehab to express subjective sense of shortness of breath during exertion;Long Term: Able to use Dyspnea scale to guide intensity level when exercising independently       Knowledge and understanding of Target Heart Rate Range (THRR)  Yes       Intervention  Provide education and explanation of THRR including how the numbers were predicted and where they are located for reference       Expected Outcomes  Short Term: Able to state/look up THRR;Long Term: Able to use THRR to govern intensity when exercising independently       Understanding of Exercise Prescription  Yes       Intervention  Provide education, explanation, and written materials on patient's individual exercise prescription       Expected Outcomes  Short Term: Able to explain program exercise prescription;Long Term: Able to explain home exercise prescription to exercise independently          Exercise Goals Re-Evaluation : Exercise Goals Re-Evaluation    Row Name 05/20/18 0900             Exercise Goal Re-Evaluation    Exercise Goals Review  Increase Physical Activity;Increase Strength and Stamina;Able to understand and use rate of perceived exertion (RPE) scale;Able to understand and use Dyspnea scale;Knowledge and understanding of Target Heart Rate Range (THRR);Understanding of Exercise Prescription       Comments  Pt has completed 4 exercise sessions. Pt needs oxygen at home, so I will not advise on home exercise at this time. Pt has hip pain that at times he rates 10/10. Because of this I have removed him from walking the track, and his primary exercise comes from the arm ergometer. An appointment for his hip pain is in the works. Progress will be hindered until the hip pain is resolved. Will continue to progress and monitor as able.        Expected Outcomes  Through exercise at rehab and at home, the patient will decrease shortness of breath with daily activities and feel confident in carrying out an exercise regime at home.           Discharge Exercise Prescription (Final Exercise Prescription Changes): Exercise Prescription Changes - 05/19/18 1500      Response to Exercise   Blood Pressure (Admit)  112/70    Blood Pressure (Exercise)  124/78    Blood Pressure (Exit)  122/78    Heart Rate (Admit)  73 bpm    Heart Rate (Exercise)  111 bpm    Heart Rate (Exit)  87 bpm    Oxygen Saturation (Admit)  96 %    Oxygen Saturation (Exercise)  95 %    Oxygen Saturation (Exit)  99 %    Rating of Perceived Exertion (Exercise)  11    Perceived Dyspnea (Exercise)  3    Duration  Progress to 45 minutes of aerobic exercise without signs/symptoms of physical distress    Intensity  THRR unchanged      Progression   Progression  Continue to  progress workloads to maintain intensity without signs/symptoms of physical distress.      Resistance Training   Training Prescription  Yes    Weight  blue bands    Reps  10-15    Time  10 Minutes      Oxygen   Oxygen  Continuous    Liters  2      NuStep   Level  3     SPM  80    Minutes  17    METs  1.8      Arm Ergometer   Level  2.5    Minutes  17       Nutrition:  Target Goals: Understanding of nutrition guidelines, daily intake of sodium '1500mg'$ , cholesterol '200mg'$ , calories 30% from fat and 7% or less from saturated fats, daily to have 5 or more servings of fruits and vegetables.  Biometrics:    Nutrition Therapy Plan and Nutrition Goals: Nutrition Therapy & Goals - 05/14/18 1525      Nutrition Therapy   Diet  diabetic      Personal Nutrition Goals   Nutrition Goal  pt able to name foods that affect blood glucose    Personal Goal #2  Pt to identify and limit food sources of sodium, and refined carbohydrates    Personal Goal #3  Identify food quantities necessary to achieve wt loss of  -2# per week to a goal wt loss of 6-24 lb at graduation from pulmonary rehab.    Personal Goal #4  Describe the benefit of including fruits, vegetables, whole grains, and low-fat dairy products in a healthy meal plan.      Intervention Plan   Intervention  Prescribe, educate and counsel regarding individualized specific dietary modifications aiming towards targeted core components such as weight, hypertension, lipid management, diabetes, heart failure and other comorbidities.    Expected Outcomes  Short Term Goal: Understand basic principles of dietary content, such as calories, fat, sodium, cholesterol and nutrients.;Long Term Goal: Adherence to prescribed nutrition plan.       Nutrition Assessments: Nutrition Assessments - 05/08/18 1254      Rate Your Plate Scores   Pre Score  41       Nutrition Goals Re-Evaluation:   Nutrition Goals Discharge (Final Nutrition Goals Re-Evaluation):   Psychosocial: Target Goals: Acknowledge presence or absence of significant depression and/or stress, maximize coping skills, provide positive support system. Participant is able to verbalize types and ability to use techniques and skills needed for reducing  stress and depression.  Initial Review & Psychosocial Screening: Initial Psych Review & Screening - 04/24/18 1144      Initial Review   Current issues with  Current Stress Concerns    Source of Stress Concerns  Unable to perform yard/household activities;Family;Financial;Unable to participate in former interests or hobbies    Comments  Pt with multiple psychosocial issues.  Pt is tearful today which is not like him.  This causes him stress and strain that he appears "weak"      Family Dynamics   Comments  ex wife and her husband are supportive of [place].  Son helps with getting groceries into the house and put away.  Pt is estranged from his daughter       Quality of Life Scores:  Scores of 19 and below usually indicate a poorer quality of life in these areas.  A difference of  2-3 points is a clinically meaningful difference.  A difference of 2-3 points in the  total score of the Quality of Life Index has been associated with significant improvement in overall quality of life, self-image, physical symptoms, and general health in studies assessing change in quality of life.  PHQ-9: Recent Review Flowsheet Data    Depression screen [facility] 2/9 04/24/2018 04/24/2018 07/02/2017 03/13/2017   Decreased Interest 0 1 0 0   Down, Depressed, Hopeless 0 1 0 0   PHQ - 2 Score 0 2 0 0   Altered sleeping 0 0 - -   Tired, decreased energy 3 3 - -   Change in appetite 0 0 - -   Feeling bad or failure about yourself  2 1 - -   Trouble concentrating 1 1 - -   Moving slowly or fidgety/restless 0 0 - -   Suicidal thoughts 0 0 - -   PHQ-9 Score 6 7 - -   Difficult doing work/chores - Very difficult - -     Interpretation of Total Score  Total Score Depression Severity:  1-4 = Minimal depression, 5-9 = Mild depression, 10-14 = Moderate depression, 15-19 = Moderately severe depression, 20-27 = Severe depression   Psychosocial Evaluation and Intervention: Psychosocial Evaluation - 04/24/18 1146       Psychosocial Evaluation & Interventions   Interventions  Stress management education;Relaxation education;Encouraged to exercise with the program and follow exercise prescription    Comments  pt with multiple issues that he is dealing with such as intrafamilial strain,financial stress and feelings of low self worth.    Expected Outcomes  Pt will report and employ positive and healthy techniques for stress management.    Continue Psychosocial Services   Follow up required by staff       Psychosocial Re-Evaluation: Psychosocial Re-Evaluation    [place] Name 05/19/18 0808             Psychosocial Re-Evaluation   Current issues with  Current Stress Concerns       Comments  Seems to be enjoying the program, he seems to be handling his stress well       Expected Outcomes  continue to deal with his stress in appropriate ways       Continue Psychosocial Services   Follow up required by staff       Comments  has attended 3 exercise sessions, is dealing with severe hip pain and has been encouraged to see PCP for evaluation         Initial Review   Source of Stress Concerns  Chronic Illness;Family;Unable to participate in former interests or hobbies          Psychosocial Discharge (Final Psychosocial Re-Evaluation): Psychosocial Re-Evaluation - 05/19/18 0808      Psychosocial Re-Evaluation   Current issues with  Current Stress Concerns    Comments  Seems to be enjoying the program, he seems to be handling his stress well    Expected Outcomes  continue to deal with his stress in appropriate ways    Continue Psychosocial Services   Follow up required by staff    Comments  has attended 3 exercise sessions, is dealing with severe hip pain and has been encouraged to see PCP for evaluation      Initial Review   Source of Stress Concerns  Chronic Illness;Family;Unable to participate in former interests or hobbies       Education: Education Goals: Education classes will be provided on a weekly  basis, covering required topics. Participant will state understanding/return demonstration of topics presented.  Learning Barriers/Preferences: Learning Barriers/Preferences - 04/24/18 1035      Learning Barriers/Preferences   Learning Barriers  Sight;Hearing    Learning Preferences  Written Material;Individual Instruction       Education Topics: Risk Factor Reduction:  -Group instruction that is supported by a PowerPoint presentation. Instructor discusses the definition of a risk factor, different risk factors for pulmonary disease, and how the heart and lungs work together.     Nutrition for Pulmonary Patient:  -Group instruction provided by PowerPoint slides, verbal discussion, and written materials to support subject matter. The instructor gives an explanation and review of healthy diet recommendations, which includes a discussion on weight management, recommendations for fruit and vegetable consumption, as well as protein, fluid, caffeine, fiber, sodium, sugar, and alcohol. Tips for eating when patients are short of breath are discussed.   Pursed Lip Breathing:  -Group instruction that is supported by demonstration and informational handouts. Instructor discusses the benefits of pursed lip and diaphragmatic breathing and detailed demonstration on how to preform both.     Oxygen Safety:  -Group instruction provided by PowerPoint, verbal discussion, and written material to support subject matter. There is an overview of "What is Oxygen" and "Why do we need it".  Instructor also reviews how to create a safe environment for oxygen use, the importance of using oxygen as prescribed, and the risks of noncompliance. There is a brief discussion on traveling with oxygen and resources the patient may utilize.   Oxygen Equipment:  -Group instruction provided by [facility] Staff utilizing handouts, written materials, and equipment demonstrations.   Signs and Symptoms:  -Group instruction  provided by written material and verbal discussion to support subject matter. Warning signs and symptoms of infection, stroke, and heart attack are reviewed and when to call the physician/911 reinforced. Tips for preventing the spread of infection discussed.   Advanced Directives:  -Group instruction provided by verbal instruction and written material to support subject matter. Instructor reviews Advanced Directive laws and proper instruction for filling out document.   Pulmonary Video:  -Group video education that reviews the importance of medication and oxygen compliance, exercise, good nutrition, pulmonary hygiene, and pursed lip and diaphragmatic breathing for the pulmonary patient.   Exercise for the Pulmonary Patient:  -Group instruction that is supported by a PowerPoint presentation. Instructor discusses benefits of exercise, core components of exercise, frequency, duration, and intensity of an exercise routine, importance of utilizing pulse oximetry during exercise, safety while exercising, and options of places to exercise outside of rehab.     Pulmonary Medications:  -Verbally interactive group education provided by instructor with focus on inhaled medications and proper administration.   PULMONARY REHAB CHRONIC OBSTRUCTIVE PULMONARY DISEASE from 05/12/2018 in [place]  Date  05/12/18  Educator  Anderson Malta  Instruction Review Code  1- Verbalizes Understanding      Anatomy and Physiology of the Respiratory System and Intimacy:  -Group instruction provided by PowerPoint, verbal discussion, and written material to support subject matter. Instructor reviews respiratory cycle and anatomical components of the respiratory system and their functions. Instructor also reviews differences in obstructive and restrictive respiratory diseases with examples of each. Intimacy, Sex, and Sexuality differences are reviewed with a discussion on how relationships can  change when diagnosed with pulmonary disease. Common sexual concerns are reviewed.   MD DAY -A group question and answer session with a medical doctor that allows participants to ask questions that relate to their pulmonary disease state.   OTHER  EDUCATION -Group or individual verbal, written, or video instructions that support the educational goals of the pulmonary rehab program.   Holiday Eating Survival Tips:  -Group instruction provided by PowerPoint slides, verbal discussion, and written materials to support subject matter. The instructor gives patients tips, tricks, and techniques to help them not only survive but enjoy the holidays despite the onslaught of food that accompanies the holidays.   Knowledge Questionnaire Score: Knowledge Questionnaire Score - 04/28/18 1629      Knowledge Questionnaire Score   Pre Score  12/18       Core Components/Risk Factors/Patient Goals at Admission: Personal Goals and Risk Factors at Admission - 04/24/18 1036      Core Components/Risk Factors/Patient Goals on Admission    Weight Management  Yes    Intervention  Obesity: Provide education and appropriate resources to help participant work on and attain dietary goals.;Weight Management/Obesity: Establish reasonable short term and long term weight goals.;Weight Management: Develop a combined nutrition and exercise program designed to reach desired caloric intake, while maintaining appropriate intake of nutrient and fiber, sodium and fats, and appropriate energy expenditure required for the weight goal.    Admit Weight  200 lb 11.2 oz (91 kg)    Goal Weight: Short Term  190 lb (86.2 kg)    Goal Weight: Long Term  175 lb (79.4 kg)    Expected Outcomes  Short Term: Continue to assess and modify interventions until short term weight is achieved;Long Term: Adherence to nutrition and physical activity/exercise program aimed toward attainment of established weight goal;Understanding of distribution of  calorie intake throughout the day with the consumption of 4-5 meals/snacks;Understanding recommendations for meals to include 15-35% energy as protein, 25-35% energy from fat, 35-60% energy from carbohydrates, less than '200mg'$  of dietary cholesterol, 20-35 gm of total fiber daily;Weight Loss: Understanding of general recommendations for a balanced deficit meal plan, which promotes 1-2 lb weight loss per week and includes a negative energy balance of [phone] kcal/d    Improve shortness of breath with ADL's  Yes    Intervention  Provide education, individualized exercise plan and daily activity instruction to help decrease symptoms of SOB with activities of daily living.    Expected Outcomes  Short Term: Improve cardiorespiratory fitness to achieve a reduction of symptoms when performing ADLs;Long Term: Be able to perform more ADLs without symptoms or delay the onset of symptoms    Stress  Yes    Intervention  Offer individual and/or small group education and counseling on adjustment to heart disease, stress management and health-related lifestyle change. Teach and support self-help strategies.    Expected Outcomes  Short Term: Participant demonstrates changes in health-related behavior, relaxation and other stress management skills, ability to obtain effective social support, and compliance with psychotropic medications if prescribed.;Long Term: Emotional wellbeing is indicated by absence of clinically significant psychosocial distress or social isolation.       Core Components/Risk Factors/Patient Goals Review:  Goals and Risk Factor Review    Row Name 05/19/18 0816             Core Components/Risk Factors/Patient Goals Review   Personal Goals Review  Weight Management/Obesity;Improve shortness of breath with ADL's;Increase knowledge of respiratory medications and ability to use respiratory devices properly.;Develop more efficient breathing techniques such as purse lipped breathing and  diaphragmatic breathing and practicing self-pacing with activity.       Review  Has attended 3 exercise sessions, too early to have met goals, have contacted the [place]  multiple times to set up home oxygen for him to use when he exercises, also dealing with severe hip pain and encouraged to see his PCP for evaluation.       Expected Outcomes  see admission core components          Core Components/Risk Factors/Patient Goals at Discharge (Final Review):  Goals and Risk Factor Review - 05/19/18 0816      Core Components/Risk Factors/Patient Goals Review   Personal Goals Review  Weight Management/Obesity;Improve shortness of breath with ADL's;Increase knowledge of respiratory medications and ability to use respiratory devices properly.;Develop more efficient breathing techniques such as purse lipped breathing and diaphragmatic breathing and practicing self-pacing with activity.    Review  Has attended 3 exercise sessions, too early to have met goals, have contacted the [place] multiple times to set up home oxygen for him to use when he exercises, also dealing with severe hip pain and encouraged to see his PCP for evaluation.    Expected Outcomes  see admission core components       ITP Comments: ITP Comments    Row Name 04/24/18 1012           ITP Comments  Dr. Manfred Arch Medical Director Pulmonary Rehab          Comments: ITP REVIEW Pt is making expected progress toward pulmonary rehab goals after completing 4 sessions. Recommend continued exercise, life style modification, education, and utilization of breathing techniques to increase stamina and strength and decrease shortness of breath with exertion.

## 2018-05-21 NOTE — Progress Notes (Signed)
Daily Session Note  Patient Details  Name: Steve Riley MRN: [id number] Date of Birth: 01/06/1951 Referring Provider:     Pulmonary Rehab Walk Test from 04/28/2018 in [place]  Referring Provider  Dr. Nelda Marseille      Encounter Date: 05/21/2018  Check In: Session Check In - 05/21/18 1340      Check-In   Supervising physician immediately available to respond to emergencies  [facility] immediately available    Physician(s)  Dr. Lonny Prude    Location  [facility]    Staff Present  Rosebud Poles, RN, Bjorn Loser, MS, Exercise Physiologist;Rogelio Winbush Ysidro Evert, RN;Carlette Wilber Oliphant, RN, BSN    Medication changes reported      No    Fall or balance concerns reported     No    Tobacco Cessation  No Change    Warm-up and Cool-down  Performed as group-led instruction    Resistance Training Performed  Yes    VAD Patient?  No    PAD/SET Patient?  No      Pain Assessment   Currently in Pain?  No/denies    Pain Score  0-No pain    Multiple Pain Sites  No       Capillary Blood Glucose: No results found for this or any previous visit (from the past 24 hour(s)).    Social History   Tobacco Use  Smoking Status Former Smoker  . Packs/day: 3.00  . Years: 54.00  . Pack years: 162.00  . Types: Cigarettes  . Last attempt to quit: 07/11/2016  . Years since quitting: 1.8  Smokeless Tobacco Never Used    Goals Met:  Exercise tolerated well No report of cardiac concerns or symptoms Strength training completed today  Goals Unmet:  Not Applicable  Comments: Service time is from 1330 to 1510    Dr. Rush Farmer is Medical Director for Pulmonary Rehab at [facility].

## 2018-05-26 ENCOUNTER — Encounter (HOSPITAL_COMMUNITY)
Admission: RE | Admit: 2018-05-26 | Discharge: 2018-05-26 | Disposition: A | Discharge status: Home / Self Care | Payer: No Typology Code available for payment source | Source: Ambulatory Visit | Attending: Pulmonary Disease | Admitting: Pulmonary Disease

## 2018-05-26 DIAGNOSIS — J449 Chronic obstructive pulmonary disease, unspecified: Secondary | ICD-10-CM | POA: Diagnosis not present

## 2018-05-26 NOTE — Progress Notes (Signed)
Daily Session Note  Patient Details  Name: Steve Riley MRN: [id number] Date of Birth: 01-Feb-1951 Referring Provider:     Pulmonary Rehab Walk Test from 04/28/2018 in [place]  Referring Provider  Dr. Nelda Marseille      Encounter Date: 05/26/2018  Check In: Session Check In - 05/26/18 1523      Check-In   Supervising physician immediately available to respond to emergencies  [facility] immediately available    Physician(s)  Dr. Nevada Crane    Location  [facility]    Staff Present  [name] RN, Maxcine Ham, RN, BSN;Carlette Wilber Oliphant, RN, Bjorn Loser, MS, Exercise Physiologist;Lisa Ysidro Evert, RN    Medication changes reported      No    Fall or balance concerns reported     No    Tobacco Cessation  No Change    Warm-up and Cool-down  Performed as group-led instruction    Resistance Training Performed  Yes    VAD Patient?  No    PAD/SET Patient?  No      Pain Assessment   Currently in Pain?  No/denies    Pain Score  0-No pain    Multiple Pain Sites  No       Capillary Blood Glucose: No results found for this or any previous visit (from the past 24 hour(s)).    Social History   Tobacco Use  Smoking Status Former Smoker  . Packs/day: 3.00  . Years: 54.00  . Pack years: 162.00  . Types: Cigarettes  . Last attempt to quit: 07/11/2016  . Years since quitting: 1.8  Smokeless Tobacco Never Used    Goals Met:  Proper associated with RPD/PD & O2 Sat Exercise tolerated well Strength training completed today  Goals Unmet:  Not Applicable  Comments: Service time from 1330 to 1505.   Dr. Rush Farmer is Medical Director for Pulmonary Rehab at [facility].

## 2018-05-28 ENCOUNTER — Encounter (HOSPITAL_COMMUNITY)
Admission: RE | Admit: 2018-05-28 | Discharge: 2018-05-28 | Disposition: A | Discharge status: Home / Self Care | Payer: No Typology Code available for payment source | Source: Ambulatory Visit | Attending: Pulmonary Disease | Admitting: Pulmonary Disease

## 2018-05-28 DIAGNOSIS — J449 Chronic obstructive pulmonary disease, unspecified: Secondary | ICD-10-CM

## 2018-05-28 NOTE — Progress Notes (Signed)
Daily Session Note  Patient Details  Name: Steve Riley MRN: [id number] Date of Birth: September 15, 1950 Referring Provider:     Pulmonary Rehab Walk Test from 04/28/2018 in [place]  Referring Provider  Dr. Nelda Marseille      Encounter Date: 05/28/2018  Check In: Session Check In - 05/28/18 1343      Check-In   Supervising physician immediately available to respond to emergencies  [facility] immediately available    Physician(s)  Dr. Karleen Hampshire    Location  [facility]    Staff Present  [name] RN, Maxcine Ham, RN, BSN;Carlette Wilber Oliphant, RN, BSN;Lisa Ysidro Evert, RN;Dalton Fletcher, MS, Exercise Physiologist    Medication changes reported      No    Fall or balance concerns reported     No    Tobacco Cessation  No Change    Warm-up and Cool-down  Performed as group-led instruction    Resistance Training Performed  Yes    VAD Patient?  No    PAD/SET Patient?  No      Pain Assessment   Currently in Pain?  No/denies    Pain Score  0-No pain    Multiple Pain Sites  No       Capillary Blood Glucose: No results found for this or any previous visit (from the past 24 hour(s)).    Social History   Tobacco Use  Smoking Status Former Smoker  . Packs/day: 3.00  . Years: 54.00  . Pack years: 162.00  . Types: Cigarettes  . Last attempt to quit: 07/11/2016  . Years since quitting: 1.8  Smokeless Tobacco Never Used    Goals Met:  Proper associated with RPD/PD & O2 Sat Exercise tolerated well Strength training completed today  Goals Unmet:  Not Applicable  Comments: Service time is from 1330 to 1520   Dr. Rush Farmer is Medical Director for Pulmonary Rehab at [facility].

## 2018-06-02 ENCOUNTER — Encounter (HOSPITAL_COMMUNITY)
Admission: RE | Admit: 2018-06-02 | Discharge: 2018-06-02 | Disposition: A | Discharge status: Home / Self Care | Payer: No Typology Code available for payment source | Source: Ambulatory Visit | Attending: Pulmonary Disease | Admitting: Pulmonary Disease

## 2018-06-02 VITALS — Wt 206.1 lb

## 2018-06-02 DIAGNOSIS — J449 Chronic obstructive pulmonary disease, unspecified: Secondary | ICD-10-CM

## 2018-06-02 NOTE — Progress Notes (Signed)
Daily Session Note  Patient Details  Name: Steve Riley MRN: [id number] Date of Birth: 1951-03-28 Referring Provider:     Pulmonary Rehab Walk Test from 04/28/2018 in [place]  Referring Provider  Dr. Nelda Marseille      Encounter Date: 06/02/2018  Check In: Session Check In - 06/02/18 1039      Check-In   Supervising physician immediately available to respond to emergencies  [facility] immediately available    Physician(s)  Dr. Karleen Hampshire    Location  [facility]    Staff Present  [name] RN, Maxcine Ham, RN, BSN;Carlette Wilber Oliphant, RN, BSN;Lisa Ysidro Evert, RN;Dalton Fletcher, MS, Exercise Physiologist    Medication changes reported      No    Fall or balance concerns reported     No    Tobacco Cessation  No Change    Warm-up and Cool-down  Performed as group-led instruction    Resistance Training Performed  Yes    VAD Patient?  No    PAD/SET Patient?  No      Pain Assessment   Currently in Pain?  No/denies    Pain Score  0-No pain    Multiple Pain Sites  No       Capillary Blood Glucose: No results found for this or any previous visit (from the past 24 hour(s)).  Exercise Prescription Changes - 06/02/18 1200      Response to Exercise   Blood Pressure (Admit)  120/70    Blood Pressure (Exercise)  148/64    Blood Pressure (Exit)  138/70    Heart Rate (Admit)  80 bpm    Heart Rate (Exercise)  111 bpm    Heart Rate (Exit)  82 bpm    Oxygen Saturation (Admit)  95 %    Oxygen Saturation (Exercise)  92 %    Oxygen Saturation (Exit)  96 %    Rating of Perceived Exertion (Exercise)  14    Perceived Dyspnea (Exercise)  3    Duration  Progress to 45 minutes of aerobic exercise without signs/symptoms of physical distress    Intensity  THRR unchanged      Progression   Progression  Continue to progress workloads to maintain intensity without signs/symptoms of physical distress.      Resistance Training   Training  Prescription  Yes    Weight  blue bands    Reps  10-15    Time  10 Minutes      Oxygen   Oxygen  Continuous    Liters  2      NuStep   Level  4    SPM  80    Minutes  17    METs  1.7      Arm Ergometer   Level  2.5    Minutes  17       Social History   Tobacco Use  Smoking Status Former Smoker  . Packs/day: 3.00  . Years: 54.00  . Pack years: 162.00  . Types: Cigarettes  . Last attempt to quit: 07/11/2016  . Years since quitting: 1.8  Smokeless Tobacco Never Used    Goals Met:  Proper associated with RPD/PD & O2 Sat Exercise tolerated well Strength training completed today  Goals Unmet:  Not Applicable  Comments: Service time is from 1030 to 1200   Dr. Rush Farmer is Medical Director for Pulmonary Rehab at [facility].

## 2018-06-04 ENCOUNTER — Encounter (HOSPITAL_COMMUNITY)
Admission: RE | Admit: 2018-06-04 | Discharge: 2018-06-04 | Disposition: A | Discharge status: Home / Self Care | Payer: No Typology Code available for payment source | Source: Ambulatory Visit | Attending: Pulmonary Disease | Admitting: Pulmonary Disease

## 2018-06-04 DIAGNOSIS — J449 Chronic obstructive pulmonary disease, unspecified: Secondary | ICD-10-CM

## 2018-06-04 NOTE — Progress Notes (Signed)
Daily Session Note  Patient Details  Name: JAQUALIN SERPA MRN: [id number] Date of Birth: August 22, 1950 Referring Provider:     Pulmonary Rehab Walk Test from 04/28/2018 in [place]  Referring Provider  Dr. Nelda Marseille      Encounter Date: 06/04/2018  Check In: Session Check In - 06/04/18 1330      Check-In   Supervising physician immediately available to respond to emergencies  [facility] immediately available    Physician(s)  Dr. Karleen Hampshire    Location  [facility]    Staff Present  [name] RN, BSN;Carlette Wilber Oliphant, RN, Maxcine Ham, RN, Bjorn Loser, MS, Exercise Physiologist;Lisa Ysidro Evert, RN    Medication changes reported      No    Fall or balance concerns reported     No    Tobacco Cessation  No Change    Warm-up and Cool-down  Performed as group-led instruction    Resistance Training Performed  Yes    VAD Patient?  No    PAD/SET Patient?  No      Pain Assessment   Currently in Pain?  No/denies    Multiple Pain Sites  No       Capillary Blood Glucose: No results found for this or any previous visit (from the past 24 hour(s)).    Social History   Tobacco Use  Smoking Status Former Smoker  . Packs/day: 3.00  . Years: 54.00  . Pack years: 162.00  . Types: Cigarettes  . Last attempt to quit: 07/11/2016  . Years since quitting: 1.8  Smokeless Tobacco Never Used    Goals Met:  Proper associated with RPD/PD & O2 Sat Exercise tolerated well Strength training completed today  Goals Unmet:  Not Applicable  Comments: Service time is from 1330 to 1450    Dr. Rush Farmer is Medical Director for Pulmonary Rehab at [facility].

## 2018-06-08 DIAGNOSIS — J441 Chronic obstructive pulmonary disease with (acute) exacerbation: Secondary | ICD-10-CM | POA: Diagnosis not present

## 2018-06-09 ENCOUNTER — Encounter (HOSPITAL_COMMUNITY)
Admission: RE | Admit: 2018-06-09 | Discharge: 2018-06-09 | Disposition: A | Discharge status: Home / Self Care | Payer: No Typology Code available for payment source | Source: Ambulatory Visit | Attending: Pulmonary Disease | Admitting: Pulmonary Disease

## 2018-06-09 DIAGNOSIS — J449 Chronic obstructive pulmonary disease, unspecified: Secondary | ICD-10-CM

## 2018-06-09 NOTE — Progress Notes (Signed)
Daily Session Note  Patient Details  Name: Steve Riley MRN: [id number] Date of Birth: 07-Mar-1951 Referring Provider:     Pulmonary Rehab Walk Test from 04/28/2018 in [place]  Referring Provider  Dr. Nelda Marseille      Encounter Date: 06/09/2018  Check In: Session Check In - 06/09/18 1515      Check-In   Supervising physician immediately available to respond to emergencies  [facility] immediately available    Physician(s)  Dr. Nevada Crane    Location  [facility]    Staff Present  [name] RN, BSN;Dalton Kris Mouton, MS, Exercise Physiologist;Lisa Ysidro Evert, RN;Kayman Snuffer Leonia Reeves, RN, BSN    Medication changes reported      No    Fall or balance concerns reported     No    Tobacco Cessation  No Change    Warm-up and Cool-down  Performed as group-led instruction    Resistance Training Performed  Yes    VAD Patient?  No    PAD/SET Patient?  No      Pain Assessment   Currently in Pain?  No/denies    Pain Score  0-No pain    Multiple Pain Sites  No       Capillary Blood Glucose: No results found for this or any previous visit (from the past 24 hour(s)).    Social History   Tobacco Use  Smoking Status Former Smoker  . Packs/day: 3.00  . Years: 54.00  . Pack years: 162.00  . Types: Cigarettes  . Last attempt to quit: 07/11/2016  . Years since quitting: 1.9  Smokeless Tobacco Never Used    Goals Met:  Proper associated with RPD/PD & O2 Sat Exercise tolerated well Strength training completed today  Goals Unmet:  Not Applicable  Comments: Service time is from 1330 to 1500    Dr. Rush Farmer is Medical Director for Pulmonary Rehab at [facility].

## 2018-06-11 ENCOUNTER — Encounter (HOSPITAL_COMMUNITY)
Admission: RE | Admit: 2018-06-11 | Discharge: 2018-06-11 | Disposition: A | Discharge status: Home / Self Care | Payer: No Typology Code available for payment source | Source: Ambulatory Visit | Attending: Pulmonary Disease | Admitting: Pulmonary Disease

## 2018-06-11 DIAGNOSIS — Z79899 Other long term (current) drug therapy: Secondary | ICD-10-CM | POA: Diagnosis not present

## 2018-06-11 DIAGNOSIS — J449 Chronic obstructive pulmonary disease, unspecified: Secondary | ICD-10-CM | POA: Diagnosis not present

## 2018-06-11 DIAGNOSIS — C61 Malignant neoplasm of prostate: Secondary | ICD-10-CM | POA: Insufficient documentation

## 2018-06-11 NOTE — Progress Notes (Signed)
Daily Session Note  Patient Details  Name: Steve Riley MRN: [id number] Date of Birth: 1950-10-19 Referring Provider:     Pulmonary Rehab Walk Test from 04/28/2018 in [place]  Referring Provider  Dr. Nelda Marseille      Encounter Date: 06/11/2018  Check In: Session Check In - 06/11/18 1355      Check-In   Supervising physician immediately available to respond to emergencies  [facility] immediately available    Physician(s)  Dr. hall    Location  [facility]    Staff Present  [name] RN, Maxcine Ham, RN, BSN;Lisa Ysidro Evert, RN;Dalton Fletcher, MS, Exercise Physiologist    Medication changes reported      No    Fall or balance concerns reported     No    Tobacco Cessation  No Change    Warm-up and Cool-down  Performed as group-led instruction    Resistance Training Performed  Yes    VAD Patient?  No    PAD/SET Patient?  No      Pain Assessment   Currently in Pain?  No/denies    Pain Score  0-No pain    Multiple Pain Sites  No       Capillary Blood Glucose: No results found for this or any previous visit (from the past 24 hour(s)).    Social History   Tobacco Use  Smoking Status Former Smoker  . Packs/day: 3.00  . Years: 54.00  . Pack years: 162.00  . Types: Cigarettes  . Last attempt to quit: 07/11/2016  . Years since quitting: 1.9  Smokeless Tobacco Never Used    Goals Met:  Proper associated with RPD/PD & O2 Sat Exercise tolerated well Strength training completed today  Goals Unmet:  Not Applicable  Comments: Service time is from 1330 to 1510    Dr. Rush Farmer is Medical Director for Pulmonary Rehab at [facility].

## 2018-06-16 ENCOUNTER — Encounter (HOSPITAL_COMMUNITY)
Admission: RE | Admit: 2018-06-16 | Discharge: 2018-06-16 | Disposition: A | Discharge status: Home / Self Care | Payer: No Typology Code available for payment source | Source: Ambulatory Visit | Attending: Pulmonary Disease | Admitting: Pulmonary Disease

## 2018-06-16 DIAGNOSIS — J449 Chronic obstructive pulmonary disease, unspecified: Secondary | ICD-10-CM | POA: Diagnosis not present

## 2018-06-16 NOTE — Progress Notes (Signed)
Daily Session Note  Patient Details  Name: Steve Riley MRN: [id number] Date of Birth: Aug 15, 1950 Referring Provider:     Pulmonary Rehab Walk Test from 04/28/2018 in [place]  Referring Provider  Dr. Nelda Marseille      Encounter Date: 06/16/2018  Check In: Session Check In - 06/16/18 1511      Check-In   Supervising physician immediately available to respond to emergencies  [facility] immediately available    Physician(s)  Dr. Tana Coast    Location  [facility]    Staff Present  Rosebud Poles, RN, BSN;Rage Beever Ysidro Evert, RN;Dalton Kris Mouton, MS, Exercise Physiologist    Medication changes reported      No    Fall or balance concerns reported     No    Tobacco Cessation  No Change    Warm-up and Cool-down  Performed as group-led instruction    Resistance Training Performed  Yes    VAD Patient?  No    PAD/SET Patient?  No      Pain Assessment   Currently in Pain?  No/denies       Capillary Blood Glucose: No results found for this or any previous visit (from the past 24 hour(s)).  Exercise Prescription Changes - 06/16/18 1500      Response to Exercise   Blood Pressure (Admit)  132/80    Blood Pressure (Exercise)  146/68    Blood Pressure (Exit)  102/72    Heart Rate (Admit)  90 bpm    Heart Rate (Exercise)  94 bpm    Heart Rate (Exit)  83 bpm    Oxygen Saturation (Admit)  96 %    Oxygen Saturation (Exercise)  94 %    Oxygen Saturation (Exit)  92 %    Rating of Perceived Exertion (Exercise)  13    Perceived Dyspnea (Exercise)  3    Duration  Progress to 45 minutes of aerobic exercise without signs/symptoms of physical distress    Intensity  THRR unchanged      Progression   Progression  Continue to progress workloads to maintain intensity without signs/symptoms of physical distress.      Resistance Training   Training Prescription  Yes    Weight  blue bands    Reps  10-15    Time  10 Minutes      Oxygen   Oxygen   Continuous    Liters  2      NuStep   Level  4    SPM  80    Minutes  17    METs  2      Arm Ergometer   Level  2.5    Minutes  17       Social History   Tobacco Use  Smoking Status Former Smoker  . Packs/day: 3.00  . Years: 54.00  . Pack years: 162.00  . Types: Cigarettes  . Last attempt to quit: 07/11/2016  . Years since quitting: 1.9  Smokeless Tobacco Never Used    Goals Met:  Exercise tolerated well No report of cardiac concerns or symptoms Strength training completed today  Goals Unmet:  Not Applicable  Comments: Service time is from 1330 to 1500   Dr. Rush Farmer is Medical Director for Pulmonary Rehab at [facility].

## 2018-06-18 ENCOUNTER — Encounter (HOSPITAL_COMMUNITY)
Admission: RE | Admit: 2018-06-18 | Discharge: 2018-06-18 | Disposition: A | Discharge status: Home / Self Care | Payer: No Typology Code available for payment source | Source: Ambulatory Visit | Attending: Pulmonary Disease | Admitting: Pulmonary Disease

## 2018-06-18 DIAGNOSIS — J449 Chronic obstructive pulmonary disease, unspecified: Secondary | ICD-10-CM

## 2018-06-18 NOTE — Progress Notes (Signed)
Pulmonary Individual Treatment Plan  Patient Details  Name: Steve Riley MRN: [id number] Date of Birth: 12/31/1950 Referring Provider:     Pulmonary Rehab Walk Test from 04/28/2018 in [place]  Referring Provider  Dr. Nelda Marseille      Initial Encounter Date:    Pulmonary Rehab Walk Test from 04/28/2018 in [place]  Date  04/28/18      Visit Diagnosis: Chronic obstructive pulmonary disease, unspecified COPD type ([place])  Patient's Home Medications on Admission:   Current Outpatient Medications:  .  acetaminophen (TYLENOL) 325 MG tablet, Take 2 tablets (650 mg total) by mouth every 6 (six) hours as needed for mild pain (or Fever >/= 101)., Disp: , Rfl:  .  albuterol (PROVENTIL HFA;VENTOLIN HFA) 108 (90 Base) MCG/ACT inhaler, Inhale 2 puffs into the lungs every 6 (six) hours as needed for wheezing., Disp: , Rfl:  .  Ascorbic Acid (VITAMIN C WITH ROSE HIPS) 1000 MG tablet, Take 1,000 mg by mouth daily., Disp: , Rfl:  .  B Complex-C-Folic Acid (HM SUPER VITAMIN B COMPLEX/C) TABS, Take by mouth., Disp: , Rfl:  .  budesonide-formoterol (SYMBICORT) 80-4.5 MCG/ACT inhaler, Inhale 2 puffs into the lungs 2 (two) times daily., Disp: , Rfl:  .  Calcium Carbonate-Vitamin D (CALCIUM 600/VITAMIN D) 600-400 MG-UNIT chew tablet, Chew 1 tablet by mouth daily., Disp: , Rfl:  .  Flurandrenolide 4 MCG/SQCM TAPE, Apply topically., Disp: , Rfl:  .  guaiFENesin (MUCINEX) 600 MG 12 hr tablet, Take 1 tablet (600 mg total) by mouth 2 (two) times daily. (Patient not taking: Reported on 04/28/2018), Disp: 20 tablet, Rfl: 0 .  ipratropium-albuterol (DUONEB) 0.5-2.5 (3) MG/3ML SOLN, Take 3 mLs by nebulization every 6 (six) hours as needed (wheezing)., Disp: 360 mL, Rfl: 0 .  levofloxacin (LEVAQUIN) 500 MG tablet, Take 1 tablet (500 mg total) by mouth daily. (Patient not taking: Reported on 04/28/2018), Disp: 2 tablet, Rfl: 0 .  meloxicam (MOBIC) 15 MG  tablet, Take 15 mg by mouth daily., Disp: , Rfl:  .  Methylsulfonylmethane (MSM) 1000 MG CAPS, Take 1,000 mg by mouth 2 (two) times daily., Disp: , Rfl:  .  Multiple Vitamins-Minerals (CENTRUM SILVER) tablet, Take 1 tablet by mouth daily., Disp: , Rfl:  .  Omega-3 1000 MG CAPS, Take 1 g by mouth 2 (two) times daily., Disp: , Rfl:  .  pantoprazole (PROTONIX) 40 MG tablet, Take 1 tablet (40 mg total) by mouth daily. (Patient not taking: Reported on 04/28/2018), Disp: 30 tablet, Rfl: 0 .  predniSONE (STERAPRED UNI-PAK 21 TAB) 10 MG (21) TBPK tablet, Use per package instruction (Patient not taking: Reported on 04/28/2018), Disp: 21 tablet, Rfl: 0 .  Thiamine HCl (VITAMIN B-1) 250 MG tablet, Take 250 mg by mouth daily., Disp: , Rfl:  .  traZODone (DESYREL) 100 MG tablet, Take 100 mg by mouth at bedtime., Disp: , Rfl:  .  Turmeric Curcumin 500 MG CAPS, Take 500 mg by mouth 2 (two) times daily., Disp: , Rfl:   Past Medical History: Past Medical History:  Diagnosis Date  . COPD (chronic obstructive pulmonary disease) ([place])   . Prostate cancer ([place])     Tobacco Use: Social History   Tobacco Use  Smoking Status Former Smoker  . Packs/day: 3.00  . Years: 54.00  . Pack years: 162.00  . Types: Cigarettes  . Last attempt to quit: 07/11/2016  . Years since quitting: 1.9  Smokeless Tobacco Never Used  Labs: Recent Review Flowsheet Data    Labs for ITP Cardiac and Pulmonary Rehab Latest Ref Rng & Units 03/06/2018 03/07/2018   Hemoglobin A1c 4.8 - 5.6 % - 6.5(H)   HCO3 20.0 - 28.0 mmol/L 22.9 -   TCO2 22 - 32 mmol/L 24 -   ACIDBASEDEF 0.0 - 2.0 mmol/L 1.0 -   O2SAT % 81.0 -      Capillary Blood Glucose: Lab Results  Component Value Date   GLUCAP 86 03/09/2018   GLUCAP 104 (H) 03/09/2018   GLUCAP 139 (H) 03/08/2018   GLUCAP 225 (H) 03/07/2018     Pulmonary Assessment Scores: Pulmonary Assessment Scores    Row Name 04/28/18 1629 04/30/18 0705       ADL UCSD   ADL Phase  Entry   Entry    SOB Score total  74  -      CAT Score   CAT Score  21  -      mMRC Score   mMRC Score  -  3       Pulmonary Function Assessment:   Exercise Target Goals: Exercise Program Goal: Individual exercise prescription set using results from initial 6 min walk test and THRR while considering  patient's activity barriers and safety.   Exercise Prescription Goal: Initial exercise prescription builds to 30-45 minutes a day of aerobic activity, 2-3 days per week.  Home exercise guidelines will be given to patient during program as part of exercise prescription that the participant will acknowledge.  Activity Barriers & Risk Stratification: Activity Barriers & Cardiac Risk Stratification - 04/24/18 1031      Activity Barriers & Cardiac Risk Stratification   Activity Barriers  Balance Concerns;Deconditioning;Assistive Device;Shortness of Breath;Muscular Weakness;Back Problems   right hip discomfort necrotic femur      6 Minute Walk: 6 Minute Walk    Row Name 04/30/18 0705         6 Minute Walk   Phase  Initial     Distance  500 feet     Walk Time  3.5 minutes     # of Rest Breaks  1     MPH  0.95     METS  1.77     RPE  11     Perceived Dyspnea   1     Symptoms  Yes (comment)     Comments  Pt was in the restroom for the last 2.5 minutes of test. I would have put O2 on him if not, as he desaturated down to 83% on RA. Pt reported low perceptual values although he was visiablly out of breath even 5 minutes post exercise.      Resting HR  70 bpm     Resting BP  124/82     Resting Oxygen Saturation   95 %     Exercise Oxygen Saturation  during 6 min walk  83 %     Max Ex. HR  94 bpm     Max Ex. BP  144/72     2 Minute Post BP  116/78       Interval HR   1 Minute HR  88     2 Minute HR  92     3 Minute HR  92     6 Minute HR  94     2 Minute Post HR  74     Interval Heart Rate?  Yes       Interval Oxygen   Interval  Oxygen?  Yes     Baseline Oxygen Saturation %  95  %     1 Minute Oxygen Saturation %  93 %     1 Minute Liters of Oxygen  0 L     2 Minute Oxygen Saturation %  87 %     2 Minute Liters of Oxygen  0 L     3 Minute Oxygen Saturation %  83 %     3 Minute Liters of Oxygen  0 L     6 Minute Oxygen Saturation %  90 %     6 Minute Liters of Oxygen  0 L     2 Minute Post Oxygen Saturation %  96 %     2 Minute Post Liters of Oxygen  0 L        Oxygen Initial Assessment: Oxygen Initial Assessment - 04/30/18 0703      Home Oxygen   Home Oxygen Device  None    Sleep Oxygen Prescription  None    Home Exercise Oxygen Prescription  None    Home at Rest Exercise Oxygen Prescription  None      Initial 6 min Walk   Oxygen Used  None      Program Oxygen Prescription   Program Oxygen Prescription  E-Tanks    Liters per minute  2    Comments  During walk test on RA, pt desaturated down to 83%. I was going to put O2 on pt for the remainder of walk test, but pt went to the rest room for the rest of the test. Will start at 2L, but may have to increase.      Intervention   Short Term Goals  To learn and exhibit compliance with exercise, home and travel O2 prescription;To learn and understand importance of maintaining oxygen saturations>88%;To learn and demonstrate proper use of respiratory medications;To learn and understand importance of monitoring SPO2 with pulse oximeter and demonstrate accurate use of the pulse oximeter.;To learn and demonstrate proper pursed lip breathing techniques or other breathing techniques.    Long  Term Goals  Verbalizes importance of monitoring SPO2 with pulse oximeter and return demonstration;Exhibits proper breathing techniques, such as pursed lip breathing or other method taught during program session;Exhibits compliance with exercise, home and travel O2 prescription;Maintenance of O2 saturations>88%;Compliance with respiratory medication;Demonstrates proper use of MDI's       Oxygen Re-Evaluation: Oxygen  Re-Evaluation    Row Name 05/20/18 0858 06/16/18 0843           Program Oxygen Prescription   Program Oxygen Prescription  E-Tanks  E-Tanks      Liters per minute  2  2        Home Oxygen   Home Oxygen Device  None  None [facility]      Sleep Oxygen Prescription  -  None      Home Exercise Oxygen Prescription  Continuous  Continuous      Liters per minute  2 Pt has not been supplied with home oxygen yet, so I will not advise on home exercise until home oxygen is obtained.   2      Home at Rest Exercise Oxygen Prescription  None  -        Goals/Expected Outcomes   Short Term Goals  To learn and exhibit compliance with exercise, home and travel O2 prescription;To learn and understand importance of maintaining oxygen saturations>88%;To learn and  demonstrate proper use of respiratory medications;To learn and understand importance of monitoring SPO2 with pulse oximeter and demonstrate accurate use of the pulse oximeter.;To learn and demonstrate proper pursed lip breathing techniques or other breathing techniques.  To learn and exhibit compliance with exercise, home and travel O2 prescription;To learn and understand importance of maintaining oxygen saturations>88%;To learn and demonstrate proper use of respiratory medications;To learn and understand importance of monitoring SPO2 with pulse oximeter and demonstrate accurate use of the pulse oximeter.;To learn and demonstrate proper pursed lip breathing techniques or other breathing techniques.      Long  Term Goals  -  Verbalizes importance of monitoring SPO2 with pulse oximeter and return demonstration;Exhibits proper breathing techniques, such as pursed lip breathing or other method taught during program session;Exhibits compliance with exercise, home and travel O2 prescription;Maintenance of O2 saturations>88%;Compliance with respiratory medication;Demonstrates proper use of MDI's      Comments  Setting up home  oxygen. Expect him to have it soon.  Setting up home oxygen. Expect him to have it soon.      Goals/Expected Outcomes  compliance with home oxygen when obtained.  compliance with home oxygen when obtained.         Oxygen Discharge (Final Oxygen Re-Evaluation): Oxygen Re-Evaluation - 06/16/18 0843      Program Oxygen Prescription   Program Oxygen Prescription  E-Tanks    Liters per minute  2      Home Oxygen   Home Oxygen Device  None   [facility]   Sleep Oxygen Prescription  None    Home Exercise Oxygen Prescription  Continuous    Liters per minute  2      Goals/Expected Outcomes   Short Term Goals  To learn and exhibit compliance with exercise, home and travel O2 prescription;To learn and understand importance of maintaining oxygen saturations>88%;To learn and demonstrate proper use of respiratory medications;To learn and understand importance of monitoring SPO2 with pulse oximeter and demonstrate accurate use of the pulse oximeter.;To learn and demonstrate proper pursed lip breathing techniques or other breathing techniques.    Long  Term Goals  Verbalizes importance of monitoring SPO2 with pulse oximeter and return demonstration;Exhibits proper breathing techniques, such as pursed lip breathing or other method taught during program session;Exhibits compliance with exercise, home and travel O2 prescription;Maintenance of O2 saturations>88%;Compliance with respiratory medication;Demonstrates proper use of MDI's    Comments  Setting up home oxygen. Expect him to have it soon.    Goals/Expected Outcomes  compliance with home oxygen when obtained.       Initial Exercise Prescription: Initial Exercise Prescription - 04/30/18 0700      Date of Initial Exercise RX and Referring Provider   Date  04/28/18    Referring Provider  Dr. Nelda Marseille      Oxygen   Oxygen  Continuous    Liters  2      NuStep   Level  2    SPM  80    Minutes  17      Arm  Ergometer   Level  2    Minutes  17      Track   Laps  8    Minutes  17      Prescription Details   Frequency (times per week)  2    Duration  Progress to 45 minutes of aerobic exercise without signs/symptoms of physical distress      Intensity   THRR  40-80% of Max Heartrate  60-121    Ratings of Perceived Exertion  11-13    Perceived Dyspnea  0-4      Progression   Progression  Continue to progress workloads to maintain intensity without signs/symptoms of physical distress.      Resistance Training   Training Prescription  Yes    Weight  blue bands    Reps  10-15       Perform Capillary Blood Glucose checks as needed.  Exercise Prescription Changes: Exercise Prescription Changes    Row Name 05/05/18 1500 05/19/18 1500 06/02/18 1200 06/16/18 1500       Response to Exercise   Blood Pressure (Admit)  140/76  112/70  120/70  132/80    Blood Pressure (Exercise)  120/80  124/78  148/64  146/68    Blood Pressure (Exit)  118/70  122/78  138/70  102/72    Heart Rate (Admit)  88 bpm  73 bpm  80 bpm  90 bpm    Heart Rate (Exercise)  98 bpm  111 bpm  111 bpm  94 bpm    Heart Rate (Exit)  86 bpm  87 bpm  82 bpm  83 bpm    Oxygen Saturation (Admit)  92 %  96 %  95 %  96 %    Oxygen Saturation (Exercise)  97 %  95 %  92 %  94 %    Oxygen Saturation (Exit)  95 %  99 %  96 %  92 %    Rating of Perceived Exertion (Exercise)  '13  11  14  13    '$ Perceived Dyspnea (Exercise)  '2  3  3  3    '$ Duration  Progress to 45 minutes of aerobic exercise without signs/symptoms of physical distress  Progress to 45 minutes of aerobic exercise without signs/symptoms of physical distress  Progress to 45 minutes of aerobic exercise without signs/symptoms of physical distress  Progress to 45 minutes of aerobic exercise without signs/symptoms of physical distress    Intensity  Other (comment) 40-80% of HRR  THRR unchanged  THRR unchanged  THRR unchanged      Progression   Progression  Continue to progress  workloads to maintain intensity without signs/symptoms of physical distress.  Continue to progress workloads to maintain intensity without signs/symptoms of physical distress.  Continue to progress workloads to maintain intensity without signs/symptoms of physical distress.  Continue to progress workloads to maintain intensity without signs/symptoms of physical distress.      Resistance Training   Training Prescription  Yes  Yes  Yes  Yes    Weight  blue bands  blue bands  blue bands  blue bands    Reps  10-15  10-15  10-15  10-15    Time  10 Minutes  10 Minutes  10 Minutes  10 Minutes      Oxygen   Oxygen  Continuous  Continuous  Continuous  Continuous    Liters  '2  2  2  2      '$ NuStep   Level  '2  3  4  4    '$ SPM  80  80  80  80    Minutes  '17  17  17  '$ 34    METs  1.5  1.8  1.7  2      Arm Ergometer   Level  2  2.5  2.5  2.5    Minutes  17  17  [place]  -  -  -       Exercise Comments:   Exercise Goals and Review: Exercise Goals    Row Name 04/24/18 1029             Exercise Goals   Increase Physical Activity  Yes       Intervention  Provide advice, education, support and counseling about physical activity/exercise needs.;Develop an individualized exercise prescription for aerobic and resistive training based on initial evaluation findings, risk stratification, comorbidities and participant's personal goals.       Expected Outcomes  Short Term: Attend rehab on a regular basis to increase amount of physical activity.;Long Term: Exercising regularly at least 3-5 days a week.;Long Term: Add in home exercise to make exercise part of routine and to increase amount of physical activity.       Increase Strength and Stamina  Yes       Intervention  Provide advice, education, support and counseling about physical activity/exercise needs.;Develop an individualized exercise prescription for aerobic and resistive training based on initial  evaluation findings, risk stratification, comorbidities and participant's personal goals.       Expected Outcomes  Short Term: Increase workloads from initial exercise prescription for resistance, speed, and METs.;Long Term: Improve cardiorespiratory fitness, muscular endurance and strength as measured by increased METs and functional capacity (6MWT);Short Term: Perform resistance training exercises routinely during rehab and add in resistance training at home       Able to understand and use rate of perceived exertion (RPE) scale  Yes       Intervention  Provide education and explanation on how to use RPE scale       Expected Outcomes  Short Term: Able to use RPE daily in rehab to express subjective intensity level;Long Term:  Able to use RPE to guide intensity level when exercising independently       Able to understand and use Dyspnea scale  Yes       Intervention  Provide education and explanation on how to use Dyspnea scale       Expected Outcomes  Short Term: Able to use Dyspnea scale daily in rehab to express subjective sense of shortness of breath during exertion;Long Term: Able to use Dyspnea scale to guide intensity level when exercising independently       Knowledge and understanding of Target Heart Rate Range (THRR)  Yes       Intervention  Provide education and explanation of THRR including how the numbers were predicted and where they are located for reference       Expected Outcomes  Short Term: Able to state/look up THRR;Long Term: Able to use THRR to govern intensity when exercising independently       Understanding of Exercise Prescription  Yes       Intervention  Provide education, explanation, and written materials on patient's individual exercise prescription       Expected Outcomes  Short Term: Able to explain program exercise prescription;Long Term: Able to explain home exercise prescription to exercise independently          Exercise Goals Re-Evaluation : Exercise Goals  Re-Evaluation    Row Name 05/20/18 0900 06/16/18 0844           Exercise Goal Re-Evaluation   Exercise Goals Review  Increase Physical Activity;Increase Strength and Stamina;Able to  understand and use rate of perceived exertion (RPE) scale;Able to understand and use Dyspnea scale;Knowledge and understanding of Target Heart Rate Range (THRR);Understanding of Exercise Prescription  Increase Physical Activity;Increase Strength and Stamina;Able to understand and use rate of perceived exertion (RPE) scale;Able to understand and use Dyspnea scale;Knowledge and understanding of Target Heart Rate Range (THRR);Understanding of Exercise Prescription      Comments  Pt has completed 4 exercise sessions. Pt needs oxygen at home, so I will not advise on home exercise at this time. Pt has hip pain that at times he rates 10/10. Because of this I have removed him from walking the track, and his primary exercise comes from the arm ergometer. An appointment for his hip pain is in the works. Progress will be hindered until the hip pain is resolved. Will continue to progress and monitor as able.   Pt is highly motivated to exercise, but has just recently been informed that he will need a total hip replacement. Pt is accepting of workload increases. Pt exercises at low MET levels (2.2). Will continue to monitor and progress as able.       Expected Outcomes  Through exercise at rehab and at home, the patient will decrease shortness of breath with daily activities and feel confident in carrying out an exercise regime at home.   Through exercise at rehab and at home, the patient will decrease shortness of breath with daily activities and feel confident in carrying out an exercise regime at home.          Discharge Exercise Prescription (Final Exercise Prescription Changes): Exercise Prescription Changes - 06/16/18 1500      Response to Exercise   Blood Pressure (Admit)  132/80    Blood Pressure (Exercise)  146/68    Blood  Pressure (Exit)  102/72    Heart Rate (Admit)  90 bpm    Heart Rate (Exercise)  94 bpm    Heart Rate (Exit)  83 bpm    Oxygen Saturation (Admit)  96 %    Oxygen Saturation (Exercise)  94 %    Oxygen Saturation (Exit)  92 %    Rating of Perceived Exertion (Exercise)  13    Perceived Dyspnea (Exercise)  3    Duration  Progress to 45 minutes of aerobic exercise without signs/symptoms of physical distress    Intensity  THRR unchanged      Progression   Progression  Continue to progress workloads to maintain intensity without signs/symptoms of physical distress.      Resistance Training   Training Prescription  Yes    Weight  blue bands    Reps  10-15    Time  10 Minutes      Oxygen   Oxygen  Continuous    Liters  2      NuStep   Level  4    SPM  80    Minutes  34    METs  2      Arm Ergometer   Level  2.5    Minutes  17       Nutrition:  Target Goals: Understanding of nutrition guidelines, daily intake of sodium '1500mg'$ , cholesterol '200mg'$ , calories 30% from fat and 7% or less from saturated fats, daily to have 5 or more servings of fruits and vegetables.  Biometrics:    Nutrition Therapy Plan and Nutrition Goals: Nutrition Therapy & Goals - 05/14/18 1525      Nutrition Therapy   Diet  diabetic  Personal Nutrition Goals   Nutrition Goal  pt able to name foods that affect blood glucose    Personal Goal #2  Pt to identify and limit food sources of sodium, and refined carbohydrates    Personal Goal #3  Identify food quantities necessary to achieve wt loss of  -2# per week to a goal wt loss of 6-24 lb at graduation from pulmonary rehab.    Personal Goal #4  Describe the benefit of including fruits, vegetables, whole grains, and low-fat dairy products in a healthy meal plan.      Intervention Plan   Intervention  Prescribe, educate and counsel regarding individualized specific dietary modifications aiming towards targeted core components such as weight,  hypertension, lipid management, diabetes, heart failure and other comorbidities.    Expected Outcomes  Short Term Goal: Understand basic principles of dietary content, such as calories, fat, sodium, cholesterol and nutrients.;Long Term Goal: Adherence to prescribed nutrition plan.       Nutrition Assessments: Nutrition Assessments - 05/08/18 1254      Rate Your Plate Scores   Pre Score  41       Nutrition Goals Re-Evaluation:   Nutrition Goals Discharge (Final Nutrition Goals Re-Evaluation):   Psychosocial: Target Goals: Acknowledge presence or absence of significant depression and/or stress, maximize coping skills, provide positive support system. Participant is able to verbalize types and ability to use techniques and skills needed for reducing stress and depression.  Initial Review & Psychosocial Screening: Initial Psych Review & Screening - 04/24/18 1144      Initial Review   Current issues with  Current Stress Concerns    Source of Stress Concerns  Unable to perform yard/household activities;Family;Financial;Unable to participate in former interests or hobbies    Comments  Pt with multiple psychosocial issues.  Pt is tearful today which is not like him.  This causes him stress and strain that he appears "weak"      Family Dynamics   Comments  ex wife and her husband are supportive of [place].  Son helps with getting groceries into the house and put away.  Pt is estranged from his daughter       Quality of Life Scores:  Scores of 19 and below usually indicate a poorer quality of life in these areas.  A difference of  2-3 points is a clinically meaningful difference.  A difference of 2-3 points in the total score of the Quality of Life Index has been associated with significant improvement in overall quality of life, self-image, physical symptoms, and general health in studies assessing change in quality of life.  PHQ-9: Recent Review Flowsheet Data    Depression screen [facility]  2/9 04/24/2018 04/24/2018 07/02/2017 03/13/2017   Decreased Interest 0 1 0 0   Down, Depressed, Hopeless 0 1 0 0   PHQ - 2 Score 0 2 0 0   Altered sleeping 0 0 - -   Tired, decreased energy 3 3 - -   Change in appetite 0 0 - -   Feeling bad or failure about yourself  2 1 - -   Trouble concentrating 1 1 - -   Moving slowly or fidgety/restless 0 0 - -   Suicidal thoughts 0 0 - -   PHQ-9 Score 6 7 - -   Difficult doing work/chores - Very difficult - -     Interpretation of Total Score  Total Score Depression Severity:  1-4 = Minimal depression, 5-9 = Mild depression, 10-14 = Moderate  depression, 15-19 = Moderately severe depression, 20-27 = Severe depression   Psychosocial Evaluation and Intervention: Psychosocial Evaluation - 04/24/18 1146      Psychosocial Evaluation & Interventions   Interventions  Stress management education;Relaxation education;Encouraged to exercise with the program and follow exercise prescription    Comments  pt with multiple issues that he is dealing with such as intrafamilial strain,financial stress and feelings of low self worth.    Expected Outcomes  Pt will report and employ positive and healthy techniques for stress management.    Continue Psychosocial Services   Follow up required by staff       Psychosocial Re-Evaluation: Psychosocial Re-Evaluation    [place] Name 05/19/18 5956 06/16/18 1631           Psychosocial Re-Evaluation   Current issues with  Current Stress Concerns  Current Stress Concerns      Comments  Seems to be enjoying the program, he seems to be handling his stress well  Is being worked up for a hip replacement, he is in quite a bit of pain upon standing, [facility].      Expected Outcomes  continue to deal with his stress in appropriate ways  assist patient in preparing for possible hip replacement      Interventions  -  Encouraged to attend Pulmonary Rehabilitation for the  exercise;Relaxation education;Stress management education      Continue Psychosocial Services   Follow up required by staff  Follow up required by staff      Comments  has attended 3 exercise sessions, is dealing with severe hip pain and has been encouraged to see PCP for evaluation  Is enjoying the program, despite hip pain he rarely misses a class        Initial Review   Source of Stress Concerns  Chronic Illness;Family;Unable to participate in former interests or hobbies  Chronic Illness;Family;Unable to participate in former interests or hobbies         Psychosocial Discharge (Final Psychosocial Re-Evaluation): Psychosocial Re-Evaluation - 06/16/18 1631      Psychosocial Re-Evaluation   Current issues with  Current Stress Concerns    Comments  Is being worked up for a hip replacement, he is in quite a bit of pain upon standing, [facility] for [facility].    Expected Outcomes  assist patient in preparing for possible hip replacement    Interventions  Encouraged to attend Pulmonary Rehabilitation for the exercise;Relaxation education;Stress management education    Continue Psychosocial Services   Follow up required by staff    Comments  Is enjoying the program, despite hip pain he rarely misses a class      Initial Review   Source of Stress Concerns  Chronic Illness;Family;Unable to participate in former interests or hobbies       Education: Education Goals: Education classes will be provided on a weekly basis, covering required topics. Participant will state understanding/return demonstration of topics presented.  Learning Barriers/Preferences: Learning Barriers/Preferences - 04/24/18 1035      Learning Barriers/Preferences   Learning Barriers  Sight;Hearing    Learning Preferences  Written Material;Individual Instruction       Education Topics: Risk Factor Reduction:  -Group instruction that is supported by a PowerPoint  presentation. Instructor discusses the definition of a risk factor, different risk factors for pulmonary disease, and how the heart and lungs work together.     Nutrition  for Pulmonary Patient:  -Group instruction provided by PowerPoint slides, verbal discussion, and written materials to support subject matter. The instructor gives an explanation and review of healthy diet recommendations, which includes a discussion on weight management, recommendations for fruit and vegetable consumption, as well as protein, fluid, caffeine, fiber, sodium, sugar, and alcohol. Tips for eating when patients are short of breath are discussed.   Pursed Lip Breathing:  -Group instruction that is supported by demonstration and informational handouts. Instructor discusses the benefits of pursed lip and diaphragmatic breathing and detailed demonstration on how to preform both.     Oxygen Safety:  -Group instruction provided by PowerPoint, verbal discussion, and written material to support subject matter. There is an overview of "What is Oxygen" and "Why do we need it".  Instructor also reviews how to create a safe environment for oxygen use, the importance of using oxygen as prescribed, and the risks of noncompliance. There is a brief discussion on traveling with oxygen and resources the patient may utilize.   PULMONARY REHAB CHRONIC OBSTRUCTIVE PULMONARY DISEASE from 06/11/2018 in [place]  Date  05/28/18  Educator  Remo Lipps  Instruction Review Code  1- Verbalizes Understanding      Oxygen Equipment:  -Group instruction provided by [employer] utilizing handouts, written materials, and Insurance underwriter.   Signs and Symptoms:  -Group instruction provided by written material and verbal discussion to support subject matter. Warning signs and symptoms of infection, stroke, and heart attack are reviewed and when to call the physician/911 reinforced. Tips for preventing the  spread of infection discussed.   PULMONARY REHAB CHRONIC OBSTRUCTIVE PULMONARY DISEASE from 06/11/2018 in [place]  Date  06/11/18  Educator   Remo Lipps  Instruction Review Code  1- Verbalizes Understanding      Advanced Directives:  -Group instruction provided by verbal instruction and written material to support subject matter. Instructor reviews Advanced Directive laws and proper instruction for filling out document.   Pulmonary Video:  -Group video education that reviews the importance of medication and oxygen compliance, exercise, good nutrition, pulmonary hygiene, and pursed lip and diaphragmatic breathing for the pulmonary patient.   Exercise for the Pulmonary Patient:  -Group instruction that is supported by a PowerPoint presentation. Instructor discusses benefits of exercise, core components of exercise, frequency, duration, and intensity of an exercise routine, importance of utilizing pulse oximetry during exercise, safety while exercising, and options of places to exercise outside of rehab.     Pulmonary Medications:  -Verbally interactive group education provided by instructor with focus on inhaled medications and proper administration.   PULMONARY REHAB CHRONIC OBSTRUCTIVE PULMONARY DISEASE from 06/11/2018 in [place]  Date  05/12/18  Educator  Anderson Malta  Instruction Review Code  1- Verbalizes Understanding      Anatomy and Physiology of the Respiratory System and Intimacy:  -Group instruction provided by PowerPoint, verbal discussion, and written material to support subject matter. Instructor reviews respiratory cycle and anatomical components of the respiratory system and their functions. Instructor also reviews differences in obstructive and restrictive respiratory diseases with examples of each. Intimacy, Sex, and Sexuality differences are reviewed with a discussion on how relationships can change when diagnosed  with pulmonary disease. Common sexual concerns are reviewed.   MD DAY -A group question and answer session with a medical doctor that allows participants to ask questions that relate to their pulmonary disease state.   OTHER EDUCATION -Group or individual  verbal, written, or video instructions that support the educational goals of the pulmonary rehab program.   PULMONARY REHAB CHRONIC OBSTRUCTIVE PULMONARY DISEASE from 06/11/2018 in [place]  Date  06/04/18 [Resolutions]  Educator  Dalton  Instruction Review Code  1- Verbalizes Understanding      Holiday Eating Survival Tips:  -Group instruction provided by [employer], verbal discussion, and written materials to support subject matter. The instructor gives patients tips, tricks, and techniques to help them not only survive but enjoy the holidays despite the onslaught of food that accompanies the holidays.   Knowledge Questionnaire Score: Knowledge Questionnaire Score - 04/28/18 1629      Knowledge Questionnaire Score   Pre Score  12/18       Core Components/Risk Factors/Patient Goals at Admission: Personal Goals and Risk Factors at Admission - 04/24/18 1036      Core Components/Risk Factors/Patient Goals on Admission    Weight Management  Yes    Intervention  Obesity: Provide education and appropriate resources to help participant work on and attain dietary goals.;Weight Management/Obesity: Establish reasonable short term and long term weight goals.;Weight Management: Develop a combined nutrition and exercise program designed to reach desired caloric intake, while maintaining appropriate intake of nutrient and fiber, sodium and fats, and appropriate energy expenditure required for the weight goal.    Admit Weight  200 lb 11.2 oz (91 kg)    Goal Weight: Short Term  190 lb (86.2 kg)    Goal Weight: Long Term  175 lb (79.4 kg)    Expected Outcomes  Short Term: Continue to assess and modify  interventions until short term weight is achieved;Long Term: Adherence to nutrition and physical activity/exercise program aimed toward attainment of established weight goal;Understanding of distribution of calorie intake throughout the day with the consumption of 4-5 meals/snacks;Understanding recommendations for meals to include 15-35% energy as protein, 25-35% energy from fat, 35-60% energy from carbohydrates, less than '200mg'$  of dietary cholesterol, 20-35 gm of total fiber daily;Weight Loss: Understanding of general recommendations for a balanced deficit meal plan, which promotes 1-2 lb weight loss per week and includes a negative energy balance of [phone] kcal/d    Improve shortness of breath with ADL's  Yes    Intervention  Provide education, individualized exercise plan and daily activity instruction to help decrease symptoms of SOB with activities of daily living.    Expected Outcomes  Short Term: Improve cardiorespiratory fitness to achieve a reduction of symptoms when performing ADLs;Long Term: Be able to perform more ADLs without symptoms or delay the onset of symptoms    Stress  Yes    Intervention  Offer individual and/or small group education and counseling on adjustment to heart disease, stress management and health-related lifestyle change. Teach and support self-help strategies.    Expected Outcomes  Short Term: Participant demonstrates changes in health-related behavior, relaxation and other stress management skills, ability to obtain effective social support, and compliance with psychotropic medications if prescribed.;Long Term: Emotional wellbeing is indicated by absence of clinically significant psychosocial distress or social isolation.       Core Components/Risk Factors/Patient Goals Review:  Goals and Risk Factor Review    Row Name 05/19/18 0816 06/16/18 1634           Core Components/Risk Factors/Patient Goals Review   Personal Goals Review  Weight  Management/Obesity;Improve shortness of breath with ADL's;Increase knowledge of respiratory medications and ability to use respiratory devices properly.;Develop more efficient breathing techniques such as purse  lipped breathing and diaphragmatic breathing and practicing self-pacing with activity.  Weight Management/Obesity;Improve shortness of breath with ADL's;Increase knowledge of respiratory medications and ability to use respiratory devices properly.;Develop more efficient breathing techniques such as purse lipped breathing and diaphragmatic breathing and practicing self-pacing with activity.      Review  Has attended 3 exercise sessions, too early to have met goals, have contacted the [place] multiple times to set up home oxygen for him to use when he exercises, also dealing with severe hip pain and encouraged to see his PCP for evaluation.  Is limited in what he can do d/t hip pain.  Is being referred to an orthopedic surgeon, has not lost any weight, seems happy to be in the program, enjoys the interaction with piers      Expected Outcomes  see admission core components  see admission core components         Core Components/Risk Factors/Patient Goals at Discharge (Final Review):  Goals and Risk Factor Review - 06/16/18 1634      Core Components/Risk Factors/Patient Goals Review   Personal Goals Review  Weight Management/Obesity;Improve shortness of breath with ADL's;Increase knowledge of respiratory medications and ability to use respiratory devices properly.;Develop more efficient breathing techniques such as purse lipped breathing and diaphragmatic breathing and practicing self-pacing with activity.    Review  Is limited in what he can do d/t hip pain.  Is being referred to an orthopedic surgeon, has not lost any weight, seems happy to be in the program, enjoys the interaction with piers    Expected Outcomes  see admission core components       ITP Comments: ITP Comments    Row Name 04/24/18  1012           ITP Comments  Dr. Manfred Arch Medical Director Pulmonary Rehab          Comments: ITP REVIEW Pt is making expected progress toward pulmonary rehab goals after completing 12 sessions. Recommend continued exercise, life style modification, education, and utilization of breathing techniques to increase stamina and strength and decrease shortness of breath with exertion.

## 2018-06-18 NOTE — Progress Notes (Signed)
Daily Session Note  Patient Details  Name: Steve Riley MRN: [id number] Date of Birth: 09/17/1950 Referring Provider:     Pulmonary Rehab Walk Test from 04/28/2018 in [facility]  Referring Provider  Dr. Yacoub      Encounter Date: 06/18/2018  Check In: Session Check In - 06/18/18 1346      Check-In   Supervising physician immediately available to respond to emergencies  [facility] immediately available    Physician(s)  Dr. Grunz    Location  [facility]    Staff Present  Lindsay Agnew RN, BSN;Joan Behrens, RN, BSN;Lisa Hughes, RN;Dalton Fletcher, MS, Exercise Physiologist    Medication changes reported      No    Fall or balance concerns reported     No    Tobacco Cessation  No Change    Warm-up and Cool-down  Performed as group-led instruction    Resistance Training Performed  Yes    VAD Patient?  No    PAD/SET Patient?  No      Pain Assessment   Currently in Pain?  No/denies    Pain Score  0-No pain    Multiple Pain Sites  No       Capillary Blood Glucose: No results found for this or any previous visit (from the past 24 hour(s)).    Social History   Tobacco Use  Smoking Status Former Smoker  . Packs/day: 3.00  . Years: 54.00  . Pack years: 162.00  . Types: Cigarettes  . Last attempt to quit: 07/11/2016  . Years since quitting: 1.9  Smokeless Tobacco Never Used    Goals Met:  Proper associated with RPD/PD & O2 Sat Exercise tolerated well Strength training completed today  Goals Unmet:  Not Applicable  Comments: Service time is from 1330 to 1510    Dr. Wesam G. Yacoub is Medical Director for Pulmonary Rehab at [facility]. 

## 2018-06-23 ENCOUNTER — Telehealth (HOSPITAL_COMMUNITY): Payer: Self-pay

## 2018-06-23 ENCOUNTER — Encounter (HOSPITAL_COMMUNITY): Payer: No Typology Code available for payment source

## 2018-06-25 ENCOUNTER — Encounter (HOSPITAL_COMMUNITY)
Admission: RE | Admit: 2018-06-25 | Discharge: 2018-06-25 | Disposition: A | Discharge status: Home / Self Care | Payer: No Typology Code available for payment source | Source: Ambulatory Visit | Attending: Pulmonary Disease | Admitting: Pulmonary Disease

## 2018-06-25 DIAGNOSIS — J449 Chronic obstructive pulmonary disease, unspecified: Secondary | ICD-10-CM | POA: Diagnosis not present

## 2018-06-25 NOTE — Progress Notes (Signed)
Daily Session Note  Patient Details  Name: Steve Riley MRN: [id number] Date of Birth: 06-28-1950 Referring Provider:     Pulmonary Rehab Walk Test from 04/28/2018 in [place]  Referring Provider  Dr. Nelda Marseille      Encounter Date: 06/25/2018  Check In: Session Check In - 06/25/18 1325      Check-In   Supervising physician immediately available to respond to emergencies  [facility] immediately available    Physician(s)  Dr. Waldron Labs    Location  [facility]    Staff Present  [name] RN, BSN;Camron Monday Leonia Reeves, RN, BSN;Lisa Ysidro Evert, RN;Dalton Fletcher, MS, Exercise Physiologist    Medication changes reported      No    Fall or balance concerns reported     No    Tobacco Cessation  No Change    Warm-up and Cool-down  Performed as group-led instruction    Resistance Training Performed  Yes    VAD Patient?  No    PAD/SET Patient?  No      Pain Assessment   Currently in Pain?  No/denies    Pain Score  0-No pain    Multiple Pain Sites  No       Capillary Blood Glucose: No results found for this or any previous visit (from the past 24 hour(s)).    Social History   Tobacco Use  Smoking Status Former Smoker  . Packs/day: 3.00  . Years: 54.00  . Pack years: 162.00  . Types: Cigarettes  . Last attempt to quit: 07/11/2016  . Years since quitting: 1.9  Smokeless Tobacco Never Used    Goals Met:  Proper associated with RPD/PD & O2 Sat Exercise tolerated well Strength training completed today  Goals Unmet:  Not Applicable  Comments: Service time is from 1330 to 1500    Dr. Rush Farmer is Medical Director for Pulmonary Rehab at [facility].

## 2018-06-30 ENCOUNTER — Encounter (HOSPITAL_COMMUNITY)
Admission: RE | Admit: 2018-06-30 | Discharge: 2018-06-30 | Disposition: A | Discharge status: Home / Self Care | Payer: No Typology Code available for payment source | Source: Ambulatory Visit | Attending: Pulmonary Disease | Admitting: Pulmonary Disease

## 2018-06-30 VITALS — Wt 200.8 lb

## 2018-06-30 DIAGNOSIS — J449 Chronic obstructive pulmonary disease, unspecified: Secondary | ICD-10-CM | POA: Diagnosis not present

## 2018-06-30 NOTE — Progress Notes (Signed)
Daily Session Note  Patient Details  Name: Steve Riley MRN: [id number] Date of Birth: 01/31/1951 Referring Provider:     Pulmonary Rehab Walk Test from 04/28/2018 in [place]  Referring Provider  Dr. Nelda Marseille      Encounter Date: 06/30/2018  Check In: Session Check In - 06/30/18 1330      Check-In   Supervising physician immediately available to respond to emergencies  [facility] immediately available    Physician(s)  Dr. Waldron Labs    Location  [facility]    Staff Present  [name] RN, BSN;Carlette Wilber Oliphant, RN, Bjorn Loser, MS, Exercise Physiologist;Joan Leonia Reeves, RN, BSN    Medication changes reported      No    Fall or balance concerns reported     No    Warm-up and Cool-down  Performed as group-led instruction    Resistance Training Performed  Yes    VAD Patient?  No    PAD/SET Patient?  No      Pain Assessment   Multiple Pain Sites  No       Capillary Blood Glucose: No results found for this or any previous visit (from the past 24 hour(s)).  Exercise Prescription Changes - 06/30/18 1600      Response to Exercise   Blood Pressure (Admit)  120/80    Blood Pressure (Exercise)  140/86    Blood Pressure (Exit)  130/70    Heart Rate (Admit)  99 bpm    Heart Rate (Exercise)  120 bpm    Heart Rate (Exit)  111 bpm    Oxygen Saturation (Admit)  93 %    Oxygen Saturation (Exercise)  93 %    Oxygen Saturation (Exit)  95 %    Rating of Perceived Exertion (Exercise)  13    Perceived Dyspnea (Exercise)  3    Duration  Progress to 45 minutes of aerobic exercise without signs/symptoms of physical distress    Intensity  THRR unchanged      Progression   Progression  Continue to progress workloads to maintain intensity without signs/symptoms of physical distress.      Resistance Training   Training Prescription  Yes    Weight  blue bands    Reps  10-15    Time  10 Minutes      Oxygen   Oxygen   Continuous    Liters  2      NuStep   Level  43    SPM  80    Minutes  17    METs  1.9      Arm Ergometer   Level  3    Minutes  17       Social History   Tobacco Use  Smoking Status Former Smoker  . Packs/day: 3.00  . Years: 54.00  . Pack years: 162.00  . Types: Cigarettes  . Last attempt to quit: 07/11/2016  . Years since quitting: 1.9  Smokeless Tobacco Never Used    Goals Met:  Proper associated with RPD/PD & O2 Sat Exercise tolerated well Strength training completed today  Goals Unmet:  Not Applicable  Comments: Service time is from 1330 to 1500   Dr. Rush Farmer is Medical Director for Pulmonary Rehab at [facility].

## 2018-07-02 ENCOUNTER — Encounter (HOSPITAL_COMMUNITY)
Admission: RE | Admit: 2018-07-02 | Discharge: 2018-07-02 | Disposition: A | Discharge status: Home / Self Care | Payer: No Typology Code available for payment source | Source: Ambulatory Visit | Attending: Pulmonary Disease | Admitting: Pulmonary Disease

## 2018-07-02 DIAGNOSIS — J449 Chronic obstructive pulmonary disease, unspecified: Secondary | ICD-10-CM

## 2018-07-02 NOTE — Progress Notes (Signed)
Daily Session Note  Patient Details  Name: Steve Riley MRN: [id number] Date of Birth: 1950/06/11 Referring Provider:     Pulmonary Rehab Walk Test from 04/28/2018 in [place]  Referring Provider  Dr. Nelda Marseille      Encounter Date: 07/02/2018  Check In: Session Check In - 07/02/18 1444      Check-In   Supervising physician immediately available to respond to emergencies  [facility] immediately available    Physician(s)  Dr. Teryl Lucy    Location  [facility]    Staff Present  [name] RN, BSN;Dalton Kris Mouton, MS, Exercise Physiologist;Timothee Gali Leonia Reeves, RN, BSN    Medication changes reported      No    Fall or balance concerns reported     No    Tobacco Cessation  No Change    Warm-up and Cool-down  Performed as group-led instruction    Resistance Training Performed  Yes    VAD Patient?  No    PAD/SET Patient?  No      Pain Assessment   Currently in Pain?  No/denies    Pain Score  0-No pain    Multiple Pain Sites  No       Capillary Blood Glucose: No results found for this or any previous visit (from the past 24 hour(s)).    Social History   Tobacco Use  Smoking Status Former Smoker  . Packs/day: 3.00  . Years: 54.00  . Pack years: 162.00  . Types: Cigarettes  . Last attempt to quit: 07/11/2016  . Years since quitting: 1.9  Smokeless Tobacco Never Used    Goals Met:  Proper associated with RPD/PD & O2 Sat Exercise tolerated well Strength training completed today  Goals Unmet:  Not Applicable  Comments: Service time is from 1330 to 1500    Dr. Rush Farmer is Medical Director for Pulmonary Rehab at [facility].

## 2018-07-07 ENCOUNTER — Encounter (HOSPITAL_COMMUNITY)
Admission: RE | Admit: 2018-07-07 | Discharge: 2018-07-07 | Disposition: A | Discharge status: Home / Self Care | Payer: No Typology Code available for payment source | Source: Ambulatory Visit | Attending: Pulmonary Disease | Admitting: Pulmonary Disease

## 2018-07-07 DIAGNOSIS — J449 Chronic obstructive pulmonary disease, unspecified: Secondary | ICD-10-CM | POA: Diagnosis not present

## 2018-07-07 NOTE — Progress Notes (Signed)
Daily Session Note  Patient Details  Name: Steve Riley MRN: [id number] Date of Birth: 03/31/1951 Referring Provider:     Pulmonary Rehab Walk Test from 04/28/2018 in [facility]  Referring Provider  Dr. Yacoub      Encounter Date: 07/07/2018  Check In: Session Check In - 07/07/18 1517      Check-In   Supervising physician immediately available to respond to emergencies  [facility] immediately available    Physician(s)  [place]    Location  [facility]    Staff Present  Lindsay Agnew RN, BSN;Joan Behrens, RN, BSN;Molly DiVincenzo, MS, ACSM RCEP, Exercise Physiologist;Dalton Fletcher, MS, Exercise Physiologist    Medication changes reported      No    Fall or balance concerns reported     No    Tobacco Cessation  No Change    Warm-up and Cool-down  Performed as group-led instruction    Resistance Training Performed  Yes    VAD Patient?  No    PAD/SET Patient?  No      Pain Assessment   Currently in Pain?  No/denies    Pain Score  0-No pain    Multiple Pain Sites  No       Capillary Blood Glucose: No results found for this or any previous visit (from the past 24 hour(s)).    Social History   Tobacco Use  Smoking Status Former Smoker  . Packs/day: 3.00  . Years: 54.00  . Pack years: 162.00  . Types: Cigarettes  . Last attempt to quit: 07/11/2016  . Years since quitting: 1.9  Smokeless Tobacco Never Used    Goals Met:  Exercise tolerated well  Goals Unmet:  Not Applicable  Comments: Service time is from 1:30p to 3:00p    Dr. Wesam G. Yacoub is Medical Director for Pulmonary Rehab at  Hospital. 

## 2018-07-09 ENCOUNTER — Encounter (HOSPITAL_COMMUNITY)
Admission: RE | Admit: 2018-07-09 | Discharge: 2018-07-09 | Disposition: A | Discharge status: Home / Self Care | Payer: No Typology Code available for payment source | Source: Ambulatory Visit | Attending: Pulmonary Disease | Admitting: Pulmonary Disease

## 2018-07-09 DIAGNOSIS — J449 Chronic obstructive pulmonary disease, unspecified: Secondary | ICD-10-CM | POA: Diagnosis not present

## 2018-07-09 DIAGNOSIS — J441 Chronic obstructive pulmonary disease with (acute) exacerbation: Secondary | ICD-10-CM | POA: Diagnosis not present

## 2018-07-09 NOTE — Progress Notes (Signed)
Daily Session Note  Patient Details  Name: Steve Riley MRN: [id number] Date of Birth: 1950/09/10 Referring Provider:     Pulmonary Rehab Walk Test from 04/28/2018 in [place]  Referring Provider  Dr. Nelda Marseille      Encounter Date: 07/09/2018  Check In: Session Check In - 07/09/18 1342      Check-In   Supervising physician immediately available to respond to emergencies  [facility] immediately available    Physician(s)  Dr. Waldron Labs    Staff Present  [name] RN, BSN;Carlette Wilber Oliphant, RN, Bjorn Loser, MS, Exercise Physiologist;Lisa Ysidro Evert, RN;Molly DiVincenzo, MS, ACSM RCEP, Exercise Physiologist    Medication changes reported      No    Fall or balance concerns reported     No    Tobacco Cessation  No Change    Warm-up and Cool-down  Performed as group-led instruction    Resistance Training Performed  Yes    VAD Patient?  No    PAD/SET Patient?  No      Pain Assessment   Currently in Pain?  No/denies    Pain Score  0-No pain    Multiple Pain Sites  No       Capillary Blood Glucose: No results found for this or any previous visit (from the past 24 hour(s)).    Social History   Tobacco Use  Smoking Status Former Smoker  . Packs/day: 3.00  . Years: 54.00  . Pack years: 162.00  . Types: Cigarettes  . Last attempt to quit: 07/11/2016  . Years since quitting: 1.9  Smokeless Tobacco Never Used    Goals Met:  Proper associated with RPD/PD & O2 Sat Exercise tolerated well Strength training completed today  Goals Unmet:  Not Applicable  Comments: Service time is from 1330 to [place]    Dr. Rush Farmer is Medical Director for Pulmonary Rehab at [facility].

## 2018-07-10 DIAGNOSIS — J449 Chronic obstructive pulmonary disease, unspecified: Secondary | ICD-10-CM | POA: Diagnosis not present

## 2018-07-14 ENCOUNTER — Encounter (HOSPITAL_COMMUNITY)
Admission: RE | Admit: 2018-07-14 | Discharge: 2018-07-14 | Disposition: A | Discharge status: Home / Self Care | Payer: No Typology Code available for payment source | Source: Ambulatory Visit | Attending: Pulmonary Disease | Admitting: Pulmonary Disease

## 2018-07-14 VITALS — Wt 201.1 lb

## 2018-07-14 DIAGNOSIS — J449 Chronic obstructive pulmonary disease, unspecified: Secondary | ICD-10-CM | POA: Diagnosis not present

## 2018-07-14 DIAGNOSIS — Z79899 Other long term (current) drug therapy: Secondary | ICD-10-CM | POA: Diagnosis not present

## 2018-07-14 DIAGNOSIS — C61 Malignant neoplasm of prostate: Secondary | ICD-10-CM | POA: Diagnosis not present

## 2018-07-14 NOTE — Progress Notes (Signed)
Pulmonary Individual Treatment Plan  Patient Details  Name: ABDULLAH RIZZI MRN: [id number] Date of Birth: 07/14/1950 Referring Provider:     Pulmonary Rehab Walk Test from 04/28/2018 in [place]  Referring Provider  Dr. Nelda Marseille      Initial Encounter Date:    Pulmonary Rehab Walk Test from 04/28/2018 in [place]  Date  04/28/18      Visit Diagnosis: Chronic obstructive pulmonary disease, unspecified COPD type ([place])  Patient's Home Medications on Admission:   Current Outpatient Medications:  .  acetaminophen (TYLENOL) 325 MG tablet, Take 2 tablets (650 mg total) by mouth every 6 (six) hours as needed for mild pain (or Fever >/= 101)., Disp: , Rfl:  .  albuterol (PROVENTIL HFA;VENTOLIN HFA) 108 (90 Base) MCG/ACT inhaler, Inhale 2 puffs into the lungs every 6 (six) hours as needed for wheezing., Disp: , Rfl:  .  Ascorbic Acid (VITAMIN C WITH ROSE HIPS) 1000 MG tablet, Take 1,000 mg by mouth daily., Disp: , Rfl:  .  B Complex-C-Folic Acid (HM SUPER VITAMIN B COMPLEX/C) TABS, Take by mouth., Disp: , Rfl:  .  budesonide-formoterol (SYMBICORT) 80-4.5 MCG/ACT inhaler, Inhale 2 puffs into the lungs 2 (two) times daily., Disp: , Rfl:  .  Calcium Carbonate-Vitamin D (CALCIUM 600/VITAMIN D) 600-400 MG-UNIT chew tablet, Chew 1 tablet by mouth daily., Disp: , Rfl:  .  Flurandrenolide 4 MCG/SQCM TAPE, Apply topically., Disp: , Rfl:  .  guaiFENesin (MUCINEX) 600 MG 12 hr tablet, Take 1 tablet (600 mg total) by mouth 2 (two) times daily. (Patient not taking: Reported on 04/28/2018), Disp: 20 tablet, Rfl: 0 .  ipratropium-albuterol (DUONEB) 0.5-2.5 (3) MG/3ML SOLN, Take 3 mLs by nebulization every 6 (six) hours as needed (wheezing)., Disp: 360 mL, Rfl: 0 .  levofloxacin (LEVAQUIN) 500 MG tablet, Take 1 tablet (500 mg total) by mouth daily. (Patient not taking: Reported on 04/28/2018), Disp: 2 tablet, Rfl: 0 .  meloxicam (MOBIC) 15 MG  tablet, Take 15 mg by mouth daily., Disp: , Rfl:  .  Methylsulfonylmethane (MSM) 1000 MG CAPS, Take 1,000 mg by mouth 2 (two) times daily., Disp: , Rfl:  .  Multiple Vitamins-Minerals (CENTRUM SILVER) tablet, Take 1 tablet by mouth daily., Disp: , Rfl:  .  Omega-3 1000 MG CAPS, Take 1 g by mouth 2 (two) times daily., Disp: , Rfl:  .  pantoprazole (PROTONIX) 40 MG tablet, Take 1 tablet (40 mg total) by mouth daily. (Patient not taking: Reported on 04/28/2018), Disp: 30 tablet, Rfl: 0 .  predniSONE (STERAPRED UNI-PAK 21 TAB) 10 MG (21) TBPK tablet, Use per package instruction (Patient not taking: Reported on 04/28/2018), Disp: 21 tablet, Rfl: 0 .  Thiamine HCl (VITAMIN B-1) 250 MG tablet, Take 250 mg by mouth daily., Disp: , Rfl:  .  traZODone (DESYREL) 100 MG tablet, Take 100 mg by mouth at bedtime., Disp: , Rfl:  .  Turmeric Curcumin 500 MG CAPS, Take 500 mg by mouth 2 (two) times daily., Disp: , Rfl:   Past Medical History: Past Medical History:  Diagnosis Date  . COPD (chronic obstructive pulmonary disease) ([place])   . Prostate cancer ([place])     Tobacco Use: Social History   Tobacco Use  Smoking Status Former Smoker  . Packs/day: 3.00  . Years: 54.00  . Pack years: 162.00  . Types: Cigarettes  . Last attempt to quit: 07/11/2016  . Years since quitting: 2.0  Smokeless Tobacco Never Used  Labs: Recent Review Flowsheet Data    Labs for ITP Cardiac and Pulmonary Rehab Latest Ref Rng & Units 03/06/2018 03/07/2018   Hemoglobin A1c 4.8 - 5.6 % - 6.5(H)   HCO3 20.0 - 28.0 mmol/L 22.9 -   TCO2 22 - 32 mmol/L 24 -   ACIDBASEDEF 0.0 - 2.0 mmol/L 1.0 -   O2SAT % 81.0 -      Capillary Blood Glucose: Lab Results  Component Value Date   GLUCAP 86 03/09/2018   GLUCAP 104 (H) 03/09/2018   GLUCAP 139 (H) 03/08/2018   GLUCAP 225 (H) 03/07/2018     Pulmonary Assessment Scores: Pulmonary Assessment Scores    Row Name 04/30/18 0705         ADL UCSD   ADL Phase  Entry       mMRC  Score   mMRC Score  3        Pulmonary Function Assessment:   Exercise Target Goals: Exercise Program Goal: Individual exercise prescription set using results from initial 6 min walk test and THRR while considering  patient's activity barriers and safety.   Exercise Prescription Goal: Initial exercise prescription builds to 30-45 minutes a day of aerobic activity, 2-3 days per week.  Home exercise guidelines will be given to patient during program as part of exercise prescription that the participant will acknowledge.  Activity Barriers & Risk Stratification: Activity Barriers & Cardiac Risk Stratification - 04/24/18 1031      Activity Barriers & Cardiac Risk Stratification   Activity Barriers  Balance Concerns;Deconditioning;Assistive Device;Shortness of Breath;Muscular Weakness;Back Problems   right hip discomfort necrotic femur      6 Minute Walk: 6 Minute Walk    Row Name 04/30/18 0705         6 Minute Walk   Phase  Initial     Distance  500 feet     Walk Time  3.5 minutes     # of Rest Breaks  1     MPH  0.95     METS  1.77     RPE  11     Perceived Dyspnea   1     Symptoms  Yes (comment)     Comments  Pt was in the restroom for the last 2.5 minutes of test. I would have put O2 on him if not, as he desaturated down to 83% on RA. Pt reported low perceptual values although he was visiablly out of breath even 5 minutes post exercise.      Resting HR  70 bpm     Resting BP  124/82     Resting Oxygen Saturation   95 %     Exercise Oxygen Saturation  during 6 min walk  83 %     Max Ex. HR  94 bpm     Max Ex. BP  144/72     2 Minute Post BP  116/78       Interval HR   1 Minute HR  88     2 Minute HR  92     3 Minute HR  92     6 Minute HR  94     2 Minute Post HR  74     Interval Heart Rate?  Yes       Interval Oxygen   Interval Oxygen?  Yes     Baseline Oxygen Saturation %  95 %     1 Minute Oxygen Saturation %  93 %  1 Minute Liters of Oxygen  0 L      2 Minute Oxygen Saturation %  87 %     2 Minute Liters of Oxygen  0 L     3 Minute Oxygen Saturation %  83 %     3 Minute Liters of Oxygen  0 L     6 Minute Oxygen Saturation %  90 %     6 Minute Liters of Oxygen  0 L     2 Minute Post Oxygen Saturation %  96 %     2 Minute Post Liters of Oxygen  0 L        Oxygen Initial Assessment: Oxygen Initial Assessment - 07/13/18 0735      Home Oxygen   Home Oxygen Device  None   [facility].   Sleep Oxygen Prescription  None    Home Exercise Oxygen Prescription  Continuous    Liters per minute  2    Home at Rest Exercise Oxygen Prescription  None      Program Oxygen Prescription   Program Oxygen Prescription  E-Tanks    Liters per minute  2    Comments  During walk test on RA, pt desaturated down to 83%. I was going to put O2 on pt for the remainder of walk test, but pt went to the rest room for the rest of the test. Will start at 2L, but may have to increase.      Intervention   Short Term Goals  To learn and exhibit compliance with exercise, home and travel O2 prescription;To learn and understand importance of maintaining oxygen saturations>88%;To learn and demonstrate proper use of respiratory medications;To learn and understand importance of monitoring SPO2 with pulse oximeter and demonstrate accurate use of the pulse oximeter.;To learn and demonstrate proper pursed lip breathing techniques or other breathing techniques.    Long  Term Goals  Verbalizes importance of monitoring SPO2 with pulse oximeter and return demonstration;Exhibits proper breathing techniques, such as pursed lip breathing or other method taught during program session;Exhibits compliance with exercise, home and travel O2 prescription;Maintenance of O2 saturations>88%;Compliance with respiratory medication;Demonstrates proper use of MDI's       Oxygen Re-Evaluation: Oxygen Re-Evaluation    Row Name 05/20/18 0858 06/16/18 0843            Program Oxygen Prescription   Program Oxygen Prescription  E-Tanks  E-Tanks      Liters per minute  2  2        Home Oxygen   Home Oxygen Device  None  None [facility]      Sleep Oxygen Prescription  -  None      Home Exercise Oxygen Prescription  Continuous  Continuous      Liters per minute  2 Pt has not been supplied with home oxygen yet, so I will not advise on home exercise until home oxygen is obtained.   2      Home at Rest Exercise Oxygen Prescription  None  -        Goals/Expected Outcomes   Short Term Goals  To learn and exhibit compliance with exercise, home and travel O2 prescription;To learn and understand importance of maintaining oxygen saturations>88%;To learn and demonstrate proper use of respiratory medications;To learn and understand importance of monitoring SPO2 with pulse oximeter and demonstrate accurate use of the pulse oximeter.;To learn and  demonstrate proper pursed lip breathing techniques or other breathing techniques.  To learn and exhibit compliance with exercise, home and travel O2 prescription;To learn and understand importance of maintaining oxygen saturations>88%;To learn and demonstrate proper use of respiratory medications;To learn and understand importance of monitoring SPO2 with pulse oximeter and demonstrate accurate use of the pulse oximeter.;To learn and demonstrate proper pursed lip breathing techniques or other breathing techniques.      Long  Term Goals  -  Verbalizes importance of monitoring SPO2 with pulse oximeter and return demonstration;Exhibits proper breathing techniques, such as pursed lip breathing or other method taught during program session;Exhibits compliance with exercise, home and travel O2 prescription;Maintenance of O2 saturations>88%;Compliance with respiratory medication;Demonstrates proper use of MDI's      Comments  Setting up home oxygen. Expect him to have it soon.  Setting up home  oxygen. Expect him to have it soon.      Goals/Expected Outcomes  compliance with home oxygen when obtained.  compliance with home oxygen when obtained.         Oxygen Discharge (Final Oxygen Re-Evaluation): Oxygen Re-Evaluation - 06/16/18 0843      Program Oxygen Prescription   Program Oxygen Prescription  E-Tanks    Liters per minute  2      Home Oxygen   Home Oxygen Device  None   [facility] about [facility]   Sleep Oxygen Prescription  None    Home Exercise Oxygen Prescription  Continuous    Liters per minute  2      Goals/Expected Outcomes   Short Term Goals  To learn and exhibit compliance with exercise, home and travel O2 prescription;To learn and understand importance of maintaining oxygen saturations>88%;To learn and demonstrate proper use of respiratory medications;To learn and understand importance of monitoring SPO2 with pulse oximeter and demonstrate accurate use of the pulse oximeter.;To learn and demonstrate proper pursed lip breathing techniques or other breathing techniques.    Long  Term Goals  Verbalizes importance of monitoring SPO2 with pulse oximeter and return demonstration;Exhibits proper breathing techniques, such as pursed lip breathing or other method taught during program session;Exhibits compliance with exercise, home and travel O2 prescription;Maintenance of O2 saturations>88%;Compliance with respiratory medication;Demonstrates proper use of MDI's    Comments  Setting up home oxygen. Expect him to have it soon.    Goals/Expected Outcomes  compliance with home oxygen when obtained.       Initial Exercise Prescription: Initial Exercise Prescription - 04/30/18 0700      Date of Initial Exercise RX and Referring Provider   Date  04/28/18    Referring Provider  Dr. Nelda Marseille      Oxygen   Oxygen  Continuous    Liters  2      NuStep   Level  2    SPM  80    Minutes  17      Arm Ergometer   Level  2    Minutes  17      Track    Laps  8    Minutes  17      Prescription Details   Frequency (times per week)  2    Duration  Progress to 45 minutes of aerobic exercise without signs/symptoms of physical distress      Intensity   THRR 40-80% of Max Heartrate  60-121    Ratings of Perceived Exertion  11-13    Perceived Dyspnea  0-4  Progression   Progression  Continue to progress workloads to maintain intensity without signs/symptoms of physical distress.      Resistance Training   Training Prescription  Yes    Weight  blue bands    Reps  10-15       Perform Capillary Blood Glucose checks as needed.  Exercise Prescription Changes: Exercise Prescription Changes    Row Name 05/05/18 1500 05/19/18 1500 06/02/18 1200 06/16/18 1500 06/30/18 1600     Response to Exercise   Blood Pressure (Admit)  140/76  112/70  120/70  132/80  120/80   Blood Pressure (Exercise)  120/80  124/78  148/64  146/68  140/86   Blood Pressure (Exit)  118/70  122/78  138/70  102/72  130/70   Heart Rate (Admit)  88 bpm  73 bpm  80 bpm  90 bpm  99 bpm   Heart Rate (Exercise)  98 bpm  111 bpm  111 bpm  94 bpm  120 bpm   Heart Rate (Exit)  86 bpm  87 bpm  82 bpm  83 bpm  111 bpm   Oxygen Saturation (Admit)  92 %  96 %  95 %  96 %  93 %   Oxygen Saturation (Exercise)  97 %  95 %  92 %  94 %  93 %   Oxygen Saturation (Exit)  95 %  99 %  96 %  92 %  95 %   Rating of Perceived Exertion (Exercise)  _0 Perceived Dyspnea (Exercise)  _1 Duration  Progress to 45 minutes of aerobic exercise without signs/symptoms of physical distress  Progress to 45 minutes of aerobic exercise without signs/symptoms of physical distress  Progress to 45 minutes of aerobic exercise without signs/symptoms of physical distress  Progress to 45 minutes of aerobic exercise without signs/symptoms of physical distress  Progress to 45 minutes of aerobic exercise without signs/symptoms of physical distress   Intensity  Other (comment) 40-80% of  HRR  THRR unchanged  THRR unchanged  THRR unchanged  THRR unchanged     Progression   Progression  Continue to progress workloads to maintain intensity without signs/symptoms of physical distress.  Continue to progress workloads to maintain intensity without signs/symptoms of physical distress.  Continue to progress workloads to maintain intensity without signs/symptoms of physical distress.  Continue to progress workloads to maintain intensity without signs/symptoms of physical distress.  Continue to progress workloads to maintain intensity without signs/symptoms of physical distress.     Resistance Training   Training Prescription  Yes  Yes  Yes  Yes  Yes   Weight  blue bands  blue bands  blue bands  blue bands  blue bands   Reps  10-15  10-15  10-15  10-15  10-15   Time  10 Minutes  10 Minutes  10 Minutes  10 Minutes  10 Minutes     Oxygen   Oxygen  Continuous  Continuous  Continuous  Continuous  Continuous   Liters  _2 NuStep   Level  _3 43   SPM  80  80  80  80  80   Minutes  _4 34  17   METs  1.5  1.8  1.7  2  1.9     Arm Ergometer   Level  2  2.5  2.5  2.5  3   Minutes  _0 Track   Laps  6  -  -  -  -   Minutes  17  -  -  -  -   Row Name 07/14/18 1500             Response to Exercise   Blood Pressure (Admit)  128/72       Blood Pressure (Exercise)  126/66       Blood Pressure (Exit)  112/70       Heart Rate (Admit)  81 bpm       Heart Rate (Exercise)  91 bpm       Heart Rate (Exit)  95 bpm       Oxygen Saturation (Admit)  96 %       Oxygen Saturation (Exercise)  94 %       Oxygen Saturation (Exit)  93 %       Rating of Perceived Exertion (Exercise)  15       Perceived Dyspnea (Exercise)  3       Duration  Progress to 45 minutes of aerobic exercise without signs/symptoms of physical distress       Intensity  THRR unchanged         Progression   Progression  Continue to progress workloads to maintain intensity  without signs/symptoms of physical distress.         Resistance Training   Training Prescription  Yes       Weight  blue bands       Reps  10-15       Time  10 Minutes         Oxygen   Oxygen  Continuous       Liters  2         NuStep   Level  5       SPM  80       Minutes  34       METs  2.6         Arm Ergometer   Level  3       Minutes  17          Exercise Comments:   Exercise Goals and Review: Exercise Goals    Row Name 04/24/18 1029             Exercise Goals   Increase Physical Activity  Yes       Intervention  Provide advice, education, support and counseling about physical activity/exercise needs.;Develop an individualized exercise prescription for aerobic and resistive training based on initial evaluation findings, risk stratification, comorbidities and participant's personal goals.       Expected Outcomes  Short Term: Attend rehab on a regular basis to increase amount of physical activity.;Long Term: Exercising regularly at least 3-5 days a week.;Long Term: Add in home exercise to make exercise part of routine and to increase amount of physical activity.       Increase Strength and Stamina  Yes       Intervention  Provide advice, education, support and counseling about physical activity/exercise needs.;Develop an individualized exercise prescription for aerobic and resistive training based on initial evaluation findings, risk stratification, comorbidities and participant's personal goals.       Expected Outcomes  Short Term: Increase workloads from  initial exercise prescription for resistance, speed, and METs.;Long Term: Improve cardiorespiratory fitness, muscular endurance and strength as measured by increased METs and functional capacity (6MWT);Short Term: Perform resistance training exercises routinely during rehab and add in resistance training at home       Able to understand and use rate of perceived exertion (RPE) scale  Yes       Intervention  Provide  education and explanation on how to use RPE scale       Expected Outcomes  Short Term: Able to use RPE daily in rehab to express subjective intensity level;Long Term:  Able to use RPE to guide intensity level when exercising independently       Able to understand and use Dyspnea scale  Yes       Intervention  Provide education and explanation on how to use Dyspnea scale       Expected Outcomes  Short Term: Able to use Dyspnea scale daily in rehab to express subjective sense of shortness of breath during exertion;Long Term: Able to use Dyspnea scale to guide intensity level when exercising independently       Knowledge and understanding of Target Heart Rate Range (THRR)  Yes       Intervention  Provide education and explanation of THRR including how the numbers were predicted and where they are located for reference       Expected Outcomes  Short Term: Able to state/look up THRR;Long Term: Able to use THRR to govern intensity when exercising independently       Understanding of Exercise Prescription  Yes       Intervention  Provide education, explanation, and written materials on patient's individual exercise prescription       Expected Outcomes  Short Term: Able to explain program exercise prescription;Long Term: Able to explain home exercise prescription to exercise independently          Exercise Goals Re-Evaluation : Exercise Goals Re-Evaluation    Row Name 05/20/18 0900 06/16/18 0844 07/13/18 0731         Exercise Goal Re-Evaluation   Exercise Goals Review  Increase Physical Activity;Increase Strength and Stamina;Able to understand and use rate of perceived exertion (RPE) scale;Able to understand and use Dyspnea scale;Knowledge and understanding of Target Heart Rate Range (THRR);Understanding of Exercise Prescription  Increase Physical Activity;Increase Strength and Stamina;Able to understand and use rate of perceived exertion (RPE) scale;Able to understand and use Dyspnea scale;Knowledge  and understanding of Target Heart Rate Range (THRR);Understanding of Exercise Prescription  Increase Physical Activity;Increase Strength and Stamina;Able to understand and use rate of perceived exertion (RPE) scale;Able to understand and use Dyspnea scale;Knowledge and understanding of Target Heart Rate Range (THRR);Understanding of Exercise Prescription     Comments  Pt has completed 4 exercise sessions. Pt needs oxygen at home, so I will not advise on home exercise at this time. Pt has hip pain that at times he rates 10/10. Because of this I have removed him from walking the track, and his primary exercise comes from the arm ergometer. An appointment for his hip pain is in the works. Progress will be hindered until the hip pain is resolved. Will continue to progress and monitor as able.   Pt is highly motivated to exercise, but has just recently been informed that he will need a total hip replacement. Pt is accepting of workload increases. Pt exercises at low MET levels (2.2). Will continue to monitor and progress as able.   Patient is progressing well in  program despite his hip pain. Patient routinely rates pain 8-10/10. Patient is being evaluated for hip replacement. Patient should be getting his home oxygen this week from the [place]. Once he gets his home oxygen we will discuss home exercise. Patient is not able to walk at this point due to his immense hip pain. Patient is motivated to make changes and seems to enjoy exercise. We tried to do the recumbent bike as something different in his exercise prescription but he was unable due to hip pain. Will cont. to monitor and progress as able.      Expected Outcomes  Through exercise at rehab and at home, the patient will decrease shortness of breath with daily activities and feel confident in carrying out an exercise regime at home.   Through exercise at rehab and at home, the patient will decrease shortness of breath with daily activities and feel confident in  carrying out an exercise regime at home.   Through exercise at rehab, patient will increase physical capacity, decrease shortness of breath, and feel confident in implementing and exercise routine at home. Also, when patient completes their graduation 6MWT they will increase their walk test distance by atleast 100 feet.         Discharge Exercise Prescription (Final Exercise Prescription Changes): Exercise Prescription Changes - 07/14/18 1500      Response to Exercise   Blood Pressure (Admit)  128/72    Blood Pressure (Exercise)  126/66    Blood Pressure (Exit)  112/70    Heart Rate (Admit)  81 bpm    Heart Rate (Exercise)  91 bpm    Heart Rate (Exit)  95 bpm    Oxygen Saturation (Admit)  96 %    Oxygen Saturation (Exercise)  94 %    Oxygen Saturation (Exit)  93 %    Rating of Perceived Exertion (Exercise)  15    Perceived Dyspnea (Exercise)  3    Duration  Progress to 45 minutes of aerobic exercise without signs/symptoms of physical distress    Intensity  THRR unchanged      Progression   Progression  Continue to progress workloads to maintain intensity without signs/symptoms of physical distress.      Resistance Training   Training Prescription  Yes    Weight  blue bands    Reps  10-15    Time  10 Minutes      Oxygen   Oxygen  Continuous    Liters  2      NuStep   Level  5    SPM  80    Minutes  34    METs  2.6      Arm Ergometer   Level  3    Minutes  17       Nutrition:  Target Goals: Understanding of nutrition guidelines, daily intake of sodium <1571m, cholesterol <2070m calories 30% from fat and 7% or less from saturated fats, daily to have 5 or more servings of fruits and vegetables.  Biometrics:    Nutrition Therapy Plan and Nutrition Goals: Nutrition Therapy & Goals - 05/14/18 1525      Nutrition Therapy   Diet  diabetic      Personal Nutrition Goals   Nutrition Goal  pt able to name foods that affect blood glucose    Personal Goal #2  Pt to  identify and limit food sources of sodium, and refined carbohydrates    Personal Goal #3  Identify food quantities necessary to  achieve wt loss of  -2# per week to a goal wt loss of 6-24 lb at graduation from pulmonary rehab.    Personal Goal #4  Describe the benefit of including fruits, vegetables, whole grains, and low-fat dairy products in a healthy meal plan.      Intervention Plan   Intervention  Prescribe, educate and counsel regarding individualized specific dietary modifications aiming towards targeted core components such as weight, hypertension, lipid management, diabetes, heart failure and other comorbidities.    Expected Outcomes  Short Term Goal: Understand basic principles of dietary content, such as calories, fat, sodium, cholesterol and nutrients.;Long Term Goal: Adherence to prescribed nutrition plan.       Nutrition Assessments: Nutrition Assessments - 05/08/18 1254      Rate Your Plate Scores   Pre Score  41       Nutrition Goals Re-Evaluation:   Nutrition Goals Discharge (Final Nutrition Goals Re-Evaluation):   Psychosocial: Target Goals: Acknowledge presence or absence of significant depression and/or stress, maximize coping skills, provide positive support system. Participant is able to verbalize types and ability to use techniques and skills needed for reducing stress and depression.  Initial Review & Psychosocial Screening: Initial Psych Review & Screening - 04/24/18 1144      Initial Review   Current issues with  Current Stress Concerns    Source of Stress Concerns  Unable to perform yard/household activities;Family;Financial;Unable to participate in former interests or hobbies    Comments  Pt with multiple psychosocial issues.  Pt is tearful today which is not like him.  This causes him stress and strain that he appears "weak"      Family Dynamics   Comments  ex wife and her husband are supportive of Romeo.  Son helps with getting groceries into the  house and put away.  Pt is estranged from his daughter       Quality of Life Scores:  Scores of 19 and below usually indicate a poorer quality of life in these areas.  A difference of  2-3 points is a clinically meaningful difference.  A difference of 2-3 points in the total score of the Quality of Life Index has been associated with significant improvement in overall quality of life, self-image, physical symptoms, and general health in studies assessing change in quality of life.  PHQ-9: Recent Review Flowsheet Data    Depression screen [facility] 2/9 04/24/2018 04/24/2018 07/02/2017 03/13/2017   Decreased Interest 0 1 0 0   Down, Depressed, Hopeless 0 1 0 0   PHQ - 2 Score 0 2 0 0   Altered sleeping 0 0 - -   Tired, decreased energy 3 3 - -   Change in appetite 0 0 - -   Feeling bad or failure about yourself  2 1 - -   Trouble concentrating 1 1 - -   Moving slowly or fidgety/restless 0 0 - -   Suicidal thoughts 0 0 - -   PHQ-9 Score 6 7 - -   Difficult doing work/chores - Very difficult - -     Interpretation of Total Score  Total Score Depression Severity:  1-4 = Minimal depression, 5-9 = Mild depression, 10-14 = Moderate depression, 15-19 = Moderately severe depression, 20-27 = Severe depression   Psychosocial Evaluation and Intervention: Psychosocial Evaluation - 04/24/18 1146      Psychosocial Evaluation & Interventions   Interventions  Stress management education;Relaxation education;Encouraged to exercise with the program and follow exercise prescription  Comments  pt with multiple issues that he is dealing with such as intrafamilial strain,financial stress and feelings of low self worth.    Expected Outcomes  Pt will report and employ positive and healthy techniques for stress management.    Continue Psychosocial Services   Follow up required by staff       Psychosocial Re-Evaluation: Psychosocial Re-Evaluation    [place] Name 05/19/18 [phone] 06/16/18 1631 07/14/18 1631          Psychosocial Re-Evaluation   Current issues with  Current Stress Concerns  Current Stress Concerns  Current Stress Concerns     Comments  Seems to be enjoying the program, he seems to be handling his stress well  Is being worked up for a hip replacement, he is in quite a bit of pain upon standing, [facility].  [facility] to have a hip replacement     Expected Outcomes  continue to deal with his stress in appropriate ways  assist patient in preparing for possible hip replacement  assist patient in preparing for possible hip replacement     Interventions  -  Encouraged to attend Pulmonary Rehabilitation for the exercise;Relaxation education;Stress management education  Encouraged to attend Pulmonary Rehabilitation for the exercise;Relaxation education;Stress management education     Continue Psychosocial Services   Follow up required by staff  Follow up required by staff  Follow up required by staff     Comments  has attended 3 exercise sessions, is dealing with severe hip pain and has been encouraged to see PCP for evaluation  Is enjoying the program, despite hip pain he rarely misses a class  Cannot participate in activities he enjoys due to his hip pain       Initial Review   Source of Stress Concerns  Chronic Illness;Family;Unable to participate in former interests or hobbies  Chronic Illness;Family;Unable to participate in former interests or hobbies  Chronic Illness;Family;Unable to participate in former interests or hobbies        Psychosocial Discharge (Final Psychosocial Re-Evaluation): Psychosocial Re-Evaluation - 07/14/18 1631      Psychosocial Re-Evaluation   Current issues with  Current Stress Concerns    Comments  [facility] to have a [facility] replacement    Expected Outcomes  assist patient in preparing for possible hip replacement    Interventions  Encouraged to  attend Pulmonary Rehabilitation for the exercise;Relaxation education;Stress management education    Continue Psychosocial Services   Follow up required by staff    Comments  Cannot participate in activities he enjoys due to his hip pain      Initial Review   Source of Stress Concerns  Chronic Illness;Family;Unable to participate in former interests or hobbies       Education: Education Goals: Education classes will be provided on a weekly basis, covering required topics. Participant will state understanding/return demonstration of topics presented.  Learning Barriers/Preferences: Learning Barriers/Preferences - 04/24/18 1035      Learning Barriers/Preferences   Learning Barriers  Sight;Hearing    Learning Preferences  Written Material;Individual Instruction       Education Topics: Risk Factor Reduction:  -Group instruction that is supported by a PowerPoint presentation. Instructor discusses the definition of a risk factor, different risk factors for pulmonary disease, and how the heart and lungs work together.     Nutrition for Pulmonary Patient:  -Group  instruction provided by PowerPoint slides, verbal discussion, and written materials to support subject matter. The instructor gives an explanation and review of healthy diet recommendations, which includes a discussion on weight management, recommendations for fruit and vegetable consumption, as well as protein, fluid, caffeine, fiber, sodium, sugar, and alcohol. Tips for eating when patients are short of breath are discussed.   PULMONARY REHAB CHRONIC OBSTRUCTIVE PULMONARY DISEASE from 07/09/2018 in [place]  Date  06/18/18  Educator  Rodman Pickle  Instruction Review Code  1- Verbalizes Understanding      Pursed Lip Breathing:  -Group instruction that is supported by demonstration and informational handouts. Instructor discusses the benefits of pursed lip and diaphragmatic breathing and detailed  demonstration on how to preform both.     Oxygen Safety:  -Group instruction provided by PowerPoint, verbal discussion, and written material to support subject matter. There is an overview of "What is Oxygen" and "Why do we need it".  Instructor also reviews how to create a safe environment for oxygen use, the importance of using oxygen as prescribed, and the risks of noncompliance. There is a brief discussion on traveling with oxygen and resources the patient may utilize.   PULMONARY REHAB CHRONIC OBSTRUCTIVE PULMONARY DISEASE from 07/09/2018 in [place]  Date  05/28/18  Educator  Remo Lipps  Instruction Review Code  1- Verbalizes Understanding      Oxygen Equipment:  -Group instruction provided by [employer] utilizing handouts, written materials, and Insurance underwriter.   Signs and Symptoms:  -Group instruction provided by written material and verbal discussion to support subject matter. Warning signs and symptoms of infection, stroke, and heart attack are reviewed and when to call the physician/911 reinforced. Tips for preventing the spread of infection discussed.   PULMONARY REHAB CHRONIC OBSTRUCTIVE PULMONARY DISEASE from 07/09/2018 in [place]  Date  06/11/18  Educator   Remo Lipps  Instruction Review Code  1- Verbalizes Understanding      Advanced Directives:  -Group instruction provided by verbal instruction and written material to support subject matter. Instructor reviews Advanced Directive laws and proper instruction for filling out document.   Pulmonary Video:  -Group video education that reviews the importance of medication and oxygen compliance, exercise, good nutrition, pulmonary hygiene, and pursed lip and diaphragmatic breathing for the pulmonary patient.   Exercise for the Pulmonary Patient:  -Group instruction that is supported by a PowerPoint presentation. Instructor discusses benefits of  exercise, core components of exercise, frequency, duration, and intensity of an exercise routine, importance of utilizing pulse oximetry during exercise, safety while exercising, and options of places to exercise outside of rehab.     Pulmonary Medications:  -Verbally interactive group education provided by instructor with focus on inhaled medications and proper administration.   PULMONARY REHAB CHRONIC OBSTRUCTIVE PULMONARY DISEASE from 07/09/2018 in [place]  Date  06/30/18  Educator  Anderson Malta  Instruction Review Code  1- Verbalizes Understanding      Anatomy and Physiology of the Respiratory System and Intimacy:  -Group instruction provided by PowerPoint, verbal discussion, and written material to support subject matter. Instructor reviews respiratory cycle and anatomical components of the respiratory system and their functions. Instructor also reviews differences in obstructive and restrictive respiratory diseases with examples of each. Intimacy, Sex, and Sexuality differences are reviewed with a discussion on how relationships can change when diagnosed with pulmonary disease. Common sexual concerns are reviewed.   PULMONARY  REHAB CHRONIC OBSTRUCTIVE PULMONARY DISEASE from 07/09/2018 in [place]  Date  07/09/18  Educator  RN  Instruction Review Code  1- Verbalizes Understanding      MD DAY -A group question and answer session with a medical doctor that allows participants to ask questions that relate to their pulmonary disease state.   OTHER EDUCATION -Group or individual verbal, written, or video instructions that support the educational goals of the pulmonary rehab program.   PULMONARY REHAB CHRONIC OBSTRUCTIVE PULMONARY DISEASE from 07/09/2018 in [place]  Date  06/25/18 Patrice Paradise to hand weights]  Educator  Dalton  Instruction Review Code  1- [employer] Survival Tips:  -Group instruction provided by [employer], verbal discussion, and written materials to support subject matter. The instructor gives patients tips, tricks, and techniques to help them not only survive but enjoy the holidays despite the onslaught of food that accompanies the holidays.   Knowledge Questionnaire Score:   Core Components/Risk Factors/Patient Goals at Admission: Personal Goals and Risk Factors at Admission - 04/24/18 1036      Core Components/Risk Factors/Patient Goals on Admission    Weight Management  Yes    Intervention  Obesity: Provide education and appropriate resources to help participant work on and attain dietary goals.;Weight Management/Obesity: Establish reasonable short term and long term weight goals.;Weight Management: Develop a combined nutrition and exercise program designed to reach desired caloric intake, while maintaining appropriate intake of nutrient and fiber, sodium and fats, and appropriate energy expenditure required for the weight goal.    Admit Weight  200 lb 11.2 oz (91 kg)    Goal Weight: Short Term  190 lb (86.2 kg)    Goal Weight: Long Term  175 lb (79.4 kg)    Expected Outcomes  Short Term: Continue to assess and modify interventions until short term weight is achieved;Long Term: Adherence to nutrition and physical activity/exercise program aimed toward attainment of established weight goal;Understanding of distribution of calorie intake throughout the day with the consumption of 4-5 meals/snacks;Understanding recommendations for meals to include 15-35% energy as protein, 25-35% energy from fat, 35-60% energy from carbohydrates, less than 268m of dietary cholesterol, 20-35 gm of total fiber daily;Weight Loss: Understanding of general recommendations for a balanced deficit meal plan, which promotes 1-2 lb weight loss per week and includes a negative energy balance of [phone] kcal/d    Improve shortness of breath with ADL's   Yes    Intervention  Provide education, individualized exercise plan and daily activity instruction to help decrease symptoms of SOB with activities of daily living.    Expected Outcomes  Short Term: Improve cardiorespiratory fitness to achieve a reduction of symptoms when performing ADLs;Long Term: Be able to perform more ADLs without symptoms or delay the onset of symptoms    Stress  Yes    Intervention  Offer individual and/or small group education and counseling on adjustment to heart disease, stress management and health-related lifestyle change. Teach and support self-help strategies.    Expected Outcomes  Short Term: Participant demonstrates changes in health-related behavior, relaxation and other stress management skills, ability to obtain effective social support, and compliance with psychotropic medications if prescribed.;Long Term: Emotional wellbeing is indicated by absence of clinically significant psychosocial distress or social isolation.       Core Components/Risk Factors/Patient Goals Review:  Goals and Risk Factor Review    Row Name 05/19/18 [id number]/07/20 1634  07/14/18 1633         Core Components/Risk Factors/Patient Goals Review   Personal Goals Review  Weight Management/Obesity;Improve shortness of breath with ADL's;Increase knowledge of respiratory medications and ability to use respiratory devices properly.;Develop more efficient breathing techniques such as purse lipped breathing and diaphragmatic breathing and practicing self-pacing with activity.  Weight Management/Obesity;Improve shortness of breath with ADL's;Increase knowledge of respiratory medications and ability to use respiratory devices properly.;Develop more efficient breathing techniques such as purse lipped breathing and diaphragmatic breathing and practicing self-pacing with activity.  Weight Management/Obesity;Improve shortness of breath with ADL's;Increase knowledge of respiratory medications and ability to use  respiratory devices properly.;Develop more efficient breathing techniques such as purse lipped breathing and diaphragmatic breathing and practicing self-pacing with activity.     Review  Has attended 3 exercise sessions, too early to have met goals, have contacted the [place] multiple times to set up home oxygen for him to use when he exercises, also dealing with severe hip pain and encouraged to see his PCP for evaluation.  Is limited in what he can do d/t hip pain.  Is being referred to an orthopedic surgeon, has not lost any weight, seems happy to be in the program, enjoys the interaction with piers  Has lost 2 kg, his shortness of breath has improved, but he is very limited physically due to his hip pain and cannot progress on his workloads     Expected Outcomes  see admission core components  see admission core components  see admission core components        Core Components/Risk Factors/Patient Goals at Discharge (Final Review):  Goals and Risk Factor Review - 07/14/18 1633      Core Components/Risk Factors/Patient Goals Review   Personal Goals Review  Weight Management/Obesity;Improve shortness of breath with ADL's;Increase knowledge of respiratory medications and ability to use respiratory devices properly.;Develop more efficient breathing techniques such as purse lipped breathing and diaphragmatic breathing and practicing self-pacing with activity.    Review  Has lost 2 kg, his shortness of breath has improved, but he is very limited physically due to his hip pain and cannot progress on his workloads    Expected Outcomes  see admission core components       ITP Comments: ITP Comments    Row Name 04/24/18 1012           ITP Comments  Dr. Manfred Arch Medical Director Pulmonary Rehab          Comments: ITP REVIEW Pt is making expected progress toward pulmonary rehab goals after completing 19 sessions. Recommend continued exercise, life style modification, education, and utilization of  breathing techniques to increase stamina and strength and decrease shortness of breath with exertion.

## 2018-07-14 NOTE — Progress Notes (Signed)
Daily Session Note  Patient Details  Name: Steve Riley MRN: [id number] Date of Birth: 05/03/1951 Referring Provider:     Pulmonary Rehab Walk Test from 04/28/2018 in [place]  Referring Provider  Dr. Nelda Marseille      Encounter Date: 07/14/2018  Check In: Session Check In - 07/14/18 1330      Check-In   Supervising physician immediately available to respond to emergencies  [facility] immediately available    Physician(s)  Dr. Tawanna Solo    Location  [facility]    Staff Present  [name] RN, BSN;Dalton Kris Mouton, MS, Exercise Physiologist;Lisa Ysidro Evert, RN;Brodric Schauer Leonia Reeves, RN, BSN    Medication changes reported      No    Fall or balance concerns reported     No    Tobacco Cessation  No Change    Warm-up and Cool-down  Performed as group-led instruction    Resistance Training Performed  Yes    VAD Patient?  No    PAD/SET Patient?  No      Pain Assessment   Currently in Pain?  Yes    Pain Score  8     Pain Location  Hip    Pain Orientation  Right    Pain Descriptors / Indicators  Stabbing;Jabbing;Radiating;Sharp;Shooting    Pain Type  --   is awaiting hip replacement   Aggravating Factors   walking    Pain Relieving Factors  sitting and pain meds    Effect of Pain on Daily Activities  limits activities    Multiple Pain Sites  No       Capillary Blood Glucose: No results found for this or any previous visit (from the past 24 hour(s)).  Exercise Prescription Changes - 07/14/18 1500      Response to Exercise   Blood Pressure (Admit)  128/72    Blood Pressure (Exercise)  126/66    Blood Pressure (Exit)  112/70    Heart Rate (Admit)  81 bpm    Heart Rate (Exercise)  91 bpm    Heart Rate (Exit)  95 bpm    Oxygen Saturation (Admit)  96 %    Oxygen Saturation (Exercise)  94 %    Oxygen Saturation (Exit)  93 %    Rating of Perceived Exertion (Exercise)  15    Perceived Dyspnea (Exercise)  3    Duration  Progress to 45  minutes of aerobic exercise without signs/symptoms of physical distress    Intensity  THRR unchanged      Progression   Progression  Continue to progress workloads to maintain intensity without signs/symptoms of physical distress.      Resistance Training   Training Prescription  Yes    Weight  blue bands    Reps  10-15    Time  10 Minutes      Oxygen   Oxygen  Continuous    Liters  2      NuStep   Level  5    SPM  80    Minutes  34    METs  2.6      Arm Ergometer   Level  3    Minutes  17       Social History   Tobacco Use  Smoking Status Former Smoker  . Packs/day: 3.00  . Years: 54.00  . Pack years: 162.00  . Types: Cigarettes  . Last attempt to quit: 07/11/2016  . Years since quitting:  2.0  Smokeless Tobacco Never Used    Goals Met:  Proper associated with RPD/PD & O2 Sat Exercise tolerated well Strength training completed today  Goals Unmet:  Not Applicable  Comments: Service time is from 1330 to 1500    Dr. Rush Farmer is Medical Director for Pulmonary Rehab at [facility].

## 2018-07-16 ENCOUNTER — Encounter (HOSPITAL_COMMUNITY)
Admission: RE | Admit: 2018-07-16 | Discharge: 2018-07-16 | Disposition: A | Discharge status: Home / Self Care | Payer: No Typology Code available for payment source | Source: Ambulatory Visit | Attending: Pulmonary Disease | Admitting: Pulmonary Disease

## 2018-07-16 DIAGNOSIS — J449 Chronic obstructive pulmonary disease, unspecified: Secondary | ICD-10-CM | POA: Diagnosis not present

## 2018-07-16 NOTE — Progress Notes (Signed)
Daily Session Note  Patient Details  Name: Steve Riley MRN: [id number] Date of Birth: December 15, 1950 Referring Provider:     Pulmonary Rehab Walk Test from 04/28/2018 in [place]  Referring Provider  Dr. Nelda Marseille      Encounter Date: 07/16/2018  Check In: Session Check In - 07/16/18 1352      Check-In   Supervising physician immediately available to respond to emergencies  [facility] immediately available    Physician(s)  Dr. Broadus John    Location  [facility]    Staff Present  [name] RN, BSN;Molly DiVincenzo, MS, ACSM RCEP, Exercise Physiologist;Dalton Kris Mouton, MS, Exercise Physiologist;Demisha Nokes Ysidro Evert, RN    Medication changes reported      No    Fall or balance concerns reported     No    Tobacco Cessation  No Change    Warm-up and Cool-down  Performed as group-led instruction    Resistance Training Performed  Yes    VAD Patient?  No    PAD/SET Patient?  No      Pain Assessment   Currently in Pain?  No/denies    Pain Score  0-No pain    Multiple Pain Sites  No       Capillary Blood Glucose: No results found for this or any previous visit (from the past 24 hour(s)).    Social History   Tobacco Use  Smoking Status Former Smoker  . Packs/day: 3.00  . Years: 54.00  . Pack years: 162.00  . Types: Cigarettes  . Last attempt to quit: 07/11/2016  . Years since quitting: 2.0  Smokeless Tobacco Never Used    Goals Met:  Exercise tolerated well No report of cardiac concerns or symptoms Strength training completed today  Goals Unmet:  Not Applicable  Comments: Service time is from 1330 to 1515    Dr. Rush Farmer is Medical Director for Pulmonary Rehab at [facility].

## 2018-07-21 ENCOUNTER — Telehealth (HOSPITAL_COMMUNITY): Payer: Self-pay

## 2018-07-23 ENCOUNTER — Encounter (HOSPITAL_COMMUNITY)
Admission: RE | Admit: 2018-07-23 | Discharge: 2018-07-23 | Disposition: A | Discharge status: Home / Self Care | Payer: No Typology Code available for payment source | Source: Ambulatory Visit | Attending: Pulmonary Disease | Admitting: Pulmonary Disease

## 2018-07-23 DIAGNOSIS — J449 Chronic obstructive pulmonary disease, unspecified: Secondary | ICD-10-CM

## 2018-07-23 NOTE — Progress Notes (Signed)
Daily Session Note  Patient Details  Name: Steve Riley MRN: [id number] Date of Birth: 12-11-1950 Referring Provider:     Pulmonary Rehab Walk Test from 04/28/2018 in [place]  Referring Provider  Dr. Nelda Marseille      Encounter Date: 07/23/2018  Check In: Session Check In - 07/23/18 1416      Check-In   Supervising physician immediately available to respond to emergencies  [facility] immediately available    Physician(s)  Dr. Berle Mull    Location  [facility]    Staff Present  Su Hilt, MS, ACSM RCEP, Exercise Physiologist;Dalton Kris Mouton, MS, Exercise Physiologist;Lisa Ysidro Evert, RN;Faten Frieson Leonia Reeves, RN, BSN    Medication changes reported      No    Fall or balance concerns reported     No    Tobacco Cessation  No Change    Warm-up and Cool-down  Performed as group-led instruction    Resistance Training Performed  Yes    VAD Patient?  No    PAD/SET Patient?  No      Pain Assessment   Currently in Pain?  No/denies    Multiple Pain Sites  No       Capillary Blood Glucose: No results found for this or any previous visit (from the past 24 hour(s)).    Social History   Tobacco Use  Smoking Status Former Smoker  . Packs/day: 3.00  . Years: 54.00  . Pack years: 162.00  . Types: Cigarettes  . Last attempt to quit: 07/11/2016  . Years since quitting: 2.0  Smokeless Tobacco Never Used    Goals Met:  Proper associated with RPD/PD & O2 Sat Exercise tolerated well Strength training completed today  Goals Unmet:  Not Applicable  Comments: Service time is from 1330 to 1525    Dr. Rush Farmer is Medical Director for Pulmonary Rehab at [facility]

## 2018-07-28 ENCOUNTER — Encounter (HOSPITAL_COMMUNITY)
Admission: RE | Admit: 2018-07-28 | Discharge: 2018-07-28 | Disposition: A | Discharge status: Home / Self Care | Payer: No Typology Code available for payment source | Source: Ambulatory Visit | Attending: Pulmonary Disease | Admitting: Pulmonary Disease

## 2018-07-28 VITALS — Wt 196.9 lb

## 2018-07-28 DIAGNOSIS — J449 Chronic obstructive pulmonary disease, unspecified: Secondary | ICD-10-CM | POA: Diagnosis not present

## 2018-07-28 NOTE — Progress Notes (Signed)
Daily Session Note  Patient Details  Name: Steve Riley MRN: [id number] Date of Birth: 11-19-1950 Referring Provider:     Pulmonary Rehab Walk Test from 04/28/2018 in [place]  Referring Provider  Dr. Nelda Marseille      Encounter Date: 07/28/2018  Check In: Session Check In - 07/28/18 1415      Check-In   Supervising physician immediately available to respond to emergencies  [facility] immediately available    Physician(s)  Dr. Berle Mull    Location  [facility]    Staff Present  Rosebud Poles, RN, BSN;Carlette Carlton, RN, BSN;Molly DiVincenzo, MS, ACSM RCEP, Exercise Physiologist;Dalton Kris Mouton, MS, Exercise Physiologist;Lisa Ysidro Evert, RN    Medication changes reported      No    Fall or balance concerns reported     No    Tobacco Cessation  No Change    Warm-up and Cool-down  Performed as group-led instruction    Resistance Training Performed  Yes    VAD Patient?  No    PAD/SET Patient?  No      Pain Assessment   Currently in Pain?  No/denies    Pain Score  0-No pain    Multiple Pain Sites  No       Capillary Blood Glucose: No results found for this or any previous visit (from the past 24 hour(s)).  Exercise Prescription Changes - 07/28/18 1600      Response to Exercise   Blood Pressure (Admit)  124/66    Blood Pressure (Exercise)  132/74    Blood Pressure (Exit)  120/76    Heart Rate (Admit)  95 bpm    Heart Rate (Exercise)  101 bpm    Heart Rate (Exit)  97 bpm    Oxygen Saturation (Admit)  92 %    Oxygen Saturation (Exercise)  95 %    Oxygen Saturation (Exit)  94 %    Rating of Perceived Exertion (Exercise)  14    Perceived Dyspnea (Exercise)  4    Duration  Progress to 45 minutes of aerobic exercise without signs/symptoms of physical distress    Intensity  THRR unchanged      Progression   Progression  Continue to progress workloads to maintain intensity without signs/symptoms of physical distress.       Resistance Training   Training Prescription  Yes    Weight  blue bands    Reps  10-15    Time  10 Minutes      Oxygen   Oxygen  Continuous    Liters  2      NuStep   Level  5    SPM  80    Minutes  34    METs  2.6      Arm Ergometer   Level  3    Minutes  17       Social History   Tobacco Use  Smoking Status Former Smoker  . Packs/day: 3.00  . Years: 54.00  . Pack years: 162.00  . Types: Cigarettes  . Last attempt to quit: 07/11/2016  . Years since quitting: 2.0  Smokeless Tobacco Never Used    Goals Met:  Proper associated with RPD/PD & O2 Sat Exercise tolerated well Strength training completed today  Goals Unmet:  Not Applicable  Comments: Service time is from 1330 to 1500    Dr. Rush Farmer is Medical Director for Pulmonary Rehab at [facility].

## 2018-07-30 ENCOUNTER — Encounter (HOSPITAL_COMMUNITY)
Admission: RE | Admit: 2018-07-30 | Discharge: 2018-07-30 | Disposition: A | Discharge status: Home / Self Care | Payer: No Typology Code available for payment source | Source: Ambulatory Visit | Attending: Pulmonary Disease | Admitting: Pulmonary Disease

## 2018-07-30 DIAGNOSIS — J449 Chronic obstructive pulmonary disease, unspecified: Secondary | ICD-10-CM | POA: Diagnosis not present

## 2018-07-30 NOTE — Progress Notes (Signed)
Daily Session Note  Patient Details  Name: Steve Riley MRN: [id number] Date of Birth: 1951-03-31 Referring Provider:     Pulmonary Rehab Walk Test from 04/28/2018 in [place]  Referring Provider  Dr. Nelda Marseille      Encounter Date: 07/30/2018  Check In: Session Check In - 07/30/18 1343      Check-In   Supervising physician immediately available to respond to emergencies  [facility] immediately available    Physician(s)  Dr. Herbert Moors    Location  [facility]    Staff Present  [name] RN, BSN;Addisen Chappelle Leonia Reeves, RN, BSN;Molly DiVincenzo, MS, ACSM RCEP, Exercise Physiologist;Dalton Kris Mouton, MS, Exercise Physiologist    Medication changes reported      No    Fall or balance concerns reported     No    Tobacco Cessation  No Change    Warm-up and Cool-down  Performed as group-led instruction    Resistance Training Performed  Yes    VAD Patient?  No    PAD/SET Patient?  No      Pain Assessment   Currently in Pain?  No/denies    Pain Score  0-No pain    Multiple Pain Sites  No       Capillary Blood Glucose: No results found for this or any previous visit (from the past 24 hour(s)).    Social History   Tobacco Use  Smoking Status Former Smoker  . Packs/day: 3.00  . Years: 54.00  . Pack years: 162.00  . Types: Cigarettes  . Last attempt to quit: 07/11/2016  . Years since quitting: 2.0  Smokeless Tobacco Never Used    Goals Met:  Proper associated with RPD/PD & O2 Sat Exercise tolerated well Strength training completed today  Goals Unmet:  Not Applicable  Comments: Service time is from 1330 to 1500    Dr. Rush Farmer is Medical Director for Pulmonary Rehab at [facility].

## 2018-07-30 NOTE — Progress Notes (Signed)
I have reviewed a Home Exercise Prescription with Steve Riley . Steve Riley is not currently exercising at home.  The patient was advised to walk 2 days a week for 30 minutes post surgery.  Steve Riley and I discussed how to progress their exercise prescription.  The patient stated that their goals were to be less short of breath and walk without pain.  The patient stated that they understand the exercise prescription.  We reviewed exercise guidelines, target heart rate during exercise, RPE Scale, weather conditions, NTG use, endpoints for exercise, warmup and cool down.  Patient is encouraged to come to me with any questions. I will continue to follow up with the patient to assist them with progression and safety.

## 2018-08-04 ENCOUNTER — Encounter (HOSPITAL_COMMUNITY): Payer: No Typology Code available for payment source

## 2018-08-06 ENCOUNTER — Encounter (HOSPITAL_COMMUNITY)
Admission: RE | Admit: 2018-08-06 | Discharge: 2018-08-06 | Disposition: A | Discharge status: Home / Self Care | Payer: No Typology Code available for payment source | Source: Ambulatory Visit | Attending: Pulmonary Disease | Admitting: Pulmonary Disease

## 2018-08-06 DIAGNOSIS — J449 Chronic obstructive pulmonary disease, unspecified: Secondary | ICD-10-CM | POA: Diagnosis not present

## 2018-08-06 NOTE — Progress Notes (Signed)
Daily Session Note  Patient Details  Name: Steve Riley MRN: [id number] Date of Birth: June 09, 1951 Referring Provider:     Pulmonary Rehab Walk Test from 04/28/2018 in [place]  Referring Provider  Dr. Nelda Marseille      Encounter Date: 08/06/2018  Check In: Session Check In - 08/06/18 1346      Check-In   Supervising physician immediately available to respond to emergencies  [facility] immediately available    Physician(s)  Dr. Verlon Au    Location  [facility]    Staff Present  Rosebud Poles, RN, BSN;Molly DiVincenzo, MS, ACSM RCEP, Exercise Physiologist;Dalton Kris Mouton, MS, Exercise Physiologist;Lisa Ysidro Evert, RN    Medication changes reported      No    Fall or balance concerns reported     No    Tobacco Cessation  No Change    Warm-up and Cool-down  Performed as group-led instruction    Resistance Training Performed  Yes    VAD Patient?  No    PAD/SET Patient?  No      Pain Assessment   Currently in Pain?  No/denies    Multiple Pain Sites  No       Capillary Blood Glucose: No results found for this or any previous visit (from the past 24 hour(s)).    Social History   Tobacco Use  Smoking Status Former Smoker  . Packs/day: 3.00  . Years: 54.00  . Pack years: 162.00  . Types: Cigarettes  . Last attempt to quit: 07/11/2016  . Years since quitting: 2.0  Smokeless Tobacco Never Used    Goals Met:  Proper associated with RPD/PD & O2 Sat Exercise tolerated well Strength training completed today  Goals Unmet:  Not Applicable  Comments: Service time is from 1330 to 1510   Dr. Rush Farmer is Medical Director for Pulmonary Rehab at [facility].

## 2018-08-08 DIAGNOSIS — J441 Chronic obstructive pulmonary disease with (acute) exacerbation: Secondary | ICD-10-CM | POA: Diagnosis not present

## 2018-08-11 ENCOUNTER — Encounter (HOSPITAL_COMMUNITY)
Admission: RE | Admit: 2018-08-11 | Discharge: 2018-08-11 | Disposition: A | Discharge status: Home / Self Care | Payer: No Typology Code available for payment source | Source: Ambulatory Visit | Attending: Pulmonary Disease | Admitting: Pulmonary Disease

## 2018-08-11 VITALS — Wt 197.3 lb

## 2018-08-11 DIAGNOSIS — J449 Chronic obstructive pulmonary disease, unspecified: Secondary | ICD-10-CM | POA: Insufficient documentation

## 2018-08-11 DIAGNOSIS — C61 Malignant neoplasm of prostate: Secondary | ICD-10-CM | POA: Diagnosis not present

## 2018-08-11 DIAGNOSIS — Z79899 Other long term (current) drug therapy: Secondary | ICD-10-CM | POA: Insufficient documentation

## 2018-08-11 NOTE — Progress Notes (Signed)
Daily Session Note  Patient Details  Name: Steve Riley MRN: [id number] Date of Birth: 06-30-1950 Referring Provider:     Pulmonary Rehab Walk Test from 04/28/2018 in [place]  Referring Provider  Dr. Nelda Marseille      Encounter Date: 08/11/2018  Check In: Session Check In - 08/11/18 1356      Check-In   Supervising physician immediately available to respond to emergencies  [facility] immediately available    Physician(s)  Dr. Maylene Roes    Staff Present  [name] RN, Maxcine Ham, RN, BSN;Lisa Ysidro Evert, RN;Olinty Buckingham, MS, ACSM CEP, Exercise Physiologist    Medication changes reported      No    Fall or balance concerns reported     No    Tobacco Cessation  No Change    Warm-up and Cool-down  Performed as group-led instruction    Resistance Training Performed  Yes    VAD Patient?  No    PAD/SET Patient?  No      Pain Assessment   Currently in Pain?  No/denies    Pain Score  0-No pain    Multiple Pain Sites  No       Capillary Blood Glucose: No results found for this or any previous visit (from the past 24 hour(s)).  Exercise Prescription Changes - 08/11/18 1500      Response to Exercise   Blood Pressure (Admit)  132/78    Blood Pressure (Exercise)  124/60    Blood Pressure (Exit)  104/64    Heart Rate (Admit)  94 bpm    Heart Rate (Exercise)  98 bpm    Heart Rate (Exit)  76 bpm    Oxygen Saturation (Admit)  95 %    Oxygen Saturation (Exercise)  94 %    Oxygen Saturation (Exit)  93 %    Rating of Perceived Exertion (Exercise)  14    Perceived Dyspnea (Exercise)  2    Intensity  THRR unchanged      Resistance Training   Training Prescription  Yes    Weight  blue bands    Reps  10-15    Time  10 Minutes      Oxygen   Oxygen  Continuous    Liters  2      NuStep   Level  4    SPM  80    Minutes  34       Social History   Tobacco Use  Smoking Status Former Smoker  . Packs/day: 3.00  . Years: 54.00  . Pack  years: 162.00  . Types: Cigarettes  . Last attempt to quit: 07/11/2016  . Years since quitting: 2.0  Smokeless Tobacco Never Used    Goals Met:  Proper associated with RPD/PD & O2 Sat Exercise tolerated well Strength training completed today  Goals Unmet:  Not Applicable  Comments: Service time is from 1330 to 1510    Dr. Rush Farmer is Medical Director for Pulmonary Rehab at [facility].

## 2018-08-12 DIAGNOSIS — M87051 Idiopathic aseptic necrosis of right femur: Secondary | ICD-10-CM | POA: Diagnosis not present

## 2018-09-01 NOTE — Addendum Note (Signed)
Encounter addended by: Lance Morin, RN on: 09/01/2018 10:15 AM  Actions taken: Flowsheet data copied forward, Visit Navigator Flowsheet section accepted, Clinical Note Signed, Episode resolved

## 2018-09-01 NOTE — Progress Notes (Signed)
Discharge Progress Report  Patient Details  Name: Steve Riley MRN: [id number] Date of Birth: [birth year] Referring Provider:     Pulmonary Rehab Walk Test from 04/28/2018 in [facility]  Referring Provider  Dr. Nelda Marseille       Number of Visits: 25  Reason for Discharge:  Patient has met program and personal goals. His osteoarthritis limited him to perform weight bearing exercise comfortably, he is awaiting a hip replacement in the near future.  Smoking History:  Social History   Tobacco Use  Smoking Status Former Smoker  . Packs/day: 3.00  . Years: 54.00  . Pack years: 162.00  . Types: Cigarettes  . Last attempt to quit: 07/11/2016  . Years since quitting: 2.1  Smokeless Tobacco Never Used    Diagnosis:  Chronic obstructive pulmonary disease, unspecified COPD type ([place])  ADL UCSD: Pulmonary Assessment Scores    Row Name 04/30/18 0705 08/06/18 1616       ADL UCSD   ADL Phase  Entry  Entry    SOB Score total  -  74      CAT Score   CAT Score  -  pre 21      mMRC Score   mMRC Score  3  -       Initial Exercise Prescription: Initial Exercise Prescription - 04/30/18 0700      Date of Initial Exercise RX and Referring Provider   Date  04/28/18    Referring Provider  Dr. Nelda Marseille      Oxygen   Oxygen  Continuous    Liters  2      NuStep   Level  2    SPM  80    Minutes  17      Arm Ergometer   Level  2    Minutes  17      Track   Laps  8    Minutes  17      Prescription Details   Frequency (times per week)  2    Duration  Progress to 45 minutes of aerobic exercise without signs/symptoms of physical distress      Intensity   THRR 40-80% of Max Heartrate  60-121    Ratings of Perceived Exertion  11-13    Perceived Dyspnea  0-4      Progression   Progression  Continue to progress workloads to maintain intensity without signs/symptoms of physical distress.      Resistance Training   Training Prescription  Yes     Weight  blue bands    Reps  10-15       Discharge Exercise Prescription (Final Exercise Prescription Changes): Exercise Prescription Changes - 08/11/18 1500      Response to Exercise   Blood Pressure (Admit)  132/78    Blood Pressure (Exercise)  124/60    Blood Pressure (Exit)  104/64    Heart Rate (Admit)  94 bpm    Heart Rate (Exercise)  98 bpm    Heart Rate (Exit)  76 bpm    Oxygen Saturation (Admit)  95 %    Oxygen Saturation (Exercise)  94 %    Oxygen Saturation (Exit)  93 %    Rating of Perceived Exertion (Exercise)  14    Perceived Dyspnea (Exercise)  2    Intensity  THRR unchanged      Resistance Training   Training Prescription  Yes    Weight  blue bands    Reps  10-15    Time  10 Minutes      Oxygen   Oxygen  Continuous    Liters  2      NuStep   Level  4    SPM  80    Minutes  34       Functional Capacity: 6 Minute Walk    Row Name 04/30/18 0705         6 Minute Walk   Phase  Initial     Distance  500 feet     Walk Time  3.5 minutes     # of Rest Breaks  1     MPH  0.95     METS  1.77     RPE  11     Perceived Dyspnea   1     Symptoms  Yes (comment)     Comments  Pt was in the restroom for the last 2.5 minutes of test. I would have put O2 on him if not, as he desaturated down to 83% on RA. Pt reported low perceptual values although he was visiablly out of breath even 5 minutes post exercise.      Resting HR  70 bpm     Resting BP  124/82     Resting Oxygen Saturation   95 %     Exercise Oxygen Saturation  during 6 min walk  83 %     Max Ex. HR  94 bpm     Max Ex. BP  144/72     2 Minute Post BP  116/78       Interval HR   1 Minute HR  88     2 Minute HR  92     3 Minute HR  92     6 Minute HR  94     2 Minute Post HR  74     Interval Heart Rate?  Yes       Interval Oxygen   Interval Oxygen?  Yes     Baseline Oxygen Saturation %  95 %     1 Minute Oxygen Saturation %  93 %     1 Minute Liters of Oxygen  0 L     2 Minute Oxygen  Saturation %  87 %     2 Minute Liters of Oxygen  0 L     3 Minute Oxygen Saturation %  83 %     3 Minute Liters of Oxygen  0 L     6 Minute Oxygen Saturation %  90 %     6 Minute Liters of Oxygen  0 L     2 Minute Post Oxygen Saturation %  96 %     2 Minute Post Liters of Oxygen  0 L        Psychological, QOL, Others - Outcomes: PHQ 2/9: Depression screen [facility] 2 2/9 04/24/2018 04/24/2018 07/02/2017 03/13/2017  Decreased Interest 0 1 0 0  Down, Depressed, Hopeless 0 1 0 0  PHQ - 2 Score 0 2 0 0  Altered sleeping 0 0 - -  Tired, decreased energy 3 3 - -  Change in appetite 0 0 - -  Feeling bad or failure about yourself  2 1 - -  Trouble concentrating 1 1 - -  Moving slowly or fidgety/restless 0 0 - -  Suicidal thoughts 0 0 - -  PHQ-9 Score 6 7 - -  Difficult doing work/chores - Very difficult - -  Quality of Life:   Personal Goals: Goals established at orientation with interventions provided to work toward goal. Personal Goals and Risk Factors at Admission - 04/24/18 1036      Core Components/Risk Factors/Patient Goals on Admission    Weight Management  Yes    Intervention  Obesity: Provide education and appropriate resources to help participant work on and attain dietary goals.;Weight Management/Obesity: Establish reasonable short term and long term weight goals.;Weight Management: Develop a combined nutrition and exercise program designed to reach desired caloric intake, while maintaining appropriate intake of nutrient and fiber, sodium and fats, and appropriate energy expenditure required for the weight goal.    Admit Weight  200 lb 11.2 oz (91 kg)    Goal Weight: Short Term  190 lb (86.2 kg)    Goal Weight: Long Term  175 lb (79.4 kg)    Expected Outcomes  Short Term: Continue to assess and modify interventions until short term weight is achieved;Long Term: Adherence to nutrition and physical activity/exercise program aimed toward attainment of established weight  goal;Understanding of distribution of calorie intake throughout the day with the consumption of 4-5 meals/snacks;Understanding recommendations for meals to include 15-35% energy as protein, 25-35% energy from fat, 35-60% energy from carbohydrates, less than '200mg'$  of dietary cholesterol, 20-35 gm of total fiber daily;Weight Loss: Understanding of general recommendations for a balanced deficit meal plan, which promotes 1-2 lb weight loss per week and includes a negative energy balance of [phone] kcal/d    Improve shortness of breath with ADL's  Yes    Intervention  Provide education, individualized exercise plan and daily activity instruction to help decrease symptoms of SOB with activities of daily living.    Expected Outcomes  Short Term: Improve cardiorespiratory fitness to achieve a reduction of symptoms when performing ADLs;Long Term: Be able to perform more ADLs without symptoms or delay the onset of symptoms    Stress  Yes    Intervention  Offer individual and/or small group education and counseling on adjustment to heart disease, stress management and health-related lifestyle change. Teach and support self-help strategies.    Expected Outcomes  Short Term: Participant demonstrates changes in health-related behavior, relaxation and other stress management skills, ability to obtain effective social support, and compliance with psychotropic medications if prescribed.;Long Term: Emotional wellbeing is indicated by absence of clinically significant psychosocial distress or social isolation.        Personal Goals Discharge: Goals and Risk Factor Review    Row Name 05/19/18 0816 06/16/18 1634 07/14/18 1633 08/10/18 1409 09/01/18 1007     Core Components/Risk Factors/Patient Goals Review   Personal Goals Review  Weight Management/Obesity;Improve shortness of breath with ADL's;Increase knowledge of respiratory medications and ability to use respiratory devices properly.;Develop more efficient breathing  techniques such as purse lipped breathing and diaphragmatic breathing and practicing self-pacing with activity.  Weight Management/Obesity;Improve shortness of breath with ADL's;Increase knowledge of respiratory medications and ability to use respiratory devices properly.;Develop more efficient breathing techniques such as purse lipped breathing and diaphragmatic breathing and practicing self-pacing with activity.  Weight Management/Obesity;Improve shortness of breath with ADL's;Increase knowledge of respiratory medications and ability to use respiratory devices properly.;Develop more efficient breathing techniques such as purse lipped breathing and diaphragmatic breathing and practicing self-pacing with activity.  Weight Management/Obesity;Improve shortness of breath with ADL's;Increase knowledge of respiratory medications and ability to use respiratory devices properly.;Develop more efficient breathing techniques such as purse lipped breathing and diaphragmatic breathing and practicing self-pacing with activity.  Weight Management/Obesity;Improve shortness  of breath with ADL's;Increase knowledge of respiratory medications and ability to use respiratory devices properly.;Develop more efficient breathing techniques such as purse lipped breathing and diaphragmatic breathing and practicing self-pacing with activity.   Review  Has attended 3 exercise sessions, too early to have met goals, have contacted the [place] multiple times to set up home oxygen for him to use when he exercises, also dealing with severe hip pain and encouraged to see his PCP for evaluation.  Is limited in what he can do d/t hip pain.  Is being referred to an orthopedic surgeon, has not lost any weight, seems happy to be in the program, enjoys the interaction with piers  Has lost 2 kg, his shortness of breath has improved, but he is very limited physically due to his hip pain and cannot progress on his workloads  Has lost 3 kg, has been limited by  severe hip pain d/t osteoarthritis  Steve Riley has graduated from pulmonary rehab, he lost 4 kg, is definitely more knowledgeable re: respiratory medications and his disease process, he is performing efficient breathing techniques.  He is awaiting hip replacement and is unable to perform lower extremity exercise due to severe pain he experiences when he bears weight on his lower extremity.  He is unable to exercise at home until he has had his hip replacement.   Expected Outcomes  see admission core components  see admission core components  see admission core components  see admission core components  He met the program goals set forward except ability to exercise on at a high level which involves weight bearing on his lower extremities.      Exercise Goals and Review: Exercise Goals    Row Name 04/24/18 1029             Exercise Goals   Increase Physical Activity  Yes       Intervention  Provide advice, education, support and counseling about physical activity/exercise needs.;Develop an individualized exercise prescription for aerobic and resistive training based on initial evaluation findings, risk stratification, comorbidities and participant's personal goals.       Expected Outcomes  Short Term: Attend rehab on a regular basis to increase amount of physical activity.;Long Term: Exercising regularly at least 3-5 days a week.;Long Term: Add in home exercise to make exercise part of routine and to increase amount of physical activity.       Increase Strength and Stamina  Yes       Intervention  Provide advice, education, support and counseling about physical activity/exercise needs.;Develop an individualized exercise prescription for aerobic and resistive training based on initial evaluation findings, risk stratification, comorbidities and participant's personal goals.       Expected Outcomes  Short Term: Increase workloads from initial exercise prescription for resistance, speed, and METs.;Long  Term: Improve cardiorespiratory fitness, muscular endurance and strength as measured by increased METs and functional capacity (6MWT);Short Term: Perform resistance training exercises routinely during rehab and add in resistance training at home       Able to understand and use rate of perceived exertion (RPE) scale  Yes       Intervention  Provide education and explanation on how to use RPE scale       Expected Outcomes  Short Term: Able to use RPE daily in rehab to express subjective intensity level;Long Term:  Able to use RPE to guide intensity level when exercising independently       Able to understand and use Dyspnea scale  Yes  Intervention  Provide education and explanation on how to use Dyspnea scale       Expected Outcomes  Short Term: Able to use Dyspnea scale daily in rehab to express subjective sense of shortness of breath during exertion;Long Term: Able to use Dyspnea scale to guide intensity level when exercising independently       Knowledge and understanding of Target Heart Rate Range (THRR)  Yes       Intervention  Provide education and explanation of THRR including how the numbers were predicted and where they are located for reference       Expected Outcomes  Short Term: Able to state/look up THRR;Long Term: Able to use THRR to govern intensity when exercising independently       Understanding of Exercise Prescription  Yes       Intervention  Provide education, explanation, and written materials on patient's individual exercise prescription       Expected Outcomes  Short Term: Able to explain program exercise prescription;Long Term: Able to explain home exercise prescription to exercise independently          Exercise Goals Re-Evaluation: Exercise Goals Re-Evaluation    Row Name 05/20/18 0900 06/16/18 0844 07/13/18 0731 08/11/18 0747       Exercise Goal Re-Evaluation   Exercise Goals Review  Increase Physical Activity;Increase Strength and Stamina;Able to understand and  use rate of perceived exertion (RPE) scale;Able to understand and use Dyspnea scale;Knowledge and understanding of Target Heart Rate Range (THRR);Understanding of Exercise Prescription  Increase Physical Activity;Increase Strength and Stamina;Able to understand and use rate of perceived exertion (RPE) scale;Able to understand and use Dyspnea scale;Knowledge and understanding of Target Heart Rate Range (THRR);Understanding of Exercise Prescription  Increase Physical Activity;Increase Strength and Stamina;Able to understand and use rate of perceived exertion (RPE) scale;Able to understand and use Dyspnea scale;Knowledge and understanding of Target Heart Rate Range (THRR);Understanding of Exercise Prescription  Increase Physical Activity;Increase Strength and Stamina;Able to understand and use rate of perceived exertion (RPE) scale;Able to understand and use Dyspnea scale;Knowledge and understanding of Target Heart Rate Range (THRR);Understanding of Exercise Prescription    Comments  Pt has completed 4 exercise sessions. Pt needs oxygen at home, so I will not advise on home exercise at this time. Pt has hip pain that at times he rates 10/10. Because of this I have removed him from walking the track, and his primary exercise comes from the arm ergometer. An appointment for his hip pain is in the works. Progress will be hindered until the hip pain is resolved. Will continue to progress and monitor as able.   Pt is highly motivated to exercise, but has just recently been informed that he will need a total hip replacement. Pt is accepting of workload increases. Pt exercises at low MET levels (2.2). Will continue to monitor and progress as able.   Patient is progressing well in program despite his hip pain. Patient routinely rates pain 8-10/10. Patient is being evaluated for hip replacement. Patient should be getting his home oxygen this week from the [place]. Once he gets his home oxygen we will discuss home exercise. Patient  is not able to walk at this point due to his immense hip pain. Patient is motivated to make changes and seems to enjoy exercise. We tried to do the recumbent bike as something different in his exercise prescription but he was unable due to hip pain. Will cont. to monitor and progress as able.   Patient will graduate from  rehab today.    Expected Outcomes  Through exercise at rehab and at home, the patient will decrease shortness of breath with daily activities and feel confident in carrying out an exercise regime at home.   Through exercise at rehab and at home, the patient will decrease shortness of breath with daily activities and feel confident in carrying out an exercise regime at home.   Through exercise at rehab, patient will increase physical capacity, decrease shortness of breath, and feel confident in implementing and exercise routine at home. Also, when patient completes their graduation 6MWT they will increase their walk test distance by atleast 100 feet.   Through exercise at rehab, patient will increase physical capacity, decrease shortness of breath, and feel confident in implementing and exercise routine at home. Also, when patient completes their graduation 6MWT they will increase their walk test distance by atleast 100 feet.        Nutrition & Weight - Outcomes:    Nutrition: Nutrition Therapy & Goals - 08/28/18 1411      Nutrition Therapy   Diet  diabetic      Personal Nutrition Goals   Nutrition Goal  pt able to name foods that affect blood glucose    Personal Goal #2  Pt to identify and limit food sources of sodium, and refined carbohydrates   nutrition goal met   Personal Goal #3  Identify food quantities necessary to achieve wt loss of  -2# per week to a goal wt loss of 6-24 lb at graduation from pulmonary rehab.   weight loss goal met   Personal Goal #4  Describe the benefit of including fruits, vegetables, whole grains, and low-fat dairy products in a healthy meal plan.    nutrition goal met     Intervention Plan   Intervention  Prescribe, educate and counsel regarding individualized specific dietary modifications aiming towards targeted core components such as weight, hypertension, lipid management, diabetes, heart failure and other comorbidities.    Expected Outcomes  Short Term Goal: Understand basic principles of dietary content, such as calories, fat, sodium, cholesterol and nutrients.;Long Term Goal: Adherence to prescribed nutrition plan.       Nutrition Discharge: Nutrition Assessments - 08/07/18 1351      Rate Your Plate Scores   Pre Score  41    Post Score  36       Education Questionnaire Score: Knowledge Questionnaire Score - 08/06/18 1616      Knowledge Questionnaire Score   Pre Score  12/18    Post Score  14/18       Goals reviewed with patient; copy given to patient.

## 2018-09-06 DIAGNOSIS — J441 Chronic obstructive pulmonary disease with (acute) exacerbation: Secondary | ICD-10-CM | POA: Diagnosis not present

## 2018-10-08 DIAGNOSIS — J441 Chronic obstructive pulmonary disease with (acute) exacerbation: Secondary | ICD-10-CM | POA: Diagnosis not present

## 2018-11-07 DIAGNOSIS — J441 Chronic obstructive pulmonary disease with (acute) exacerbation: Secondary | ICD-10-CM | POA: Diagnosis not present

## 2018-12-08 DIAGNOSIS — J441 Chronic obstructive pulmonary disease with (acute) exacerbation: Secondary | ICD-10-CM | POA: Diagnosis not present

## 2019-01-07 DIAGNOSIS — J441 Chronic obstructive pulmonary disease with (acute) exacerbation: Secondary | ICD-10-CM | POA: Diagnosis not present

## 2019-02-07 DIAGNOSIS — J441 Chronic obstructive pulmonary disease with (acute) exacerbation: Secondary | ICD-10-CM | POA: Diagnosis not present

## 2019-02-22 ENCOUNTER — Ambulatory Visit (INDEPENDENT_AMBULATORY_CARE_PROVIDER_SITE_OTHER): Payer: No Typology Code available for payment source

## 2019-02-22 ENCOUNTER — Ambulatory Visit (INDEPENDENT_AMBULATORY_CARE_PROVIDER_SITE_OTHER): Payer: No Typology Code available for payment source | Admitting: Orthopaedic Surgery

## 2019-02-22 ENCOUNTER — Encounter: Payer: Self-pay | Admitting: Orthopaedic Surgery

## 2019-02-22 DIAGNOSIS — M87051 Idiopathic aseptic necrosis of right femur: Secondary | ICD-10-CM

## 2019-02-22 DIAGNOSIS — G8929 Other chronic pain: Secondary | ICD-10-CM

## 2019-02-22 DIAGNOSIS — M79604 Pain in right leg: Secondary | ICD-10-CM

## 2019-02-22 DIAGNOSIS — M5441 Lumbago with sciatica, right side: Secondary | ICD-10-CM

## 2019-02-22 NOTE — Progress Notes (Signed)
Office Visit Note   Patient: Steve Riley           Date of Birth: 09/27/1950           MRN: [id number] Visit Date: 02/22/2019              Requested by: No referring provider defined for this encounter. PCP: System, Pcp Not In   Assessment & Plan: Visit Diagnoses:  1. Pain in right leg   2. 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 Chronic right-sided low back pain with right-sided sciatica     Plan: He does have avascular necrosis of the right hip with femoral head collapse.  This is obviously been worsening over a short period of time.  At this point I would recommend a total hip arthroplasty.  Given the severity of his COPD and shortness of breath he does need his pulmonologist to clear him for surgery.  This could be done under spinal anesthesia hopefully.  I spent a long time explaining the risk and benefits of surgery and showing him what surgery involves using hip models.  I gave him a handout on hip replacement surgery.  We talked about the intraoperative and postoperative course.  We can get this scheduled once we do have pulmonary clearance for surgery.  All question concerns were otherwise answered and addressed.  Follow-Up Instructions: Return for 2 weeks post-op.   Orders:  Orders Placed This Encounter  Procedures  . XR HIP UNILAT W OR W/O PELVIS 1V RIGHT  . XR Lumbar Spine 2-3 Views   No orders of the defined types were placed in this encounter.     Procedures: No procedures performed   Clinical Data: No additional findings.   Subjective: Chief Complaint  Patient presents with  . Lower Back - Pain  . Right Hip - Pain  The patient is a veteran that I am seeing for the first time.  He has been having worsening right hip pain for 3 years now with pain mainly in the groin.  He is mainly having to now ambulate using a walker.  His pain is severe with that right hip.  Even watching him try to get up is painful to see because he has such a balance  issue.  He is acutely short of breath in the office today but this is a chronic issue.  I see from the charts that he has chronic COPD.  His right hip pain is severe.  It is 10 out of 10.  It is detrimentally affecting his actives a living, his quality of life and his mobility.  HPI  Review of Systems He currently denies any chest pain.  He denies any fever, chills, nausea, vomiting  Objective: Vital Signs: There were no vitals taken for this visit.  Physical Exam He is alert and orient x3 and in no acute distress but obvious shortness of breath. Ortho Exam He does report he still occasionally smokes.  His right hip shows severe pain on any attempts of range of motion.  There is left hip shows just a little bit of pain on range of motion. Specialty Comments:  No specialty comments available.  Imaging: Xr Hip Unilat W Or W/o Pelvis 1v Right  Result Date: 02/22/2019 An AP pelvis and lateral of the right hip shows end-stage avascular necrosis on the right hip.  There is actually flattening of the femoral head and some subchondral collapse.  The right knee hip is  located.  The left hip shows some early signs of avascular necrosis.  Xr Lumbar Spine 2-3 Views  Result Date: 02/22/2019 2 views of the lumbar spine    PMFS History: Patient Active Problem List   Diagnosis Date Noted  . COPD exacerbation ([place]) 03/06/2018  . 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 Malignant neoplasm of prostate ([place]) 03/12/2017   Past Medical History:  Diagnosis Date  . COPD (chronic obstructive pulmonary disease) ([place])   . Prostate cancer [facility])     Family History  Problem Relation Age of Onset  . Cancer Neg Hx     Past Surgical History:  Procedure Laterality Date  . BACK SURGERY    . HAND SURGERY    . PROSTATE BIOPSY     Social History   Occupational History  . Not on file  Tobacco Use  . Smoking status: Former Smoker    Packs/day: 3.00    Years: 54.00    Pack years: 162.00    Types: Cigarettes     Quit date: 07/11/2016    Years since quitting: 2.6  . Smokeless tobacco: Never Used  Substance and Sexual Activity  . Alcohol use: No  . Drug use: No  . Sexual activity: Not Currently

## 2019-03-01 ENCOUNTER — Telehealth: Payer: Self-pay | Admitting: Orthopaedic Surgery

## 2019-03-01 NOTE — Telephone Encounter (Signed)
02/22/19 ov note faxed to [facility] [phone]

## 2019-03-10 DIAGNOSIS — J441 Chronic obstructive pulmonary disease with (acute) exacerbation: Secondary | ICD-10-CM | POA: Diagnosis not present

## 2019-03-24 ENCOUNTER — Other Ambulatory Visit: Payer: Self-pay

## 2019-03-24 ENCOUNTER — Other Ambulatory Visit: Payer: Self-pay | Admitting: Physician Assistant

## 2019-03-30 NOTE — Progress Notes (Addendum)
[place], [place] - [id number] [place] [address] Phone: [phone] Fax: [phone]      Your procedure is scheduled on October 27  Report to [facility] Main Entrance "A" at 1000 A.M., and check in at the Admitting office.  Call this number if you have problems the morning of surgery:  [phone]  Call [phone] if you have any questions prior to your surgery date Monday-Friday 8am-4pm    Remember:  Do not eat after midnight the night before your surgery  You may drink clear liquids until 0900 am the morning of your surgery.   Clear liquids allowed are: Water, Non-Citrus Juices (without pulp), Carbonated Beverages, Clear Tea, Black Coffee Only, and Gatorade    Enhanced Recovery after Surgery for Orthopedics Enhanced Recovery after Surgery is a protocol used to improve the stress on your body and your recovery after surgery.  Patient Instructions  . The night before surgery:  o No food after midnight. ONLY clear liquids after midnight  .  Marland Kitchen The day of surgery (if you do NOT have diabetes):  o Drink ONE (1) Pre-Surgery Clear Ensure as directed.   o This drink was given to you during your hospital  pre-op appointment visit. o The pre-op nurse will instruct you on the time to drink the  Pre-Surgery Ensure depending on your surgery time. o Finish the drink at the designated time by the pre-op nurse.  o Nothing else to drink after completing the  Pre-Surgery Clear Ensure.         If you have questions, please contact your surgeon's office.   Take these medicines the morning of surgery with A SIP OF WATER  albuterol (PROVENTIL HFA;VENTOLIN HFA) if needed, Please bring all inhalers with you the day of surgery.  budesonide-formoterol (SYMBICORT)   7 days prior to surgery STOP taking any Aspirin (unless otherwise instructed by your surgeon), celecoxib (CELEBREX), Aleve, Naproxen, Ibuprofen, Motrin, Advil,  Goody's, BC's, all herbal medications, fish oil, and all vitamins.    The Morning of Surgery  Do not wear jewelry.  Do not wear lotions, powders, or colognes, or deodorant  Men may shave face and neck.  Do not bring valuables to the hospital.  [facility] is not responsible for any belongings or valuables.  If you are a smoker, DO NOT Smoke 24 hours prior to surgery IF you wear a CPAP at night please bring your mask, tubing, and machine the morning of surgery   Remember that you must have someone to transport you home after your surgery, and remain with you for 24 hours if you are discharged the same day.   Contacts, glasses, hearing aids, dentures or bridgework may not be worn into surgery.    Leave your suitcase in the car.  After surgery it may be brought to your room.  For patients admitted to the hospital, discharge time will be determined by your treatment team.  Patients discharged the day of surgery will not be allowed to drive home.    Special instructions:   [facility]- Preparing For Surgery  Before surgery, you can play an important role. Because skin is not sterile, your skin needs to be as free of germs as possible. You can reduce the number of germs on your skin by washing with CHG (chlorahexidine gluconate) Soap before surgery.  CHG is an antiseptic cleaner which kills germs and bonds with the skin to continue killing germs even after washing.  Oral Hygiene is also important to reduce your risk of infection.  Remember - BRUSH YOUR TEETH THE MORNING OF SURGERY WITH YOUR REGULAR TOOTHPASTE  Please do not use if you have an allergy to CHG or antibacterial soaps. If your skin becomes reddened/irritated stop using the CHG.  Do not shave (including legs and underarms) for at least 48 hours prior to first CHG shower. It is OK to shave your face.  Please follow these instructions carefully.   1. Shower the NIGHT BEFORE SURGERY and the MORNING OF SURGERY with CHG Soap.    2. If you chose to wash your hair, wash your hair first as usual with your normal shampoo.  3. After you shampoo, rinse your hair and body thoroughly to remove the shampoo.  4. Use CHG as you would any other liquid soap. You can apply CHG directly to the skin and wash gently with a scrungie or a clean washcloth.   5. Apply the CHG Soap to your body ONLY FROM THE NECK DOWN.  Do not use on open wounds or open sores. Avoid contact with your eyes, ears, mouth and genitals (private parts). Wash Face and genitals (private parts)  with your normal soap.   6. Wash thoroughly, paying special attention to the area where your surgery will be performed.  7. Thoroughly rinse your body with warm water from the neck down.  8. DO NOT shower/wash with your normal soap after using and rinsing off the CHG Soap.  9. Pat yourself dry with a CLEAN TOWEL.  10. Wear CLEAN PAJAMAS to bed the night before surgery, wear comfortable clothes the morning of surgery  11. Place CLEAN SHEETS on your bed the night of your first shower and DO NOT SLEEP WITH PETS.    Day of Surgery:  Do not apply any deodorants/lotions. Please shower the morning of surgery with the CHG soap  Please wear clean clothes to the hospital/surgery center.   Remember to brush your teeth WITH YOUR REGULAR TOOTHPASTE.   Please read over the following fact sheets that you were given.

## 2019-03-31 ENCOUNTER — Other Ambulatory Visit: Payer: Self-pay

## 2019-03-31 ENCOUNTER — Telehealth: Payer: Self-pay | Admitting: Orthopaedic Surgery

## 2019-03-31 ENCOUNTER — Encounter (HOSPITAL_COMMUNITY): Payer: Self-pay

## 2019-03-31 ENCOUNTER — Encounter (HOSPITAL_COMMUNITY)
Admission: RE | Admit: 2019-03-31 | Discharge: 2019-03-31 | Disposition: A | Discharge status: Home / Self Care | Payer: Medicare HMO | Source: Ambulatory Visit | Attending: Orthopaedic Surgery | Admitting: Orthopaedic Surgery

## 2019-03-31 DIAGNOSIS — Z8546 Personal history of malignant neoplasm of prostate: Secondary | ICD-10-CM | POA: Diagnosis not present

## 2019-03-31 DIAGNOSIS — J449 Chronic obstructive pulmonary disease, unspecified: Secondary | ICD-10-CM | POA: Diagnosis not present

## 2019-03-31 DIAGNOSIS — Z01812 Encounter for preprocedural laboratory examination: Secondary | ICD-10-CM | POA: Diagnosis not present

## 2019-03-31 DIAGNOSIS — Z79899 Other long term (current) drug therapy: Secondary | ICD-10-CM | POA: Insufficient documentation

## 2019-03-31 DIAGNOSIS — Z87891 Personal history of nicotine dependence: Secondary | ICD-10-CM | POA: Insufficient documentation

## 2019-03-31 HISTORY — DX: Pneumonia, unspecified organism: J18.9

## 2019-03-31 LAB — CBC
HCT: 45.6 % (ref 39.0–52.0)
Hemoglobin: 15.1 g/dL (ref 13.0–17.0)
MCH: 30.3 pg (ref 26.0–34.0)
MCHC: 33.1 g/dL (ref 30.0–36.0)
MCV: 91.6 fL (ref 80.0–100.0)
Platelets: 283 10*3/uL (ref 150–400)
RBC: 4.98 MIL/uL (ref 4.22–5.81)
RDW: 13.1 % (ref 11.5–15.5)
WBC: 7.2 10*3/uL (ref 4.0–10.5)
nRBC: 0 % (ref 0.0–0.2)

## 2019-03-31 LAB — SURGICAL PCR SCREEN
MRSA, PCR: NEGATIVE
Staphylococcus aureus: NEGATIVE

## 2019-03-31 NOTE — Telephone Encounter (Signed)
Patient left a voicemail stating that he has some questions in regards to his upcoming surgery.  CB#[phone].  Thank you.

## 2019-03-31 NOTE — Progress Notes (Addendum)
PCP:  Dr. Windy Fast at [facility] in [place] Cardiologist:  denies  EKG:  N/A CXR:  N/A ECHO: 03/09/18 Stress Test:  denies Cardiac Cath:  Denies  Covid testing scheduled 1023, 2020   Patient denies shortness of breath, fever, cough, and chest pain at PAT appointment.  Patient verbalized understanding of instructions provided today at the PAT appointment.  Patient asked to review instructions at home and day of surgery.

## 2019-04-01 NOTE — Telephone Encounter (Signed)
Questions answered.

## 2019-04-02 ENCOUNTER — Other Ambulatory Visit (HOSPITAL_COMMUNITY)
Admission: RE | Admit: 2019-04-02 | Discharge: 2019-04-02 | Disposition: A | Discharge status: Home / Self Care | Payer: No Typology Code available for payment source | Source: Ambulatory Visit | Attending: Orthopaedic Surgery | Admitting: Orthopaedic Surgery

## 2019-04-02 DIAGNOSIS — Z01812 Encounter for preprocedural laboratory examination: Secondary | ICD-10-CM | POA: Insufficient documentation

## 2019-04-02 DIAGNOSIS — Z20828 Contact with and (suspected) exposure to other viral communicable diseases: Secondary | ICD-10-CM | POA: Diagnosis not present

## 2019-04-03 LAB — NOVEL CORONAVIRUS, NAA (HOSP ORDER, SEND-OUT TO REF LAB; TAT 18-24 HRS): SARS-CoV-2, NAA: NOT DETECTED

## 2019-04-06 ENCOUNTER — Other Ambulatory Visit: Payer: Self-pay

## 2019-04-06 ENCOUNTER — Encounter (HOSPITAL_COMMUNITY): Payer: Self-pay

## 2019-04-06 ENCOUNTER — Encounter (HOSPITAL_COMMUNITY)
Admission: RE | Disposition: A | Discharge status: Home Health | Payer: Self-pay | Source: Home / Self Care | Attending: Orthopaedic Surgery

## 2019-04-06 ENCOUNTER — Inpatient Hospital Stay (HOSPITAL_COMMUNITY)
Admission: RE | Admit: 2019-04-06 | Discharge: 2019-04-12 | DRG: 470 | Disposition: A | Discharge status: Home Health | Payer: No Typology Code available for payment source | Attending: Orthopaedic Surgery | Admitting: Orthopaedic Surgery

## 2019-04-06 ENCOUNTER — Inpatient Hospital Stay (HOSPITAL_COMMUNITY): Payer: No Typology Code available for payment source

## 2019-04-06 ENCOUNTER — Inpatient Hospital Stay (HOSPITAL_COMMUNITY): Payer: No Typology Code available for payment source | Admitting: Certified Registered"

## 2019-04-06 DIAGNOSIS — Z20828 Contact with and (suspected) exposure to other viral communicable diseases: Secondary | ICD-10-CM | POA: Diagnosis present

## 2019-04-06 DIAGNOSIS — M87052 Idiopathic aseptic necrosis of left femur: Secondary | ICD-10-CM

## 2019-04-06 DIAGNOSIS — M87851 Other osteonecrosis, right femur: Secondary | ICD-10-CM | POA: Diagnosis present

## 2019-04-06 DIAGNOSIS — M87051 Idiopathic aseptic necrosis of right femur: Principal | ICD-10-CM

## 2019-04-06 DIAGNOSIS — Z96641 Presence of right artificial hip joint: Secondary | ICD-10-CM | POA: Diagnosis not present

## 2019-04-06 DIAGNOSIS — F17210 Nicotine dependence, cigarettes, uncomplicated: Secondary | ICD-10-CM | POA: Diagnosis present

## 2019-04-06 DIAGNOSIS — J449 Chronic obstructive pulmonary disease, unspecified: Secondary | ICD-10-CM | POA: Diagnosis present

## 2019-04-06 DIAGNOSIS — Z471 Aftercare following joint replacement surgery: Secondary | ICD-10-CM | POA: Diagnosis not present

## 2019-04-06 DIAGNOSIS — R0602 Shortness of breath: Secondary | ICD-10-CM

## 2019-04-06 DIAGNOSIS — Z419 Encounter for procedure for purposes other than remedying health state, unspecified: Secondary | ICD-10-CM

## 2019-04-06 HISTORY — PX: TOTAL HIP ARTHROPLASTY: SHX124

## 2019-04-06 LAB — ABO/RH: ABO/RH(D): A POS

## 2019-04-06 LAB — TYPE AND SCREEN
ABO/RH(D): A POS
Antibody Screen: NEGATIVE

## 2019-04-06 SURGERY — ARTHROPLASTY, HIP, TOTAL, ANTERIOR APPROACH
Anesthesia: Monitor Anesthesia Care | Site: Hip | Laterality: Right

## 2019-04-06 MED ORDER — ALUM & MAG HYDROXIDE-SIMETH 200-200-20 MG/5ML PO SUSP
30.0000 mL | ORAL | Status: DC | PRN
  Administered 2019-04-08: 30 mL via ORAL
  Filled 2019-04-06: qty 30

## 2019-04-06 MED ORDER — MENTHOL 3 MG MT LOZG
1.0000 | LOZENGE | OROMUCOSAL | Status: DC | PRN
  Filled 2019-04-06: qty 9

## 2019-04-06 MED ORDER — PHENOL 1.4 % MT LIQD
1.0000 | OROMUCOSAL | Status: DC | PRN
  Administered 2019-04-06: 20:00:00 1 via OROMUCOSAL
  Filled 2019-04-06: qty 177

## 2019-04-06 MED ORDER — OXYCODONE HCL 5 MG PO TABS
10.0000 mg | ORAL_TABLET | ORAL | Status: DC | PRN
  Administered 2019-04-06 – 2019-04-11 (×9): 10 mg via ORAL
  Administered 2019-04-11: 15 mg via ORAL
  Filled 2019-04-06: qty 3
  Filled 2019-04-06: qty 2
  Filled 2019-04-06: qty 3
  Filled 2019-04-06 (×2): qty 2

## 2019-04-06 MED ORDER — PROPOFOL 500 MG/50ML IV EMUL
INTRAVENOUS | Status: DC | PRN
  Administered 2019-04-06: 50 ug/kg/min via INTRAVENOUS

## 2019-04-06 MED ORDER — ACETAMINOPHEN 325 MG PO TABS
325.0000 mg | ORAL_TABLET | Freq: Four times a day (QID) | ORAL | Status: DC | PRN
  Administered 2019-04-09 – 2019-04-10 (×3): 650 mg via ORAL
  Filled 2019-04-06 (×3): qty 2

## 2019-04-06 MED ORDER — METHOCARBAMOL 1000 MG/10ML IJ SOLN
500.0000 mg | Freq: Four times a day (QID) | INTRAVENOUS | Status: DC | PRN
  Filled 2019-04-06: qty 5

## 2019-04-06 MED ORDER — OXYCODONE HCL 5 MG PO TABS
5.0000 mg | ORAL_TABLET | ORAL | Status: DC | PRN
  Administered 2019-04-07 – 2019-04-11 (×3): 10 mg via ORAL
  Filled 2019-04-06 (×8): qty 2

## 2019-04-06 MED ORDER — SODIUM CHLORIDE 0.9 % IV SOLN
INTRAVENOUS | Status: DC
  Administered 2019-04-06: 17:00:00 via INTRAVENOUS

## 2019-04-06 MED ORDER — FENTANYL CITRATE (PF) 100 MCG/2ML IJ SOLN
25.0000 ug | INTRAMUSCULAR | Status: DC | PRN
  Administered 2019-04-06 (×2): 50 ug via INTRAVENOUS

## 2019-04-06 MED ORDER — BUPIVACAINE IN DEXTROSE 0.75-8.25 % IT SOLN
INTRATHECAL | Status: DC | PRN
  Administered 2019-04-06: 2 mL via INTRATHECAL

## 2019-04-06 MED ORDER — MAGNESIUM OXIDE 400 (241.3 MG) MG PO TABS
400.0000 mg | ORAL_TABLET | Freq: Every day | ORAL | Status: DC
  Administered 2019-04-07 – 2019-04-12 (×6): 400 mg via ORAL
  Filled 2019-04-06 (×6): qty 1

## 2019-04-06 MED ORDER — KETOROLAC TROMETHAMINE 15 MG/ML IJ SOLN
15.0000 mg | Freq: Four times a day (QID) | INTRAMUSCULAR | Status: AC
  Administered 2019-04-06 – 2019-04-09 (×9): 15 mg via INTRAVENOUS
  Filled 2019-04-06 (×11): qty 1

## 2019-04-06 MED ORDER — PROPOFOL 10 MG/ML IV BOLUS
INTRAVENOUS | Status: DC | PRN
  Administered 2019-04-06 (×2): 20 mg via INTRAVENOUS

## 2019-04-06 MED ORDER — ZINC 50 MG PO TABS: 50.0000 mg | ORAL_TABLET | Freq: Every day | ORAL | Status: DC

## 2019-04-06 MED ORDER — VITAMIN C 500 MG PO TABS
1000.0000 mg | ORAL_TABLET | Freq: Every day | ORAL | Status: DC
  Administered 2019-04-06 – 2019-04-12 (×7): 1000 mg via ORAL
  Filled 2019-04-06 (×7): qty 2

## 2019-04-06 MED ORDER — METOCLOPRAMIDE HCL 5 MG/ML IJ SOLN: 5.0000 mg | Freq: Three times a day (TID) | INTRAMUSCULAR | Status: DC | PRN

## 2019-04-06 MED ORDER — OXYCODONE HCL 5 MG/5ML PO SOLN: 5.0000 mg | Freq: Once | ORAL | Status: AC | PRN

## 2019-04-06 MED ORDER — ACETAMINOPHEN 500 MG PO TABS: 1000.0000 mg | ORAL_TABLET | Freq: Once | ORAL | Status: DC | PRN

## 2019-04-06 MED ORDER — FENTANYL CITRATE (PF) 250 MCG/5ML IJ SOLN
INTRAMUSCULAR | Status: AC
  Filled 2019-04-06: qty 5

## 2019-04-06 MED ORDER — FENTANYL CITRATE (PF) 100 MCG/2ML IJ SOLN
INTRAMUSCULAR | Status: DC | PRN
  Administered 2019-04-06: 50 ug via INTRAVENOUS

## 2019-04-06 MED ORDER — CALCIUM CARBONATE-VITAMIN D 500-200 MG-UNIT PO TABS
1.0000 | ORAL_TABLET | Freq: Every day | ORAL | Status: DC
  Administered 2019-04-07 – 2019-04-12 (×6): 1 via ORAL
  Filled 2019-04-06 (×6): qty 1

## 2019-04-06 MED ORDER — SODIUM CHLORIDE 0.9 % IR SOLN
Status: DC | PRN
  Administered 2019-04-06: 3000 mL

## 2019-04-06 MED ORDER — LACTATED RINGERS IV SOLN
INTRAVENOUS | Status: DC
  Administered 2019-04-06 (×2): via INTRAVENOUS

## 2019-04-06 MED ORDER — METHOCARBAMOL 500 MG PO TABS
ORAL_TABLET | ORAL | Status: AC
  Filled 2019-04-06: qty 1

## 2019-04-06 MED ORDER — ONDANSETRON HCL 4 MG PO TABS: 4.0000 mg | ORAL_TABLET | Freq: Four times a day (QID) | ORAL | Status: DC | PRN

## 2019-04-06 MED ORDER — METOCLOPRAMIDE HCL 5 MG PO TABS: 5.0000 mg | ORAL_TABLET | Freq: Three times a day (TID) | ORAL | Status: DC | PRN

## 2019-04-06 MED ORDER — ZOLPIDEM TARTRATE 5 MG PO TABS
5.0000 mg | ORAL_TABLET | Freq: Every evening | ORAL | Status: DC | PRN
  Administered 2019-04-06: 5 mg via ORAL
  Filled 2019-04-06 (×2): qty 1

## 2019-04-06 MED ORDER — OXYCODONE HCL 5 MG PO TABS
ORAL_TABLET | ORAL | Status: AC
  Filled 2019-04-06: qty 1

## 2019-04-06 MED ORDER — POLYETHYLENE GLYCOL 3350 17 G PO PACK
17.0000 g | PACK | Freq: Every day | ORAL | Status: DC | PRN
  Filled 2019-04-06: qty 1

## 2019-04-06 MED ORDER — MOMETASONE FURO-FORMOTEROL FUM 100-5 MCG/ACT IN AERO
2.0000 | INHALATION_SPRAY | Freq: Two times a day (BID) | RESPIRATORY_TRACT | Status: DC
  Administered 2019-04-06 – 2019-04-12 (×12): 2 via RESPIRATORY_TRACT
  Filled 2019-04-06: qty 8.8

## 2019-04-06 MED ORDER — CLINDAMYCIN PHOSPHATE 900 MG/50ML IV SOLN
INTRAVENOUS | Status: AC
  Filled 2019-04-06: qty 50

## 2019-04-06 MED ORDER — DOCUSATE SODIUM 100 MG PO CAPS
100.0000 mg | ORAL_CAPSULE | Freq: Two times a day (BID) | ORAL | Status: DC
  Administered 2019-04-06 – 2019-04-12 (×12): 100 mg via ORAL
  Filled 2019-04-06 (×12): qty 1

## 2019-04-06 MED ORDER — PROPOFOL 10 MG/ML IV BOLUS
INTRAVENOUS | Status: AC
  Filled 2019-04-06: qty 20

## 2019-04-06 MED ORDER — HYDROMORPHONE HCL 1 MG/ML IJ SOLN
0.5000 mg | INTRAMUSCULAR | Status: DC | PRN
  Administered 2019-04-06: 1 mg via INTRAVENOUS
  Filled 2019-04-06: qty 1

## 2019-04-06 MED ORDER — MIDAZOLAM HCL 5 MG/5ML IJ SOLN
INTRAMUSCULAR | Status: DC | PRN
  Administered 2019-04-06 (×2): 1 mg via INTRAVENOUS

## 2019-04-06 MED ORDER — CALCIUM CARBONATE-VITAMIN D 600-400 MG-UNIT PO CHEW: 1.0000 | CHEWABLE_TABLET | Freq: Every day | ORAL | Status: DC

## 2019-04-06 MED ORDER — ALBUTEROL SULFATE (2.5 MG/3ML) 0.083% IN NEBU
3.0000 mL | INHALATION_SOLUTION | Freq: Four times a day (QID) | RESPIRATORY_TRACT | Status: DC | PRN
  Administered 2019-04-06 – 2019-04-12 (×8): 3 mL via RESPIRATORY_TRACT
  Filled 2019-04-06 (×7): qty 3

## 2019-04-06 MED ORDER — ONDANSETRON HCL 4 MG/2ML IJ SOLN
4.0000 mg | Freq: Four times a day (QID) | INTRAMUSCULAR | Status: DC | PRN
  Administered 2019-04-06: 17:00:00 4 mg via INTRAVENOUS
  Filled 2019-04-06 (×2): qty 2

## 2019-04-06 MED ORDER — FLURANDRENOLIDE 4 MCG/SQCM EX TAPE: 4.0000 | MEDICATED_TAPE | Freq: Every day | CUTANEOUS | Status: DC

## 2019-04-06 MED ORDER — MSM 1000 MG PO CAPS: 1000.0000 mg | ORAL_CAPSULE | Freq: Two times a day (BID) | ORAL | Status: DC

## 2019-04-06 MED ORDER — POVIDONE-IODINE 10 % EX SWAB: 2.0000 "application " | Freq: Once | CUTANEOUS | Status: DC

## 2019-04-06 MED ORDER — CLINDAMYCIN PHOSPHATE 600 MG/50ML IV SOLN
600.0000 mg | Freq: Four times a day (QID) | INTRAVENOUS | Status: AC
  Administered 2019-04-06 – 2019-04-07 (×2): 600 mg via INTRAVENOUS
  Filled 2019-04-06 (×2): qty 50

## 2019-04-06 MED ORDER — OXYCODONE HCL 5 MG PO TABS
5.0000 mg | ORAL_TABLET | Freq: Once | ORAL | Status: AC | PRN
  Administered 2019-04-06: 5 mg via ORAL

## 2019-04-06 MED ORDER — MIDAZOLAM HCL 2 MG/2ML IJ SOLN
INTRAMUSCULAR | Status: AC
  Filled 2019-04-06: qty 2

## 2019-04-06 MED ORDER — FENTANYL CITRATE (PF) 100 MCG/2ML IJ SOLN
INTRAMUSCULAR | Status: AC
  Filled 2019-04-06: qty 2

## 2019-04-06 MED ORDER — DIPHENHYDRAMINE HCL 12.5 MG/5ML PO ELIX: 12.5000 mg | ORAL_SOLUTION | ORAL | Status: DC | PRN

## 2019-04-06 MED ORDER — TRANEXAMIC ACID-NACL 1000-0.7 MG/100ML-% IV SOLN
1000.0000 mg | INTRAVENOUS | Status: AC
  Administered 2019-04-06: 13:00:00 1000 mg via INTRAVENOUS
  Filled 2019-04-06: qty 100

## 2019-04-06 MED ORDER — CLINDAMYCIN PHOSPHATE 900 MG/50ML IV SOLN
900.0000 mg | INTRAVENOUS | Status: AC
  Administered 2019-04-06: 13:00:00 900 mg via INTRAVENOUS

## 2019-04-06 MED ORDER — PROPOFOL 1000 MG/100ML IV EMUL
INTRAVENOUS | Status: AC
  Filled 2019-04-06: qty 100

## 2019-04-06 MED ORDER — ASPIRIN 81 MG PO CHEW
81.0000 mg | CHEWABLE_TABLET | Freq: Two times a day (BID) | ORAL | Status: DC
  Administered 2019-04-06 – 2019-04-12 (×12): 81 mg via ORAL
  Filled 2019-04-06 (×12): qty 1

## 2019-04-06 MED ORDER — ACETAMINOPHEN 10 MG/ML IV SOLN: 1000.0000 mg | Freq: Once | INTRAVENOUS | Status: DC | PRN

## 2019-04-06 MED ORDER — METHOCARBAMOL 500 MG PO TABS
500.0000 mg | ORAL_TABLET | Freq: Four times a day (QID) | ORAL | Status: DC | PRN
  Administered 2019-04-06 – 2019-04-11 (×6): 500 mg via ORAL
  Filled 2019-04-06 (×5): qty 1

## 2019-04-06 MED ORDER — PANTOPRAZOLE SODIUM 40 MG PO TBEC
40.0000 mg | DELAYED_RELEASE_TABLET | Freq: Every day | ORAL | Status: DC
  Administered 2019-04-07 – 2019-04-12 (×4): 40 mg via ORAL
  Filled 2019-04-06 (×5): qty 1

## 2019-04-06 MED ORDER — ZINC SULFATE 220 (50 ZN) MG PO CAPS
220.0000 mg | ORAL_CAPSULE | Freq: Every day | ORAL | Status: DC
  Administered 2019-04-07 – 2019-04-12 (×6): 220 mg via ORAL
  Filled 2019-04-06 (×6): qty 1

## 2019-04-06 MED ORDER — TRANEXAMIC ACID-NACL 1000-0.7 MG/100ML-% IV SOLN
INTRAVENOUS | Status: AC
  Filled 2019-04-06: qty 100

## 2019-04-06 MED ORDER — CHLORHEXIDINE GLUCONATE 4 % EX LIQD: 60.0000 mL | Freq: Once | CUTANEOUS | Status: DC

## 2019-04-06 MED ORDER — ACETAMINOPHEN 160 MG/5ML PO SOLN: 1000.0000 mg | Freq: Once | ORAL | Status: DC | PRN

## 2019-04-06 MED ORDER — 0.9 % SODIUM CHLORIDE (POUR BTL) OPTIME
TOPICAL | Status: DC | PRN
Start: 2019-04-06 — End: 2019-04-06
  Administered 2019-04-06: 1000 mL

## 2019-04-06 SURGICAL SUPPLY — 60 items
ACETAB CUP W GRIPTION 54MM (Plate) ×1 IMPLANT
ACETAB CUP W/GRIPTION 54 (Plate) ×2 IMPLANT
APL SKNCLS STERI-STRIP NONHPOA (GAUZE/BANDAGES/DRESSINGS) ×1
BALL HIP ARTICU EZE 36 8.5 (Hips) IMPLANT
BENZOIN TINCTURE PRP APPL 2/3 (GAUZE/BANDAGES/DRESSINGS) ×3 IMPLANT
BLADE CLIPPER SURG (BLADE) IMPLANT
BLADE SAW SGTL 18X1.27X75 (BLADE) ×2 IMPLANT
BLADE SAW SGTL 18X1.27X75MM (BLADE) ×1
CLOSURE WOUND 1/2 X4 (GAUZE/BANDAGES/DRESSINGS) ×2
COVER SURGICAL LIGHT HANDLE (MISCELLANEOUS) ×3 IMPLANT
COVER WAND RF STERILE (DRAPES) ×3 IMPLANT
CUP ACETAB W/GRIPTION 54 (Plate) IMPLANT
DRAPE C-ARM 42X72 X-RAY (DRAPES) ×3 IMPLANT
DRAPE STERI IOBAN 125X83 (DRAPES) ×3 IMPLANT
DRAPE U-SHAPE 47X51 STRL (DRAPES) ×9 IMPLANT
DRSG AQUACEL AG ADV 3.5X10 (GAUZE/BANDAGES/DRESSINGS) ×3 IMPLANT
DRSG XEROFORM 1X8 (GAUZE/BANDAGES/DRESSINGS) ×2 IMPLANT
DURAPREP 26ML APPLICATOR (WOUND CARE) ×3 IMPLANT
ELECT BLADE 4.0 EZ CLEAN MEGAD (MISCELLANEOUS) ×3
ELECT BLADE 6.5 EXT (BLADE) IMPLANT
ELECT REM PT RETURN 9FT ADLT (ELECTROSURGICAL) ×3
ELECTRODE BLDE 4.0 EZ CLN MEGD (MISCELLANEOUS) ×1 IMPLANT
ELECTRODE REM PT RTRN 9FT ADLT (ELECTROSURGICAL) ×1 IMPLANT
FACESHIELD WRAPAROUND (MASK) ×6 IMPLANT
FACESHIELD WRAPAROUND OR TEAM (MASK) ×2 IMPLANT
GLOVE BIOGEL PI IND STRL 8 (GLOVE) ×2 IMPLANT
GLOVE BIOGEL PI INDICATOR 8 (GLOVE) ×4
GLOVE ECLIPSE 8.0 STRL XLNG CF (GLOVE) ×3 IMPLANT
GLOVE ORTHO TXT STRL SZ7.5 (GLOVE) ×6 IMPLANT
GOWN STRL REUS W/ TWL LRG LVL3 (GOWN DISPOSABLE) ×2 IMPLANT
GOWN STRL REUS W/ TWL XL LVL3 (GOWN DISPOSABLE) ×2 IMPLANT
GOWN STRL REUS W/TWL LRG LVL3 (GOWN DISPOSABLE) ×6
GOWN STRL REUS W/TWL XL LVL3 (GOWN DISPOSABLE) ×4
HANDPIECE INTERPULSE COAX TIP (DISPOSABLE) ×3
HIP BALL ARTICU EZE 36 8.5 (Hips) ×3 IMPLANT
KIT BASIN OR (CUSTOM PROCEDURE TRAY) ×3 IMPLANT
KIT TURNOVER KIT B (KITS) ×3 IMPLANT
LINER NEUTRAL 54X36MM PLUS 4 (Hips) ×2 IMPLANT
MANIFOLD NEPTUNE II (INSTRUMENTS) ×3 IMPLANT
NS IRRIG 1000ML POUR BTL (IV SOLUTION) ×3 IMPLANT
PACK TOTAL JOINT (CUSTOM PROCEDURE TRAY) ×3 IMPLANT
PAD ARMBOARD 7.5X6 YLW CONV (MISCELLANEOUS) ×3 IMPLANT
SET HNDPC FAN SPRY TIP SCT (DISPOSABLE) ×1 IMPLANT
STAPLER VISISTAT 35W (STAPLE) IMPLANT
STEM FEM ACTIS HIGH SZ7 (Stem) ×2 IMPLANT
STRIP CLOSURE SKIN 1/2X4 (GAUZE/BANDAGES/DRESSINGS) ×4 IMPLANT
SUT ETHIBOND NAB CT1 #1 30IN (SUTURE) ×3 IMPLANT
SUT MNCRL AB 4-0 PS2 18 (SUTURE) IMPLANT
SUT VIC AB 0 CT1 27 (SUTURE) ×3
SUT VIC AB 0 CT1 27XBRD ANBCTR (SUTURE) ×1 IMPLANT
SUT VIC AB 1 CT1 27 (SUTURE) ×3
SUT VIC AB 1 CT1 27XBRD ANBCTR (SUTURE) ×1 IMPLANT
SUT VIC AB 2-0 CT1 27 (SUTURE) ×3
SUT VIC AB 2-0 CT1 TAPERPNT 27 (SUTURE) ×1 IMPLANT
TOWEL GREEN STERILE (TOWEL DISPOSABLE) ×3 IMPLANT
TOWEL GREEN STERILE FF (TOWEL DISPOSABLE) ×3 IMPLANT
TRAY CATH 16FR W/PLASTIC CATH (SET/KITS/TRAYS/PACK) IMPLANT
TRAY FOLEY W/BAG SLVR 16FR (SET/KITS/TRAYS/PACK) ×3
TRAY FOLEY W/BAG SLVR 16FR ST (SET/KITS/TRAYS/PACK) IMPLANT
WATER STERILE IRR 1000ML POUR (IV SOLUTION) ×6 IMPLANT

## 2019-04-06 NOTE — Op Note (Signed)
NAME: LONAN, WESTENSKOW MEDICAL RECORD [id number] ACCOUNT [id number] DATE OF BIRTH:1950-09-20 FACILITY: [facility] LOCATION: [facility] PHYSICIAN:Alexandre Faries Kerry Fort, MD  OPERATIVE REPORT  DATE OF PROCEDURE:  04/06/2019  PREOPERATIVE DIAGNOSIS:  End-stage avascular necrosis, right hip.  POSTOPERATIVE DIAGNOSIS:  End-stage avascular necrosis, right hip.  PROCEDURE:  Right total hip arthroplasty through direct anterior approach.  IMPLANTS:  DePuy Sector Gription acetabular component size 54, size 36+4 neutral polyethylene liner, size 7 Actis femoral component with high offset, size 36+8.5 metal hip ball.  SURGEON:  Lind Guest. Ninfa Linden, MD  ASSISTANT:  Erskine Emery, PA-C.  ANESTHESIA:  Spinal.  ANTIBIOTICS:  900 mg IV clindamycin.  ESTIMATED BLOOD LOSS:  250-300 mL.  COMPLICATIONS:  None.  INDICATIONS:  The patient is a 68 year old gentleman with avascular necrosis involving both his hips with the left much more severe than the right.  His left hip has been hurting him significantly today but the right hip is the one way worsening and he  agrees with that as well.  He agrees with that as well.  He has had some evidence of femoral head collapse.  He just wanted to let me know that the left hip is starting to bother him significantly and he has radiographic evidence of severe avascular  necrosis of the right hip and moderate on the left hip.  He will be in need of a left hip replacement at some point today we are focused on the right hip.  He understands fully the rationale behind proceeding with surgery.  His pain is daily and it is  severe.  At this point, his right hip pain is detrimentally affecting his mobility his quality of life and his activities of daily living to the point he does need to proceed with a total hip arthroplasty.  With that being said, we did talk to him about  the risk of acute blood loss anemia, nerve and vessel injury, fracture, infection, DVT, dislocation,  implant failure.  We talked about our goals being decreased pain, improve mobility and overall improve quality of life.  DESCRIPTION OF PROCEDURE:  After informed consent was obtained and appropriate right hip was marked, he was brought to the operating room and sat up on a stretcher where spinal anesthesia was then obtained.  He was then laid in supine position on the  stretcher.  Foley catheter was placed and we were able to assess his leg lengths and found him shorter on his right operative side than the left side.  Traction boots were placed on both his feet.  Next, he was placed supine on the Hana fracture table,  the perineal post in place and both legs in line skeletal traction device and no traction applied.  His right operative hip was prepped and draped with DuraPrep and sterile drapes.  A time-out was called.  He was identified as correct patient, correct  right hip.  We then made an incision just inferior and posterior to the anterior superior iliac spine and carried this obliquely down the leg.  We dissected down tensor fascia lata muscle.  Tensor fascia was then divided longitudinally to proceed with  direct anterior approach to the hip.  We identified and cauterized circumflex vessels.  I then identified the hip capsule entered the hip capsule in an L-type format, finding moderate joint effusion.  We placed curved retractors around the medial and  lateral femoral neck and then made our femoral neck cut with an oscillating saw just proximal to the lesser trochanter and  completed this with an osteotome.  We placed a corkscrew guide in the femoral head and removed the femoral heads entirety and found  significant evidence of avascular necrosis with collapse of the femoral head.  I then cleaned the remnants of the acetabular labrum and other debris from the acetabulum and placed a bent Hohmann over the medial acetabular rim and then began reaming  under direct visualization from a size 43 reamer  in stepwise increments up to a size 53 with all reamers under direct visualization, the last reamer under direct fluoroscopy, so we could obtain our depth in reaming our inclination and anteversion.  I did  feel I could medialize the cup just enough, so we did put the real size 54 acetabular component again under direct visualization and fluoroscopy and I was pleased with the position, but we went with a 36+4 liner because I medialized him quite a bit.  We  then turned attention to the femur.  With the leg externally rotated to 120 degrees, extended and adducted we were able to place a Mueller retractor medially and Hohman retractor behind the greater trochanter.  We released the lateral joint capsule and  used a box-cutting osteotome to enter the femoral canal and a rongeur to lateralize then began broaching using the Corail broaching system from a size zero up to a size 7.  With a size 7 in place, we trialed a high offset femoral neck and a 36+1.5 hip  ball, reduced this in the acetabulum and definitely needed lengthening and based on the clinical exam and instability.  We dislocated the hip and removed the trial components.  We placed the real high offset Actis femoral component size 7 from DePuy.  We  went with a 36+8.5 metal hip ball, reduced this in the acetabulum and at that point, I was pleased with range of motion and stability accessed  mechanically and radiographically.  His leg length and offset were improved significantly as well.  We then  irrigated the soft tissue with normal saline solution using pulsatile lavage.  We closed the joint capsule with #1 Ethibond suture, followed by closing the tensor fascia with #1 Vicryl, 0 Vicryl was used to close deep tissue, 2-0 Vicryl to close the  subcutaneous tissue and we used staples closed the skin.  Xeroform and Aquacel dressing was applied.  He was taken off the Hana table and taken to recovery room in stable condition.  All final counts were correct.   There were no complications noted.  Of  note, Benita Stabile, PA-C, assisted the entire case.  His assistance was crucial for facilitating all aspects of this case.  TN/NUANCE  D:04/06/2019 T:04/06/2019 JOB:[id number]

## 2019-04-06 NOTE — Transfer of Care (Signed)
Immediate Anesthesia Transfer of Care Note  Patient: Steve Riley  Procedure(s) Performed: RIGHT TOTAL HIP ARTHROPLASTY ANTERIOR APPROACH (Right Hip)  Patient Location: PACU  Anesthesia Type:MAC combined with regional for post-op pain  Level of Consciousness: awake, alert  and oriented  Airway & Oxygen Therapy: Patient Spontanous Breathing and Patient connected to nasal cannula oxygen  Post-op Assessment: Report given to RN, Post -op Vital signs reviewed and stable and Patient moving all extremities  Post vital signs: Reviewed and stable  Last Vitals:  Vitals Value Taken Time  BP 133/79 04/06/19 1407  Temp 36.9 C 04/06/19 1407  Pulse 79 04/06/19 1408  Resp 18 04/06/19 1408  SpO2 98 % 04/06/19 1408  Vitals shown include unvalidated device data.  Last Pain:  Vitals:   04/06/19 1110  TempSrc:   PainSc: 5       Patients Stated Pain Goal: 3 (21/11/73 5670)  Complications: No apparent anesthesia complications

## 2019-04-06 NOTE — Brief Op Note (Signed)
04/06/2019  1:50 PM  PATIENT:  Steve Riley  68 y.o. male  PRE-OPERATIVE DIAGNOSIS:  avascular necrosis right hip  POST-OPERATIVE DIAGNOSIS:  avascular necrosis right hip  PROCEDURE:  Procedure(s): RIGHT TOTAL HIP ARTHROPLASTY ANTERIOR APPROACH (Right)  SURGEON:  Surgeon(s) and Role:    Mcarthur Rossetti, MD - Primary  PHYSICIAN ASSISTANT: Benita Stabile, PA-C  ANESTHESIA:   spinal  EBL:  250 mL   COUNTS:  YES  DICTATION: .Other Dictation: Dictation Number [id number]  PLAN OF CARE: Admit to inpatient   PATIENT DISPOSITION:  PACU - hemodynamically stable.   Delay start of Pharmacological VTE agent (>24hrs) due to surgical blood loss or risk of bleeding: no

## 2019-04-06 NOTE — Anesthesia Procedure Notes (Signed)
Spinal  Patient location during procedure: OR Start time: 04/06/2019 12:22 PM End time: 04/06/2019 12:30 PM Staffing Anesthesiologist: Oleta Mouse, MD Performed: anesthesiologist  Preanesthetic Checklist Completed: patient identified, surgical consent, pre-op evaluation, timeout performed, IV checked, risks and benefits discussed and monitors and equipment checked Spinal Block Patient position: sitting Prep: DuraPrep Patient monitoring: heart rate, cardiac monitor, continuous pulse ox and blood pressure Approach: midline Location: L3-4 Injection technique: single-shot Needle Needle type: Pencan  Needle gauge: 24 G Needle length: 9 cm Assessment Sensory level: T6

## 2019-04-06 NOTE — Anesthesia Preprocedure Evaluation (Signed)
Anesthesia Evaluation  Patient identified by MRN, date of birth, ID band Patient awake    Reviewed: Allergy & Precautions, NPO status , Patient's Chart, lab work & pertinent test results  History of Anesthesia Complications Negative for: history of anesthetic complications  Airway Mallampati: III  TM Distance: >3 FB Neck ROM: Full    Dental  (+) Edentulous Upper, Edentulous Lower, Upper Dentures, Lower Dentures   Pulmonary COPD,  COPD inhaler, neg recent URI, Current SmokerPatient did not abstain from smoking.,    breath sounds clear to auscultation       Cardiovascular negative cardio ROS   Rhythm:Regular     Neuro/Psych negative neurological ROS  negative psych ROS   GI/Hepatic negative GI ROS, Neg liver ROS,   Endo/Other    Renal/GU      Musculoskeletal avascular necrosis right hip   Abdominal   Peds  Hematology negative hematology ROS (+)   Anesthesia Other Findings Left ventricle: The cavity size was normal. Wall thickness was   normal. Systolic function was normal. The estimated ejection   fraction was in the range of 60% to 65%. - Aortic valve: Valve area (VTI): 2.41 cm^2. Valve area (Vmax):   2.79 cm^2. Valve area (Vmean): 2.68 cm^2.  Reproductive/Obstetrics                             Anesthesia Physical Anesthesia Plan  ASA: II  Anesthesia Plan: MAC and Spinal   Post-op Pain Management:    Induction: Intravenous  PONV Risk Score and Plan: 0 and Treatment may vary due to age or medical condition and Propofol infusion  Airway Management Planned: Nasal Cannula  Additional Equipment: None  Intra-op Plan:   Post-operative Plan:   Informed Consent: I have reviewed the patients History and Physical, chart, labs and discussed the procedure including the risks, benefits and alternatives for the proposed anesthesia with the patient or authorized representative who has  indicated his/her understanding and acceptance.     Dental advisory given  Plan Discussed with: CRNA and Surgeon  Anesthesia Plan Comments:         Anesthesia Quick Evaluation

## 2019-04-06 NOTE — Plan of Care (Signed)
  Problem: Education: Goal: Knowledge of General Education information will improve Description: Including pain rating scale, medication(s)/side effects and non-pharmacologic comfort measures Outcome: Progressing   Problem: Nutrition: Goal: Adequate nutrition will be maintained Outcome: Progressing   Problem: Safety: Goal: Ability to remain free from injury will improve Outcome: Progressing   

## 2019-04-06 NOTE — H&P (Signed)
TOTAL HIP ADMISSION H&P  Patient is admitted for right total hip arthroplasty.  Subjective:  Chief Complaint: right hip pain  HPI: Steve Riley, 68 y.o. male, has a history of pain and functional disability in the right hip(s) due to avascular necrosis and patient has failed non-surgical conservative treatments for greater than 12 weeks to include NSAID's and/or analgesics, flexibility and strengthening excercises, use of assistive devices and activity modification.  Onset of symptoms was gradual starting 3 years ago with rapidlly worsening course since that time.The patient noted no past surgery on the right hip(s).  Patient currently rates pain in the right hip at 10 out of 10 with activity. Patient has night pain, worsening of pain with activity and weight bearing, trendelenberg gait, pain that interfers with activities of daily living and pain with passive range of motion. Patient has evidence of subchondral cysts and joint space narrowing by imaging studies. This condition presents safety issues increasing the risk of falls.  There is no current active infection.  Patient Active Problem List   Diagnosis Date Noted  . Avascular necrosis of bone of right hip ([place]) 04/06/2019  . COPD exacerbation ([place]) 03/06/2018  . 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 Malignant neoplasm of prostate ([place]) 03/12/2017   Past Medical History:  Diagnosis Date  . COPD (chronic obstructive pulmonary disease) ([place])   . Pneumonia   . Prostate cancer [facility])     Past Surgical History:  Procedure Laterality Date  . BACK SURGERY    . HAND SURGERY    . PROSTATE BIOPSY      Current Facility-Administered Medications  Medication Dose Route Frequency Provider Last Rate Last Dose  . clindamycin (CLEOCIN) 900 MG/50ML IVPB           . tranexamic acid (CYKLOKAPRON) 1000MG /125mL IVPB            Allergies  Allergen Reactions  . Penicillin G Other (See Comments)    "passed out"/childhood allergy Did it involve swelling  of the face/tongue/throat, SOB, or low BP? No Did it involve sudden or severe rash/hives, skin peeling, or any reaction on the inside of your mouth or nose? No Did you need to seek medical attention at a hospital or doctor's office? No When did it last happen?childhood allergy If all above answers are "NO", may proceed with cephalosporin use.     Social History   Tobacco Use  . Smoking status: Current Some Day Smoker    Packs/day: 3.00    Years: 54.00    Pack years: 162.00    Types: Cigarettes    Last attempt to quit: 07/11/2016    Years since quitting: 2.7  . Smokeless tobacco: Never Used  . Tobacco comment: occassionally 1-3 per week  Substance Use Topics  . Alcohol use: No    Family History  Problem Relation Age of Onset  . Cancer Neg Hx      Review of Systems  Musculoskeletal: Positive for back pain and joint pain.  All other systems reviewed and are negative.   Objective:  Physical Exam  Constitutional: He is oriented to person, place, and time. He appears well-developed and well-nourished.  HENT:  Head: Normocephalic and atraumatic.  Eyes: Pupils are equal, round, and reactive to light. EOM are normal.  Neck: Normal range of motion. Neck supple.  Cardiovascular: Normal rate.  Respiratory: Effort normal.  GI: Soft.  Musculoskeletal:     Right hip: He exhibits decreased range of motion, decreased strength, tenderness and bony tenderness.  Neurological: He is alert and oriented to person, place, and time.  Skin: Skin is warm and dry.  Psychiatric: He has a normal mood and affect.    Vital signs in last 24 hours: Temp:  [98.2 F (36.8 C)] 98.2 F (36.8 C) (10/27 1047) Pulse Rate:  [83] 83 (10/27 1047) Resp:  [18] 18 (10/27 1047) BP: (129)/(98) 129/98 (10/27 1047) SpO2:  [95 %] 95 % (10/27 1047) Weight:  [89.8 kg] 89.8 kg (10/27 1047)  Labs:   Estimated body mass index is 30.11 kg/m as calculated from the following:   Height as of this encounter:  5\' 8"  (1.727 m).   Weight as of this encounter: 89.8 kg.   Imaging Review Plain radiographs demonstrate severe avascular necrosis of the right hip(s). The bone quality appears to be good for age and reported activity level.      Assessment/Plan:  End avascular necrosis, right hip(s)  The patient history, physical examination, clinical judgement of the provider and imaging studies are consistent with end stage AVN of the right hip(s) and total hip arthroplasty is deemed medically necessary. The treatment options including medical management, injection therapy, arthroscopy and arthroplasty were discussed at length. The risks and benefits of total hip arthroplasty were presented and reviewed. The risks due to aseptic loosening, infection, stiffness, dislocation/subluxation,  thromboembolic complications and other imponderables were discussed.  The patient acknowledged the explanation, agreed to proceed with the plan and consent was signed. Patient is being admitted for inpatient treatment for surgery, pain control, PT, OT, prophylactic antibiotics, VTE prophylaxis, progressive ambulation and ADL's and discharge planning.The patient is planning to be discharged home with home health services

## 2019-04-07 ENCOUNTER — Inpatient Hospital Stay (HOSPITAL_COMMUNITY): Payer: No Typology Code available for payment source

## 2019-04-07 LAB — CBC
HCT: 36.1 % — ABNORMAL LOW (ref 39.0–52.0)
Hemoglobin: 12.3 g/dL — ABNORMAL LOW (ref 13.0–17.0)
MCH: 30.5 pg (ref 26.0–34.0)
MCHC: 34.1 g/dL (ref 30.0–36.0)
MCV: 89.6 fL (ref 80.0–100.0)
Platelets: 227 10*3/uL (ref 150–400)
RBC: 4.03 MIL/uL — ABNORMAL LOW (ref 4.22–5.81)
RDW: 12.8 % (ref 11.5–15.5)
WBC: 11.2 10*3/uL — ABNORMAL HIGH (ref 4.0–10.5)
nRBC: 0 % (ref 0.0–0.2)

## 2019-04-07 LAB — BASIC METABOLIC PANEL
Anion gap: 7 (ref 5–15)
BUN: 12 mg/dL (ref 8–23)
CO2: 23 mmol/L (ref 22–32)
Calcium: 8.6 mg/dL — ABNORMAL LOW (ref 8.9–10.3)
Chloride: 102 mmol/L (ref 98–111)
Creatinine, Ser: 1 mg/dL (ref 0.61–1.24)
GFR calc Af Amer: >60 mL/min (ref >60)
GFR calc non Af Amer: >60 mL/min (ref >60)
Glucose, Bld: 131 mg/dL — ABNORMAL HIGH (ref 70–99)
Potassium: 3.8 mmol/L (ref 3.5–5.1)
Sodium: 132 mmol/L — ABNORMAL LOW (ref 135–145)

## 2019-04-07 MED ORDER — LORAZEPAM 0.5 MG PO TABS
0.5000 mg | ORAL_TABLET | Freq: Once | ORAL | Status: AC
  Administered 2019-04-07: 0.5 mg via ORAL
  Filled 2019-04-07: qty 1

## 2019-04-07 NOTE — TOC Initial Note (Signed)
Transition of Care [facility]) - Initial/Assessment Note    Patient Details  Name: Steve Riley MRN: [id number] Date of Birth: 02-27-1951  Transition of Care [facility]) CM/SW Contact:    Atilano Median, LCSW Phone Number: 04/07/2019, 2:54 PM  Clinical Narrative:                 Patient admitted for R hip replacement. Per PT eval patient will require some short term rehab at a skilled nursing facility. Patient has [employer] but states that he would like to use his [employer] instead. CSW to follow up on the accuracy of this. Plan is for SNF when medically ready pending insurance approval and bed offer.   Expected Discharge Plan: Skilled Nursing Facility Barriers to Discharge: Continued Medical Work up, Ship broker   Patient Goals and CMS Choice Patient states their goals for this hospitalization and ongoing recovery are:: snf to get better; pain control CMS Medicare.gov Compare Post Acute Care list provided to:: Other (Comment Required)(Karen Loma Sousa)    Expected Discharge Plan and Services Expected Discharge Plan: Skilled Nursing Facility In-house Referral: Clinical Social Work   Post Acute Care Choice: [place]               Prior Living Arrangements/Services              Need for Family Participation in Patient Care: Yes (Comment) Care giver support system in place?: Yes (comment)      Activities of Daily Living Home Assistive Devices/Equipment: Eyeglasses, Dentures (specify type), Hearing aid, Oxygen, Walker (specify type) ADL Screening (condition at time of admission) Patient's cognitive ability adequate to safely complete daily activities?: Yes Is the patient deaf or have difficulty hearing?: Yes Does the patient have difficulty seeing, even when wearing glasses/contacts?: No Does the patient have difficulty concentrating, remembering, or making decisions?: No Patient able to express need for assistance with ADLs?: Yes Does the patient  have difficulty dressing or bathing?: No Independently performs ADLs?: Yes (appropriate for developmental age) Does the patient have difficulty walking or climbing stairs?: Yes Weakness of Legs: Both Weakness of Arms/Hands: None  Permission Sought/Granted Permission sought to share information with : Family Supports Permission granted to share information with : Yes, Verbal Permission Granted  Share Information with NAME: Marja Kays     Permission granted to share info w Relationship: neighbor/friend  Permission granted to share info w Contact Information: [phone]  Emotional Assessment       Orientation: : Oriented to Self, Oriented to Place, Oriented to  Time, Oriented to Situation      Admission diagnosis:  avascular necrosis right hip Patient Active Problem List   Diagnosis Date Noted  . Avascular necrosis of bone of right hip ([place]) 04/06/2019  . 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 COPD exacerbation ([place]) 03/06/2018  . 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 Malignant neoplasm of prostate ([place]) 03/12/2017   PCP:  System, Pcp Not In Pharmacy:   [place], [place] - [address] [place] [address] Phone: [phone] Fax: [phone]     Social Determinants of Health (SDOH) Interventions    Readmission Risk Interventions No flowsheet data found.

## 2019-04-07 NOTE — Evaluation (Signed)
Physical Therapy Evaluation Patient Details Name: Steve Riley MRN: [id number] DOB: [birth year] Today's Date: 04/07/2019   History of Present Illness  Pt is a 68 y/o male s/p R THA, direct anterior approach secondary to avascular necrosis. PMH including but not limited to COPD.    Clinical Impression  Pt presented supine in bed with HOB elevated, awake and willing to participate in therapy session. Prior to admission, pt reported that he ambulated with use of a cane and was independent with ADLs. Pt lives alone in a two level home (lives on main level) with one step to enter. Pt stated that he would not really have much assistance from family/friends upon d/c as that "don't have time to help". At the time of evaluation, pt performed bed mobility with min A and transfers with min guard to min A with RW. Pt very limited secondary to increasing pain, as well as increased DOE and work of breathing with movement. Pt on 2L of supplemental O2 throughout with SPO2 maintaining >90%. At this time based on pt's performance during evaluation, recommending pt d/c to SNF for short-term rehab prior to returning home alone.   Pt would continue to benefit from skilled physical therapy services at this time while admitted and after d/c to address the below listed limitations in order to improve overall safety and independence with functional mobility.     Follow Up Recommendations SNF;Supervision/Assistance - 24 hour;Other (comment)(pending progression with mobility)    Equipment Recommendations  3in1 (PT)    Recommendations for Other Services       Precautions / Restrictions Precautions Precautions: Fall Restrictions Weight Bearing Restrictions: Yes RLE Weight Bearing: Weight bearing as tolerated      Mobility  Bed Mobility Overal bed mobility: Needs Assistance Bed Mobility: Supine to Sit     Supine to sit: Min assist     General bed mobility comments: assistance needed to move R LE off of  bed and achieve upright sitting at EOB  Transfers Overall transfer level: Needs assistance Equipment used: Rolling walker (2 wheeled) Transfers: Sit to/from [facility] Sit to Stand: Min assist Stand pivot transfers: Min guard       General transfer comment: assistance for stability to transition into standing from a seated position; close min guard with pivotal movement to recliner chair towards pt's L side  Ambulation/Gait             General Gait Details: deferred secondary to increasing pain and DOE with increased work of breathing; pt with 2L of supplemental O2 donned throughout and SPO2 maintaining >90%  Stairs            Wheelchair Mobility    Modified Rankin (Stroke Patients Only)       Balance Overall balance assessment: Needs assistance Sitting-balance support: Feet supported Sitting balance-Leahy Scale: Fair     Standing balance support: Bilateral upper extremity supported Standing balance-Leahy Scale: Poor                               Pertinent Vitals/Pain Pain Assessment: Faces Faces Pain Scale: Hurts even more Pain Location: R hip Pain Descriptors / Indicators: Guarding;Grimacing Pain Intervention(s): Monitored during session;Repositioned    Home Living Family/patient expects to be discharged to:: Private residence Living Arrangements: Alone Available Help at Discharge: Family;Friend(s);Available PRN/intermittently Type of Home: House Home Access: Stairs to enter   Entrance Stairs-Number of Steps: 1 Home Layout: Two level;Able to live  on main level with bedroom/bathroom Home Equipment: Walker - 2 wheels;Cane - single point      Prior Function Level of Independence: Independent with assistive device(s)         Comments: ambulates with cane     Hand Dominance        Extremity/Trunk Assessment   Upper Extremity Assessment Upper Extremity Assessment: Overall WFL for tasks assessed    Lower  Extremity Assessment Lower Extremity Assessment: RLE deficits/detail RLE Deficits / Details: pt with decreased strength and ROM limitations secondary to post-op pain and weakness       Communication   Communication: No difficulties  Cognition Arousal/Alertness: Awake/alert Behavior During Therapy: WFL for tasks assessed/performed Overall Cognitive Status: Within Functional Limits for tasks assessed                                        General Comments      Exercises     Assessment/Plan    PT Assessment Patient needs continued PT services  PT Problem List Decreased strength;Decreased range of motion;Decreased activity tolerance;Decreased balance;Decreased mobility;Decreased coordination;Decreased knowledge of use of DME;Decreased safety awareness;Decreased knowledge of precautions;Pain       PT Treatment Interventions Gait training;DME instruction;Stair training;Functional mobility training;Therapeutic exercise;Therapeutic activities;Balance training;Neuromuscular re-education;Patient/family education    PT Goals (Current goals can be found in the Care Plan section)  Acute Rehab PT Goals Patient Stated Goal: decrease pain PT Goal Formulation: With patient Time For Goal Achievement: 04/21/19 Potential to Achieve Goals: Good    Frequency 7X/week   Barriers to discharge        Co-evaluation               AM-PAC PT "6 Clicks" Mobility  Outcome Measure Help needed turning from your back to your side while in a flat bed without using bedrails?: A Little Help needed moving from lying on your back to sitting on the side of a flat bed without using bedrails?: A Little Help needed moving to and from a bed to a chair (including a wheelchair)?: A Little Help needed standing up from a chair using your arms (e.g., wheelchair or bedside chair)?: A Little Help needed to walk in hospital room?: A Little Help needed climbing 3-5 steps with a railing? : A Lot 6  Click Score: 17    End of Session Equipment Utilized During Treatment: Gait belt Activity Tolerance: Patient limited by pain Patient left: in chair;with call bell/phone within reach Nurse Communication: Mobility status PT Visit Diagnosis: Other abnormalities of gait and mobility (R26.89);Pain Pain - Right/Left: Right Pain - part of body: Hip    Time: [id number] PT Time Calculation (min) (ACUTE ONLY): 23 min   Charges:   PT Evaluation $PT Eval Moderate Complexity: 1 Mod PT Treatments $Therapeutic Activity: 8-22 mins        Sherie Don, PT, DPT  Acute Rehabilitation Services Pager [phone] Office [place] 04/07/2019, 12:23 PM

## 2019-04-07 NOTE — Progress Notes (Addendum)
Pt's respiratory pattern: regular, labored, symmetrical; 24 resp per minute; O2 97 on 2L. HR 94. RRT is aware.

## 2019-04-07 NOTE — Anesthesia Postprocedure Evaluation (Signed)
Anesthesia Post Note  Patient: Steve Riley  Procedure(s) Performed: RIGHT TOTAL HIP ARTHROPLASTY ANTERIOR APPROACH (Right Hip)     Patient location during evaluation: PACU Anesthesia Type: MAC and Spinal Level of consciousness: awake and alert Pain management: pain level controlled Vital Signs Assessment: post-procedure vital signs reviewed and stable Respiratory status: spontaneous breathing, nonlabored ventilation, respiratory function stable and patient connected to nasal cannula oxygen Cardiovascular status: stable and blood pressure returned to baseline Postop Assessment: no apparent nausea or vomiting and spinal receding Anesthetic complications: no    Last Vitals:  Vitals:   04/07/19 0737 04/07/19 0823  BP: (!) 114/54   Pulse: 84 85  Resp: 19 19  Temp: 36.6 C   SpO2: 93% 95%    Last Pain:  Vitals:   04/07/19 0938  TempSrc:   PainSc: 6                  Barth Trella

## 2019-04-07 NOTE — NC FL2 (Signed)
[place] MEDICAID FL2 LEVEL OF CARE SCREENING TOOL     IDENTIFICATION  Patient Name: Steve Riley Birthdate: 1950-06-11 Sex: male Admission Date (Current Location): 04/06/2019  [facility] and [place] Number:  Herbalist and Address:  [facility] [place], [place] [address], [place], [place] [address]      Provider Number:    Attending Physician Name and Address:  Mcarthur Rossetti,*  Relative Name and Phone Number:  Lavalle Kenna [phone]    Current Level of Care: SNF Recommended Level of Care: [place] Prior Approval Number:    Date Approved/Denied:   PASRR Number: [id number]  Discharge Plan: SNF    Current Diagnoses: Patient Active Problem List   Diagnosis Date Noted  . Avascular necrosis of bone of right hip ([place]) 04/06/2019  . 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 COPD exacerbation ([place]) 03/06/2018  . 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 Malignant neoplasm of prostate ([place]) 03/12/2017    Orientation RESPIRATION BLADDER Height & Weight     Self, Time, Situation, Place  O2(2 liters oxygen) Continent Weight: 198 lb (89.8 kg) Height:  5\' 8"  (172.7 cm)  BEHAVIORAL SYMPTOMS/MOOD NEUROLOGICAL BOWEL NUTRITION STATUS      Continent Diet  AMBULATORY STATUS COMMUNICATION OF NEEDS Skin   Supervision Verbally Normal, Surgical wounds                       Personal Care Assistance Level of Assistance  Bathing, Dressing Bathing Assistance: Maximum assistance   Dressing Assistance: Maximum assistance     Functional Limitations Info  Hearing   Hearing Info: Impaired      SPECIAL CARE FACTORS FREQUENCY  PT (By licensed PT), OT (By licensed OT)     PT Frequency: 5 times a week OT Frequency: 5 times a week            Contractures      Additional Factors Info  Allergies   Allergies Info: Penicillin G           Current Medications (04/07/2019):  This is the current hospital active  medication list Current Facility-Administered Medications  Medication Dose Route Frequency Provider Last Rate Last Dose  . 0.9 %  sodium chloride infusion   Intravenous Continuous Mcarthur Rossetti, MD 75 mL/hr at 04/06/19 1723    . acetaminophen (TYLENOL) tablet 325-650 mg  325-650 mg Oral Q6H PRN Mcarthur Rossetti, MD      . albuterol (PROVENTIL) (2.5 MG/3ML) 0.083% nebulizer solution 3 mL  3 mL Inhalation Q6H PRN Mcarthur Rossetti, MD   3 mL at 04/07/19 1059  . alum & mag hydroxide-simeth (MAALOX/MYLANTA) 200-200-20 MG/5ML suspension 30 mL  30 mL Oral Q4H PRN Mcarthur Rossetti, MD      . aspirin chewable tablet 81 mg  81 mg Oral BID Mcarthur Rossetti, MD   81 mg at 04/07/19 [id number]  . calcium-vitamin D (OSCAL WITH D) 500-200 MG-UNIT per tablet 1 tablet  1 tablet Oral Q breakfast Mcarthur Rossetti, MD   1 tablet at 04/07/19 [phone]  . diphenhydrAMINE (BENADRYL) 12.5 MG/5ML elixir 12.5-25 mg  12.5-25 mg Oral Q4H PRN Mcarthur Rossetti, MD      . docusate sodium (COLACE) capsule 100 mg  100 mg Oral BID Mcarthur Rossetti, MD   100 mg at 04/07/19 0940  . HYDROmorphone (DILAUDID) injection 0.5-1 mg  0.5-1 mg Intravenous Q4H PRN Mcarthur Rossetti, MD  1 mg at 04/06/19 1713  . ketorolac (TORADOL) 15 MG/ML injection 15 mg  15 mg Intravenous Q6H Mcarthur Rossetti, MD   15 mg at 04/07/19 1202  . magnesium oxide (MAG-OX) tablet 400 mg  400 mg Oral Daily Mcarthur Rossetti, MD   400 mg at 04/07/19 [id number]  . menthol-cetylpyridinium (CEPACOL) lozenge 3 mg  1 lozenge Oral PRN Mcarthur Rossetti, MD       Or  . phenol (CHLORASEPTIC) mouth spray 1 spray  1 spray Mouth/Throat PRN Mcarthur Rossetti, MD   1 spray at 04/06/19 1958  . methocarbamol (ROBAXIN) tablet 500 mg  500 mg Oral Q6H PRN Mcarthur Rossetti, MD   500 mg at 04/07/19 [id number]   Or  . methocarbamol (ROBAXIN) 500 mg in dextrose 5 % 50 mL IVPB  500 mg Intravenous Q6H PRN Mcarthur Rossetti, MD      . metoCLOPramide (REGLAN) tablet 5-10 mg  5-10 mg Oral Q8H PRN Mcarthur Rossetti, MD       Or  . metoCLOPramide (REGLAN) injection 5-10 mg  5-10 mg Intravenous Q8H PRN Mcarthur Rossetti, MD      . mometasone-formoterol [facility]) 100-5 MCG/ACT inhaler 2 puff  2 puff Inhalation BID Mcarthur Rossetti, MD   2 puff at 04/07/19 [phone]  . ondansetron (ZOFRAN) tablet 4 mg  4 mg Oral Q6H PRN Mcarthur Rossetti, MD       Or  . ondansetron [facility]) injection 4 mg  4 mg Intravenous Q6H PRN Mcarthur Rossetti, MD   4 mg at 04/06/19 1713  . oxyCODONE (Oxy IR/ROXICODONE) immediate release tablet 10-15 mg  10-15 mg Oral Q4H PRN Mcarthur Rossetti, MD   10 mg at 04/07/19 1456  . oxyCODONE (Oxy IR/ROXICODONE) immediate release tablet 5-10 mg  5-10 mg Oral Q4H PRN Mcarthur Rossetti, MD   10 mg at 04/07/19 [id number]  . pantoprazole (PROTONIX) EC tablet 40 mg  40 mg Oral Daily Mcarthur Rossetti, MD   40 mg at 04/07/19 [id number]  . polyethylene glycol (MIRALAX / GLYCOLAX) packet 17 g  17 g Oral Daily PRN Mcarthur Rossetti, MD      . vitamin C (ASCORBIC ACID) tablet 1,000 mg  1,000 mg Oral Daily Mcarthur Rossetti, MD   1,000 mg at 04/07/19 [id number]  . zinc sulfate capsule 220 mg  220 mg Oral Daily Mcarthur Rossetti, MD   220 mg at 04/07/19 0940  . zolpidem (AMBIEN) tablet 5 mg  5 mg Oral QHS PRN Mcarthur Rossetti, MD   5 mg at 04/06/19 2226     Discharge Medications: Please see discharge summary for a list of discharge medications.  Relevant Imaging Results:  Relevant Lab Results:   Additional Information    Atilano Median, LCSW

## 2019-04-07 NOTE — Progress Notes (Signed)
Foley catheter removed per order, pt tolerated NAD. Pt acknowledges need to inform staff when need to void manifest.

## 2019-04-07 NOTE — Progress Notes (Addendum)
Physical Therapy Treatment Patient Details Name: Steve Riley MRN: [id number] DOB: [birth year] Today's Date: 04/07/2019    History of Present Illness Pt is a 68 y/o male s/p R THA, direct anterior approach secondary to avascular necrosis. PMH including but not limited to COPD.    PT Comments    Pt remains very limited secondary to weakness, fatigue, increased DOE and increased work of breathing with very minimal activity. Pt gasping for air consistently throughout session. Pt on RA throughout with SPO2 maintaining >90%. Pt also very nauseous throughout session. RN aware. Pt would continue to benefit from skilled physical therapy services at this time while admitted and after d/c to address the below listed limitations in order to improve overall safety and independence with functional mobility.    Follow Up Recommendations  SNF;Supervision/Assistance - 24 hour     Equipment Recommendations  None recommended by PT    Recommendations for Other Services       Precautions / Restrictions Precautions Precautions: Fall Restrictions Weight Bearing Restrictions: Yes RLE Weight Bearing: Weight bearing as tolerated    Mobility  Bed Mobility Overal bed mobility: Needs Assistance Bed Mobility: Supine to Sit;Sit to Supine     Supine to sit: Min guard Sit to supine: Min assist   General bed mobility comments: increased time and effort, assistance for return of R LE onto bed  Transfers Overall transfer level: Needs assistance Equipment used: Rolling walker (2 wheeled) Transfers: Sit to/from Stand Sit to Stand: Min assist Stand pivot transfers: Min guard       General transfer comment: min A for safety and stability with transfer  Ambulation/Gait             General Gait Details: deferred secondary to increased DOE and work of breathing. Pt gasping for air throughout, exacerbated with standing and sitting EOB; pt also with frequent urgency of urination   Stairs              Wheelchair Mobility    Modified Rankin (Stroke Patients Only)       Balance Overall balance assessment: Needs assistance Sitting-balance support: Feet supported Sitting balance-Leahy Scale: Fair     Standing balance support: Bilateral upper extremity supported;During functional activity Standing balance-Leahy Scale: Poor                              Cognition Arousal/Alertness: Awake/alert Behavior During Therapy: WFL for tasks assessed/performed;Anxious Overall Cognitive Status: Within Functional Limits for tasks assessed                                        Exercises Total Joint Exercises Ankle Circles/Pumps: AROM;Both;10 reps;Seated Long Arc Quad: AROM;Strengthening;Right;10 reps;Seated    General Comments        Pertinent Vitals/Pain Pain Assessment: Faces Faces Pain Scale: Hurts little more Pain Location: R hip Pain Descriptors / Indicators: Guarding;Grimacing;Burning Pain Intervention(s): Monitored during session;Repositioned    Home Living Family/patient expects to be discharged to:: Private residence Living Arrangements: Alone Available Help at Discharge: Family;Friend(s);Available PRN/intermittently Type of Home: House Home Access: Stairs to enter   Home Layout: Two level;Able to live on main level with bedroom/bathroom Home Equipment: Gilford Rile - 2 wheels;Cane - single point      Prior Function Level of Independence: Independent with assistive device(s)      Comments: ambulates with cane and  states that he was independent with ADLs/selfcare and home mgt   PT Goals (current goals can now be found in the care plan section) Acute Rehab PT Goals Patient Stated Goal: decrease pain PT Goal Formulation: With patient Time For Goal Achievement: 04/21/19 Potential to Achieve Goals: Good Progress towards PT goals: Progressing toward goals    Frequency    7X/week      PT Plan Current plan remains appropriate     Co-evaluation              AM-PAC PT "6 Clicks" Mobility   Outcome Measure  Help needed turning from your back to your side while in a flat bed without using bedrails?: A Little Help needed moving from lying on your back to sitting on the side of a flat bed without using bedrails?: A Little Help needed moving to and from a bed to a chair (including a wheelchair)?: A Little Help needed standing up from a chair using your arms (e.g., wheelchair or bedside chair)?: A Little Help needed to walk in hospital room?: A Lot Help needed climbing 3-5 steps with a railing? : A Lot 6 Click Score: 16    End of Session Equipment Utilized During Treatment: Gait belt Activity Tolerance: Patient limited by pain Patient left: in bed;with call bell/phone within reach;with bed alarm set;with family/visitor present Nurse Communication: Mobility status PT Visit Diagnosis: Other abnormalities of gait and mobility (R26.89);Pain Pain - Right/Left: Right Pain - part of body: Hip     Time: [id number] PT Time Calculation (min) (ACUTE ONLY): 27 min  Charges:  $Therapeutic Activity: 23-37 mins                     Sherie Don, PT, DPT  Acute Rehabilitation Services Pager [phone] Office [place] 04/07/2019, 4:08 PM

## 2019-04-07 NOTE — Progress Notes (Signed)
Subjective: 1 Day Post-Op Procedure(s) (LRB): RIGHT TOTAL HIP ARTHROPLASTY ANTERIOR APPROACH (Right) Patient reports pain as moderate.    Objective: Vital signs in last 24 hours: Temp:  [97.8 F (36.6 C)-100.9 F (38.3 C)] 97.8 F (36.6 C) (10/28 0737) Pulse Rate:  [66-98] 85 (10/28 0823) Resp:  [11-24] 19 (10/28 0823) BP: (100-162)/(54-112) 114/54 (10/28 0737) SpO2:  [92 %-100 %] 95 % (10/28 0823) Weight:  [89.8 kg] 89.8 kg (10/27 1110)  Intake/Output from previous day: 10/27 0701 - 10/28 0700 In: 1029.6 [P.O.:480; I.V.:549.6] Out: 950 [Urine:700; Blood:250] Intake/Output this shift: No intake/output data recorded.  Recent Labs    04/07/19 0326  HGB 12.3*   Recent Labs    04/07/19 0326  WBC 11.2*  RBC 4.03*  HCT 36.1*  PLT 227   Recent Labs    04/07/19 0326  NA 132*  K 3.8  CL 102  CO2 23  BUN 12  CREATININE 1.00  GLUCOSE 131*  CALCIUM 8.6*   No results for input(s): LABPT, INR in the last 72 hours.  Sensation intact distally Intact pulses distally Dorsiflexion/Plantar flexion intact Incision: dressing C/D/I   Assessment/Plan: 1 Day Post-Op Procedure(s) (LRB): RIGHT TOTAL HIP ARTHROPLASTY ANTERIOR APPROACH (Right) Up with therapy Disposition will depend on his mobility with therapy.  Anticipated LOS equal to or greater than 2 midnights due to - Age 69 and older with one or more of the following:  - Obesity  - Expected need for hospital services (PT, OT, Nursing) required for safe  discharge  - Anticipated need for postoperative skilled nursing care or inpatient rehab  - Active co-morbidities: None OR   - Unanticipated findings during/Post Surgery: Slow post-op progression: GI, pain control, mobility  - Patient is a high risk of re-admission due to: None    Mcarthur Rossetti 04/07/2019, 8:52 AM

## 2019-04-07 NOTE — Significant Event (Addendum)
Rapid Response Event Note  Overview: Respiratory  Initial Focused Assessment: Called by nurse with concerns of patient having tachypnea and shallow breathing, per nurse, saturations are good on 2L [place] but patient seems to be anxious perhaps. I came to Steve Riley, he was working to breath some and short of breath but he seemed to be uncomfortable and anxious. He said he felt kind of "jumpy", diminished lung sounds - 98% on 2L. RR - 28-30 -- labored but I feel like this isn't a COPD exacerbation because patient drifts asleep and then his breathing is not longer labored. Otherwise stable, pain is 5/10  Interventions: -- Ativan 0.5 mg POx 1 -- STAT CXR  Plan of Care: -- Patient educated on the importance of staying ahead of pain and taking PRN pain medications as needed -- Monitor RR/O2 saturations, wean oxygen -- Home COPD medications restarted as well. -- Reassess in an hour, if improves then continue post op course -- I called for an update at 1645 - per nurse, patient had improved overall, had periods of fast breathing at times but much improved now.  Event Summary:  Call Time [place]   Steve Riley R

## 2019-04-07 NOTE — Evaluation (Signed)
Occupational Therapy Evaluation Patient Details Name: Steve Riley MRN: [id number] DOB: 08-12-1950 Today's Date: 04/07/2019    History of Present Illness Pt is a 68 y/o male s/p R THA, direct anterior approach secondary to avascular necrosis. PMH including but not limited to COPD.   Clinical Impression   Pt with decline in function and safety with ADLs and ADL mobility with impaired strength, balance ane endurance. Pt is HOH and very anxious. Pt reports that PTA, he lived at home alone and was independent with ADLs/selfcare and home mgt and that he used a cane and RW for mobility. Pt now requires mod - total A for LB ADLs, mod A for toileting and min - min guard A with RW for mobility. Pt states that he doesn't really have anyone at home that would have time to assist him. Pt would benefit from acute OT services to address impairments to maximize level of function and safety    Follow Up Recommendations  SNF(may be able to have [facility] if progressing well)    Equipment Recommendations  3 in 1 bedside commode;Tub/shower seat;Other (comment)(reacher)    Recommendations for Other Services       Precautions / Restrictions Precautions Precautions: Fall Restrictions Weight Bearing Restrictions: Yes RLE Weight Bearing: Weight bearing as tolerated      Mobility Bed Mobility               General bed mobility comments: pt sitting EOB upon arrival  Transfers Overall transfer level: Needs assistance Equipment used: Rolling walker (2 wheeled) Transfers: Sit to/from [facility] Sit to Stand: Min assist Stand pivot transfers: Min guard       General transfer comment: cues for safety    Balance Overall balance assessment: Needs assistance Sitting-balance support: Feet supported Sitting balance-Leahy Scale: Fair     Standing balance support: Bilateral upper extremity supported;During functional activity Standing balance-Leahy Scale: Poor                              ADL either performed or assessed with clinical judgement   ADL Overall ADL's : Needs assistance/impaired Eating/Feeding: Independent;Sitting   Grooming: Wash/dry hands;Wash/dry face;Set up;Supervision/safety;Sitting   Upper Body Bathing: Set up;Supervision/ safety;Sitting   Lower Body Bathing: Moderate assistance   Upper Body Dressing : Set up;Supervision/safety;Sitting   Lower Body Dressing: Total assistance   Toilet Transfer: Minimal assistance;Min guard;Ambulation;RW;Comfort height toilet;Cueing for safety   Toileting- Clothing Manipulation and Hygiene: Moderate assistance       Functional mobility during ADLs: Minimal assistance;Min guard;Cueing for safety;Rolling walker       Vision Baseline Vision/History: Wears glasses Wears Glasses: Reading only Patient Visual Report: No change from baseline       Perception     Praxis      Pertinent Vitals/Pain Pain Assessment: Faces Faces Pain Scale: Hurts little more Pain Location: R hip Pain Descriptors / Indicators: Guarding;Grimacing;Burning Pain Intervention(s): Monitored during session;Premedicated before session;Repositioned     Hand Dominance Right   Extremity/Trunk Assessment Upper Extremity Assessment Upper Extremity Assessment: Overall WFL for tasks assessed   Lower Extremity Assessment Lower Extremity Assessment: Defer to PT evaluation       Communication Communication Communication: HOH   Cognition Arousal/Alertness: Awake/alert Behavior During Therapy: WFL for tasks assessed/performed Overall Cognitive Status: Within Functional Limits for tasks assessed  General Comments       Exercises     Shoulder Instructions      Home Living Family/patient expects to be discharged to:: Private residence Living Arrangements: Alone Available Help at Discharge: Family;Friend(s);Available PRN/intermittently Type of Home: House Home  Access: Stairs to enter Entrance Stairs-Number of Steps: 1   Home Layout: Two level;Able to live on main level with bedroom/bathroom Alternate Level Stairs-Number of Steps: flight - lives on the first floor   Bathroom Shower/Tub: Tub/shower unit;Walk-in shower         Home Equipment: Gilford Rile - 2 wheels;Cane - single point          Prior Functioning/Environment Level of Independence: Independent with assistive device(s)        Comments: ambulates with cane and states that he was independent with ADLs/selfcare and home mgt        OT Problem List: Impaired balance (sitting and/or standing);Pain;Decreased activity tolerance;Decreased knowledge of use of DME or AE      OT Treatment/Interventions: Self-care/ADL training;DME and/or AE instruction;Therapeutic activities;Patient/family education    OT Goals(Current goals can be found in the care plan section) Acute Rehab OT Goals Patient Stated Goal: decrease pain OT Goal Formulation: With patient Time For Goal Achievement: 04/21/19 Potential to Achieve Goals: Good ADL Goals Pt Will Perform Grooming: with min guard assist;with supervision;with set-up;standing Pt Will Perform Upper Body Bathing: with set-up;sitting Pt Will Perform Lower Body Bathing: with min assist;sitting/lateral leans;sit to/from stand Pt Will Perform Upper Body Dressing: with set-up;sitting Pt Will Transfer to Toilet: with min guard assist;with supervision;ambulating Pt Will Perform Toileting - Clothing Manipulation and hygiene: with min assist;with min guard assist;sit to/from stand  OT Frequency: Min 2X/week   Barriers to D/C: Decreased caregiver support          Co-evaluation              AM-PAC OT "6 Clicks" Daily Activity     Outcome Measure Help from another person eating meals?: None Help from another person taking care of personal grooming?: A Little Help from another person toileting, which includes using toliet, bedpan, or urinal?: A  Lot Help from another person bathing (including washing, rinsing, drying)?: A Lot Help from another person to put on and taking off regular upper body clothing?: A Little Help from another person to put on and taking off regular lower body clothing?: Total 6 Click Score: 15   End of Session Equipment Utilized During Treatment: Gait belt;Rolling walker;Other (comment)(3 in 1)  Activity Tolerance: Patient tolerated treatment well Patient left: in bed;with call bell/phone within reach  OT Visit Diagnosis: Unsteadiness on feet (R26.81);Other abnormalities of gait and mobility (R26.89);Muscle weakness (generalized) (M62.81);Pain Pain - Right/Left: Right Pain - part of body: Hip;Leg                Time: [id number] OT Time Calculation (min): 36 min Charges:  OT General Charges $OT Visit: 1 Visit OT Evaluation $OT Eval Moderate Complexity: 1 Mod OT Treatments $Self Care/Home Management : 8-22 mins    Britt Bottom 04/07/2019, 2:53 PM

## 2019-04-08 LAB — SARS CORONAVIRUS 2 (TAT 6-24 HRS): SARS Coronavirus 2: NEGATIVE

## 2019-04-08 MED ORDER — ASPIRIN 81 MG PO CHEW: 81.0000 mg | CHEWABLE_TABLET | Freq: Two times a day (BID) | ORAL | 0 refills | Status: DC

## 2019-04-08 MED ORDER — METHOCARBAMOL 500 MG PO TABS: 500.0000 mg | ORAL_TABLET | Freq: Four times a day (QID) | ORAL | 0 refills | Status: DC | PRN

## 2019-04-08 MED ORDER — OXYCODONE HCL 5 MG PO TABS: 5.0000 mg | ORAL_TABLET | ORAL | 0 refills | Status: DC | PRN

## 2019-04-08 NOTE — Progress Notes (Signed)
Subjective: 2 Days Post-Op Procedure(s) (LRB): RIGHT TOTAL HIP ARTHROPLASTY ANTERIOR APPROACH (Right) Patient reports pain as moderate.  Denies chest pain or SOB.  Appears comfortable overall.  Vitals stable.  Therapy has recommended short-term skilled nursing placement for continued rehab since patient lives alone and is a fall risk.  Objective: Vital signs in last 24 hours: Temp:  [99.5 F (37.5 C)-100 F (37.8 C)] 100 F (37.8 C) (10/29 0723) Pulse Rate:  [80-85] 84 (10/29 0723) Resp:  [14-18] 14 (10/29 0723) BP: (112-141)/(51-70) 112/68 (10/29 0723) SpO2:  [94 %-100 %] 98 % (10/29 0723) FiO2 (%):  [96 %] 96 % (10/29 0449)  Intake/Output from previous day: 10/28 0701 - 10/29 0700 In: 720 [P.O.:720] Out: 1600 [Urine:1600] Intake/Output this shift: No intake/output data recorded.  Recent Labs    04/07/19 0326  HGB 12.3*   Recent Labs    04/07/19 0326  WBC 11.2*  RBC 4.03*  HCT 36.1*  PLT 227   Recent Labs    04/07/19 0326  NA 132*  K 3.8  CL 102  CO2 23  BUN 12  CREATININE 1.00  GLUCOSE 131*  CALCIUM 8.6*   No results for input(s): LABPT, INR in the last 72 hours.  Sensation intact distally Intact pulses distally Dorsiflexion/Plantar flexion intact Incision: scant drainage   Assessment/Plan: 2 Days Post-Op Procedure(s) (LRB): RIGHT TOTAL HIP ARTHROPLASTY ANTERIOR APPROACH (Right) Up with therapy Plan for discharge tomorrow Discharge to SNF      Mcarthur Rossetti 04/08/2019, 8:31 AM

## 2019-04-08 NOTE — Progress Notes (Signed)
Physical Therapy Treatment Patient Details Name: Steve Riley MRN: [id number] DOB: 1951/04/26 Today's Date: 04/08/2019    History of Present Illness Pt is a 68 y/o male s/p R THA, direct anterior approach secondary to avascular necrosis. PMH including but not limited to COPD.    PT Comments    Pt continuing to make steady progress with functional mobility. He ambulated a further distance this session with no LOB and min guard for safety with use of RW. Continuing to recommend SNF for short-term rehab as pt lives alone with no solid support from family/friends. Pt would continue to benefit from skilled physical therapy services at this time while admitted and after d/c to address the below listed limitations in order to improve overall safety and independence with functional mobility.    Follow Up Recommendations  SNF;Supervision/Assistance - 24 hour;Other (comment)(for short-term rehab prior to returning home alone)     Equipment Recommendations  None recommended by PT    Recommendations for Other Services       Precautions / Restrictions Precautions Precautions: Fall Restrictions Weight Bearing Restrictions: Yes RLE Weight Bearing: Weight bearing as tolerated    Mobility  Bed Mobility Overal bed mobility: Needs Assistance Bed Mobility: Supine to Sit     Supine to sit: Min guard     General bed mobility comments: min guard for safety; pt achieving upright sitting at EOB towards L side  Transfers Overall transfer level: Needs assistance Equipment used: Rolling walker (2 wheeled) Transfers: Sit to/from Stand Sit to Stand: Min guard         General transfer comment: cueing for safe hand placement, min guard with safety during transitional movement  Ambulation/Gait Ambulation/Gait assistance: Min guard Gait Distance (Feet): 60 Feet Assistive device: Rolling walker (2 wheeled) Gait Pattern/deviations: Decreased stride length;Decreased step length -  right;Decreased step length - left;Step-to pattern;Antalgic Gait velocity: decreased   General Gait Details: pt with slow, steady gait using a step-to pattern, mildly antalgic; no LOB or need for physical assistance   Stairs             Wheelchair Mobility    Modified Rankin (Stroke Patients Only)       Balance Overall balance assessment: Needs assistance Sitting-balance support: Feet supported Sitting balance-Leahy Scale: Fair     Standing balance support: Bilateral upper extremity supported;During functional activity Standing balance-Leahy Scale: Poor                              Cognition Arousal/Alertness: Awake/alert Behavior During Therapy: WFL for tasks assessed/performed Overall Cognitive Status: Within Functional Limits for tasks assessed                                        Exercises Total Joint Exercises Ankle Circles/Pumps: AROM;Both;20 reps;Seated Long Arc Quad: AROM;Both;10 reps;Seated Marching in Standing: Seated;AROM;Strengthening;Both;10 reps    General Comments        Pertinent Vitals/Pain Pain Assessment: Faces Faces Pain Scale: Hurts a little bit Pain Location: R hip Pain Descriptors / Indicators: Burning Pain Intervention(s): Monitored during session;Repositioned    Home Living                      Prior Function            PT Goals (current goals can now be found in the care  plan section) Acute Rehab PT Goals PT Goal Formulation: With patient Time For Goal Achievement: 04/21/19 Potential to Achieve Goals: Good Progress towards PT goals: Progressing toward goals    Frequency    7X/week      PT Plan Current plan remains appropriate    Co-evaluation              AM-PAC PT "6 Clicks" Mobility   Outcome Measure  Help needed turning from your back to your side while in a flat bed without using bedrails?: A Little Help needed moving from lying on your back to sitting on the  side of a flat bed without using bedrails?: A Little Help needed moving to and from a bed to a chair (including a wheelchair)?: A Little Help needed standing up from a chair using your arms (e.g., wheelchair or bedside chair)?: A Little Help needed to walk in hospital room?: A Little Help needed climbing 3-5 steps with a railing? : A Lot 6 Click Score: 17    End of Session Equipment Utilized During Treatment: Gait belt Activity Tolerance: Patient tolerated treatment well Patient left: in chair;with call bell/phone within reach;with chair alarm set Nurse Communication: Mobility status PT Visit Diagnosis: Other abnormalities of gait and mobility (R26.89);Pain Pain - Right/Left: Right Pain - part of body: Hip     Time: [id number] PT Time Calculation (min) (ACUTE ONLY): 18 min  Charges:  $Gait Training: 8-22 mins                     Anastasio Champion, DPT  Acute Rehabilitation Services Pager [phone] Office [place] 04/08/2019, 4:39 PM

## 2019-04-08 NOTE — Discharge Instructions (Signed)

## 2019-04-08 NOTE — Progress Notes (Signed)
Physical Therapy Treatment Patient Details Name: Steve Riley MRN: [id number] DOB: 24-Aug-1950 Today's Date: 04/08/2019    History of Present Illness Pt is a 68 y/o male s/p R THA, direct anterior approach secondary to avascular necrosis. PMH including but not limited to COPD.    PT Comments    Pt with improved tolerance and control of RR/breathing pattern this session with activity. Mild gasping at end after ambulation; however, much improved since previous session. Pt on RA throughout with SPO2 maintaining >92%. Pt also progressing to short distance gait training with min guard and use of RW. Continue to recommend pt d/c to SNF for short-term rehab prior to returning home alone. Pt would continue to benefit from skilled physical therapy services at this time while admitted and after d/c to address the below listed limitations in order to improve overall safety and independence with functional mobility.    Follow Up Recommendations  SNF;Supervision/Assistance - 24 hour     Equipment Recommendations  None recommended by PT    Recommendations for Other Services       Precautions / Restrictions Precautions Precautions: Fall Restrictions Weight Bearing Restrictions: Yes RLE Weight Bearing: Weight bearing as tolerated    Mobility  Bed Mobility Overal bed mobility: Needs Assistance Bed Mobility: Supine to Sit;Sit to Supine     Supine to sit: Min guard     General bed mobility comments: min guard for safety; pt achieving upright sitting at EOB towards L side  Transfers Overall transfer level: Needs assistance Equipment used: Rolling walker (2 wheeled) Transfers: Sit to/from Stand Sit to Stand: Min guard         General transfer comment: cueing for safe hand placement, min guard with safety during transitional movement  Ambulation/Gait Ambulation/Gait assistance: Min guard Gait Distance (Feet): 20 Feet Assistive device: Rolling walker (2 wheeled) Gait  Pattern/deviations: Decreased stride length;Decreased step length - right;Decreased step length - left;Step-to pattern;Antalgic Gait velocity: decreased   General Gait Details: pt with slow, steady gait using a step-to pattern, mildly antalgic; much better control of RR   Stairs             Wheelchair Mobility    Modified Rankin (Stroke Patients Only)       Balance Overall balance assessment: Needs assistance Sitting-balance support: Feet supported Sitting balance-Leahy Scale: Fair     Standing balance support: Bilateral upper extremity supported;During functional activity Standing balance-Leahy Scale: Poor                              Cognition Arousal/Alertness: Awake/alert Behavior During Therapy: WFL for tasks assessed/performed Overall Cognitive Status: Within Functional Limits for tasks assessed                                        Exercises      General Comments        Pertinent Vitals/Pain Pain Assessment: Faces Faces Pain Scale: Hurts a little bit Pain Location: R hip Pain Descriptors / Indicators: Burning Pain Intervention(s): Monitored during session    Home Living                      Prior Function            PT Goals (current goals can now be found in the care plan section) Acute Rehab PT  Goals PT Goal Formulation: With patient Time For Goal Achievement: 04/21/19 Potential to Achieve Goals: Good Progress towards PT goals: Progressing toward goals    Frequency    7X/week      PT Plan Current plan remains appropriate    Co-evaluation              AM-PAC PT "6 Clicks" Mobility   Outcome Measure  Help needed turning from your back to your side while in a flat bed without using bedrails?: A Little Help needed moving from lying on your back to sitting on the side of a flat bed without using bedrails?: A Little Help needed moving to and from a bed to a chair (including a wheelchair)?:  A Little Help needed standing up from a chair using your arms (e.g., wheelchair or bedside chair)?: A Little Help needed to walk in hospital room?: A Lot Help needed climbing 3-5 steps with a railing? : A Lot 6 Click Score: 16    End of Session Equipment Utilized During Treatment: Gait belt Activity Tolerance: Patient limited by pain Patient left: in chair;with call bell/phone within reach;with chair alarm set Nurse Communication: Mobility status PT Visit Diagnosis: Other abnormalities of gait and mobility (R26.89);Pain Pain - Right/Left: Right Pain - part of body: Hip     Time: [id number] PT Time Calculation (min) (ACUTE ONLY): 20 min  Charges:  $Gait Training: 8-22 mins                     Sherie Don, [place], DPT  Acute Rehabilitation Services Pager [phone] Office [place] 04/08/2019, 9:27 AM

## 2019-04-09 NOTE — TOC Progression Note (Signed)
Transition of Care [facility]) - Progression Note    Patient Details  Name: Steve Riley MRN: [id number] Date of Birth: March 26, 1951  Transition of Care [facility]) CM/SW [place], LCSW Phone Number: 04/09/2019, 11:40 AM  Clinical Narrative:    Bed offered and accepted at [facility]. CSW spoke with [facility] admissions coordinator who states that she will start insurance authorization for Monday 11/2. Patient will need an updated COVID test within 24-48 hours of discharge. Dispo is pending insurance auth and negative covid test.    Expected Discharge Plan: [place] Barriers to Discharge: Continued Medical Work up, Ship broker  Expected Discharge Plan and Services Expected Discharge Plan: [place] In-house Referral: Clinical Social Work   Post Acute Care Choice: [place]   Expected Discharge Date: 04/09/19                                     Social Determinants of Health (SDOH) Interventions    Readmission Risk Interventions No flowsheet data found.

## 2019-04-09 NOTE — Progress Notes (Signed)
Physical Therapy Treatment Patient Details Name: Steve Riley MRN: [id number] DOB: [birth year] Today's Date: 04/09/2019    History of Present Illness Pt is a 68 y/o male s/p R THA, direct anterior approach secondary to avascular necrosis. PMH including but not limited to COPD.    PT Comments    Pt continuing to make steady progress with mobility. Plan is for pt to d/c to SNF for short-term rehab. Discussed this with pt to explain the need as he was not overly fond of the idea. However, once PT further explained he was agreeable and understanding. Pt would continue to benefit from skilled physical therapy services at this time while admitted and after d/c to address the below listed limitations in order to improve overall safety and independence with functional mobility.  Pt on RA throughout with SPO2 maintaining >93%.    Follow Up Recommendations  SNF;Supervision/Assistance - 24 hour     Equipment Recommendations  None recommended by PT    Recommendations for Other Services       Precautions / Restrictions Precautions Precautions: Fall Restrictions Weight Bearing Restrictions: Yes RLE Weight Bearing: Weight bearing as tolerated    Mobility  Bed Mobility Overal bed mobility: Needs Assistance Bed Mobility: Supine to Sit     Supine to sit: Min guard     General bed mobility comments: min guard for safety; pt achieving upright sitting at EOB towards his R side  Transfers Overall transfer level: Needs assistance Equipment used: Rolling walker (2 wheeled) Transfers: Sit to/from Stand Sit to Stand: Min guard         General transfer comment: cueing for safe hand placement, min guard with safety during transitional movement  Ambulation/Gait Ambulation/Gait assistance: Min guard Gait Distance (Feet): 40 Feet Assistive device: Rolling walker (2 wheeled) Gait Pattern/deviations: Decreased stride length;Decreased step length - right;Decreased step length - left;Step-to  pattern;Antalgic Gait velocity: decreased   General Gait Details: pt with slow, steady gait using a step-to pattern, mildly antalgic; no LOB or need for physical assistance   Stairs             Wheelchair Mobility    Modified Rankin (Stroke Patients Only)       Balance Overall balance assessment: Needs assistance Sitting-balance support: Feet supported Sitting balance-Leahy Scale: Good     Standing balance support: Bilateral upper extremity supported;During functional activity Standing balance-Leahy Scale: Poor                              Cognition Arousal/Alertness: Awake/alert Behavior During Therapy: WFL for tasks assessed/performed Overall Cognitive Status: Within Functional Limits for tasks assessed                                        Exercises Total Joint Exercises Quad Sets: AROM;Strengthening;Right;10 reps;Supine Heel Slides: AROM;Strengthening;Right;10 reps;Supine Hip ABduction/ADduction: AROM;Strengthening;Right;10 reps;Supine Straight Leg Raises: AROM;Strengthening;Right;10 reps;Supine    General Comments        Pertinent Vitals/Pain Pain Assessment: Faces Faces Pain Scale: Hurts little more Pain Location: R hip Pain Descriptors / Indicators: Burning Pain Intervention(s): Monitored during session;Repositioned    Home Living                      Prior Function            PT Goals (current goals can now  be found in the care plan section) Acute Rehab PT Goals PT Goal Formulation: With patient Time For Goal Achievement: 04/21/19 Potential to Achieve Goals: Good Progress towards PT goals: Progressing toward goals    Frequency    7X/week      PT Plan Current plan remains appropriate    Co-evaluation              AM-PAC PT "6 Clicks" Mobility   Outcome Measure  Help needed turning from your back to your side while in a flat bed without using bedrails?: None Help needed moving from  lying on your back to sitting on the side of a flat bed without using bedrails?: None Help needed moving to and from a bed to a chair (including a wheelchair)?: A Little Help needed standing up from a chair using your arms (e.g., wheelchair or bedside chair)?: A Little Help needed to walk in hospital room?: A Little Help needed climbing 3-5 steps with a railing? : A Lot 6 Click Score: 19    End of Session Equipment Utilized During Treatment: Gait belt Activity Tolerance: Patient tolerated treatment well Patient left: in chair;with call bell/phone within reach;with chair alarm set Nurse Communication: Mobility status PT Visit Diagnosis: Other abnormalities of gait and mobility (R26.89);Pain Pain - Right/Left: Right Pain - part of body: Hip     Time: [id number] PT Time Calculation (min) (ACUTE ONLY): 20 min  Charges:  $Gait Training: 8-22 mins                     Anastasio Champion, DPT  Acute Rehabilitation Services Pager [phone] Office [place] 04/09/2019, 9:24 AM

## 2019-04-09 NOTE — Discharge Summary (Signed)
Patient ID: Steve Riley MRN: [id number] DOB/AGE: [birth year] 68 y.o.  Admit date: 04/06/2019 Discharge date: 04/09/2019  Admission Diagnoses:  Principal Problem:   Avascular necrosis of bone of right hip [facility]) Active Problems:   Status post total replacement of right hip   Discharge Diagnoses:  Same  Past Medical History:  Diagnosis Date  . COPD (chronic obstructive pulmonary disease) ([place])   . Pneumonia   . Prostate cancer [facility])     Surgeries: Procedure(s): RIGHT TOTAL HIP ARTHROPLASTY ANTERIOR APPROACH on 04/06/2019   Consultants:   Discharged Condition: Improved  Hospital Course: Steve Riley is an 68 y.o. male who was admitted 04/06/2019 for operative treatment ofAvascular necrosis of bone of right hip ([place]). Patient has severe unremitting pain that affects sleep, daily activities, and work/hobbies. After pre-op clearance the patient was taken to the operating room on 04/06/2019 and underwent  Procedure(s): RIGHT TOTAL HIP ARTHROPLASTY ANTERIOR APPROACH.    Patient was given perioperative antibiotics:  Anti-infectives (From admission, onward)   Start     Dose/Rate Route Frequency Ordered Stop   04/06/19 2000  clindamycin (CLEOCIN) IVPB 600 mg     600 mg 100 mL/hr over 30 Minutes Intravenous Every 6 hours 04/06/19 1603 04/07/19 0240   04/06/19 1130  clindamycin (CLEOCIN) IVPB 900 mg     900 mg 100 mL/hr over 30 Minutes Intravenous On call to O.R. 04/06/19 1127 04/06/19 1232   04/06/19 1054  clindamycin (CLEOCIN) 900 MG/50ML IVPB    Note to Pharmacy: Carolynn Comment, [name]   : cabinet override      04/06/19 1054 04/06/19 1232       Patient was given sequential compression devices, early ambulation, and chemoprophylaxis to prevent DVT.  Patient benefited maximally from hospital stay and there were no complications.    Recent vital signs:  Patient Vitals for the past 24 hrs:  BP Temp Temp src Pulse Resp SpO2  04/09/19 0308 132/83 100.2 F (37.9 C) Oral 79 - 97 %   04/08/19 2118 - - - - - 98 %  04/08/19 2017 114/67 100.3 F (37.9 C) Oral 82 17 96 %  04/08/19 1613 (!) 118/56 99.1 F (37.3 C) Oral 82 14 96 %  04/08/19 0843 - - - 89 16 92 %  04/08/19 0723 112/68 100 F (37.8 C) Oral 84 14 98 %     Recent laboratory studies:  Recent Labs    04/07/19 0326  WBC 11.2*  HGB 12.3*  HCT 36.1*  PLT 227  NA 132*  K 3.8  CL 102  CO2 23  BUN 12  CREATININE 1.00  GLUCOSE 131*  CALCIUM 8.6*     Discharge Medications:   Allergies as of 04/09/2019      Reactions   Penicillin G Other (See Comments)   "passed out"/childhood allergy Did it involve swelling of the face/tongue/throat, SOB, or low BP? No Did it involve sudden or severe rash/hives, skin peeling, or any reaction on the inside of your mouth or nose? No Did you need to seek medical attention at a hospital or doctor's office? No When did it last happen?childhood allergy If all above answers are "NO", may proceed with cephalosporin use.      Medication List    TAKE these medications   albuterol 108 (90 Base) MCG/ACT inhaler Commonly known as: VENTOLIN HFA Inhale 2 puffs into the lungs every 6 (six) hours as needed for wheezing.   aspirin 81 MG chewable tablet Chew 1 tablet (81 mg total)  by mouth 2 (two) times daily.   Calcium 600/Vitamin D 600-400 MG-UNIT chew tablet Generic drug: Calcium Carbonate-Vitamin D Chew 1 tablet by mouth daily.   celecoxib 100 MG capsule Commonly known as: CELEBREX Take 100 mg by mouth daily.   Centrum Silver tablet Take 1 tablet by mouth daily.   Flaxseed Oil 1200 MG Caps Take 1,200 mg by mouth daily.   Flurandrenolide 4 MCG/SQCM Tape Apply 4 each topically daily.   GLUCOMANNAN KONJAC ROOT PO Take 1 tablet by mouth daily as needed (bowel regularity).   HM Super Vitamin B Complex/C Tabs Take by mouth.   magnesium oxide 400 MG tablet Commonly known as: MAG-OX Take 400 mg by mouth daily.   methocarbamol 500 MG tablet Commonly  known as: ROBAXIN Take 1 tablet (500 mg total) by mouth every 6 (six) hours as needed for muscle spasms.   MSM 1000 MG Caps Take 1,000 mg by mouth 2 (two) times daily.   Omega-3 1000 MG Caps Take 1 g by mouth 2 (two) times daily.   OVER THE COUNTER MEDICATION Take 4,000 Units by mouth daily. Serrapeptase   oxyCODONE 5 MG immediate release tablet Commonly known as: Oxy IR/ROXICODONE Take 1-2 tablets (5-10 mg total) by mouth every 4 (four) hours as needed for moderate pain (pain score 4-6).   Symbicort 80-4.5 MCG/ACT inhaler Generic drug: budesonide-formoterol Inhale 2 puffs into the lungs 2 (two) times daily.   traZODone 100 MG tablet Commonly known as: DESYREL Take 100 mg by mouth at bedtime.   Turmeric Curcumin 500 MG Caps Take 500 mg by mouth daily.   vitamin C with rose hips 1000 MG tablet Take 1,000 mg by mouth daily.   Zinc 50 MG Tabs Take 50 mg by mouth daily.            Durable Medical Equipment  (From admission, onward)         Start     Ordered   04/06/19 1603  DME 3 n 1  Once     04/06/19 1603   04/06/19 1603  DME Walker rolling  Once    Question:  Patient needs a walker to treat with the following condition  Answer:  Status post total replacement of right hip   04/06/19 1603          Diagnostic Studies: Dg Pelvis Portable  Result Date: 04/06/2019 CLINICAL DATA:  Right hip arthroplasty. EXAM: DG C-ARM 1-60 MIN; OPERATIVE RIGHT HIP WITH PELVIS; PORTABLE PELVIS 1-2 VIEWS CONTRAST:  None. FLUOROSCOPY TIME:  Fluoroscopy Time:  0 minutes 31 seconds. Number of Acquired Spot Images: 7 COMPARISON:  02/22/2019. FINDINGS: Total right hip replacement.  Hardware intact.  Anatomic alignment. IMPRESSION: Total right hip replacement anatomic alignment. Electronically Signed   By: Marcello Moores  Register   On: 04/06/2019 15:16   Dg Chest Port 1 View  Result Date: 04/07/2019 CLINICAL DATA:  Shortness of breath EXAM: PORTABLE CHEST 1 VIEW COMPARISON:  03/06/2018  FINDINGS: Heart and mediastinal contours are within normal limits. No focal opacities or effusions. No acute bony abnormality. IMPRESSION: No active disease. Electronically Signed   By: Rolm Baptise M.D.   On: 04/07/2019 12:00   Dg C-arm 1-60 Min  Result Date: 04/06/2019 CLINICAL DATA:  Right hip arthroplasty. EXAM: DG C-ARM 1-60 MIN; OPERATIVE RIGHT HIP WITH PELVIS; PORTABLE PELVIS 1-2 VIEWS CONTRAST:  None. FLUOROSCOPY TIME:  Fluoroscopy Time:  0 minutes 31 seconds. Number of Acquired Spot Images: 7 COMPARISON:  02/22/2019. FINDINGS: Total right hip replacement.  Hardware intact.  Anatomic alignment. IMPRESSION: Total right hip replacement anatomic alignment. Electronically Signed   By: Marcello Moores  Register   On: 04/06/2019 15:16   Dg Hip Operative Unilat With Pelvis Right  Result Date: 04/06/2019 CLINICAL DATA:  Right hip arthroplasty. EXAM: DG C-ARM 1-60 MIN; OPERATIVE RIGHT HIP WITH PELVIS; PORTABLE PELVIS 1-2 VIEWS CONTRAST:  None. FLUOROSCOPY TIME:  Fluoroscopy Time:  0 minutes 31 seconds. Number of Acquired Spot Images: 7 COMPARISON:  02/22/2019. FINDINGS: Total right hip replacement.  Hardware intact.  Anatomic alignment. IMPRESSION: Total right hip replacement anatomic alignment. Electronically Signed   By: [place]   On: 04/06/2019 15:16    Disposition: Discharge disposition: 03-Skilled [place]    Mcarthur Rossetti, MD Follow up in 2 week(s).   Specialty: Orthopedic Surgery Why: Our new address is  [address] This is next door to our old Teacher, adult education information: [place] [address] [phone]            Signed: Mcarthur Rossetti 04/09/2019, 6:38 AM

## 2019-04-09 NOTE — Plan of Care (Signed)
  Problem: Activity: Goal: Risk for activity intolerance will decrease Outcome: Progressing   Problem: Elimination: Goal: Will not experience complications related to bowel motility Outcome: Progressing   Problem: Pain Managment: Goal: General experience of comfort will improve Outcome: Progressing   Problem: Safety: Goal: Ability to remain free from injury will improve Outcome: Progressing   Problem: Clinical Measurements: Goal: Ability to maintain clinical measurements within normal limits will improve Outcome: Progressing

## 2019-04-09 NOTE — Progress Notes (Signed)
Patient ID: Steve Riley, male   DOB: [birth year], 68 y.o.   MRN: [id number] No acute changes.  Vitals stable.  Right hip stable.  Can be discharged to short-term skilled nursing today.

## 2019-04-09 NOTE — Progress Notes (Signed)
CSW sent information to [facility] on 10/29 to start auth; navi responded and stated this is not an [place] that they complete. CSW informed facility to complete auth. Auth started today 10/30

## 2019-04-10 NOTE — Progress Notes (Signed)
Patient ID: Steve Riley, male   DOB: [birth year], 68 y.o.   MRN: [id number] No acute changes.  Vitals stable. Right hip stable.  The only thing that is keeping him in the hospital to placement to short-term skilled nursing.  Can be discharged when that is established.  Likely discharge Monday.

## 2019-04-10 NOTE — Progress Notes (Addendum)
Occupational Therapy Treatment Patient Details Name: XIANG REUM MRN: [id number] DOB: [birth year] Today's Date: 04/10/2019    History of present illness Pt is a 68 y/o male s/p R THA, direct anterior approach secondary to avascular necrosis. PMH including but not limited to COPD.   OT comments  Pt making steady progress towards OT goals this session. Session focus on standing grooming at sink and functional mobility. Pt complete UB/LB bathing at sink with supervision. Pt able to stand ~ 6 minutes from 3n1 before reporting SOB needing to sit down. Pt on RA, O2 checked 93%. Pt complete functional mobility min guard with RW in hallway. Pt reports trouble breathing with mask on needing to take a standing rest break, O2 sats dropped to 88%, rebounded to 90s with PLB technique. DC plan remains appropriate, will continue to follow acutely per POC.      Follow Up Recommendations  SNF;Other (comment)(may be able to have [place] if progressing well)    Equipment Recommendations  3 in 1 bedside commode;Tub/shower seat;Other (comment)    Recommendations for Other Services      Precautions / Restrictions Precautions Precautions: Fall Restrictions Weight Bearing Restrictions: Yes RLE Weight Bearing: Weight bearing as tolerated       Mobility Bed Mobility Overal bed mobility: Needs Assistance Bed Mobility: Supine to Sit     Supine to sit: Supervision;HOB elevated     General bed mobility comments: no physical assist, use of bed features  Transfers Overall transfer level: Needs assistance Equipment used: Rolling walker (2 wheeled) Transfers: Sit to/from Stand Sit to Stand: Min guard         General transfer comment: cueing for safe hand placement    Balance Overall balance assessment: Needs assistance Sitting-balance support: Feet supported Sitting balance-Leahy Scale: Good Sitting balance - Comments: able to reach feet from sitting EOB   Standing balance support: During  functional activity Standing balance-Leahy Scale: Fair                             ADL either performed or assessed with clinical judgement   ADL Overall ADL's : Needs assistance/impaired     Grooming: Wash/dry hands;Wash/dry face;Set up;Supervision/safety;Standing   Upper Body Bathing: Set up;Supervision/ safety;Sitting   Lower Body Bathing: Supervison/ safety;Set up;Sit to/from stand   Upper Body Dressing : Minimal assistance;Sitting Upper Body Dressing Details (indicate cue type and reason): hospital gown Lower Body Dressing: Supervision/safety;Sitting/lateral leans Lower Body Dressing Details (indicate cue type and reason): able to access feet from EOB Toilet Transfer: Min guard;RW;Ambulation Toilet Transfer Details (indicate cue type and reason): cues for hand placment         Functional mobility during ADLs: Minimal assistance;Rolling walker General ADL Comments: supervison- set up assist for standing grooming at sink, pt tolerated standing ~ 6 minutes before needing seated rest break, on RA during ADLs, reports SOB after 6 minute stand, education on PLB, completed short functional mobility in hallway with min guard and RW, pt reports can't breathe well with mask on, O2 dropped to 88 but increased to 90s with PLB and standing rest break     Vision Baseline Vision/History: Wears glasses Wears Glasses: Reading only Patient Visual Report: No change from baseline     Perception     Praxis      Cognition Arousal/Alertness: Awake/alert Behavior During Therapy: WFL for tasks assessed/performed Overall Cognitive Status: Within Functional Limits for tasks assessed  Exercises     Shoulder Instructions       General Comments on RA throughout session O2 dropped to 88% during functional mobility with mask on in hallway, rebounded to 90s with PLB technique and standing rest break    Pertinent Vitals/  Pain       Pain Assessment: Faces Faces Pain Scale: Hurts a little bit Pain Location: R hip Pain Descriptors / Indicators: Burning Pain Intervention(s): Monitored during session;Limited activity within patient's tolerance;Repositioned  Home Living                                          Prior Functioning/Environment              Frequency  Min 2X/week        Progress Toward Goals  OT Goals(current goals can now be found in the care plan section)  Progress towards OT goals: Progressing toward goals  Acute Rehab OT Goals Patient Stated Goal: decrease pain OT Goal Formulation: With patient Time For Goal Achievement: 04/21/19 Potential to Achieve Goals: Good  Plan Discharge plan remains appropriate    Co-evaluation                 AM-PAC OT "6 Clicks" Daily Activity     Outcome Measure   Help from another person eating meals?: None Help from another person taking care of personal grooming?: A Little Help from another person toileting, which includes using toliet, bedpan, or urinal?: A Little Help from another person bathing (including washing, rinsing, drying)?: A Little Help from another person to put on and taking off regular upper body clothing?: A Little Help from another person to put on and taking off regular lower body clothing?: A Little 6 Click Score: 19    End of Session Equipment Utilized During Treatment: Gait belt;Rolling walker;Other (comment)(3n1 during grooming at sink)  OT Visit Diagnosis: Unsteadiness on feet (R26.81);Other abnormalities of gait and mobility (R26.89);Muscle weakness (generalized) (M62.81);Pain Pain - Right/Left: Right Pain - part of body: Hip;Leg   Activity Tolerance Patient tolerated treatment well   Patient Left in chair;with call bell/phone within reach;with chair alarm set   Nurse Communication Mobility status;Other (comment)(left pt on RA)        Time: [id number] OT Time Calculation (min): 31  min  Charges: OT General Charges $OT Visit: 1 Visit OT Treatments $Self Care/Home Management : 23-37 mins  Lanier Clam., COTA/L Acute Rehabilitation Services [phone] [place] 04/10/2019, 1:01 PM

## 2019-04-10 NOTE — Progress Notes (Signed)
Physical Therapy Treatment Patient Details Name: Steve Riley MRN: [id number] DOB: Jul 31, 1950 Today's Date: 04/10/2019    History of Present Illness Pt is a 68 y/o male s/p R THA, direct anterior approach secondary to avascular necrosis. PMH including but not limited to COPD.    PT Comments    Spoke with pt about d/c plans. He seemed irritated and fearful about going to SNF. Discussed with pt the need for supervision for safety after d/c as he lives alone. He may refuse SNF. Pt ambulated in hallway with min guard for safety. He is overall steady with RW. Distance limited secondary to SOB. Reviewed standing HEP with pt. Plan to follow acutely for mobility progression and activity tolerance.     Follow Up Recommendations  SNF;Supervision/Assistance - 24 hour     Equipment Recommendations  None recommended by PT    Recommendations for Other Services       Precautions / Restrictions Precautions Precautions: Fall Precaution Comments: HOH Restrictions Weight Bearing Restrictions: Yes RLE Weight Bearing: Weight bearing as tolerated    Mobility  Bed Mobility Overal bed mobility: Needs Assistance Bed Mobility: Supine to Sit     Supine to sit: Supervision;HOB elevated     General bed mobility comments: in chair on arrival  Transfers Overall transfer level: Needs assistance Equipment used: Rolling walker (2 wheeled) Transfers: Sit to/from Stand Sit to Stand: Supervision         General transfer comment: supervision for safety  Ambulation/Gait Ambulation/Gait assistance: Min guard Gait Distance (Feet): 75 Feet Assistive device: Rolling walker (2 wheeled) Gait Pattern/deviations: Decreased stride length;Decreased step length - right;Decreased step length - left;Antalgic;Step-through pattern Gait velocity: decreased   General Gait Details: Pt with slow mildly antlagic gait. Distance limited secondary to SOB. SpO2 at 90% on RA. No LOB noted, pt overall steady with  RW   Stairs             Wheelchair Mobility    Modified Rankin (Stroke Patients Only)       Balance Overall balance assessment: Needs assistance Sitting-balance support: Feet supported Sitting balance-Leahy Scale: Good Sitting balance - Comments: able to reach feet from sitting EOB   Standing balance support: During functional activity Standing balance-Leahy Scale: Fair                              Cognition Arousal/Alertness: Awake/alert Behavior During Therapy: WFL for tasks assessed/performed Overall Cognitive Status: Within Functional Limits for tasks assessed                                        Exercises Total Joint Exercises Hip ABduction/ADduction: AROM;Right;10 reps;Standing Long Arc Quad: AROM;Right;10 reps;Seated Knee Flexion: AROM;Right;10 reps;Standing Marching in Standing: AROM;Right;10 reps;Standing Standing Hip Extension: AROM;Right;10 reps;Standing    General Comments General comments (skin integrity, edema, etc.): on RA throughout session O2 dropped to 88% during functional mobility with mask on in hallway, rebounded to 90s with PLB technique and standing rest break      Pertinent Vitals/Pain Pain Assessment: Faces Faces Pain Scale: Hurts a little bit Pain Location: R hip Pain Descriptors / Indicators: Burning Pain Intervention(s): Monitored during session;Limited activity within patient's tolerance    Home Living                      Prior Function  PT Goals (current goals can now be found in the care plan section) Acute Rehab PT Goals Patient Stated Goal: decrease pain PT Goal Formulation: With patient Time For Goal Achievement: 04/21/19 Potential to Achieve Goals: Good Progress towards PT goals: Progressing toward goals    Frequency    7X/week      PT Plan Current plan remains appropriate    Co-evaluation              AM-PAC PT "6 Clicks" Mobility   Outcome  Measure  Help needed turning from your back to your side while in a flat bed without using bedrails?: None Help needed moving from lying on your back to sitting on the side of a flat bed without using bedrails?: None Help needed moving to and from a bed to a chair (including a wheelchair)?: A Little Help needed standing up from a chair using your arms (e.g., wheelchair or bedside chair)?: A Little Help needed to walk in hospital room?: A Little Help needed climbing 3-5 steps with a railing? : A Little 6 Click Score: 20    End of Session Equipment Utilized During Treatment: Gait belt Activity Tolerance: Patient tolerated treatment well Patient left: in chair;with call bell/phone within reach;with chair alarm set Nurse Communication: Mobility status PT Visit Diagnosis: Other abnormalities of gait and mobility (R26.89);Pain Pain - Right/Left: Right Pain - part of body: Hip     Time: [id number] PT Time Calculation (min) (ACUTE ONLY): 20 min  Charges:  $Gait Training: 8-22 mins                    Benjiman Core, [place] Pager [phone] Acute Rehab   Allena Katz 04/10/2019, 2:03 PM

## 2019-04-10 NOTE — Plan of Care (Signed)
  Problem: Clinical Measurements: Goal: Will remain free from infection Outcome: Progressing   Problem: Activity: Goal: Risk for activity intolerance will decrease Outcome: Progressing   Problem: Nutrition: Goal: Adequate nutrition will be maintained Outcome: Progressing   Problem: Elimination: Goal: Will not experience complications related to bowel motility Outcome: Progressing   Problem: Pain Managment: Goal: General experience of comfort will improve Outcome: Progressing   Problem: Safety: Goal: Ability to remain free from injury will improve Outcome: Progressing

## 2019-04-11 ENCOUNTER — Encounter (HOSPITAL_COMMUNITY): Payer: Self-pay | Admitting: *Deleted

## 2019-04-11 NOTE — Progress Notes (Signed)
Physical Therapy Treatment Patient Details Name: Steve Riley MRN: [id number] DOB: 12-12-1950 Today's Date: 04/11/2019    History of Present Illness Pt is a 68 y/o male s/p R THA, direct anterior approach secondary to avascular necrosis. PMH including but not limited to COPD.    PT Comments    Continuing work on functional mobility and activity tolerance;  Took some time to talk about managing at home, and Steve Riley did not mention anyone who can come help him at home; I remain concerned fo rIADLs   Follow Up Recommendations  SNF;Supervision/Assistance - 24 hour;Follow surgeon's recommendation for DC plan and follow-up therapies     Equipment Recommendations  3in1 (PT)    Recommendations for Other Services       Precautions / Restrictions Precautions Precautions: Fall Precaution Comments: HOH Restrictions RLE Weight Bearing: Weight bearing as tolerated    Mobility  Bed Mobility Overal bed mobility: Needs Assistance       Supine to sit: Supervision;HOB elevated     General bed mobility comments: slow moving, supervision for safeyt  Transfers Overall transfer level: Needs assistance Equipment used: Rolling walker (2 wheeled) Transfers: Sit to/from Stand Sit to Stand: Supervision         General transfer comment: supervision for safety  Ambulation/Gait Ambulation/Gait assistance: Min guard(with and without physical contact) Gait Distance (Feet): 120 Feet Assistive device: Rolling walker (2 wheeled) Gait Pattern/deviations: Decreased stride length;Decreased step length - right;Decreased step length - left;Antalgic;Step-through pattern Gait velocity: decreased   General Gait Details: Pt with slow mildly antlagic gait. Cues to stand tall on RLE to encourage quad and gluteal recruitment in stance; Distance improved, but still limited secondary to SOB. no LOB noted, pt overall steady with RW   Stairs             Wheelchair Mobility    Modified Rankin  (Stroke Patients Only)       Balance     Sitting balance-Leahy Scale: Good       Standing balance-Leahy Scale: Fair                              Cognition Arousal/Alertness: Awake/alert Behavior During Therapy: WFL for tasks assessed/performed Overall Cognitive Status: Within Functional Limits for tasks assessed                                        Exercises Total Joint Exercises Marching in Standing: AROM;Right;10 reps;Standing Standing Hip Extension: AROM;Right;10 reps;Standing Other Exercises Other Exercises: standing hip abduction x10    General Comments        Pertinent Vitals/Pain Pain Assessment: 0-10 Pain Score: 3  Faces Pain Scale: Hurts even more Pain Location: R hip Pain Descriptors / Indicators: Burning Pain Intervention(s): Monitored during session    Home Living                      Prior Function            PT Goals (current goals can now be found in the care plan section) Acute Rehab PT Goals Patient Stated Goal: decrease pain PT Goal Formulation: With patient Time For Goal Achievement: 04/21/19 Potential to Achieve Goals: Good Progress towards PT goals: Progressing toward goals    Frequency    7X/week      PT Plan Current plan remains  appropriate    Co-evaluation              AM-PAC PT "6 Clicks" Mobility   Outcome Measure  Help needed turning from your back to your side while in a flat bed without using bedrails?: None Help needed moving from lying on your back to sitting on the side of a flat bed without using bedrails?: None Help needed moving to and from a bed to a chair (including a wheelchair)?: A Little Help needed standing up from a chair using your arms (e.g., wheelchair or bedside chair)?: A Little Help needed to walk in hospital room?: A Little Help needed climbing 3-5 steps with a railing? : A Little 6 Click Score: 20    End of Session Equipment Utilized During  Treatment: Gait belt Activity Tolerance: Patient tolerated treatment well Patient left: with call bell/phone within reach;in chair Nurse Communication: Mobility status PT Visit Diagnosis: Other abnormalities of gait and mobility (R26.89);Pain Pain - Right/Left: Right Pain - part of body: Hip     Time: [id number] PT Time Calculation (min) (ACUTE ONLY): 17 min  Charges:  $Gait Training: 8-22 mins $Therapeutic Exercise: 8-22 mins                     Roney Marion, PT  Acute Rehabilitation Services Pager [phone] Office [place] 04/11/2019, 6:13 PM

## 2019-04-11 NOTE — Progress Notes (Signed)
Subjective: 5 Days Post-Op Procedure(s) (LRB): RIGHT TOTAL HIP ARTHROPLASTY ANTERIOR APPROACH (Right) Patient reports pain as mild.    Objective: Vital signs in last 24 hours: Temp:  [97.5 F (36.4 C)-98.7 F (37.1 C)] 98.6 F (37 C) (11/01 0751) Pulse Rate:  [70-77] 70 (11/01 0755) Resp:  [16-18] 18 (11/01 0755) BP: (124-136)/(72-81) 126/81 (11/01 0751) SpO2:  [95 %-97 %] 95 % (11/01 0755)  Intake/Output from previous day: 10/31 0701 - 11/01 0700 In: -  Out: 400 [Urine:400] Intake/Output this shift: No intake/output data recorded.  No results for input(s): HGB in the last 72 hours. No results for input(s): WBC, RBC, HCT, PLT in the last 72 hours. No results for input(s): NA, K, CL, CO2, BUN, CREATININE, GLUCOSE, CALCIUM in the last 72 hours. No results for input(s): LABPT, INR in the last 72 hours.  Neurologically intact Neurovascular intact Sensation intact distally Intact pulses distally Dorsiflexion/Plantar flexion intact Incision: dressing C/D/I No cellulitis present Compartment soft   Assessment/Plan: 5 Days Post-Op Procedure(s) (LRB): RIGHT TOTAL HIP ARTHROPLASTY ANTERIOR APPROACH (Right) Up with therapy WBAT RLE Patient would really like to go home with hhpt, but notes that there is nobody there to help him.  We discussed the need to progress with PT prior to this.  He will further discuss d/c home vs snf tomorrow with Dr. Vanice Sarah 04/11/2019, 9:24 AM

## 2019-04-11 NOTE — Progress Notes (Signed)
Physical Therapy Treatment Patient Details Name: Steve Riley MRN: [id number] DOB: 1951-05-15 Today's Date: 04/11/2019    History of Present Illness Pt is a 68 y/o male s/p R THA, direct anterior approach secondary to avascular necrosis. PMH including but not limited to COPD.    PT Comments    Continuing work on functional mobility and activity tolerance;  Notable progress with progressive ambulation, though still limited by shortness of breath; Walked on [employer] -- did not get an O2 sat while walking, but DOE up to 3/4 (pt reports mask makes it harder); O2 sat 94% after a few minutes of seated rest post amb;   I am seeing throughout notes his desire to dc home -- but also no reliable support for help at home; Of course if he had reliable at or near 24 hour support at home, the decision to go home would be simple;   At this point, our fail-safe recommendation is for SNF for post-acute rehab to maximize independence and safety with mobility and ADLs prior to DC home; a SNF stay would give him the time and a bit more intense training (PT 5-7x/week, instead of 3-5x/week) than HHPT; for home, we would need to consider IADLs (groceries can be delivered, but can be difficult to put away; cooking would prove to be difficult; Consider Meals on Wheels?);   PT would be more comfortable with DC home if he can have consistent, daily help in the home -- even if it's not necessarily 24 hours; Does the [facility] insurance support a HHAide?  Will continue to follow progress, and update dc recs as needed  Follow Up Recommendations  SNF;Supervision/Assistance - 24 hour;Follow surgeon's recommendation for DC plan and follow-up therapies     Equipment Recommendations  3in1 (PT)    Recommendations for Other Services       Precautions / Restrictions Precautions Precautions: Fall Precaution Comments: HOH Restrictions RLE Weight Bearing: Weight bearing as tolerated    Mobility  Bed Mobility Overal bed  mobility: Needs Assistance Bed Mobility: Sit to Supine       Sit to supine: Min guard(for safety and cues)   General bed mobility comments: Slow moving, able to get RLE into bed without physical assist  Transfers Overall transfer level: Needs assistance Equipment used: Rolling walker (2 wheeled) Transfers: Sit to/from Stand Sit to Stand: Supervision         General transfer comment: supervision for safety  Ambulation/Gait Ambulation/Gait assistance: Min guard(with and without physical contact) Gait Distance (Feet): 120 Feet Assistive device: Rolling walker (2 wheeled) Gait Pattern/deviations: Decreased stride length;Decreased step length - right;Decreased step length - left;Antalgic;Step-through pattern Gait velocity: decreased   General Gait Details: Pt with slow mildly antlagic gait. Cues to stand tall on RLE to encourage quad and gluteal recruitment in stance; Distance improved, but still limited secondary to SOB. no LOB noted, pt overall steady with RW   Stairs             Wheelchair Mobility    Modified Rankin (Stroke Patients Only)       Balance Overall balance assessment: Needs assistance Sitting-balance support: Feet supported Sitting balance-Leahy Scale: Good     Standing balance support: During functional activity Standing balance-Leahy Scale: Fair                              Cognition Arousal/Alertness: Awake/alert Behavior During Therapy: WFL for tasks assessed/performed Overall Cognitive Status: Within  Functional Limits for tasks assessed                                        Exercises Total Joint Exercises Ankle Circles/Pumps: AROM;Both;20 reps Quad Sets: AROM;Right;15 reps Gluteal Sets: AROM;Both;10 reps Towel Squeeze: AROM;Both;15 reps Heel Slides: AROM;Strengthening;Right;10 reps;Supine Hip ABduction/ADduction: AROM;Right;10 reps;Supine(with manual tracking resistence applied)    General Comments         Pertinent Vitals/Pain Pain Assessment: 0-10 Pain Score: 4  Pain Location: R hip Pain Descriptors / Indicators: Burning Pain Intervention(s): Monitored during session;Repositioned    Home Living                      Prior Function            PT Goals (current goals can now be found in the care plan section) Acute Rehab PT Goals Patient Stated Goal: decrease pain PT Goal Formulation: With patient Time For Goal Achievement: 04/21/19 Potential to Achieve Goals: Good Progress towards PT goals: Progressing toward goals    Frequency    7X/week      PT Plan Current plan remains appropriate    Co-evaluation              AM-PAC PT "6 Clicks" Mobility   Outcome Measure  Help needed turning from your back to your side while in a flat bed without using bedrails?: None Help needed moving from lying on your back to sitting on the side of a flat bed without using bedrails?: None Help needed moving to and from a bed to a chair (including a wheelchair)?: A Little Help needed standing up from a chair using your arms (e.g., wheelchair or bedside chair)?: A Little Help needed to walk in hospital room?: A Little Help needed climbing 3-5 steps with a railing? : A Little 6 Click Score: 20    End of Session Equipment Utilized During Treatment: Gait belt Activity Tolerance: Patient tolerated treatment well Patient left: in bed;with call bell/phone within reach;with bed alarm set Nurse Communication: Mobility status PT Visit Diagnosis: Other abnormalities of gait and mobility (R26.89);Pain Pain - Right/Left: Right Pain - part of body: Hip     Time: 0801-0829 PT Time Calculation (min) (ACUTE ONLY): 28 min  Charges:  $Gait Training: 8-22 mins $Therapeutic Exercise: 8-22 mins                     Roney Marion, PT  Acute Rehabilitation Services Pager [phone] Office [place] 04/11/2019, 10:56 AM

## 2019-04-11 NOTE — Plan of Care (Signed)
  Problem: Pain Managment: Goal: General experience of comfort will improve Outcome: Progressing   Problem: Safety: Goal: Ability to remain free from injury will improve Outcome: Progressing   Problem: Skin Integrity: Goal: Risk for impaired skin integrity will decrease Outcome: Progressing   

## 2019-04-12 ENCOUNTER — Encounter (HOSPITAL_COMMUNITY): Payer: Self-pay | Admitting: Orthopaedic Surgery

## 2019-04-12 MED ORDER — ASPIRIN 81 MG PO CHEW: 81.0000 mg | CHEWABLE_TABLET | Freq: Two times a day (BID) | ORAL | 0 refills | Status: DC

## 2019-04-12 MED ORDER — METHOCARBAMOL 500 MG PO TABS: 500.0000 mg | ORAL_TABLET | Freq: Four times a day (QID) | ORAL | 0 refills | Status: DC | PRN

## 2019-04-12 MED ORDER — OXYCODONE HCL 5 MG PO TABS: 5.0000 mg | ORAL_TABLET | ORAL | 0 refills | Status: DC | PRN

## 2019-04-12 NOTE — Progress Notes (Signed)
Discussed with the patient and all questioned fully answered. He will call me if any problems arise.  No discharge date for patient encounter.   Patient has hard copies of prescriptions to be picked up at local pharmacy (Oxycodone, Aspirin, Methocarbamol  Allergies as of 04/12/2019      Reactions   Penicillin G Other (See Comments)   "passed out"/childhood allergy Did it involve swelling of the face/tongue/throat, SOB, or low BP? No Did it involve sudden or severe rash/hives, skin peeling, or any reaction on the inside of your mouth or nose? No Did you need to seek medical attention at a hospital or doctor's office? No When did it last happen?childhood allergy If all above answers are "NO", may proceed with cephalosporin use.      Medication List    TAKE these medications   albuterol 108 (90 Base) MCG/ACT inhaler Commonly known as: VENTOLIN HFA Inhale 2 puffs into the lungs every 6 (six) hours as needed for wheezing.   aspirin 81 MG chewable tablet Chew 1 tablet (81 mg total) by mouth 2 (two) times daily.   Calcium 600/Vitamin D 600-400 MG-UNIT chew tablet Generic drug: Calcium Carbonate-Vitamin D Chew 1 tablet by mouth daily.   celecoxib 100 MG capsule Commonly known as: CELEBREX Take 100 mg by mouth daily.   Centrum Silver tablet Take 1 tablet by mouth daily.   Flaxseed Oil 1200 MG Caps Take 1,200 mg by mouth daily.   Flurandrenolide 4 MCG/SQCM Tape Apply 4 each topically daily.   GLUCOMANNAN KONJAC ROOT PO Take 1 tablet by mouth daily as needed (bowel regularity).   HM Super Vitamin B Complex/C Tabs Take by mouth.   magnesium oxide 400 MG tablet Commonly known as: MAG-OX Take 400 mg by mouth daily.   methocarbamol 500 MG tablet Commonly known as: ROBAXIN Take 1 tablet (500 mg total) by mouth every 6 (six) hours as needed for muscle spasms.   MSM 1000 MG Caps Take 1,000 mg by mouth 2 (two) times daily.   Omega-3 1000 MG Caps Take 1 g by mouth 2  (two) times daily.   OVER THE COUNTER MEDICATION Take 4,000 Units by mouth daily. Serrapeptase   oxyCODONE 5 MG immediate release tablet Commonly known as: Oxy IR/ROXICODONE Take 1-2 tablets (5-10 mg total) by mouth every 4 (four) hours as needed for moderate pain (pain score 4-6).   Symbicort 80-4.5 MCG/ACT inhaler Generic drug: budesonide-formoterol Inhale 2 puffs into the lungs 2 (two) times daily.   traZODone 100 MG tablet Commonly known as: DESYREL Take 100 mg by mouth at bedtime.   Turmeric Curcumin 500 MG Caps Take 500 mg by mouth daily.   vitamin C with rose hips 1000 MG tablet Take 1,000 mg by mouth daily.   Zinc 50 MG Tabs Take 50 mg by mouth daily.            Durable Medical Equipment  (From admission, onward)         Start     Ordered   04/12/19 1035  For home use only DME Walker rolling  Once    Question:  Patient needs a walker to treat with the following condition  Answer:  Status post hip surgery   04/12/19 1036   04/06/19 1603  DME 3 n 1  Once     04/06/19 1603   04/06/19 1603  DME Walker rolling  Once    Question:  Patient needs a walker to treat with the following condition  Answer:  Status post total replacement of right hip   04/06/19 1603

## 2019-04-12 NOTE — TOC Transition Note (Signed)
Transition of Care [employer]) - CM/SW Discharge Note   Patient Details  Name: Steve Riley MRN: [id number] Date of Birth: Apr 26, 1951  [facility] CM/SW Contact:  Atilano Median, LCSW Phone Number: 04/12/2019, 12:59 PM   Clinical Narrative:    Patient discharged home. Patient declined SNF and decided to go home with home health services. HHPT and HHOT arranged. DME delivered to patient's room prior to dc. Family will provide transport. No other needs at this time. Case closed to this SW.    Final next level of care: [place] Barriers to Discharge: Barriers Resolved   Patient Goals and CMS Choice Patient states their goals for this hospitalization and ongoing recovery are:: home CMS Medicare.gov Compare Post Acute Care list provided to:: Patient Choice offered to / list presented to : Patient  Discharge Placement                  Discharge Plan and Services In-house Referral: Clinical Social Work   Post Acute Care Choice: [place]          DME Arranged: 3-N-1, Gilford Rile rolling DME Agency: [employer] Date DME Agency Contacted: 04/12/19 Time DME Agency Contacted: [phone] Representative spoke with at DME Agency: [place]: PT, OT [place] Agency: [facility] (now [facility]) Date [place]: 04/12/19 Time [place]: [phone] Representative spoke with at [place]: [place] ([place]) Interventions     Readmission Risk Interventions No flowsheet data found.

## 2019-04-12 NOTE — Progress Notes (Signed)
Patient ID: Steve Riley, male   DOB: [birth year], 68 y.o.   MRN: [id number] At this point, the patient has been here for 6 days awaiting short-term SNF placement.  He is doing well overall.  His right hip is stable and his vitals are stable.  He can be discharged to home this afternoon.  He does not want to go to SNF at this standpoint.

## 2019-04-12 NOTE — Progress Notes (Signed)
Physical Therapy Treatment Patient Details Name: Steve Riley MRN: [id number] DOB: 1950-10-22 Today's Date: 04/12/2019    History of Present Illness Pt is a 68 y/o male s/p R THA, direct anterior approach secondary to avascular necrosis. PMH including but not limited to COPD.    PT Comments    Continuing work on functional mobility and activity tolerance;  Noting dc plan has changed to HHPT -- he is very pleased; we took time to discuss ways to manage better at home; I spoke with OT colleague to see if he can get seen for ADLs, possibly tub transfer before dc'ing today; Questions solicited and answered   Follow Up Recommendations  Follow surgeon's recommendation for DC plan and follow-up therapies;Home health PT;Other (comment)(HHOT as well as he lives alone)     Equipment Recommendations  3in1 (PT)(he has a RW)    Recommendations for Other Services       Precautions / Restrictions Precautions Precautions: Fall Precaution Comments: HOH Restrictions Weight Bearing Restrictions: Yes RLE Weight Bearing: Weight bearing as tolerated    Mobility  Bed Mobility               General bed mobility comments: in chair upon arrival  Transfers Overall transfer level: Needs assistance Equipment used: Rolling walker (2 wheeled) Transfers: Sit to/from Stand Sit to Stand: Modified independent (Device/Increase time)         General transfer comment: Increased time and care  Ambulation/Gait Ambulation/Gait assistance: Supervision;Modified independent (Device/Increase time) Gait Distance (Feet): 200 Feet Assistive device: Rolling walker (2 wheeled) Gait Pattern/deviations: Decreased stride length;Decreased step length - right;Decreased step length - left;Antalgic;Step-through pattern     General Gait Details: Cues to self monitor for activity tolerance; Supervision progressing to modified independent; noted soem DOE 2/4 with walking on Room Air; spot checked O2 sats --  93%   Stairs Stairs: Yes Stairs assistance: Supervision Stair Management: Two rails;Step to pattern;Forwards(then 1 step with RW) Number of Stairs: 3(2+1) General stair comments: Cues for sequence   Wheelchair Mobility    Modified Rankin (Stroke Patients Only)       Balance                                            Cognition Arousal/Alertness: Awake/alert Behavior During Therapy: WFL for tasks assessed/performed Overall Cognitive Status: Within Functional Limits for tasks assessed                                        Exercises      General Comments General comments (skin integrity, edema, etc.): We discussed ways to make managing independently easier like microwave meals, etc; He did not remember his ride home's number, Nursing student, Daphene Jaeger assisted him in finding the number      Pertinent Vitals/Pain Pain Assessment: 0-10 Pain Score: 2  Pain Location: R hip Pain Descriptors / Indicators: Burning Pain Intervention(s): Monitored during session    Home Living Family/patient expects to be discharged to:: Private residence Living Arrangements: Alone                  Prior Function            PT Goals (current goals can now be found in the care plan section) Acute Rehab PT Goals Patient Stated  Goal: decrease pain, and move his bowels PT Goal Formulation: With patient Time For Goal Achievement: 04/21/19 Potential to Achieve Goals: Good Progress towards PT goals: Progressing toward goals    Frequency    7X/week      PT Plan Discharge plan needs to be updated    Co-evaluation              AM-PAC PT "6 Clicks" Mobility   Outcome Measure  Help needed turning from your back to your side while in a flat bed without using bedrails?: None Help needed moving from lying on your back to sitting on the side of a flat bed without using bedrails?: None Help needed moving to and from a bed to a chair (including  a wheelchair)?: None Help needed standing up from a chair using your arms (e.g., wheelchair or bedside chair)?: None Help needed to walk in hospital room?: None Help needed climbing 3-5 steps with a railing? : A Little 6 Click Score: 23    End of Session   Activity Tolerance: Patient tolerated treatment well Patient left: in chair;with call bell/phone within reach Nurse Communication: Mobility status PT Visit Diagnosis: Other abnormalities of gait and mobility (R26.89);Pain Pain - Right/Left: Right Pain - part of body: Hip     Time: 0851-0950(minus approx 1 unit of time gathering vitals machine, gettign prune juice, waiting med pass) PT Time Calculation (min) (ACUTE ONLY): 59 min  Charges:  $Gait Training: 23-37 mins $Therapeutic Activity: 8-22 mins                     Roney Marion, PT  Acute Rehabilitation Services Pager [phone] Office [place] 04/12/2019, 10:39 AM

## 2019-04-12 NOTE — Progress Notes (Signed)
Occupational Therapy Treatment Patient Details Name: Steve Riley MRN: [id number] DOB: 1950/07/30 Today's Date: 04/12/2019    History of present illness Pt is a 68 y/o male s/p R THA, direct anterior approach secondary to avascular necrosis. PMH including but not limited to COPD.   OT comments  Pt making steady progress towards OT goals this session. Session focus on shower transfer and functional mobility. Pt supervision for functional mobility with RW. Pt complete tub transfer to shower seat with Min guard assist and use of RW. Pt required cues for sequencing of task. Pt reports he would prefer separate shower seat versus using 3n1 and having to transfer 3n1 from toilet to shower. Education provided on where to Paediatric nurse chair as insurance will not cover. Education on ADL/ IADL management at home as DC plan has changed and pt now to DC home with [place]. Will let OTR know about change in POC. Will continue to follow acutely per POC.    Follow Up Recommendations  Home health OT    Equipment Recommendations  3 in 1 bedside commode;Tub/shower seat;Other (comment)    Recommendations for Other Services      Precautions / Restrictions Precautions Precautions: Fall Precaution Comments: HOH Restrictions Weight Bearing Restrictions: Yes RLE Weight Bearing: Weight bearing as tolerated       Mobility Bed Mobility               General bed mobility comments: in chair upon arrival  Transfers Overall transfer level: Needs assistance Equipment used: Rolling walker (2 wheeled) Transfers: Sit to/from Stand Sit to Stand: Supervision         General transfer comment: supervision for safety    Balance Overall balance assessment: Needs assistance Sitting-balance support: Feet supported Sitting balance-Leahy Scale: Good Sitting balance - Comments: able to reach feet from sitting EOB   Standing balance support: During functional activity Standing balance-Leahy Scale: Fair                              ADL either performed or assessed with clinical judgement   ADL Overall ADL's : Needs assistance/impaired                       Lower Body Dressing Details (indicate cue type and reason): pt reports he donned his own pants and shoes this AM, reports at home he leaves his shoes tied and slips tennis shoes on/off without tying, able to access feet with no issues Toilet Transfer: Supervision/safety;RW Toilet Transfer Details (indicate cue type and reason): supervision for safety; simulated, education on using 3n1 over toilet     Tub/ Shower Transfer: Min guard;Tub transfer;Shower seat;Ambulation;Rolling walker;Cueing for sequencing Tub/Shower Transfer Details (indicate cue type and reason): min guard for tub transfer to shower seat with RW;cues for sequencing of task Functional mobility during ADLs: Supervision/safety;Rolling walker General ADL Comments: session focus on tub transfer to shower seat, pt lives alone and would prefer seperate shower seat so he does not have to switch 3n1 from over toilet to shower; pt complete tub transfer to shower seat with RW and min guard assist; education on EC strategies at home and strategie for safe funcitonal mobility at home     Vision Baseline Vision/History: Wears glasses Wears Glasses: Reading only Patient Visual Report: No change from baseline     Perception     Praxis      Cognition Arousal/Alertness: Awake/alert Behavior During Therapy:  WFL for tasks assessed/performed Overall Cognitive Status: Within Functional Limits for tasks assessed                                          Exercises     Shoulder Instructions       General Comments pt on RA throughout session with no reports of SOB, O2 98% after functional mobility    Pertinent Vitals/ Pain       Pain Assessment: 0-10 Pain Score: 1  Pain Location: R hip Pain Descriptors / Indicators: Discomfort Pain  Intervention(s): Monitored during session  Home Living                                          Prior Functioning/Environment              Frequency  Min 2X/week        Progress Toward Goals  OT Goals(current goals can now be found in the care plan section)  Progress towards OT goals: Progressing toward goals  Acute Rehab OT Goals Patient Stated Goal: decrease pain, and move his bowels OT Goal Formulation: With patient Time For Goal Achievement: 04/21/19 Potential to Achieve Goals: Good  Plan Discharge plan remains appropriate;Discharge plan needs to be updated    Co-evaluation                 AM-PAC OT "6 Clicks" Daily Activity     Outcome Measure   Help from another person eating meals?: None Help from another person taking care of personal grooming?: A Little Help from another person toileting, which includes using toliet, bedpan, or urinal?: A Little Help from another person bathing (including washing, rinsing, drying)?: A Little Help from another person to put on and taking off regular upper body clothing?: None Help from another person to put on and taking off regular lower body clothing?: None 6 Click Score: 21    End of Session Equipment Utilized During Treatment: Rolling walker;Other (comment)(shower chair)  OT Visit Diagnosis: Unsteadiness on feet (R26.81);Other abnormalities of gait and mobility (R26.89);Muscle weakness (generalized) (M62.81);Pain Pain - Right/Left: Right Pain - part of body: Hip;Leg   Activity Tolerance Patient tolerated treatment well   Patient Left in chair;with call bell/phone within reach   Nurse Communication          Time: [id number] OT Time Calculation (min): 30 min  Charges: OT General Charges $OT Visit: 1 Visit OT Treatments $Self Care/Home Management : 23-37 mins  Lanier Clam., COTA/L Acute Rehabilitation Services [phone]    Ihor Gully 04/12/2019, 12:12  PM

## 2019-04-12 NOTE — Discharge Summary (Signed)
Patient ID: Steve Riley MRN: [id number] DOB/AGE: 1951/05/18 68 y.o.  Admit date: 04/06/2019 Discharge date: 04/12/2019  Admission Diagnoses:  Principal Problem:   Avascular necrosis of bone of right hip [facility]) Active Problems:   Status post total replacement of right hip   Discharge Diagnoses:  Same  Past Medical History:  Diagnosis Date  . COPD (chronic obstructive pulmonary disease) ([place])   . Pneumonia   . Prostate cancer [facility])     Surgeries: Procedure(s): RIGHT TOTAL HIP ARTHROPLASTY ANTERIOR APPROACH on 04/06/2019   Consultants:   Discharged Condition: Improved  Hospital Course: Steve Riley is an 68 y.o. male who was admitted 04/06/2019 for operative treatment ofAvascular necrosis of bone of right hip ([place]). Patient has severe unremitting pain that affects sleep, daily activities, and work/hobbies. After pre-op clearance the patient was taken to the operating room on 04/06/2019 and underwent  Procedure(s): RIGHT TOTAL HIP ARTHROPLASTY ANTERIOR APPROACH.    Patient was given perioperative antibiotics:  Anti-infectives (From admission, onward)   Start     Dose/Rate Route Frequency Ordered Stop   04/06/19 2000  clindamycin (CLEOCIN) IVPB 600 mg     600 mg 100 mL/hr over 30 Minutes Intravenous Every 6 hours 04/06/19 1603 04/07/19 0240   04/06/19 1130  clindamycin (CLEOCIN) IVPB 900 mg     900 mg 100 mL/hr over 30 Minutes Intravenous On call to O.R. 04/06/19 1127 04/06/19 1232   04/06/19 1054  clindamycin (CLEOCIN) 900 MG/50ML IVPB    Note to Pharmacy: Carolynn Comment, [name]   : cabinet override      04/06/19 1054 04/06/19 1232       Patient was given sequential compression devices, early ambulation, and chemoprophylaxis to prevent DVT.  Patient benefited maximally from hospital stay and there were no complications.    Recent vital signs:  Patient Vitals for the past 24 hrs:  BP Temp Temp src Pulse Resp SpO2  04/12/19 0549 130/67 98.5 F (36.9 C) Oral 70 - 97 %   04/11/19 2030 (!) 129/51 99.1 F (37.3 C) Oral 66 - 99 %  04/11/19 1322 132/77 98 F (36.7 C) Oral 73 16 96 %  04/11/19 0755 - - - 70 18 95 %  04/11/19 0751 126/81 98.6 F (37 C) Oral 71 16 97 %     Recent laboratory studies: No results for input(s): WBC, HGB, HCT, PLT, NA, K, CL, CO2, BUN, CREATININE, GLUCOSE, INR, CALCIUM in the last 72 hours.  Invalid input(s): PT, 2   Discharge Medications:   Allergies as of 04/12/2019      Reactions   Penicillin G Other (See Comments)   "passed out"/childhood allergy Did it involve swelling of the face/tongue/throat, SOB, or low BP? No Did it involve sudden or severe rash/hives, skin peeling, or any reaction on the inside of your mouth or nose? No Did you need to seek medical attention at a hospital or doctor's office? No When did it last happen?childhood allergy If all above answers are "NO", may proceed with cephalosporin use.      Medication List    TAKE these medications   albuterol 108 (90 Base) MCG/ACT inhaler Commonly known as: VENTOLIN HFA Inhale 2 puffs into the lungs every 6 (six) hours as needed for wheezing.   aspirin 81 MG chewable tablet Chew 1 tablet (81 mg total) by mouth 2 (two) times daily.   Calcium 600/Vitamin D 600-400 MG-UNIT chew tablet Generic drug: Calcium Carbonate-Vitamin D Chew 1 tablet by mouth daily.   celecoxib  100 MG capsule Commonly known as: CELEBREX Take 100 mg by mouth daily.   Centrum Silver tablet Take 1 tablet by mouth daily.   Flaxseed Oil 1200 MG Caps Take 1,200 mg by mouth daily.   Flurandrenolide 4 MCG/SQCM Tape Apply 4 each topically daily.   GLUCOMANNAN KONJAC ROOT PO Take 1 tablet by mouth daily as needed (bowel regularity).   HM Super Vitamin B Complex/C Tabs Take by mouth.   magnesium oxide 400 MG tablet Commonly known as: MAG-OX Take 400 mg by mouth daily.   methocarbamol 500 MG tablet Commonly known as: ROBAXIN Take 1 tablet (500 mg total) by mouth every 6  (six) hours as needed for muscle spasms.   MSM 1000 MG Caps Take 1,000 mg by mouth 2 (two) times daily.   Omega-3 1000 MG Caps Take 1 g by mouth 2 (two) times daily.   OVER THE COUNTER MEDICATION Take 4,000 Units by mouth daily. Serrapeptase   oxyCODONE 5 MG immediate release tablet Commonly known as: Oxy IR/ROXICODONE Take 1-2 tablets (5-10 mg total) by mouth every 4 (four) hours as needed for moderate pain (pain score 4-6).   Symbicort 80-4.5 MCG/ACT inhaler Generic drug: budesonide-formoterol Inhale 2 puffs into the lungs 2 (two) times daily.   traZODone 100 MG tablet Commonly known as: DESYREL Take 100 mg by mouth at bedtime.   Turmeric Curcumin 500 MG Caps Take 500 mg by mouth daily.   vitamin C with rose hips 1000 MG tablet Take 1,000 mg by mouth daily.   Zinc 50 MG Tabs Take 50 mg by mouth daily.            Durable Medical Equipment  (From admission, onward)         Start     Ordered   04/06/19 1603  DME 3 n 1  Once     04/06/19 1603   04/06/19 1603  DME Walker rolling  Once    Question:  Patient needs a walker to treat with the following condition  Answer:  Status post total replacement of right hip   04/06/19 1603          Diagnostic Studies: Dg Pelvis Portable  Result Date: 04/06/2019 CLINICAL DATA:  Right hip arthroplasty. EXAM: DG C-ARM 1-60 MIN; OPERATIVE RIGHT HIP WITH PELVIS; PORTABLE PELVIS 1-2 VIEWS CONTRAST:  None. FLUOROSCOPY TIME:  Fluoroscopy Time:  0 minutes 31 seconds. Number of Acquired Spot Images: 7 COMPARISON:  02/22/2019. FINDINGS: Total right hip replacement.  Hardware intact.  Anatomic alignment. IMPRESSION: Total right hip replacement anatomic alignment. Electronically Signed   By: Marcello Moores  Register   On: 04/06/2019 15:16   Dg Chest Port 1 View  Result Date: 04/07/2019 CLINICAL DATA:  Shortness of breath EXAM: PORTABLE CHEST 1 VIEW COMPARISON:  03/06/2018 FINDINGS: Heart and mediastinal contours are within normal limits. No  focal opacities or effusions. No acute bony abnormality. IMPRESSION: No active disease. Electronically Signed   By: Rolm Baptise M.D.   On: 04/07/2019 12:00   Dg C-arm 1-60 Min  Result Date: 04/06/2019 CLINICAL DATA:  Right hip arthroplasty. EXAM: DG C-ARM 1-60 MIN; OPERATIVE RIGHT HIP WITH PELVIS; PORTABLE PELVIS 1-2 VIEWS CONTRAST:  None. FLUOROSCOPY TIME:  Fluoroscopy Time:  0 minutes 31 seconds. Number of Acquired Spot Images: 7 COMPARISON:  02/22/2019. FINDINGS: Total right hip replacement.  Hardware intact.  Anatomic alignment. IMPRESSION: Total right hip replacement anatomic alignment. Electronically Signed   By: Marcello Moores  Register   On: 04/06/2019 15:16   Dg  Hip Operative Unilat With Pelvis Right  Result Date: 04/06/2019 CLINICAL DATA:  Right hip arthroplasty. EXAM: DG C-ARM 1-60 MIN; OPERATIVE RIGHT HIP WITH PELVIS; PORTABLE PELVIS 1-2 VIEWS CONTRAST:  None. FLUOROSCOPY TIME:  Fluoroscopy Time:  0 minutes 31 seconds. Number of Acquired Spot Images: 7 COMPARISON:  02/22/2019. FINDINGS: Total right hip replacement.  Hardware intact.  Anatomic alignment. IMPRESSION: Total right hip replacement anatomic alignment. Electronically Signed   By: Marcello Moores  Register   On: 04/06/2019 15:16    Disposition: Discharge disposition: 01-Home or Self Care          Contact information for follow-up providers    Mcarthur Rossetti, MD Follow up in 2 week(s).   Specialty: Orthopedic Surgery Why: Our new address is  [address] This is next door to our old Teacher, adult education information: [place] [address] [phone]            Contact information for after-discharge care    Destination    [facility] Preferred [facility] .   Service: Skilled Nursing Contact information: [place] [phone]                   Signed: Mcarthur Rossetti 04/12/2019, 7:49 AM

## 2019-04-12 NOTE — Plan of Care (Signed)

## 2019-04-12 NOTE — Progress Notes (Signed)
PT. Discharge instructions being went over, his ride just got to the unit. Will be d/c'd soon

## 2019-04-12 NOTE — Plan of Care (Signed)

## 2019-04-13 ENCOUNTER — Telehealth: Payer: Self-pay | Admitting: Orthopaedic Surgery

## 2019-04-13 DIAGNOSIS — Z9981 Dependence on supplemental oxygen: Secondary | ICD-10-CM | POA: Diagnosis not present

## 2019-04-13 DIAGNOSIS — Z471 Aftercare following joint replacement surgery: Secondary | ICD-10-CM | POA: Diagnosis not present

## 2019-04-13 DIAGNOSIS — F17210 Nicotine dependence, cigarettes, uncomplicated: Secondary | ICD-10-CM | POA: Diagnosis not present

## 2019-04-13 DIAGNOSIS — Z96641 Presence of right artificial hip joint: Secondary | ICD-10-CM | POA: Diagnosis not present

## 2019-04-13 DIAGNOSIS — Z8546 Personal history of malignant neoplasm of prostate: Secondary | ICD-10-CM | POA: Diagnosis not present

## 2019-04-13 DIAGNOSIS — Z9181 History of falling: Secondary | ICD-10-CM | POA: Diagnosis not present

## 2019-04-13 DIAGNOSIS — Z8701 Personal history of pneumonia (recurrent): Secondary | ICD-10-CM | POA: Diagnosis not present

## 2019-04-13 DIAGNOSIS — J441 Chronic obstructive pulmonary disease with (acute) exacerbation: Secondary | ICD-10-CM | POA: Diagnosis not present

## 2019-04-13 NOTE — Telephone Encounter (Signed)
Gracie with [facility] called in requesting verbal orders for home health pt for 3 times a week for 2 weeks.   [phone]

## 2019-04-13 NOTE — Telephone Encounter (Signed)
Verbal order given  

## 2019-04-14 DIAGNOSIS — J441 Chronic obstructive pulmonary disease with (acute) exacerbation: Secondary | ICD-10-CM | POA: Diagnosis not present

## 2019-04-14 DIAGNOSIS — Z9981 Dependence on supplemental oxygen: Secondary | ICD-10-CM | POA: Diagnosis not present

## 2019-04-14 DIAGNOSIS — Z96641 Presence of right artificial hip joint: Secondary | ICD-10-CM | POA: Diagnosis not present

## 2019-04-14 DIAGNOSIS — F17210 Nicotine dependence, cigarettes, uncomplicated: Secondary | ICD-10-CM | POA: Diagnosis not present

## 2019-04-14 DIAGNOSIS — Z8701 Personal history of pneumonia (recurrent): Secondary | ICD-10-CM | POA: Diagnosis not present

## 2019-04-14 DIAGNOSIS — Z8546 Personal history of malignant neoplasm of prostate: Secondary | ICD-10-CM | POA: Diagnosis not present

## 2019-04-14 DIAGNOSIS — Z9181 History of falling: Secondary | ICD-10-CM | POA: Diagnosis not present

## 2019-04-14 DIAGNOSIS — Z471 Aftercare following joint replacement surgery: Secondary | ICD-10-CM | POA: Diagnosis not present

## 2019-04-16 DIAGNOSIS — Z96641 Presence of right artificial hip joint: Secondary | ICD-10-CM | POA: Diagnosis not present

## 2019-04-16 DIAGNOSIS — Z471 Aftercare following joint replacement surgery: Secondary | ICD-10-CM | POA: Diagnosis not present

## 2019-04-16 DIAGNOSIS — F17210 Nicotine dependence, cigarettes, uncomplicated: Secondary | ICD-10-CM | POA: Diagnosis not present

## 2019-04-16 DIAGNOSIS — Z8701 Personal history of pneumonia (recurrent): Secondary | ICD-10-CM | POA: Diagnosis not present

## 2019-04-16 DIAGNOSIS — J441 Chronic obstructive pulmonary disease with (acute) exacerbation: Secondary | ICD-10-CM | POA: Diagnosis not present

## 2019-04-16 DIAGNOSIS — Z8546 Personal history of malignant neoplasm of prostate: Secondary | ICD-10-CM | POA: Diagnosis not present

## 2019-04-16 DIAGNOSIS — Z9981 Dependence on supplemental oxygen: Secondary | ICD-10-CM | POA: Diagnosis not present

## 2019-04-16 DIAGNOSIS — Z9181 History of falling: Secondary | ICD-10-CM | POA: Diagnosis not present

## 2019-04-19 ENCOUNTER — Telehealth: Payer: Self-pay | Admitting: Radiology

## 2019-04-19 NOTE — Telephone Encounter (Signed)
FYI

## 2019-04-19 NOTE — Telephone Encounter (Signed)
FYI  Patient called triage this morning with problems of stomachache and blood in stool. He states that he has not felt well and has not been eating. He had a large bowel movement yesterday and a smaller one today with a lot of blood in his stool. Patient has been having regular bowel movements and has not had Oxycodone in 2 days.   I advised patient that he needs to see his PCP and he states that "I will be dead by the time that they can see me" due to being a Drain.  I then advised patient that he should go to the ED to be evaluated.  He states that he is going to see how it goes today and will then go if needed.

## 2019-04-20 DIAGNOSIS — F17210 Nicotine dependence, cigarettes, uncomplicated: Secondary | ICD-10-CM | POA: Diagnosis not present

## 2019-04-20 DIAGNOSIS — Z8701 Personal history of pneumonia (recurrent): Secondary | ICD-10-CM | POA: Diagnosis not present

## 2019-04-20 DIAGNOSIS — J441 Chronic obstructive pulmonary disease with (acute) exacerbation: Secondary | ICD-10-CM | POA: Diagnosis not present

## 2019-04-20 DIAGNOSIS — Z9981 Dependence on supplemental oxygen: Secondary | ICD-10-CM | POA: Diagnosis not present

## 2019-04-20 DIAGNOSIS — Z8546 Personal history of malignant neoplasm of prostate: Secondary | ICD-10-CM | POA: Diagnosis not present

## 2019-04-20 DIAGNOSIS — Z471 Aftercare following joint replacement surgery: Secondary | ICD-10-CM | POA: Diagnosis not present

## 2019-04-20 DIAGNOSIS — Z9181 History of falling: Secondary | ICD-10-CM | POA: Diagnosis not present

## 2019-04-20 DIAGNOSIS — Z96641 Presence of right artificial hip joint: Secondary | ICD-10-CM | POA: Diagnosis not present

## 2019-04-21 ENCOUNTER — Encounter: Payer: Self-pay | Admitting: Physician Assistant

## 2019-04-21 ENCOUNTER — Ambulatory Visit (INDEPENDENT_AMBULATORY_CARE_PROVIDER_SITE_OTHER): Payer: No Typology Code available for payment source | Admitting: Physician Assistant

## 2019-04-21 ENCOUNTER — Other Ambulatory Visit: Payer: Self-pay

## 2019-04-21 DIAGNOSIS — Z96641 Presence of right artificial hip joint: Secondary | ICD-10-CM

## 2019-04-21 NOTE — Progress Notes (Signed)
HPI: [name] is now 2 weeks status post right total hip arthroplasty.  He states that he has some pain in the hip but quit taking all the medications due to constipation.  He states that he had fevers chills this past Sunday before he is able to have a bowel movement.  Overall trending towards improvement.  He has had no chest pain shortness of breath.  He is ambulating with a cane and is working with physical therapy at home.  He also stopped his aspirin.  Physical exam: General well-developed well-nourished male ambulating with a cane.  Surgical incisions well approximated staples no signs of infection.  Dorsiflexion plantarflexion right ankle intact.  Calf supple nontender.  Impression: 2 weeks status post right total hip arthroplasty  Plan: Staples removed Steri-Strips applied.  He is able to get the incision wet shower.  Encouraged him to keep the proximal portion of the incision dry.  He will follow-up with [place] in 4 weeks sooner if there is any questions concerns.

## 2019-04-22 DIAGNOSIS — Z8701 Personal history of pneumonia (recurrent): Secondary | ICD-10-CM | POA: Diagnosis not present

## 2019-04-22 DIAGNOSIS — Z8546 Personal history of malignant neoplasm of prostate: Secondary | ICD-10-CM | POA: Diagnosis not present

## 2019-04-22 DIAGNOSIS — Z471 Aftercare following joint replacement surgery: Secondary | ICD-10-CM | POA: Diagnosis not present

## 2019-04-22 DIAGNOSIS — Z9981 Dependence on supplemental oxygen: Secondary | ICD-10-CM | POA: Diagnosis not present

## 2019-04-22 DIAGNOSIS — Z9181 History of falling: Secondary | ICD-10-CM | POA: Diagnosis not present

## 2019-04-22 DIAGNOSIS — Z96641 Presence of right artificial hip joint: Secondary | ICD-10-CM | POA: Diagnosis not present

## 2019-04-22 DIAGNOSIS — J441 Chronic obstructive pulmonary disease with (acute) exacerbation: Secondary | ICD-10-CM | POA: Diagnosis not present

## 2019-04-22 DIAGNOSIS — F17210 Nicotine dependence, cigarettes, uncomplicated: Secondary | ICD-10-CM | POA: Diagnosis not present

## 2019-04-23 DIAGNOSIS — F17210 Nicotine dependence, cigarettes, uncomplicated: Secondary | ICD-10-CM | POA: Diagnosis not present

## 2019-04-23 DIAGNOSIS — Z9181 History of falling: Secondary | ICD-10-CM | POA: Diagnosis not present

## 2019-04-23 DIAGNOSIS — J441 Chronic obstructive pulmonary disease with (acute) exacerbation: Secondary | ICD-10-CM | POA: Diagnosis not present

## 2019-04-23 DIAGNOSIS — Z9981 Dependence on supplemental oxygen: Secondary | ICD-10-CM | POA: Diagnosis not present

## 2019-04-23 DIAGNOSIS — Z96641 Presence of right artificial hip joint: Secondary | ICD-10-CM | POA: Diagnosis not present

## 2019-04-23 DIAGNOSIS — Z8701 Personal history of pneumonia (recurrent): Secondary | ICD-10-CM | POA: Diagnosis not present

## 2019-04-23 DIAGNOSIS — Z8546 Personal history of malignant neoplasm of prostate: Secondary | ICD-10-CM | POA: Diagnosis not present

## 2019-04-23 DIAGNOSIS — Z471 Aftercare following joint replacement surgery: Secondary | ICD-10-CM | POA: Diagnosis not present

## 2019-05-19 ENCOUNTER — Encounter: Payer: Self-pay | Admitting: Physician Assistant

## 2019-05-19 ENCOUNTER — Ambulatory Visit (INDEPENDENT_AMBULATORY_CARE_PROVIDER_SITE_OTHER): Payer: No Typology Code available for payment source

## 2019-05-19 ENCOUNTER — Other Ambulatory Visit: Payer: Self-pay

## 2019-05-19 ENCOUNTER — Ambulatory Visit (INDEPENDENT_AMBULATORY_CARE_PROVIDER_SITE_OTHER): Payer: No Typology Code available for payment source | Admitting: Physician Assistant

## 2019-05-19 DIAGNOSIS — M87052 Idiopathic aseptic necrosis of left femur: Secondary | ICD-10-CM

## 2019-05-19 DIAGNOSIS — Z96641 Presence of right artificial hip joint: Secondary | ICD-10-CM

## 2019-05-19 NOTE — Progress Notes (Signed)
Office Visit Note   Patient: Steve Riley           Date of Birth: 17-Jun-1950           MRN: [id number] Visit Date: 05/19/2019              Requested by: No referring provider defined for this encounter. PCP: System, Pcp Not In   Assessment & Plan: Visit Diagnoses:  1. Avascular necrosis of hip, left (HCC)   2. Status post total replacement of right hip     Plan: Due to the fact the patient is having severe pain in his left hip has known severe AVN of the left hip would recommend left total hip arthroplasty.  Patient will need approval through the [place] prior to surgery.  We will try to gain approval.  He understands risk benefits of surgery also understands postop protocol.  He is getting Samella Parr card who will schedule his surgery once he has been approved.  He will follow up 2 weeks postop.  Questions encouraged and answered at length.  Follow-Up Instructions: Return 2 weeks postop.   Orders:  Orders Placed This Encounter  Procedures  . XR HIP UNILAT W OR W/O PELVIS 1V LEFT   No orders of the defined types were placed in this encounter.     Procedures: No procedures performed   Clinical Data: No additional findings.   Subjective: Chief Complaint  Patient presents with  . Right Hip - Routine Post Op  . Left Hip - Pain    HPI Steve Riley returns today 43 days status post right total hip arthroplasty.  His right hip is doing well.  He is using a cane due to the fact that his left hip is very painful.  He is having trouble sleeping due to the pain in the left hip.  Notes he has minimal range of motion of the hip due to the pain in the left hip groin.  Activities of daily living are difficult for him. Review of Systems No fevers or chills.  Objective: Vital Signs: There were no vitals taken for this visit.  Physical Exam Constitutional:      Appearance: He is not ill-appearing or diaphoretic.  Pulmonary:     Effort: Pulmonary effort is normal.   Neurological:     Mental Status: He is alert and oriented to person, place, and time.  Psychiatric:        Mood and Affect: Mood normal.        Behavior: Behavior normal.     Ortho Exam Right hip excellent range of motion without pain.  Left hip severe pain with internal and external rotation.  Ambulates with a cane due to left hip pain. Specialty Comments:  No specialty comments available.  Imaging: Xr Hip Unilat W Or W/o Pelvis 1v Left  Result Date: 05/19/2019 AP pelvis and lateral view left hip: Right total hip arthroplasty components well seated.  Left hip with severe AVN changes.  No femoral head collapse no acute fractures.    PMFS History: Patient Active Problem List   Diagnosis Date Noted  . Avascular necrosis of hip, left ([place]) 04/06/2019  . 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 COPD exacerbation ([place]) 03/06/2018  . 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 Malignant neoplasm of prostate ([place]) 03/12/2017   Past Medical History:  Diagnosis Date  . COPD (chronic obstructive pulmonary disease) ([place])   . Pneumonia   . Prostate cancer ([place])  Family History  Problem Relation Age of Onset  . Cancer Neg Hx     Past Surgical History:  Procedure Laterality Date  . BACK SURGERY    . HAND SURGERY    . PROSTATE BIOPSY    . TOTAL HIP ARTHROPLASTY Right 04/06/2019   Procedure: RIGHT TOTAL HIP ARTHROPLASTY ANTERIOR APPROACH;  Surgeon: Mcarthur Rossetti, MD;  Location: [facility];  Service: Orthopedics;  Laterality: Right;   Social History   Occupational History  . Not on file  Tobacco Use  . Smoking status: Current Some Day Smoker    Packs/day: 3.00    Years: 54.00    Pack years: 162.00    Types: Cigarettes    Last attempt to quit: 07/11/2016    Years since quitting: 2.8  . Smokeless tobacco: Never Used  . Tobacco comment: occassionally 1-3 per week  Substance and Sexual Activity  . Alcohol use: No  . Drug use: No  . Sexual activity: Not Currently

## 2019-07-14 ENCOUNTER — Encounter: Payer: Self-pay | Admitting: Orthopaedic Surgery

## 2019-07-14 ENCOUNTER — Ambulatory Visit (INDEPENDENT_AMBULATORY_CARE_PROVIDER_SITE_OTHER): Payer: No Typology Code available for payment source | Admitting: Orthopaedic Surgery

## 2019-07-14 ENCOUNTER — Other Ambulatory Visit: Payer: Self-pay

## 2019-07-14 VITALS — Ht 68.0 in | Wt 195.0 lb

## 2019-07-14 DIAGNOSIS — Z96641 Presence of right artificial hip joint: Secondary | ICD-10-CM

## 2019-07-14 DIAGNOSIS — M87052 Idiopathic aseptic necrosis of left femur: Secondary | ICD-10-CM

## 2019-07-14 NOTE — Progress Notes (Signed)
Office Visit Note   Patient: Steve Riley           Date of Birth: 1951-03-04           MRN: [id number] Visit Date: 07/14/2019              Requested by: No referring provider defined for this encounter. PCP: System, Pcp Not In   Assessment & Plan: Visit Diagnoses:  1. Status post total replacement of right hip   2. Avascular necrosis of hip, left ([place])     Plan: At this point he needs to try to stay off his left hip as much as he can.  We need to set him up for a total hip arthroplasty as soon as they allow [place] to have a patient that can stay overnight.  He understands there are some delays due to the Covid pandemic.  He is not someone that I be comfortable sending home the same day given his fall risk and the fact that he had to stay a day in the hospital after his other hip replacement just over 3 months ago.  He understands what we are explaining to him and our recommendations and does wish to proceed with surgery on the left hip given the severity of his pain and given the fact that he has had a good outcome from a right total hip arthroplasty to treat AVN of the right hip.  We will work on getting him on the schedule.  We would then see him back at 2 weeks postoperative.  Follow-Up Instructions: Return for 2 weeks post-op.   Orders:  No orders of the defined types were placed in this encounter.  No orders of the defined types were placed in this encounter.     Procedures: No procedures performed   Clinical Data: No additional findings.   Subjective: Chief Complaint  Patient presents with  . Left Hip - Follow-up  Patient is a very pleasant 69 year old gentleman well-known to [place].  He is over 3 months out from a right total hip arthroplasty to treat avascular necrosis of his right hip.  His left hip now has become very painful.  He has known avascular necrosis of his left hip.  He ambulates with a cane.  His pain is become severe and is daily.  He is a fall risk at this  point.  HPI  Review of Systems He currently denies any headache, chest pain, shortness of breath, fever, chills, nausea, vomiting  Objective: Vital Signs: Ht 5\' 8"  (1.727 m)   Wt 195 lb (88.5 kg)   BMI 29.65 kg/m   Physical Exam He is alert and orient x3 and in no acute distress Ortho Exam Examination of his left hip shows severe pain with any attempts of internal and external rotation.  Examination of his right operative hip shows smooth and fluid range of motion. Specialty Comments:  No specialty comments available.  Imaging: No results found. Recent x-rays of his pelvis and left hip shows avascular process of the left hip with flattening of the femoral head and cystic changes in the femoral head.  PMFS History: Patient Active Problem List   Diagnosis Date Noted  . Avascular necrosis of hip, left ([place]) 04/06/2019  . 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 COPD exacerbation ([place]) 03/06/2018  . 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 Malignant neoplasm of prostate ([place]) 03/12/2017   Past Medical History:  Diagnosis Date  . COPD (chronic obstructive pulmonary  disease) ([place])   . Pneumonia   . Prostate cancer [facility])     Family History  Problem Relation Age of Onset  . Cancer Neg Hx     Past Surgical History:  Procedure Laterality Date  . BACK SURGERY    . HAND SURGERY    . PROSTATE BIOPSY    . TOTAL HIP ARTHROPLASTY Right 04/06/2019   Procedure: RIGHT TOTAL HIP ARTHROPLASTY ANTERIOR APPROACH;  Surgeon: Mcarthur Rossetti, MD;  Location: [place];  Service: Orthopedics;  Laterality: Right;   Social History   Occupational History  . Not on file  Tobacco Use  . Smoking status: Current Some Day Smoker    Packs/day: 3.00    Years: 54.00    Pack years: 162.00    Types: Cigarettes    Last attempt to quit: 07/11/2016    Years since quitting: 3.0  . Smokeless tobacco: Never Used  . Tobacco comment: occassionally 1-3 per week  Substance and Sexual Activity   . Alcohol use: No  . Drug use: No  . Sexual activity: Not Currently

## 2019-08-05 ENCOUNTER — Other Ambulatory Visit: Payer: Self-pay

## 2019-08-13 ENCOUNTER — Other Ambulatory Visit: Payer: Self-pay | Admitting: Physician Assistant

## 2019-08-17 NOTE — Progress Notes (Signed)
[place], [place] - [id number] [place] [address] Phone: [phone] Fax: [phone]      Your procedure is scheduled on August 24, 2019.  Report to [facility] Main Entrance "A" at 10:00 A.M., and check in at the Admitting office.  Call this number if you have problems the morning of surgery:  [phone]  Call [phone] if you have any questions prior to your surgery date Monday-Friday 8am-4pm    Remember:  Do not eat  after midnight the night before your surgery  You may drink clear liquids until 9:00 A.M.  the morning of your surgery.   Clear liquids allowed are: Water, Non-Citrus Juices (without pulp), Carbonated Beverages, Clear Tea, Black Coffee Only, and Gatorade  Please complete your PRE-SURGERY ENSURE that was provided to you by 9:00 A.M. the morning of surgery.  Please, if able, drink it in one setting. DO NOT SIP.    Take these medicines the morning of surgery with A SIP OF WATER: budesonide-formoterol (SYMBICORT) Tiotropium Bromide Monohydrate (SPIRIVA RESPIMAT)  albuterol (PROVENTIL HFA;VENTOLIN HFA) - if needed  7 days prior to surgery STOP taking any Aspirin (unless otherwise instructed by your surgeon), Aleve, Naproxen, Ibuprofen, Motrin, Advil, Goody's, BC's, all herbal medications, fish oil, and all vitamins.    The Morning of Surgery  Do not wear jewelry.  Do not wear lotions, powders, colognes, or deodorant    Men may shave face and neck.  Do not bring valuables to the hospital.  [facility] is not responsible for any belongings or valuables.  If you are a smoker, DO NOT Smoke 24 hours prior to surgery  If you wear a CPAP at night please bring your mask the morning of surgery   Remember that you must have someone to transport you home after your surgery, and remain with you for 24 hours if you are discharged the same day.   Please bring cases for contacts, glasses, hearing aids,  dentures or bridgework because it cannot be worn into surgery.    Leave your suitcase in the car.  After surgery it may be brought to your room.  For patients admitted to the hospital, discharge time will be determined by your treatment team.  Patients discharged the day of surgery will not be allowed to drive home.    Special instructions:   [place]- Preparing For Surgery  Before surgery, you can play an important role. Because skin is not sterile, your skin needs to be as free of germs as possible. You can reduce the number of germs on your skin by washing with CHG (chlorahexidine gluconate) Soap before surgery.  CHG is an antiseptic cleaner which kills germs and bonds with the skin to continue killing germs even after washing.    Oral Hygiene is also important to reduce your risk of infection.  Remember - BRUSH YOUR TEETH THE MORNING OF SURGERY WITH YOUR REGULAR TOOTHPASTE  Please do not use if you have an allergy to CHG or antibacterial soaps. If your skin becomes reddened/irritated stop using the CHG.  Do not shave (including legs and underarms) for at least 48 hours prior to first CHG shower. It is OK to shave your face.  Please follow these instructions carefully.   1. Shower the NIGHT BEFORE SURGERY and the MORNING OF SURGERY with CHG Soap.   2. If you chose to wash your hair, wash your hair first as usual with your normal shampoo.  3. After  you shampoo, rinse your hair and body thoroughly to remove the shampoo.  4. Use CHG as you would any other liquid soap. You can apply CHG directly to the skin and wash gently with a scrungie or a clean washcloth.   5. Apply the CHG Soap to your body ONLY FROM THE NECK DOWN.  Do not use on open wounds or open sores. Avoid contact with your eyes, ears, mouth and genitals (private parts). Wash Face and genitals (private parts)  with your normal soap.   6. Wash thoroughly, paying special attention to the area where your surgery will be  performed.  7. Thoroughly rinse your body with warm water from the neck down.  8. DO NOT shower/wash with your normal soap after using and rinsing off the CHG Soap.  9. Pat yourself dry with a CLEAN TOWEL.  10. Wear CLEAN PAJAMAS to bed the night before surgery, wear comfortable clothes the morning of surgery  11. Place CLEAN SHEETS on your bed the night of your first shower and DO NOT SLEEP WITH PETS.    Day of Surgery:  Please shower the morning of surgery with the CHG soap Do not apply any deodorants/lotions. Please wear clean clothes to the hospital/surgery center.   Remember to brush your teeth WITH YOUR REGULAR TOOTHPASTE.   Please read over the following fact sheets that you were given.

## 2019-08-18 ENCOUNTER — Encounter (HOSPITAL_COMMUNITY): Payer: Self-pay

## 2019-08-18 ENCOUNTER — Other Ambulatory Visit: Payer: Self-pay

## 2019-08-18 ENCOUNTER — Encounter (HOSPITAL_COMMUNITY)
Admission: RE | Admit: 2019-08-18 | Discharge: 2019-08-18 | Disposition: A | Discharge status: Home / Self Care | Payer: No Typology Code available for payment source | Source: Ambulatory Visit | Attending: Orthopaedic Surgery | Admitting: Orthopaedic Surgery

## 2019-08-18 DIAGNOSIS — Z01812 Encounter for preprocedural laboratory examination: Secondary | ICD-10-CM | POA: Diagnosis present

## 2019-08-18 LAB — CBC
HCT: 46.3 % (ref 39.0–52.0)
Hemoglobin: 15.2 g/dL (ref 13.0–17.0)
MCH: 29.2 pg (ref 26.0–34.0)
MCHC: 32.8 g/dL (ref 30.0–36.0)
MCV: 89 fL (ref 80.0–100.0)
Platelets: 319 10*3/uL (ref 150–400)
RBC: 5.2 MIL/uL (ref 4.22–5.81)
RDW: 13 % (ref 11.5–15.5)
WBC: 7.5 10*3/uL (ref 4.0–10.5)
nRBC: 0 % (ref 0.0–0.2)

## 2019-08-18 LAB — GLUCOSE, CAPILLARY: Glucose-Capillary: 92 mg/dL (ref 70–99)

## 2019-08-18 LAB — SURGICAL PCR SCREEN
MRSA, PCR: NEGATIVE
Staphylococcus aureus: NEGATIVE

## 2019-08-18 NOTE — Progress Notes (Signed)
[facility] - [facility]   Chest x-ray - 04/07/19 1 view EKG - 03/09/18 - per Jeneen Rinks PA, pt does not need another one unless symptomatic Stress Test - pt denies ECHO - pt denies Cardiac Cath - pt denies  Aspirin Instructions: pt is no longer taking ASA.   ERAS Protcol - yes PRE-SURGERY Ensure or G2- ensure  COVID TEST- per pt, covid test scheduled 08/20/19, pt verbalized understanding for self-quarantine after testing.    Anesthesia review: n/a Patient denies shortness of breath, fever, cough and chest pain at PAT appointment   All instructions explained to the patient, with a verbal understanding of the material. Patient agrees to go over the instructions while at home for a better understanding. Patient also instructed to self quarantine after being tested for COVID-19. The opportunity to ask questions was provided.

## 2019-08-20 ENCOUNTER — Other Ambulatory Visit (HOSPITAL_COMMUNITY)
Admission: RE | Admit: 2019-08-20 | Discharge: 2019-08-20 | Disposition: A | Discharge status: Home / Self Care | Payer: No Typology Code available for payment source | Source: Ambulatory Visit | Attending: Orthopaedic Surgery | Admitting: Orthopaedic Surgery

## 2019-08-20 DIAGNOSIS — Z01812 Encounter for preprocedural laboratory examination: Secondary | ICD-10-CM | POA: Insufficient documentation

## 2019-08-20 DIAGNOSIS — Z20822 Contact with and (suspected) exposure to covid-19: Secondary | ICD-10-CM | POA: Insufficient documentation

## 2019-08-20 LAB — SARS CORONAVIRUS 2 (TAT 6-24 HRS): SARS Coronavirus 2: NEGATIVE

## 2019-08-23 NOTE — Anesthesia Preprocedure Evaluation (Addendum)
Anesthesia Evaluation  Patient identified by MRN, date of birth, ID band Patient awake and Patient confused    Reviewed: Allergy & Precautions, NPO status , Patient's Chart, lab work & pertinent test results  History of Anesthesia Complications Negative for: history of anesthetic complications  Airway Mallampati: II  TM Distance: >3 FB Neck ROM: Full    Dental  (+) Upper Dentures, Lower Dentures, Dental Advisory Given   Pulmonary COPD, Current Smoker and Patient abstained from smoking.,    - rhonchi + decreased breath sounds(-) wheezing      Cardiovascular negative cardio ROS Normal cardiovascular exam     Neuro/Psych negative neurological ROS  negative psych ROS   GI/Hepatic negative GI ROS, Neg liver ROS,   Endo/Other  negative endocrine ROS  Renal/GU negative Renal ROS   Prostate ca    Musculoskeletal avascular necrosis left hip   Abdominal   Peds  Hematology negative hematology ROS (+)   Anesthesia Other Findings Day of surgery medications reviewed with patient.  Reproductive/Obstetrics negative OB ROS                           Anesthesia Physical Anesthesia Plan  ASA: II  Anesthesia Plan: Spinal   Post-op Pain Management:    Induction:   PONV Risk Score and Plan: 1 and Treatment may vary due to age or medical condition, Ondansetron, Dexamethasone and Propofol infusion  Airway Management Planned: Natural Airway and Simple Face Mask  Additional Equipment: None  Intra-op Plan:   Post-operative Plan:   Informed Consent:   Plan Discussed with:   Anesthesia Plan Comments:         Anesthesia Quick Evaluation

## 2019-08-24 ENCOUNTER — Encounter (HOSPITAL_COMMUNITY)
Admission: RE | Disposition: A | Discharge status: Home Health | Payer: Self-pay | Source: Home / Self Care | Attending: Orthopaedic Surgery

## 2019-08-24 ENCOUNTER — Inpatient Hospital Stay (HOSPITAL_COMMUNITY): Payer: No Typology Code available for payment source

## 2019-08-24 ENCOUNTER — Inpatient Hospital Stay (HOSPITAL_COMMUNITY): Payer: No Typology Code available for payment source | Admitting: Anesthesiology

## 2019-08-24 ENCOUNTER — Encounter (HOSPITAL_COMMUNITY): Payer: Self-pay | Admitting: Orthopaedic Surgery

## 2019-08-24 ENCOUNTER — Inpatient Hospital Stay (HOSPITAL_COMMUNITY): Payer: No Typology Code available for payment source | Admitting: Physician Assistant

## 2019-08-24 ENCOUNTER — Inpatient Hospital Stay (HOSPITAL_COMMUNITY)
Admission: RE | Admit: 2019-08-24 | Discharge: 2019-08-26 | DRG: 470 | Disposition: A | Discharge status: Home Health | Payer: No Typology Code available for payment source | Attending: Orthopaedic Surgery | Admitting: Orthopaedic Surgery

## 2019-08-24 ENCOUNTER — Other Ambulatory Visit: Payer: Self-pay

## 2019-08-24 DIAGNOSIS — Z9181 History of falling: Secondary | ICD-10-CM | POA: Diagnosis not present

## 2019-08-24 DIAGNOSIS — F17210 Nicotine dependence, cigarettes, uncomplicated: Secondary | ICD-10-CM | POA: Diagnosis not present

## 2019-08-24 DIAGNOSIS — Z8546 Personal history of malignant neoplasm of prostate: Secondary | ICD-10-CM

## 2019-08-24 DIAGNOSIS — M879 Osteonecrosis, unspecified: Secondary | ICD-10-CM | POA: Diagnosis not present

## 2019-08-24 DIAGNOSIS — Z880 Allergy status to penicillin: Secondary | ICD-10-CM | POA: Diagnosis not present

## 2019-08-24 DIAGNOSIS — M87052 Idiopathic aseptic necrosis of left femur: Secondary | ICD-10-CM | POA: Diagnosis not present

## 2019-08-24 DIAGNOSIS — Z8701 Personal history of pneumonia (recurrent): Secondary | ICD-10-CM | POA: Diagnosis not present

## 2019-08-24 DIAGNOSIS — Z972 Presence of dental prosthetic device (complete) (partial): Secondary | ICD-10-CM

## 2019-08-24 DIAGNOSIS — Z96641 Presence of right artificial hip joint: Secondary | ICD-10-CM

## 2019-08-24 DIAGNOSIS — J449 Chronic obstructive pulmonary disease, unspecified: Secondary | ICD-10-CM | POA: Diagnosis present

## 2019-08-24 DIAGNOSIS — Z96642 Presence of left artificial hip joint: Secondary | ICD-10-CM

## 2019-08-24 DIAGNOSIS — Z419 Encounter for procedure for purposes other than remedying health state, unspecified: Secondary | ICD-10-CM

## 2019-08-24 HISTORY — PX: TOTAL HIP ARTHROPLASTY: SHX124

## 2019-08-24 SURGERY — ARTHROPLASTY, HIP, TOTAL, ANTERIOR APPROACH
Anesthesia: Spinal | Site: Hip | Laterality: Left

## 2019-08-24 MED ORDER — ASCORBIC ACID 500 MG PO TABS
1000.0000 mg | ORAL_TABLET | Freq: Every day | ORAL | Status: DC
  Administered 2019-08-24 – 2019-08-26 (×3): 1000 mg via ORAL
  Filled 2019-08-24 (×3): qty 2

## 2019-08-24 MED ORDER — DIPHENHYDRAMINE HCL 12.5 MG/5ML PO ELIX: 12.5000 mg | ORAL_SOLUTION | ORAL | Status: DC | PRN

## 2019-08-24 MED ORDER — METHOCARBAMOL 1000 MG/10ML IJ SOLN
500.0000 mg | Freq: Four times a day (QID) | INTRAVENOUS | Status: DC | PRN
  Filled 2019-08-24: qty 5

## 2019-08-24 MED ORDER — MENTHOL 3 MG MT LOZG: 1.0000 | LOZENGE | OROMUCOSAL | Status: DC | PRN

## 2019-08-24 MED ORDER — IPRATROPIUM-ALBUTEROL 0.5-2.5 (3) MG/3ML IN SOLN
RESPIRATORY_TRACT | Status: AC
  Filled 2019-08-24: qty 3

## 2019-08-24 MED ORDER — TRANEXAMIC ACID-NACL 1000-0.7 MG/100ML-% IV SOLN
1000.0000 mg | INTRAVENOUS | Status: AC
  Administered 2019-08-24: 1000 mg via INTRAVENOUS
  Filled 2019-08-24: qty 100

## 2019-08-24 MED ORDER — VITAMIN D 25 MCG (1000 UNIT) PO TABS
5000.0000 [IU] | ORAL_TABLET | Freq: Every day | ORAL | Status: DC
  Administered 2019-08-24 – 2019-08-26 (×3): 5000 [IU] via ORAL
  Filled 2019-08-24 (×6): qty 5

## 2019-08-24 MED ORDER — VITAMIN B-12 1000 MCG PO TABS
5000.0000 ug | ORAL_TABLET | Freq: Every day | ORAL | Status: DC
  Administered 2019-08-24 – 2019-08-26 (×3): 5000 ug via ORAL
  Filled 2019-08-24 (×3): qty 5

## 2019-08-24 MED ORDER — MIDAZOLAM HCL 2 MG/2ML IJ SOLN
INTRAMUSCULAR | Status: AC
  Filled 2019-08-24: qty 2

## 2019-08-24 MED ORDER — OXYCODONE HCL 5 MG PO TABS
ORAL_TABLET | ORAL | Status: AC
  Filled 2019-08-24: qty 1

## 2019-08-24 MED ORDER — DEXAMETHASONE SODIUM PHOSPHATE 10 MG/ML IJ SOLN
INTRAMUSCULAR | Status: DC | PRN
  Administered 2019-08-24: 10 mg via INTRAVENOUS

## 2019-08-24 MED ORDER — MOMETASONE FURO-FORMOTEROL FUM 200-5 MCG/ACT IN AERO
2.0000 | INHALATION_SPRAY | Freq: Two times a day (BID) | RESPIRATORY_TRACT | Status: DC
  Administered 2019-08-24 – 2019-08-25 (×2): 2 via RESPIRATORY_TRACT
  Filled 2019-08-24: qty 8.8

## 2019-08-24 MED ORDER — TIOTROPIUM BROMIDE MONOHYDRATE 2.5 MCG/ACT IN AERS: 1.0000 | INHALATION_SPRAY | Freq: Two times a day (BID) | RESPIRATORY_TRACT | Status: DC

## 2019-08-24 MED ORDER — SODIUM CHLORIDE 0.9 % IV SOLN: INTRAVENOUS | Status: DC

## 2019-08-24 MED ORDER — POVIDONE-IODINE 10 % EX SWAB
2.0000 "application " | Freq: Once | CUTANEOUS | Status: AC
  Administered 2019-08-24: 2 via TOPICAL

## 2019-08-24 MED ORDER — ALUM & MAG HYDROXIDE-SIMETH 200-200-20 MG/5ML PO SUSP
30.0000 mL | ORAL | Status: DC | PRN
  Administered 2019-08-25: 30 mL via ORAL
  Filled 2019-08-24: qty 30

## 2019-08-24 MED ORDER — ONDANSETRON HCL 4 MG/2ML IJ SOLN: 4.0000 mg | Freq: Four times a day (QID) | INTRAMUSCULAR | Status: DC | PRN

## 2019-08-24 MED ORDER — 0.9 % SODIUM CHLORIDE (POUR BTL) OPTIME
TOPICAL | Status: DC | PRN
  Administered 2019-08-24: 12:00:00 1000 mL

## 2019-08-24 MED ORDER — PHENOL 1.4 % MT LIQD: 1.0000 | OROMUCOSAL | Status: DC | PRN

## 2019-08-24 MED ORDER — CHLORHEXIDINE GLUCONATE 4 % EX LIQD: 60.0000 mL | Freq: Once | CUTANEOUS | Status: DC

## 2019-08-24 MED ORDER — IPRATROPIUM-ALBUTEROL 0.5-2.5 (3) MG/3ML IN SOLN
3.0000 mL | RESPIRATORY_TRACT | Status: AC
  Administered 2019-08-24: 3 mL via RESPIRATORY_TRACT

## 2019-08-24 MED ORDER — SODIUM CHLORIDE 0.9 % IR SOLN
Status: DC | PRN
  Administered 2019-08-24: 3000 mL

## 2019-08-24 MED ORDER — PROMETHAZINE HCL 25 MG/ML IJ SOLN: 6.2500 mg | INTRAMUSCULAR | Status: DC | PRN

## 2019-08-24 MED ORDER — PROPOFOL 10 MG/ML IV BOLUS
INTRAVENOUS | Status: AC
  Filled 2019-08-24: qty 20

## 2019-08-24 MED ORDER — UMECLIDINIUM BROMIDE 62.5 MCG/INH IN AEPB
1.0000 | INHALATION_SPRAY | Freq: Every day | RESPIRATORY_TRACT | Status: DC
  Filled 2019-08-24: qty 7

## 2019-08-24 MED ORDER — METOCLOPRAMIDE HCL 5 MG PO TABS: 5.0000 mg | ORAL_TABLET | Freq: Three times a day (TID) | ORAL | Status: DC | PRN

## 2019-08-24 MED ORDER — CALCIUM CARBONATE-VITAMIN D 500-200 MG-UNIT PO TABS
1.0000 | ORAL_TABLET | Freq: Two times a day (BID) | ORAL | Status: DC
  Administered 2019-08-24 – 2019-08-26 (×4): 1 via ORAL
  Filled 2019-08-24 (×4): qty 1

## 2019-08-24 MED ORDER — SELENIUM 200 MCG PO TABS
200.0000 ug | ORAL_TABLET | Freq: Every day | ORAL | Status: DC
  Administered 2019-08-25 – 2019-08-26 (×2): 200 ug via ORAL
  Filled 2019-08-24 (×3): qty 1

## 2019-08-24 MED ORDER — FENTANYL CITRATE (PF) 100 MCG/2ML IJ SOLN
25.0000 ug | INTRAMUSCULAR | Status: DC | PRN
  Administered 2019-08-24: 50 ug via INTRAVENOUS

## 2019-08-24 MED ORDER — OXYCODONE HCL 5 MG PO TABS
5.0000 mg | ORAL_TABLET | ORAL | Status: DC | PRN
  Administered 2019-08-24 – 2019-08-26 (×2): 5 mg via ORAL
  Filled 2019-08-24: qty 1
  Filled 2019-08-24: qty 2
  Filled 2019-08-24: qty 1

## 2019-08-24 MED ORDER — METHOCARBAMOL 500 MG PO TABS
500.0000 mg | ORAL_TABLET | Freq: Four times a day (QID) | ORAL | Status: DC | PRN
  Administered 2019-08-24 – 2019-08-26 (×4): 500 mg via ORAL
  Filled 2019-08-24 (×4): qty 1

## 2019-08-24 MED ORDER — HYDROMORPHONE HCL 1 MG/ML IJ SOLN
0.5000 mg | INTRAMUSCULAR | Status: DC | PRN
  Administered 2019-08-25 (×3): 1 mg via INTRAVENOUS
  Filled 2019-08-24 (×3): qty 1

## 2019-08-24 MED ORDER — ACETAMINOPHEN 325 MG PO TABS: 325.0000 mg | ORAL_TABLET | Freq: Four times a day (QID) | ORAL | Status: DC | PRN

## 2019-08-24 MED ORDER — MSM 1000 MG PO CAPS: 1000.0000 mg | ORAL_CAPSULE | Freq: Two times a day (BID) | ORAL | Status: DC

## 2019-08-24 MED ORDER — OXYCODONE HCL 5 MG PO TABS
10.0000 mg | ORAL_TABLET | ORAL | Status: DC | PRN
  Administered 2019-08-24: 10 mg via ORAL
  Administered 2019-08-25: 15 mg via ORAL
  Administered 2019-08-25: 10 mg via ORAL
  Administered 2019-08-25 – 2019-08-26 (×3): 15 mg via ORAL
  Filled 2019-08-24 (×5): qty 3

## 2019-08-24 MED ORDER — ZINC SULFATE 220 (50 ZN) MG PO CAPS
220.0000 mg | ORAL_CAPSULE | Freq: Every day | ORAL | Status: DC
  Administered 2019-08-24 – 2019-08-26 (×3): 220 mg via ORAL
  Filled 2019-08-24 (×3): qty 1

## 2019-08-24 MED ORDER — ACETAMINOPHEN 500 MG PO TABS
1000.0000 mg | ORAL_TABLET | Freq: Once | ORAL | Status: AC
  Administered 2019-08-24: 1000 mg via ORAL
  Filled 2019-08-24: qty 2

## 2019-08-24 MED ORDER — ASPIRIN 81 MG PO CHEW
81.0000 mg | CHEWABLE_TABLET | Freq: Two times a day (BID) | ORAL | Status: DC
  Administered 2019-08-24 – 2019-08-26 (×4): 81 mg via ORAL
  Filled 2019-08-24 (×4): qty 1

## 2019-08-24 MED ORDER — MIDAZOLAM HCL 5 MG/5ML IJ SOLN
INTRAMUSCULAR | Status: DC | PRN
  Administered 2019-08-24: 2 mg via INTRAVENOUS

## 2019-08-24 MED ORDER — DEXAMETHASONE SODIUM PHOSPHATE 10 MG/ML IJ SOLN
INTRAMUSCULAR | Status: AC
  Filled 2019-08-24: qty 1

## 2019-08-24 MED ORDER — PHENYLEPHRINE HCL-NACL 10-0.9 MG/250ML-% IV SOLN
INTRAVENOUS | Status: DC | PRN
  Administered 2019-08-24: 50 ug/min via INTRAVENOUS

## 2019-08-24 MED ORDER — PROPOFOL 500 MG/50ML IV EMUL
INTRAVENOUS | Status: DC | PRN
  Administered 2019-08-24: 75 ug/kg/min via INTRAVENOUS
  Administered 2019-08-24: 30 mg via INTRAVENOUS

## 2019-08-24 MED ORDER — ONDANSETRON HCL 4 MG PO TABS: 4.0000 mg | ORAL_TABLET | Freq: Four times a day (QID) | ORAL | Status: DC | PRN

## 2019-08-24 MED ORDER — FENTANYL CITRATE (PF) 250 MCG/5ML IJ SOLN
INTRAMUSCULAR | Status: AC
  Filled 2019-08-24: qty 5

## 2019-08-24 MED ORDER — DOCUSATE SODIUM 100 MG PO CAPS
100.0000 mg | ORAL_CAPSULE | Freq: Two times a day (BID) | ORAL | Status: DC
  Administered 2019-08-24 – 2019-08-26 (×4): 100 mg via ORAL
  Filled 2019-08-24 (×4): qty 1

## 2019-08-24 MED ORDER — OXYCODONE HCL 5 MG PO TABS
5.0000 mg | ORAL_TABLET | Freq: Once | ORAL | Status: AC | PRN
  Administered 2019-08-24: 5 mg via ORAL

## 2019-08-24 MED ORDER — METOCLOPRAMIDE HCL 5 MG/ML IJ SOLN: 5.0000 mg | Freq: Three times a day (TID) | INTRAMUSCULAR | Status: DC | PRN

## 2019-08-24 MED ORDER — FENTANYL CITRATE (PF) 100 MCG/2ML IJ SOLN
INTRAMUSCULAR | Status: AC
  Filled 2019-08-24: qty 2

## 2019-08-24 MED ORDER — OXYCODONE HCL 5 MG/5ML PO SOLN: 5.0000 mg | Freq: Once | ORAL | Status: AC | PRN

## 2019-08-24 MED ORDER — CLINDAMYCIN PHOSPHATE 900 MG/50ML IV SOLN
900.0000 mg | INTRAVENOUS | Status: AC
  Administered 2019-08-24: 900 mg via INTRAVENOUS
  Filled 2019-08-24: qty 50

## 2019-08-24 MED ORDER — FENTANYL CITRATE (PF) 100 MCG/2ML IJ SOLN
INTRAMUSCULAR | Status: DC | PRN
  Administered 2019-08-24: 50 ug via INTRAVENOUS

## 2019-08-24 MED ORDER — PANTOPRAZOLE SODIUM 40 MG PO TBEC
40.0000 mg | DELAYED_RELEASE_TABLET | Freq: Every day | ORAL | Status: DC
  Administered 2019-08-24 – 2019-08-26 (×3): 40 mg via ORAL
  Filled 2019-08-24 (×3): qty 1

## 2019-08-24 MED ORDER — LIDOCAINE 2% (20 MG/ML) 5 ML SYRINGE
INTRAMUSCULAR | Status: AC
  Filled 2019-08-24: qty 5

## 2019-08-24 MED ORDER — ALBUTEROL SULFATE (2.5 MG/3ML) 0.083% IN NEBU
3.0000 mL | INHALATION_SOLUTION | Freq: Four times a day (QID) | RESPIRATORY_TRACT | Status: DC | PRN
  Administered 2019-08-25 (×2): 3 mL via RESPIRATORY_TRACT
  Filled 2019-08-24 (×2): qty 3

## 2019-08-24 MED ORDER — ONDANSETRON HCL 4 MG/2ML IJ SOLN
INTRAMUSCULAR | Status: AC
  Filled 2019-08-24: qty 2

## 2019-08-24 MED ORDER — CLINDAMYCIN PHOSPHATE 600 MG/50ML IV SOLN
600.0000 mg | Freq: Four times a day (QID) | INTRAVENOUS | Status: AC
  Administered 2019-08-24 (×2): 600 mg via INTRAVENOUS
  Filled 2019-08-24 (×2): qty 50

## 2019-08-24 MED ORDER — LACTATED RINGERS IV SOLN: INTRAVENOUS | Status: DC

## 2019-08-24 MED ORDER — BUPIVACAINE IN DEXTROSE 0.75-8.25 % IT SOLN
INTRATHECAL | Status: DC | PRN
  Administered 2019-08-24: 1.8 mL via INTRATHECAL

## 2019-08-24 SURGICAL SUPPLY — 63 items
ACETAB CUP W GRIPTION 54MM (Plate) ×1 IMPLANT
ACETAB CUP W/GRIPTION 54 (Plate) ×2 IMPLANT
APL SKNCLS STERI-STRIP NONHPOA (GAUZE/BANDAGES/DRESSINGS) ×1
ARTICULEZE HEAD (Hips) ×3 IMPLANT
BENZOIN TINCTURE PRP APPL 2/3 (GAUZE/BANDAGES/DRESSINGS) ×3 IMPLANT
BLADE CLIPPER SURG (BLADE) IMPLANT
BLADE SAW SGTL 18X1.27X75 (BLADE) ×2 IMPLANT
BLADE SAW SGTL 18X1.27X75MM (BLADE) ×1
CLOSURE WOUND 1/2 X4 (GAUZE/BANDAGES/DRESSINGS) ×2
COVER SURGICAL LIGHT HANDLE (MISCELLANEOUS) ×3 IMPLANT
COVER WAND RF STERILE (DRAPES) ×3 IMPLANT
CUP ACETAB W/GRIPTION 54 (Plate) IMPLANT
DRAPE C-ARM 42X72 X-RAY (DRAPES) ×3 IMPLANT
DRAPE STERI IOBAN 125X83 (DRAPES) ×3 IMPLANT
DRAPE U-SHAPE 47X51 STRL (DRAPES) ×9 IMPLANT
DRESSING AQUACEL AG SP 3.5X10 (GAUZE/BANDAGES/DRESSINGS) IMPLANT
DRSG AQUACEL AG ADV 3.5X10 (GAUZE/BANDAGES/DRESSINGS) ×3 IMPLANT
DRSG AQUACEL AG SP 3.5X10 (GAUZE/BANDAGES/DRESSINGS) ×3
DURAPREP 26ML APPLICATOR (WOUND CARE) ×3 IMPLANT
ELECT BLADE 4.0 EZ CLEAN MEGAD (MISCELLANEOUS) ×3
ELECT BLADE 6.5 EXT (BLADE) IMPLANT
ELECT REM PT RETURN 9FT ADLT (ELECTROSURGICAL) ×3
ELECTRODE BLDE 4.0 EZ CLN MEGD (MISCELLANEOUS) ×1 IMPLANT
ELECTRODE REM PT RTRN 9FT ADLT (ELECTROSURGICAL) ×1 IMPLANT
FACESHIELD WRAPAROUND (MASK) ×6 IMPLANT
FACESHIELD WRAPAROUND OR TEAM (MASK) ×2 IMPLANT
GLOVE BIOGEL PI IND STRL 8 (GLOVE) ×2 IMPLANT
GLOVE BIOGEL PI INDICATOR 8 (GLOVE) ×4
GLOVE ECLIPSE 8.0 STRL XLNG CF (GLOVE) ×3 IMPLANT
GLOVE ORTHO TXT STRL SZ7.5 (GLOVE) ×6 IMPLANT
GOWN STRL REUS W/ TWL LRG LVL3 (GOWN DISPOSABLE) ×2 IMPLANT
GOWN STRL REUS W/ TWL XL LVL3 (GOWN DISPOSABLE) ×2 IMPLANT
GOWN STRL REUS W/TWL LRG LVL3 (GOWN DISPOSABLE) ×6
GOWN STRL REUS W/TWL XL LVL3 (GOWN DISPOSABLE) ×6
HANDPIECE INTERPULSE COAX TIP (DISPOSABLE) ×3
HEAD ARTICULEZE (Hips) IMPLANT
HEAD M SROM 36MM PLUS 1.5 (Hips) IMPLANT
KIT BASIN OR (CUSTOM PROCEDURE TRAY) ×3 IMPLANT
KIT TURNOVER KIT B (KITS) ×3 IMPLANT
LINER NEUTRAL 54X36MM PLUS 4 (Hips) ×2 IMPLANT
MANIFOLD NEPTUNE II (INSTRUMENTS) ×3 IMPLANT
NS IRRIG 1000ML POUR BTL (IV SOLUTION) ×3 IMPLANT
PACK TOTAL JOINT (CUSTOM PROCEDURE TRAY) ×3 IMPLANT
PAD ARMBOARD 7.5X6 YLW CONV (MISCELLANEOUS) ×3 IMPLANT
SET HNDPC FAN SPRY TIP SCT (DISPOSABLE) ×1 IMPLANT
SROM M HEAD 36MM PLUS 1.5 (Hips) ×3 IMPLANT
STAPLER VISISTAT 35W (STAPLE) IMPLANT
STEM FEM ACTIS HIGH SZ7 (Stem) ×2 IMPLANT
STRIP CLOSURE SKIN 1/2X4 (GAUZE/BANDAGES/DRESSINGS) ×4 IMPLANT
SUT ETHIBOND NAB CT1 #1 30IN (SUTURE) ×3 IMPLANT
SUT MNCRL AB 4-0 PS2 18 (SUTURE) IMPLANT
SUT VIC AB 0 CT1 27 (SUTURE) ×3
SUT VIC AB 0 CT1 27XBRD ANBCTR (SUTURE) ×1 IMPLANT
SUT VIC AB 1 CT1 27 (SUTURE) ×3
SUT VIC AB 1 CT1 27XBRD ANBCTR (SUTURE) ×1 IMPLANT
SUT VIC AB 2-0 CT1 27 (SUTURE) ×3
SUT VIC AB 2-0 CT1 TAPERPNT 27 (SUTURE) ×1 IMPLANT
TOWEL GREEN STERILE (TOWEL DISPOSABLE) ×3 IMPLANT
TOWEL GREEN STERILE FF (TOWEL DISPOSABLE) ×3 IMPLANT
TRAY CATH 16FR W/PLASTIC CATH (SET/KITS/TRAYS/PACK) IMPLANT
TRAY FOLEY W/BAG SLVR 16FR (SET/KITS/TRAYS/PACK)
TRAY FOLEY W/BAG SLVR 16FR ST (SET/KITS/TRAYS/PACK) IMPLANT
WATER STERILE IRR 1000ML POUR (IV SOLUTION) ×6 IMPLANT

## 2019-08-24 NOTE — H&P (Signed)
TOTAL HIP ADMISSION H&P  Patient is admitted for left total hip arthroplasty.  Subjective:  Chief Complaint: left hip pain  HPI: Steve Riley, 69 y.o. male, has a history of pain and functional disability in the left hip(s) due to avascular necrosis and patient has failed non-surgical conservative treatments for greater than 12 weeks to include NSAID's and/or analgesics, flexibility and strengthening excercises, use of assistive devices and activity modification.  Onset of symptoms was abrupt starting 1 years ago with rapidlly worsening course since that time.The patient noted no past surgery on the left hip(s).  Patient currently rates pain in the left hip at 9 out of 10 with activity. Patient has night pain, worsening of pain with activity and weight bearing, trendelenberg gait, pain that interfers with activities of daily living and pain with passive range of motion. Patient has evidence of subchondral cysts and joint space narrowing by imaging studies. This condition presents safety issues increasing the risk of falls.  There is no current active infection.  Patient Active Problem List   Diagnosis Date Noted  . Avascular necrosis of hip, left ([place]) 04/06/2019  . 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 COPD exacerbation ([place]) 03/06/2018  . 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 Malignant neoplasm of prostate ([place]) 03/12/2017   Past Medical History:  Diagnosis Date  . COPD (chronic obstructive pulmonary disease) ([place])   . Pneumonia   . Prostate cancer [facility])     Past Surgical History:  Procedure Laterality Date  . BACK SURGERY    . HAND SURGERY    . PROSTATE BIOPSY    . TOTAL HIP ARTHROPLASTY Right 04/06/2019   Procedure: RIGHT TOTAL HIP ARTHROPLASTY ANTERIOR APPROACH;  Surgeon: Mcarthur Rossetti, MD;  Location: [facility];  Service: Orthopedics;  Laterality: Right;    Current Facility-Administered Medications  Medication Dose Route Frequency Provider Last Rate Last  Admin  . acetaminophen (TYLENOL) tablet 1,000 mg  1,000 mg Oral Once Brennan Bailey, MD      . chlorhexidine (HIBICLENS) 4 % liquid 4 application  60 mL Topical Once Erskine Emery W, PA-C      . clindamycin (CLEOCIN) IVPB 900 mg  900 mg Intravenous On Call to OR Pete Pelt, PA-C      . ipratropium-albuterol (DUONEB) 0.5-2.5 (3) MG/3ML nebulizer solution           . povidone-iodine 10 % swab 2 application  2 application Topical Once Pete Pelt, PA-C      . tranexamic acid (CYKLOKAPRON) IVPB 1,000 mg  1,000 mg Intravenous To OR Pete Pelt, PA-C       Allergies  Allergen Reactions  . Penicillin G Other (See Comments)    "passed out"/tongue swelling/childhood allergy Did it involve swelling of the face/tongue/throat, SOB, or low BP?    #  #  YES  #  #  Did it involve sudden or severe rash/hives, skin peeling, or any reaction on the inside of your mouth or nose? No Did you need to seek medical attention at a hospital or doctor's office? No When did it last happen?childhood allergy If all above answers are "NO", may proceed with cephalosporin use.     Social History   Tobacco Use  . Smoking status: Current Some Day Smoker    Packs/day: 3.00    Years: 54.00    Pack years: 162.00    Types: Cigarettes    Last attempt to quit: 07/11/2016    Years since  quitting: 3.1  . Smokeless tobacco: Never Used  . Tobacco comment: occassionally 1-3 per week  Substance Use Topics  . Alcohol use: No    Family History  Problem Relation Age of Onset  . Cancer Neg Hx      Review of Systems  All other systems reviewed and are negative.   Objective:  Physical Exam  Constitutional: He is oriented to person, place, and time. He appears well-developed and well-nourished.  HENT:  Head: Normocephalic and atraumatic.  Eyes: Pupils are equal, round, and reactive to light. EOM are normal.  Cardiovascular: Normal rate.  Respiratory: Effort normal.  GI: Soft.  Musculoskeletal:      Cervical back: Normal range of motion and neck supple.     Left hip: Tenderness and bony tenderness present. Decreased range of motion. Decreased strength.  Neurological: He is alert and oriented to person, place, and time.  Skin: Skin is warm and dry.  Psychiatric: He has a normal mood and affect.    Vital signs in last 24 hours: Temp:  [98.2 F (36.8 C)] 98.2 F (36.8 C) (03/16 1021) Pulse Rate:  [78-82] 78 (03/16 1040) Resp:  [23] 23 (03/16 1021) BP: (134-155)/(73-94) 150/73 (03/16 1040) SpO2:  [93 %-100 %] 93 % (03/16 1040) Weight:  [86.6 kg] 86.6 kg (03/16 1021)  Labs:   Estimated body mass index is 29.04 kg/m as calculated from the following:   Height as of this encounter: 5\' 8"  (1.727 m).   Weight as of this encounter: 86.6 kg.   Imaging Review Plain radiographs demonstrate severe avascular necrosis of the left hip(s). The bone quality appears to be good for age and reported activity level.      Assessment/Plan:  End stage AVN, left hip(s)  The patient history, physical examination, clinical judgement of the provider and imaging studies are consistent with end stage avascular necrosis of the left hip(s) and total hip arthroplasty is deemed medically necessary. The treatment options including medical management, injection therapy, arthroscopy and arthroplasty were discussed at length. The risks and benefits of total hip arthroplasty were presented and reviewed. The risks due to aseptic loosening, infection, stiffness, dislocation/subluxation,  thromboembolic complications and other imponderables were discussed.  The patient acknowledged the explanation agreed to proceed with the plan and consent was signed. Patient is being admitted for inpatient treatment for surgery, pain control, PT, OT, prophylactic antibiotics, VTE prophylaxis, progressive ambulation and ADL's and discharge planning.The patient is planning to be discharged home with home health services

## 2019-08-24 NOTE — Anesthesia Procedure Notes (Signed)
Procedure Name: MAC Date/Time: 08/24/2019 11:50 AM Performed by: Jenne Campus, CRNA Pre-anesthesia Checklist: Patient identified, Emergency Drugs available, Suction available and Patient being monitored Oxygen Delivery Method: Simple face mask

## 2019-08-24 NOTE — Op Note (Signed)
NAME: Steve Riley, [place] MEDICAL RECORD [id number] ACCOUNT [id number] DATE OF BIRTH:[birth year] FACILITY: [facility] LOCATION: [facility] PHYSICIAN:Nikolette Reindl Kerry [place], MD  OPERATIVE REPORT  DATE OF PROCEDURE:  08/24/2019  PREOPERATIVE DIAGNOSIS:  End-stage avascular necrosis, left hip.  POSTOPERATIVE DIAGNOSIS:  End-stage avascular necrosis, left hip.  PROCEDURE:  Left total hip arthroplasty through direct anterior approach.  IMPLANTS:  DePuy Sector Gription acetabular component size 54, size 36+4 neutral polyethylene liner, size 7 Actis femoral component with high offset, size 36+5 metal hip ball.  SURGEON:  Lind Guest.  Ninfa Linden, MD  ASSISTANT:  Erskine Emery, PA-C  ANESTHESIA:  Spinal.  ANTIBIOTICS:  900 mg IV clindamycin.  ESTIMATED BLOOD LOSS:  100-150 mL.  COMPLICATIONS:  None.  INDICATIONS:  The patient is well known to me.  He is a 69 year old active individual with bilateral hip avascular necrosis.  We actually replaced his right hip through a direct anterior approach in late October of last year.  His left hip pain has also  been debilitating.  His plain films and MRI consistent with severe avascular necrosis of that hip.  At this point, with the hip pain being daily and it detrimentally affecting his mobility, his quality of life and his activities of daily living, and  given the success of his recent right total hip arthroplasty, he wishes to proceed with this on the left side.  We had a long and thorough discussion about the risks and benefits of the surgery.  We talked about the risk of acute blood loss anemia, nerve  or vessel injury, fracture, infection, dislocation, DVT and implant failure.  We talked about the goals being decreased pain, improved mobility and overall improved quality of life.  DESCRIPTION OF PROCEDURE:  After informed consent was obtained and appropriate left hip was marked, he was brought to the operating room and sat up on a stretcher where spinal  anesthesia was obtained.  He was placed in supine position on a stretcher.   Foley catheter was placed.  We assessed his leg lengths and found them to be equal.  Traction boots were placed on both his feet.  Next, he was placed supine on the [place] fracture table, the perineal post in place and both legs in line skeletal traction  device and no traction applied.  His left operative hip was prepped and draped with DuraPrep and sterile drapes.  A time-out was called.  He was identified, correct patient, correct left hip.  I then did a pinch test to make sure adequate spinal  anesthesia was obtained.  We then made an incision just inferior and posterior to the anterior defect spine and carried this slightly obliquely down the leg.  We dissected down to tensor fascia lata muscle.  Tensor fascia was incised longitudinally to  proceed with direct anterior approach to the hip.  I identified and cauterized the circumflex vessels.  I then identified the hip capsule, opened up the hip capsule in an L-type format, finding moderate joint effusion.  Cobra retractors were then placed  around the medial and lateral femoral neck and I made our femoral neck cut with an oscillating saw just proximal to the lesser trochanter and completed this with an osteotome.  We placed a corkscrew guide in the femoral head and removed the femoral head  in its entirety and found findings consistent with avascular necrosis.  I then placed a bent Hohmann over the medial acetabular rim and removed remnants of acetabular labrum and other debris.  I then began reaming  under direct visualization from a size  43 reamer in stepwise increments up to a size 53, with all reamers placed under direct visualization and the last reamer placed under direct fluoroscopy, so we could obtain our depth of reaming, our inclination and our anteversion.  I then placed the  real DePuy Sector Gription acetabular component size 54.  We went with a 36+4 polyethylene  liner following that.  Attention was then turned to the femur.  With the leg externally rotated 120 degrees, extended and adducted, we were able to place a Mueller  retractor medially and a Hohmann retractor behind the greater trochanter.  We released the lateral joint capsule and used a box-cutting osteotome to enter the femoral canal and a rongeur to lateralize.  We then began broaching using the Actis broaching  system from a size zero going up to a size 7.  With a size 7 in place, we trialed first a standard offset femoral neck and a 36+1.5 hip ball, reduced this in the acetabulum.  It was definitely not stable, so we knew we needed more high offset and leg  length.  We dislocated the hip and removed the trial components.  I went with the real Actis femoral component size 7 with high offset.  We went with a 36+1.5 metal hip ball, reduced this in the acetabulum and I was definitely does not pleased with this  final stability.  We dislocated the hip and removed the trial components.  I went up hip ball size to a 36+5 metal hip ball and again reduced this in the acetabulum.  At that point, I was definitely pleased with leg length, offset, range of motion and  stability assessed mechanically and radiographically.  We then irrigated the soft tissue with normal saline solution using pulsatile lavage.  We closed the joint capsule with interrupted #1 Ethibond suture, followed by closing the tensor fascia with #1  Vicryl.  Zero Vicryl was used to close deep tissue, 2-0 Vicryl was used to close the subcutaneous tissue, and staples were used to reapproximate the skin.  Xeroform and Aquacel dressing was applied.  He was taken off the [place] table and taken to the  recovery room in stable condition.  All final counts were correct.  There were no complications noted.  Of note, Benita Stabile, PA-C, assisted during the entire case and assistance was crucial for facilitating all aspects of this case.  VN/NUANCE  D:08/24/2019  T:08/24/2019 JOB:[id number]

## 2019-08-24 NOTE — Progress Notes (Signed)
Orthopedic Tech Progress Note Patient Details:  Steve Riley [birth year] [id number]  Ortho Devices Ortho Device/Splint Location: Trapeze bar Ortho Device/Splint Interventions: Application       [place] Pink 08/24/2019, 6:38 PM

## 2019-08-24 NOTE — Anesthesia Postprocedure Evaluation (Signed)
Anesthesia Post Note  Patient: Steve Riley  Procedure(s) Performed: LEFT TOTAL HIP ARTHROPLASTY ANTERIOR APPROACH (Left Hip)     Patient location during evaluation: PACU Anesthesia Type: Spinal Level of consciousness: awake and alert and oriented Pain management: pain level controlled Vital Signs Assessment: post-procedure vital signs reviewed and stable Respiratory status: spontaneous breathing, nonlabored ventilation and respiratory function stable Cardiovascular status: blood pressure returned to baseline Postop Assessment: no apparent nausea or vomiting, spinal receding, no headache and no backache Anesthetic complications: no    Last Vitals:  Vitals:   08/24/19 1500 08/24/19 1515  BP: 121/68 128/70  Pulse: 64 72  Resp: 14 15  Temp:    SpO2: 92% 92%    Last Pain:  Vitals:   08/24/19 1515  TempSrc:   PainSc: Major

## 2019-08-24 NOTE — Brief Op Note (Signed)
08/24/2019  1:12 PM  PATIENT:  Steve Riley  69 y.o. male  PRE-OPERATIVE DIAGNOSIS:  avascular necrosis left hip  POST-OPERATIVE DIAGNOSIS:  avascular necrosis left hip  PROCEDURE:  Procedure(s): LEFT TOTAL HIP ARTHROPLASTY ANTERIOR APPROACH (Left)  SURGEON:  Surgeon(s) and Role:    Mcarthur Rossetti, MD - Primary  PHYSICIAN ASSISTANT: Benita Stabile, PA-C  ANESTHESIA:   spinal  EBL:  150 mL   COUNTS:  YES  DICTATION: .Other Dictation: Dictation Number [id number]  PLAN OF CARE: Admit for overnight observation  PATIENT DISPOSITION:  PACU - hemodynamically stable.   Delay start of Pharmacological VTE agent (>24hrs) due to surgical blood loss or risk of bleeding: no

## 2019-08-24 NOTE — Transfer of Care (Signed)
Immediate Anesthesia Transfer of Care Note  Patient: Steve Riley  Procedure(s) Performed: LEFT TOTAL HIP ARTHROPLASTY ANTERIOR APPROACH (Left Hip)  Patient Location: PACU  Anesthesia Type:MAC and Spinal  Level of Consciousness: awake, alert , oriented and patient cooperative  Airway & Oxygen Therapy: Patient Spontanous Breathing and Patient connected to face mask oxygen  Post-op Assessment: Report given to RN and Post -op Vital signs reviewed and stable  Post vital signs: Reviewed  Last Vitals:  Vitals Value Taken Time  BP    Temp    Pulse    Resp    SpO2      Last Pain:  Vitals:   08/24/19 1021  TempSrc: Oral  PainSc: 0-No pain      Patients Stated Pain Goal: 3 (79/49/97 1820)  Complications: No apparent anesthesia complications

## 2019-08-24 NOTE — Progress Notes (Signed)
Patient appears more relaxed post-duoneb tx/ no distress noted at this time. Dr. Daiva Huge at bedside.

## 2019-08-24 NOTE — Anesthesia Procedure Notes (Signed)
Spinal  Patient location during procedure: OR Start time: 08/24/2019 11:50 AM End time: 08/24/2019 11:52 AM Staffing Performed: anesthesiologist  Anesthesiologist: Brennan Bailey, MD Preanesthetic Checklist Completed: patient identified, IV checked, risks and benefits discussed, surgical consent, monitors and equipment checked, pre-op evaluation and timeout performed Spinal Block Patient position: sitting Prep: DuraPrep and site prepped and draped Patient monitoring: continuous pulse ox, blood pressure and heart rate Approach: midline Location: L3-4 Injection technique: single-shot Needle Needle type: Pencan and Whitacre  Needle gauge: 22 G Needle length: 9 cm Additional Notes Risks, benefits, and alternative discussed. Patient gave consent to procedure. Prepped and draped in sitting position. Patient sedated but responsive to voice. Clear CSF obtained after one needle pass. Positive terminal aspiration. No pain or paraesthesias with injection. Patient tolerated procedure well. Vital signs stable. Tawny Asal, MD

## 2019-08-24 NOTE — Plan of Care (Signed)
  Problem: Education: Goal: Knowledge of General Education information will improve Description: Including pain rating scale, medication(s)/side effects and non-pharmacologic comfort measures Outcome: Progressing   Problem: Pain Managment: Goal: General experience of comfort will improve Outcome: Progressing   Problem: Safety: Goal: Ability to remain free from injury will improve Outcome: Progressing   

## 2019-08-24 NOTE — Plan of Care (Signed)
  Problem: Skin Integrity: Goal: Risk for impaired skin integrity will decrease Outcome: Progressing   Problem: Safety: Goal: Ability to remain free from injury will improve Outcome: Progressing   Problem: Elimination: Goal: Will not experience complications related to bowel motility Outcome: Progressing   Problem: Activity: Goal: Risk for activity intolerance will decrease Outcome: Progressing   Problem: Clinical Measurements: Goal: Ability to maintain clinical measurements within normal limits will improve Outcome: Progressing   Problem: Education: Goal: Knowledge of General Education information will improve Description: Including pain rating scale, medication(s)/side effects and non-pharmacologic comfort measures Outcome: Progressing

## 2019-08-24 NOTE — Progress Notes (Addendum)
Patient noted to have increased work of breathing at rest. Patient denies chest pain. SPO2 in 90s while on RA, placed on 2LO2 for comfort. Per patient, his work of breathing started over the weekend, but had not notified any medical personnel. Patient states he used to be on 2L O2 up until February 2021 but "was taken away by the [place]" because he "no longer needed it". Dr. Daiva Huge notified. Verbal order received for Duoneb tx.    08/24/19 1021  Vitals  Temp 98.2 F (36.8 C)  Temp Source Oral  Pulse Rate 82  Pulse Rate Source Monitor;Right  Resp (!) 23  BP 134/84  SpO2 100 %  Oxygen Therapy  O2 Device Nasal Cannula  O2 Flow Rate (L/min) 2 L/min  Patient Activity (if Appropriate) In bed  Pain Assessment  Pain Scale 0-10  Pain Score 0  Patients Stated Pain Goal 3  LOC  Level of Consciousness Alert  Orientation Level Oriented X4

## 2019-08-25 ENCOUNTER — Encounter (HOSPITAL_COMMUNITY): Payer: Self-pay | Admitting: Orthopaedic Surgery

## 2019-08-25 LAB — BASIC METABOLIC PANEL
Anion gap: 10 (ref 5–15)
BUN: 10 mg/dL (ref 8–23)
CO2: 24 mmol/L (ref 22–32)
Calcium: 9.5 mg/dL (ref 8.9–10.3)
Chloride: 100 mmol/L (ref 98–111)
Creatinine, Ser: 0.81 mg/dL (ref 0.61–1.24)
GFR calc Af Amer: >60 mL/min (ref >60)
GFR calc non Af Amer: >60 mL/min (ref >60)
Glucose, Bld: 160 mg/dL — ABNORMAL HIGH (ref 70–99)
Potassium: 4.5 mmol/L (ref 3.5–5.1)
Sodium: 134 mmol/L — ABNORMAL LOW (ref 135–145)

## 2019-08-25 LAB — CBC
HCT: 41.9 % (ref 39.0–52.0)
Hemoglobin: 13.7 g/dL (ref 13.0–17.0)
MCH: 28.6 pg (ref 26.0–34.0)
MCHC: 32.7 g/dL (ref 30.0–36.0)
MCV: 87.5 fL (ref 80.0–100.0)
Platelets: 293 10*3/uL (ref 150–400)
RBC: 4.79 MIL/uL (ref 4.22–5.81)
RDW: 12.7 % (ref 11.5–15.5)
WBC: 12.9 10*3/uL — ABNORMAL HIGH (ref 4.0–10.5)
nRBC: 0 % (ref 0.0–0.2)

## 2019-08-25 MED ORDER — METHOCARBAMOL 500 MG PO TABS: 500.0000 mg | ORAL_TABLET | Freq: Four times a day (QID) | ORAL | 0 refills | Status: DC | PRN

## 2019-08-25 MED ORDER — OXYCODONE HCL 5 MG PO TABS: 5.0000 mg | ORAL_TABLET | ORAL | 0 refills | Status: DC | PRN

## 2019-08-25 MED ORDER — ASPIRIN 81 MG PO CHEW: 81.0000 mg | CHEWABLE_TABLET | Freq: Two times a day (BID) | ORAL | 0 refills | Status: DC

## 2019-08-25 MED ORDER — CHLORHEXIDINE GLUCONATE CLOTH 2 % EX PADS
6.0000 | MEDICATED_PAD | Freq: Every day | CUTANEOUS | Status: DC
  Administered 2019-08-25: 6 via TOPICAL

## 2019-08-25 NOTE — Discharge Instructions (Signed)

## 2019-08-25 NOTE — Progress Notes (Signed)
Physical Therapy Note  SATURATION QUALIFICATIONS: (This note is used to comply with regulatory documentation for home oxygen)  Patient Saturations on Room Air at Rest = 90%  Patient Saturations on Room Air while Ambulating = 85%  Patient Saturations on 2-3 Liters of oxygen while Ambulating = 93-95%  Please briefly explain why patient needs home oxygen: Patient requires supplemental oxygen to maintain oxygen saturations at acceptable, safe levels with physical activity.  Roney Marion, [place]  Acute Rehabilitation Services Pager [phone] Office [phone]

## 2019-08-25 NOTE — Progress Notes (Signed)
Patient's foley removed without incident per MD order. Patient due to void by 12:30pm today. Day shift Nurse updated.

## 2019-08-25 NOTE — Plan of Care (Addendum)
Called short stay, PACU, and admission to try to locate pt's personal walker. He stated he last remembers seeing it at admissions when they moved him to a wheelchair. [place] stated that he does not remembver seeing it in PACU or short stay.   Short stay stated all belongings get moved to PACU and then transferred the call to PACU. PACU stated they would check and then returned the call stating they did not have it. Admissions also stated they did not have it.   Problem: Education: Goal: Knowledge of General Education information will improve Description: Including pain rating scale, medication(s)/side effects and non-pharmacologic comfort measures Outcome: Progressing   Problem: Health Behavior/Discharge Planning: Goal: Ability to manage health-related needs will improve Outcome: Progressing   Problem: Clinical Measurements: Goal: Will remain free from infection Outcome: Progressing Goal: Respiratory complications will improve Outcome: Progressing   Problem: Activity: Goal: Risk for activity intolerance will decrease Outcome: Progressing   Problem: Coping: Goal: Level of anxiety will decrease Outcome: Progressing   Problem: Elimination: Goal: Will not experience complications related to urinary retention Outcome: Progressing   Problem: Pain Managment: Goal: General experience of comfort will improve Outcome: Progressing   Problem: Safety: Goal: Ability to remain free from injury will improve Outcome: Progressing   Problem: Skin Integrity: Goal: Risk for impaired skin integrity will decrease Outcome: Progressing

## 2019-08-25 NOTE — Progress Notes (Signed)
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    PT Comments    Continuing work on functional mobility and activity tolerance;  Motivated to improve, and able to make small, but notable gains in gait distance; Of note, he desatted to 85% on Room Air, and required supplemental O2 2-3 liters to keep O2 sats at safe, acceptable levels with activity; Will continue to follow; on tranc for dc home tomorrow   Follow Up Recommendations  Home health PT(followed by Cardiopulmonary Rehab)     Equipment Recommendations  Rolling walker with 5" wheels;Other (comment)(supplemental O2)    Recommendations for Other Services       Precautions / Restrictions Precautions Precautions: Anterior Hip Restrictions LLE Weight Bearing: Weight bearing as tolerated    Mobility  Bed Mobility                  Transfers Overall transfer level: Needs assistance Equipment used: Rolling walker (2 wheeled) Transfers: Sit to/from Stand Sit to Stand: Min guard(without physical contact)         General transfer comment: min gaurd for safety  Ambulation/Gait Ambulation/Gait assistance: Min guard Gait Distance (Feet): 110 Feet Assistive device: Rolling walker (2 wheeled) Gait Pattern/deviations: Antalgic     General Gait Details: cues to self-monitor for activity tolerance; used RW relatively well; painful L hip in stance   Stairs             Wheelchair Mobility    Modified Rankin (Stroke Patients Only)       Balance Overall balance assessment: No apparent balance deficits (not formally assessed)                                          Cognition Arousal/Alertness: Awake/alert Behavior  During Therapy: WFL for tasks assessed/performed Overall Cognitive Status: Within Functional Limits for tasks assessed                                 General Comments: HOH, tends to yell, can be tangential with conversation      Exercises      General Comments General comments (skin integrity, edema, etc.): See other PT note of this date for O2 qualifying walk      Pertinent Vitals/Pain Pain Assessment: 0-10 Pain Score: 10-Worst pain ever Pain Location: L hip Pain Descriptors / Indicators: Grimacing;Guarding Pain Intervention(s): RN gave pain meds during session;Repositioned    Home Living                      Prior Function            PT Goals (current goals can now be found in the care plan section) Acute Rehab PT Goals Patient Stated Goal: "Go home today!" PT Goal Formulation: With patient Time For Goal Achievement: 09/08/19 Potential to Achieve Goals: Good Progress towards PT goals: Progressing toward goals    Frequency    7X/week      PT Plan Current plan remains appropriate    Co-evaluation  AM-PAC PT "6 Clicks" Mobility   Outcome Measure  Help needed turning from your back to your side while in a flat bed without using bedrails?: None Help needed moving from lying on your back to sitting on the side of a flat bed without using bedrails?: None Help needed moving to and from a bed to a chair (including a wheelchair)?: A Little Help needed standing up from a chair using your arms (e.g., wheelchair or bedside chair)?: None Help needed to walk in hospital room?: A Little Help needed climbing 3-5 steps with a railing? : A Lot 6 Click Score: 20    End of Session Equipment Utilized During Treatment: Gait belt;Oxygen Activity Tolerance: Patient tolerated treatment well Patient left: in chair;with call bell/phone within reach;with chair alarm set Nurse Communication: Mobility status PT Visit Diagnosis: Pain;Other  abnormalities of gait and mobility (R26.89);Other (comment)(Decreased functional capacity ) Pain - Right/Left: Left Pain - part of body: Hip     Time: [id number] PT Time Calculation (min) (ACUTE ONLY): 24 min  Charges:  $Gait Training: 23-37 mins                     Roney Marion, [place]  [place] Pager [phone] Office [place] 08/25/2019, 6:24 PM

## 2019-08-25 NOTE — Evaluation (Signed)
Physical Therapy Evaluation Patient Details Name: Steve Riley MRN: [id number] DOB: 04-21-1951 Today's Date: 08/25/2019   History of Present Illness   69 yo male admitted for L THA, Direct anterior approach, WBAT; Past Medical History: No date: COPD (chronic obstructive pulmonary disease) (HCC) No date: Pneumonia No date: Prostate cancer [facility])   Clinical Impression  Pt is s/p THA resulting in the deficits listed below (see PT Problem List). Received in bed, excited to get moving with PT; comes from home where he was managing independently, walking with a walker due to L avascular necrosis. Lives in 1 level home with same level entry. Available assistance at home; neighbor Santiago Glad will assist with IADLs. Presents to PT with L hip pain limiting activity tolerance, decreased functional capacity due to shortness of breath and O2 sats dropping to 85% during seated rest after progressive ambulation. Pt will benefit from skilled PT to increase their independence and safety with mobility to allow discharge to the venue listed below.   PT will recommend we consider sending pt home with supplemental oxygen and strongly recommend Cardiopulmonary rehab (as outpatient) following home health course.   It is highly likely pt will be able to d/c home today, would like to see him for second PT session.     Follow Up Recommendations Home health PT(followed by cardiopulmonary rehab)    Equipment Recommendations  Rolling walker with 5" wheels    Recommendations for Other Services       Precautions / Restrictions Precautions Precautions: Anterior Hip Restrictions Weight Bearing Restrictions: Yes LLE Weight Bearing: Weight bearing as tolerated      Mobility  Bed Mobility Overal bed mobility: Needs Assistance Bed Mobility: Supine to Sit     Supine to sit: Supervision     General bed mobility comments: supervision for safety, cueing to self-monitor for activity tolerance  Transfers Overall  transfer level: Needs assistance Equipment used: Rolling walker (2 wheeled) Transfers: Sit to/from Stand Sit to Stand: Min guard         General transfer comment: min gaurd for safety  Ambulation/Gait Ambulation/Gait assistance: Min guard Gait Distance (Feet): 100 Feet Assistive device: Rolling walker (2 wheeled) Gait Pattern/deviations: Antalgic     General Gait Details: cues to self-monitor for activity tolerance; used RW relatively well; painful L hip in stance  Stairs            Wheelchair Mobility    Modified Rankin (Stroke Patients Only)       Balance Overall balance assessment: No apparent balance deficits (not formally assessed)                                           Pertinent Vitals/Pain Pain Assessment: 0-10 Pain Score: 6  Pain Location: L hip    Home Living Family/patient expects to be discharged to:: Private residence Living Arrangements: Alone Available Help at Discharge: Neighbor Type of Home: Apartment Home Access: Level entry     Home Layout: One level   Additional Comments: has comode, bar for shower    Prior Function                 Hand Dominance        Extremity/Trunk Assessment   Upper Extremity Assessment Upper Extremity Assessment: Overall WFL for tasks assessed    Lower Extremity Assessment Lower Extremity Assessment: LLE deficits/detail LLE Deficits / Details: deficits of  ROM and strength limited by pain post-op       Communication   Communication: HOH  Cognition Arousal/Alertness: Awake/alert Behavior During Therapy: WFL for tasks assessed/performed Overall Cognitive Status: Within Functional Limits for tasks assessed                                 General Comments: HOH, tends to yell, can be tangential with conversation      General Comments      Exercises General Exercises - Lower Extremity Quad Sets: 10 reps Gluteal Sets: 10 reps Heel Slides: 5 reps(Caused  most pain) Hip ABduction/ADduction: 10 reps   Assessment/Plan    PT Assessment Patient needs continued PT services  PT Problem List Decreased strength;Decreased range of motion;Decreased activity tolerance;Decreased mobility;Pain;Cardiopulmonary status limiting activity       PT Treatment Interventions Gait training;DME instruction;Stair training;Functional mobility training;Therapeutic activities;Therapeutic exercise;Patient/family education    PT Goals (Current goals can be found in the Care Plan section)  Acute Rehab PT Goals Patient Stated Goal: "Go home today!" PT Goal Formulation: With patient Time For Goal Achievement: 09/08/19 Potential to Achieve Goals: Good    Frequency 7X/week   Barriers to discharge        Co-evaluation               AM-PAC PT "6 Clicks" Mobility  Outcome Measure Help needed turning from your back to your side while in a flat bed without using bedrails?: None Help needed moving from lying on your back to sitting on the side of a flat bed without using bedrails?: None Help needed moving to and from a bed to a chair (including a wheelchair)?: A Little Help needed standing up from a chair using your arms (e.g., wheelchair or bedside chair)?: None Help needed to walk in hospital room?: A Little Help needed climbing 3-5 steps with a railing? : A Lot 6 Click Score: 20    End of Session Equipment Utilized During Treatment: Gait belt;Oxygen Activity Tolerance: Patient limited by fatigue Patient left: in chair;with call bell/phone within reach;with chair alarm set Nurse Communication: Other (comment)(Discussed O2 needs) PT Visit Diagnosis: Pain;Other abnormalities of gait and mobility (R26.89);Other (comment)(Decreased functional capacity ) Pain - Right/Left: Left Pain - part of body: Hip    Time: [id number] PT Time Calculation (min) (ACUTE ONLY): 52 min   Charges:              Lattie Corns SPT Acute Rehab Office,  [phone]  Lattie Corns 08/25/2019, 2:50 PM

## 2019-08-25 NOTE — TOC Initial Note (Signed)
Transition of Care [facility]) - Initial/Assessment Note    Patient Details  Name: Steve Riley MRN: [id number] Date of Birth: Feb 14, 1951  [facility]) CM/SW Contact:    Curlene Labrum, RN Phone Number: 08/25/2019, 4:28 PM  Clinical Narrative:     Patient is a pleasant 69 year old male from home with PROCEDURE:  Procedure(s): LEFT TOTAL HIP ARTHROPLASTY ANTERIOR APPROACH (Left).  Patient lives at home and plans on returning to his home with help from son and friend who are listed in the face-sheet information.  Patient has a rolling walker (lost by the hospital) and 3:1 at home.  Patient recently had his oxygen discontinued from home use in January 2021 - but when PT evaluated the patient, his oxygen saturated dropped below acceptable value (Please see PT note) for O 2 evaluation.  Called [place], [place] and notified of this and orders for home O2 to be placed.  Patient currently on 2 liters/minute [place] at 92% after ambulating with the therapists.  Patient given choice for [facility] providers, and patient in agreement that [facility] for [facility] as set up by Dr. Trevor Mace office.  Patient sees Dr Sherral Hammers, as primary care at [facility] in [place].  Discharge medications are preferred through [facility], [place] on [address].  Normally his medicines are provided through the [place] by mail.  No other needs were identified.  Patient plans to have a neighbor transport him home.                  Expected Discharge Plan: [place] Barriers to Discharge: No Barriers Identified   Patient Goals and CMS Choice Patient states their goals for this hospitalization and ongoing recovery are:: I'm ready to get home after my surgery. CMS Medicare.gov Compare Post Acute Care list provided to:: Patient Choice offered to / list presented to : Patient  Expected Discharge Plan and Services Expected Discharge Plan: [place]   Discharge Planning Services:  CM Consult   Living arrangements for the past 2 months: Single Family Home                 DME Arranged: Oxygen, Walker rolling DME Agency: [employer], [place]([facility]) Date DME Agency Contacted: 08/25/19 Time DME Agency Contacted: [phone] Representative spoke with at DME Agency: Left message with April, with [place] Arranged: PT [facility]: [facility] [employer] [facility] [employer] Date [place]: 08/25/19 Time [place]: 1626 Representative spoke with at [place]: [place] aware - patient previously set up prior to surgery  Prior Living Arrangements/Services Living arrangements for the past 2 months: [place] with:: Self Patient language and need for interpreter reviewed:: Yes Do you feel safe going back to the place where you live?: Yes      Need for Family Participation in Patient Care: Yes (Comment) Care giver support system in place?: Yes (comment) Current home services: DME Criminal Activity/Legal Involvement Pertinent to Current Situation/Hospitalization: No - Comment as needed  Activities of Daily Living Home Assistive Devices/Equipment: Gilford Rile (specify type), Hearing aid ADL Screening (condition at time of admission) Patient's cognitive ability adequate to safely complete daily activities?: Yes Is the patient deaf or have difficulty hearing?: Yes Does the patient have difficulty seeing, even when wearing glasses/contacts?: No Does the patient have difficulty concentrating, remembering, or making decisions?: No Patient able to express need for assistance with  ADLs?: Yes Does the patient have difficulty dressing or bathing?: No Independently performs ADLs?: Yes (appropriate for developmental age) Does the patient have difficulty walking or climbing stairs?: Yes Weakness of Legs: Both Weakness of Arms/Hands: None  Permission Sought/Granted Permission sought to share information with : Case  Manager Permission granted to share information with : Yes, Verbal Permission Granted     Permission granted to share info w AGENCY: [facility] [place];  [place] and left message to have Home O2 and Rolling Walker delivered.        Emotional Assessment Appearance:: Appears stated age Attitude/Demeanor/Rapport: Gracious Affect (typically observed): Accepting Orientation: : Oriented to Self, Oriented to Place, Oriented to  Time, Oriented to Situation Alcohol / Substance Use: Tobacco Use(Quit smoking in January 2021) Psych Involvement: No (comment)  Admission diagnosis:  Status post total replacement of left hip [Z96.642] Patient Active Problem List   Diagnosis Date Noted  . Status post total replacement of left hip 08/24/2019  . Avascular necrosis of hip, left ([place]) 04/06/2019  . 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 COPD exacerbation ([place]) 03/06/2018  . 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 Malignant neoplasm of prostate ([place]) 03/12/2017   PCP:  System, Pcp Not In Pharmacy:   [place], [place] - [place] [address] [place] [address] Phone: [phone] Fax: [phone]     Social Determinants of Health (SDOH) Interventions    Readmission Risk Interventions No flowsheet data found.

## 2019-08-25 NOTE — Progress Notes (Signed)
Subjective: 1 Day Post-Op Procedure(s) (LRB): LEFT TOTAL HIP ARTHROPLASTY ANTERIOR APPROACH (Left) Patient reports pain as moderate.  Already states that he hopes to go home this afternoon.  Objective: Vital signs in last 24 hours: Temp:  [97.8 F (36.6 C)-98.5 F (36.9 C)] 98.2 F (36.8 C) (03/17 0500) Pulse Rate:  [64-94] 76 (03/17 0500) Resp:  [12-24] 16 (03/17 0500) BP: (107-155)/(55-98) 138/78 (03/17 0500) SpO2:  [90 %-100 %] 94 % (03/17 0500) Weight:  [86.6 kg] 86.6 kg (03/16 1021)  Intake/Output from previous day: 03/16 0701 - 03/17 0700 In: 2746.8 [P.O.:720; I.V.:1776.8; IV Piggyback:250] Out: 2250 [Urine:2100; Blood:150] Intake/Output this shift: No intake/output data recorded.  Recent Labs    08/25/19 0142  HGB 13.7   Recent Labs    08/25/19 0142  WBC 12.9*  RBC 4.79  HCT 41.9  PLT 293   Recent Labs    08/25/19 0142  NA 134*  K 4.5  CL 100  CO2 24  BUN 10  CREATININE 0.81  GLUCOSE 160*  CALCIUM 9.5   No results for input(s): LABPT, INR in the last 72 hours.  Sensation intact distally Intact pulses distally Dorsiflexion/Plantar flexion intact Incision: dressing C/D/I   Assessment/Plan: 1 Day Post-Op Procedure(s) (LRB): LEFT TOTAL HIP ARTHROPLASTY ANTERIOR APPROACH (Left) Up with therapy Discharge home with home health possibly this afternoon.    Patient's anticipated LOS is less than 2 midnights, meeting these requirements: - Younger than 26 - Lives within 1 hour of care - Has a competent adult at home to recover with post-op recover - NO history of  - Chronic pain requiring opiods  - Diabetes  - Coronary Artery Disease  - Heart failure  - Heart attack  - Stroke  - DVT/VTE  - Cardiac arrhythmia  - Respiratory Failure/COPD  - Renal failure  - Anemia  - Advanced Liver disease       Mcarthur Rossetti 08/25/2019, 7:34 AM

## 2019-08-26 NOTE — TOC Transition Note (Signed)
Transition of Care [facility]) - CM/SW Discharge Note   Patient Details  Name: Steve Riley MRN: [id number] Date of Birth: 08/19/1950  Transition of Care [facility]) CM/SW Contact:  Curlene Labrum, RN Phone Number: 08/26/2019, 11:37 AM   Clinical Narrative:     Contacted and spoke with the [place] this morning - Marguita laughlin at [phone], [phone] in regards to patient transition to home.  Patient found his RW that was lost while in the hospital - not needed.  Home O2 ordered and will be delivered to the home after discharge.  Dr. Ninfa Linden states that patient's oxygen needs are at his baseline so O2 will not need to be delivered to the hospital before discharge.  Fax all needed documents to the O2 clinic at the [place] at fax [phone]  - including O2 MD order, discharge summary, PT notes for qualifying O2 documentation.    Patient set up with [facility] for [place] aware and all documents sent to the patient's [facility] - Dr. Sherral Hammers at [phone] - including discharge summary, orders for home health, PT notes, and physician H&P.  Friend to transport the patient home today.  No needed DME prior to discharge today.    Final next level of care: [place] Barriers to Discharge: No Barriers Identified   Patient Goals and CMS Choice Patient states their goals for this hospitalization and ongoing recovery are:: I'm ready to get home after my surgery. CMS Medicare.gov Compare Post Acute Care list provided to:: Patient Choice offered to / list presented to : Patient  Discharge Placement                       Discharge Plan and Services   Discharge Planning Services: CM Consult            DME Arranged: Oxygen, Walker rolling DME Agency: [facility], [place](Called and notified [place] of DME needs) Date DME Agency Contacted: 08/25/19 Time DME Agency Contacted: [phone] Representative spoke with at DME Agency: Left message with April, with [place]  [facility] Arranged: PT [place]: [facility] (formerly [employer]) Date [place]: 08/25/19 Time [place]: 1626 Representative spoke with at [place]: [place] aware - patient previously set up prior to surgery  Social Determinants of Health (SDOH) Interventions     Readmission Risk Interventions Readmission Risk Prevention Plan 08/25/2019  Post Dischage Appt Complete  Medication Screening Complete  Transportation Screening Complete  Some recent data might be hidden

## 2019-08-26 NOTE — Plan of Care (Signed)

## 2019-08-26 NOTE — Discharge Summary (Signed)
Patient ID: Steve Riley MRN: [id number] DOB/AGE: [birth year] 69 y.o.  Admit date: 08/24/2019 Discharge date: 08/26/2019  Admission Diagnoses:  Principal Problem:   Avascular necrosis of hip, left ([place]) Active Problems:   Status post total replacement of left hip   Discharge Diagnoses:  Same  Past Medical History:  Diagnosis Date  . COPD (chronic obstructive pulmonary disease) ([place])   . Pneumonia   . Prostate cancer [facility])     Surgeries: Procedure(s): LEFT TOTAL HIP ARTHROPLASTY ANTERIOR APPROACH on 08/24/2019   Consultants:   Discharged Condition: Improved  Hospital Course: Steve Riley is an 69 y.o. male who was admitted 08/24/2019 for operative treatment ofAvascular necrosis of hip, left ([place]). Patient has severe unremitting pain that affects sleep, daily activities, and work/hobbies. After pre-op clearance the patient was taken to the operating room on 08/24/2019 and underwent  Procedure(s): LEFT TOTAL HIP ARTHROPLASTY ANTERIOR APPROACH.    Patient was given perioperative antibiotics:  Anti-infectives (From admission, onward)   Start     Dose/Rate Route Frequency Ordered Stop   08/24/19 1745  clindamycin (CLEOCIN) IVPB 600 mg     600 mg 100 mL/hr over 30 Minutes Intravenous Every 6 hours 08/24/19 1738 08/24/19 2353   08/24/19 1015  clindamycin (CLEOCIN) IVPB 900 mg     900 mg 100 mL/hr over 30 Minutes Intravenous On call to O.R. 08/24/19 1007 08/24/19 1205       Patient was given sequential compression devices, early ambulation, and chemoprophylaxis to prevent DVT.  Patient benefited maximally from hospital stay and there were no complications.    Recent vital signs:  Patient Vitals for the past 24 hrs:  BP Temp Temp src Pulse Resp SpO2  08/26/19 0739 125/69 98.6 F (37 C) Oral 88 17 92 %  08/26/19 0346 140/76 99.6 F (37.6 C) Oral 92 18 92 %  08/25/19 2019 -- -- -- -- -- 90 %  08/25/19 2009 (!) 151/80 98.3 F (36.8 C) Oral 87 17 91 %  08/25/19 1609 -- --  -- -- -- 90 %  08/25/19 1308 138/75 99 F (37.2 C) Oral 83 17 96 %     Recent laboratory studies:  Recent Labs    08/25/19 0142  WBC 12.9*  HGB 13.7  HCT 41.9  PLT 293  NA 134*  K 4.5  CL 100  CO2 24  BUN 10  CREATININE 0.81  GLUCOSE 160*  CALCIUM 9.5     Discharge Medications:   Allergies as of 08/26/2019      Reactions   Penicillin G Other (See Comments)   "passed out"/tongue swelling/childhood allergy Did it involve swelling of the face/tongue/throat, SOB, or low BP?    #  #  YES  #  #  Did it involve sudden or severe rash/hives, skin peeling, or any reaction on the inside of your mouth or nose? No Did you need to seek medical attention at a hospital or doctor's office? No When did it last happen?childhood allergy If all above answers are "NO", may proceed with cephalosporin use.      Medication List    TAKE these medications   albuterol 108 (90 Base) MCG/ACT inhaler Commonly known as: VENTOLIN HFA Inhale 1-2 puffs into the lungs every 6 (six) hours as needed for wheezing.   APPLE CIDER VINEGAR PO Take 450 mg by mouth in the morning and at bedtime.   aspirin 81 MG chewable tablet Chew 1 tablet (81 mg total) by mouth 2 (two) times daily.  B-12 5000 MCG Caps Take 5,000 mcg by mouth daily.   budesonide-formoterol 160-4.5 MCG/ACT inhaler Commonly known as: SYMBICORT Inhale 2 puffs into the lungs 2 (two) times daily.   Calcium 600/Vitamin D 600-400 MG-UNIT chew tablet Generic drug: Calcium Carbonate-Vitamin D Chew 1 tablet by mouth 2 (two) times daily.   celecoxib 100 MG capsule Commonly known as: CELEBREX Take 100 mg by mouth daily.   Dialyvite Vitamin D 5000 125 MCG (5000 UT) capsule Generic drug: Cholecalciferol Take 5,000 Units by mouth daily.   FLAX PO Take 2,600 mg by mouth daily.   methocarbamol 500 MG tablet Commonly known as: ROBAXIN Take 1 tablet (500 mg total) by mouth every 6 (six) hours as needed for muscle spasms.   MSM 1000  MG Caps Take 1,000 mg by mouth 2 (two) times daily.   OVER THE COUNTER MEDICATION Take 4,000 Units by mouth in the morning and at bedtime. Serrapeptase   oxyCODONE 5 MG immediate release tablet Commonly known as: Oxy IR/ROXICODONE Take 1-2 tablets (5-10 mg total) by mouth every 4 (four) hours as needed for moderate pain (pain score 4-6).   Probiotic Caps Take 1 capsule by mouth 2 (two) times daily with a meal.   Selenimin-200 200 MCG Tabs tablet Generic drug: selenium Take 200 mcg by mouth daily.   silver sulfADIAZINE 1 % cream Commonly known as: SILVADENE Apply 1 application topically in the morning, at noon, and at bedtime.   Spiriva Respimat 2.5 MCG/ACT Aers Generic drug: Tiotropium Bromide Monohydrate Inhale 1 puff into the lungs in the morning and at bedtime.   traZODone 100 MG tablet Commonly known as: DESYREL Take 100 mg by mouth at bedtime.   Turmeric Curcumin 500 MG Caps Take 500 mg by mouth in the morning and at bedtime.   vitamin C with rose hips 1000 MG tablet Take 1,000 mg by mouth daily.   Zinc 50 MG Tabs Take 50 mg by mouth daily.            Durable Medical Equipment  (From admission, onward)         Start     Ordered   08/26/19 0000  For home use only DME oxygen    Question Answer Comment  Length of Need 6 Months   Mode or (Route) Nasal cannula   Frequency Continuous (stationary and portable oxygen unit needed)   Oxygen conserving device No   Oxygen delivery system Gas      08/26/19 0851   08/25/19 1618  For home use only DME oxygen  Once    Question Answer Comment  Length of Need 6 Months   Frequency Continuous (stationary and portable oxygen unit needed)   Oxygen conserving device No   Oxygen delivery system Gas      08/25/19 1619   08/24/19 1739  DME 3 n 1  Once     08/24/19 1738   08/24/19 1739  DME Walker rolling  Once    Question Answer Comment  Walker: With [place]   Patient needs a walker to treat with the following  condition Status post total replacement of left hip      08/24/19 1738          Diagnostic Studies: DG Pelvis Portable  Result Date: 08/24/2019 CLINICAL DATA:  Postop EXAM: PORTABLE PELVIS 1-2 VIEWS COMPARISON:  08/24/2019 FINDINGS: Previous right hip replacement with normal alignment. Interval left hip replacement with intact hardware and normal alignment. No fracture. Gas in the soft  tissues consistent with recent surgery IMPRESSION: Interval left hip replacement with expected postsurgical change Electronically Signed   By: Donavan Foil M.D.   On: 08/24/2019 17:16   DG C-Arm 1-60 Min  Result Date: 08/24/2019 CLINICAL DATA:  Status post total hip arthroplasty on the left EXAM: DG C-ARM 1-60 MIN FLUOROSCOPY TIME:  Fluoroscopy Time:  24 seconds Number of Acquired Spot Images: 3 COMPARISON:  05/19/2019 FINDINGS: The patient has undergone total hip arthroplasty on the left. Alignment appears near anatomic. There are expected postsurgical changes including overlying subcutaneous gas and soft tissue edema. IMPRESSION: Status post left total hip arthroplasty. Electronically Signed   By: Constance Holster M.D.   On: 08/24/2019 14:55   DG HIP OPERATIVE UNILAT W OR W/O PELVIS LEFT  Result Date: 08/24/2019 CLINICAL DATA:  Total hip arthroplasty EXAM: OPERATIVE LEFT HIP (WITH PELVIS IF PERFORMED) 3 VIEWS TECHNIQUE: Fluoroscopic spot image(s) were submitted for interpretation post-operatively. COMPARISON:  03/19/2019 FINDINGS: The patient has undergone total hip arthroplasty on the left. Alignment appears near anatomic. There are expected postsurgical changes including overlying subcutaneous gas and soft tissue edema. IMPRESSION: Status post left total hip arthroplasty. Electronically Signed   By: Constance Holster M.D.   On: 08/24/2019 14:54    Disposition: Discharge disposition: 01-Home or Self Care       Discharge Instructions    For home use only DME oxygen   Complete by: As directed     Length of Need: 6 Months   Mode or (Route): Nasal cannula   Frequency: Continuous (stationary and portable oxygen unit needed)   Oxygen conserving device: No   Oxygen delivery system: Gas      Follow-up Information    Mcarthur Rossetti, MD Follow up in 2 week(s).   Specialty: Orthopedic Surgery Contact information: [place] [address] [phone]        [facility]   Specialty: Home Health Services Why: [facility] will be providing physical therapy. You should receive a call in the next 24-48 hours for therapy. Contact information: [address] [place] [address] [phone]        [facility] Follow up.   Why: [facility] to supply you with a rolling walker and home oxygen as ordered by your physician. Contact information: [place] [address] [phone]            Signed: Mcarthur Rossetti 08/26/2019, 8:51 AM

## 2019-08-26 NOTE — Progress Notes (Addendum)
Physical Therapy Treatment Patient Details Name: Steve Riley MRN: [id number] DOB: Apr 07, 1951 Today's Date: 08/26/2019    History of Present Illness Admitted for THA secondary to avascular necrosis;  has a past medical history of COPD (chronic obstructive pulmonary disease) ([place]), Pneumonia, and Prostate cancer ([place]).    PT Comments    Pt demonstrates high motivation for functional mobility to this day. He demonstrated improved ambulation 154ft with no near LOB with RW & 3L O2 [place]. He demonstrates modI functional mobility, requiring no verbal cues for breathing or sequencing, but intermittent VCs for gait/posture. Pt demonstrated good performance of PLB throughout ambulation demonstrating moderate to respiratory distress following activity though SPO2 remained above 90% throughout activity. Pt educated on initial HEP verbalizing and demonstrating understanding. Pt is safe to return to home at this point from a mobility stand point. PT will continue following while admitted to address continued functional deficits.  Follow Up Recommendations  Home health PT(folled by cardiopul rehab)     Equipment Recommendations  Rolling walker with 5" wheels;Other (comment)    Recommendations for Other Services       Precautions / Restrictions Precautions Precautions: Anterior Hip Restrictions Weight Bearing Restrictions: Yes LLE Weight Bearing: Weight bearing as tolerated    Mobility  Bed Mobility Overal bed mobility: Needs Assistance Bed Mobility: Supine to Sit     Supine to sit: Supervision     General bed mobility comments: supervision for safety, cueing to self-monitor for activity tolerance  Transfers Overall transfer level: Needs assistance Equipment used: Rolling walker (2 wheeled) Transfers: Sit to/from Stand Sit to Stand: Supervision;Modified independent (Device/Increase time)         General transfer comment: min gaurd for safety  Ambulation/Gait Ambulation/Gait  assistance: Supervision;Modified independent (Device/Increase time) Gait Distance (Feet): 175 Feet Assistive device: Rolling walker (2 wheeled) Gait Pattern/deviations: Antalgic     General Gait Details: cues to self-monitor for activity tolerance; used RW relatively well; painful L hip in stance      Balance Overall balance assessment: No apparent balance deficits (not formally assessed)      Cognition Arousal/Alertness: Awake/alert Behavior During Therapy: WFL for tasks assessed/performed Overall Cognitive Status: Within Functional Limits for tasks assessed     General Comments: HOH, tends to yell, can be tangential with conversation      Exercises General Exercises - Lower Extremity Ankle Circles/Pumps: AROM;Both;10 reps Quad Sets: AROM;Both;10 reps Gluteal Sets: AROM;Both;10 reps Short Arc Quad: AROM;Both;10 reps Long Arc Quad: AROM;Both;10 reps Heel Slides: AROM;Left;10 reps;Supine Hip ABduction/ADduction: AROM;5 reps;Left Straight Leg Raises: AROM;Left;5 reps    General Comments General comments (skin integrity, edema, etc.): Pt continues to requrie O2 for ambulation 3L/min to maintain O2 sats >90%      Pertinent Vitals/Pain Pain Assessment: Faces Faces Pain Scale: Hurts little more Pain Location: L hip Pain Descriptors / Indicators: Grimacing;Guarding           PT Goals (current goals can now be found in the care plan section) Acute Rehab PT Goals Patient Stated Goal: Go home today to get a shower PT Goal Formulation: With patient Time For Goal Achievement: 09/08/19 Potential to Achieve Goals: Good Progress towards PT goals: Progressing toward goals    Frequency    7X/week      PT Plan Current plan remains appropriate       AM-PAC PT "6 Clicks" Mobility   Outcome Measure  Help needed turning from your back to your side while in a flat bed without using bedrails?: None  Help needed moving from lying on your back to sitting on the side of a  flat bed without using bedrails?: None Help needed moving to and from a bed to a chair (including a wheelchair)?: A Little Help needed standing up from a chair using your arms (e.g., wheelchair or bedside chair)?: None Help needed to walk in hospital room?: A Little Help needed climbing 3-5 steps with a railing? : A Lot 6 Click Score: 20    End of Session Equipment Utilized During Treatment: Gait belt;Oxygen Activity Tolerance: Patient tolerated treatment well Patient left: in chair;with call bell/phone within reach;with chair alarm set Nurse Communication: Mobility status PT Visit Diagnosis: Pain;Other abnormalities of gait and mobility (R26.89);Other (comment) Pain - Right/Left: Left Pain - part of body: Hip     Time: [id number] PT Time Calculation (min) (ACUTE ONLY): 35 min  Charges:  $Gait Training: 8-22 mins $Therapeutic Exercise: 8-22 mins                     Ann Held PT, DPT Acute Rehab [facility] Rehabilitation P: [phone]    Lacharles Altschuler A Machael Raine 08/26/2019, 10:23 AM

## 2019-08-26 NOTE — Progress Notes (Signed)
Patient ID: Steve Riley, male   DOB: April 20, 1951, 69 y.o.   MRN: [id number] No acute changes.  Left hip stable.  Can be discharged to home this am.  Has been on and off of home oxygen.  He states that he was taken off of O2 in January by the [place] because his baseline sats were 93%.  He has been lower here and occasionally seems labored in his breathing.  However, this does sem to be his baseline.  He was exactly the same way several months ago when we did his other hip.

## 2019-08-31 ENCOUNTER — Telehealth: Payer: Self-pay | Admitting: Orthopaedic Surgery

## 2019-08-31 NOTE — Telephone Encounter (Signed)
Please advise 

## 2019-08-31 NOTE — Telephone Encounter (Signed)
Patient called requesting a refill oxycodone medication. Please send to pharmacy on file. Patient phone number is [phone].

## 2019-09-01 MED ORDER — OXYCODONE HCL 5 MG PO TABS: 5.0000 mg | ORAL_TABLET | ORAL | 0 refills | Status: DC | PRN

## 2019-09-07 ENCOUNTER — Inpatient Hospital Stay: Payer: No Typology Code available for payment source | Admitting: Physician Assistant

## 2019-09-08 ENCOUNTER — Ambulatory Visit (INDEPENDENT_AMBULATORY_CARE_PROVIDER_SITE_OTHER): Payer: No Typology Code available for payment source | Admitting: Orthopedic Surgery

## 2019-09-08 ENCOUNTER — Encounter: Payer: Self-pay | Admitting: Orthopedic Surgery

## 2019-09-08 ENCOUNTER — Other Ambulatory Visit: Payer: Self-pay

## 2019-09-08 ENCOUNTER — Ambulatory Visit: Payer: Self-pay

## 2019-09-08 VITALS — Ht 68.0 in | Wt 191.0 lb

## 2019-09-08 DIAGNOSIS — Z96641 Presence of right artificial hip joint: Secondary | ICD-10-CM

## 2019-09-08 DIAGNOSIS — J449 Chronic obstructive pulmonary disease, unspecified: Secondary | ICD-10-CM | POA: Diagnosis not present

## 2019-09-08 NOTE — Progress Notes (Signed)
Post-Op Visit Note   Patient: Steve Riley           Date of Birth: 1951-02-10           MRN: [id number] Visit Date: 09/08/2019 PCP: System, Pcp Not In   Assessment & Plan:  Chief Complaint:  Chief Complaint  Patient presents with  . Left Hip - Routine Post Op    08/24/2019 Left THA   Visit Diagnoses:  1. Status post total replacement of right hip     Plan: Steve Riley is a 69 year old patient who is now 2 weeks out left total hip replacement. Patient states in general this hip has been significantly more painful than the right hip. On exam he has intact incision. Leg lengths equal. Hip flexion strength predictably mildly diminished. No groin pain with internal/external rotation of the leg. Homans negative. Radiographs look good. Plan at this time is to transition from home health physical therapy to home exercise program. Patient does not really want to do outpatient PT unless the [place] pays for it. Not sure if there can to do that. I do not think it is really necessary based on his experience with the prior hip. We will have him come back in 3 weeks and see Dr. Ninfa Linden and for now we will continue with home health PT transitioning to home exercise program.  Follow-Up Instructions: Return in about 3 weeks (around 09/29/2019).   Orders:  Orders Placed This Encounter  Procedures  . XR HIP UNILAT W OR W/O PELVIS 2-3 VIEWS LEFT   No orders of the defined types were placed in this encounter.   Imaging: XR HIP UNILAT W OR W/O PELVIS 2-3 VIEWS LEFT  Result Date: 09/08/2019 AP pelvis lateral left hip reviewed. Total hip prosthesis in good position alignment with no complicating features. Leg lengths equal. Symmetry is present between the implants on both sides.   PMFS History: Patient Active Problem List   Diagnosis Date Noted  . Status post total replacement of left hip 08/24/2019  . Avascular necrosis of hip, left ([place]) 04/06/2019  . Status post total replacement of right hip  04/06/2019  . COPD exacerbation ([place]) 03/06/2018  . 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 Malignant neoplasm of prostate ([place]) 03/12/2017   Past Medical History:  Diagnosis Date  . COPD (chronic obstructive pulmonary disease) ([place])   . Pneumonia   . Prostate cancer [facility])     Family History  Problem Relation Age of Onset  . Cancer Neg Hx     Past Surgical History:  Procedure Laterality Date  . BACK SURGERY    . HAND SURGERY    . PROSTATE BIOPSY    . TOTAL HIP ARTHROPLASTY Right 04/06/2019   Procedure: RIGHT TOTAL HIP ARTHROPLASTY ANTERIOR APPROACH;  Surgeon: Mcarthur Rossetti, MD;  Location: [place];  Service: Orthopedics;  Laterality: Right;  . TOTAL HIP ARTHROPLASTY Left 08/24/2019  . TOTAL HIP ARTHROPLASTY Left 08/24/2019   Procedure: LEFT TOTAL HIP ARTHROPLASTY ANTERIOR APPROACH;  Surgeon: Mcarthur Rossetti, MD;  Location: [place];  Service: Orthopedics;  Laterality: Left;   Social History   Occupational History  . Not on file  Tobacco Use  . Smoking status: Current Some Day Smoker    Packs/day: 3.00    Years: 54.00    Pack years: 162.00    Types: Cigarettes    Last attempt to quit: 07/11/2016    Years since quitting: 3.1  . Smokeless tobacco: Never Used  . Tobacco comment: occassionally  1-3 per week  Substance and Sexual Activity  . Alcohol use: No  . Drug use: No  . Sexual activity: Not Currently

## 2019-09-29 ENCOUNTER — Encounter: Payer: Self-pay | Admitting: Orthopaedic Surgery

## 2019-09-29 ENCOUNTER — Ambulatory Visit (INDEPENDENT_AMBULATORY_CARE_PROVIDER_SITE_OTHER): Payer: No Typology Code available for payment source | Admitting: Orthopaedic Surgery

## 2019-09-29 ENCOUNTER — Other Ambulatory Visit: Payer: Self-pay

## 2019-09-29 DIAGNOSIS — Z96642 Presence of left artificial hip joint: Secondary | ICD-10-CM

## 2019-09-29 NOTE — Progress Notes (Signed)
The patient comes in today about 5 weeks out from a left total hip arthroplasty.  We have also replaced his right hip.  He is 69 years old; he does have COPD and does report shortness of breath.  He is working on trying to stop smoking.  He states that his first few steps when he gets up and moving are harder on the left side.  He had an easier recovery from his right total hip arthroplasty.  He is walking without an assistive device.  On exam today both hips are moving smoothly and fluidly.  He is feeling better today than he did a few weeks ago.  Overall his leg lengths look good and he seems to be improving.  At this point I gave him reassurance that his hips are doing well.  He agrees.  At this point also we do not need to see him back for 6 months unless there are issues.  At that visit I like a standing low AP pelvis.

## 2019-12-24 IMAGING — RF DG HIP (WITH PELVIS) OPERATIVE*R*
1 series · 7 of 7 positions shown · IV contrast (agent unspecified)
Comparison: 02/22/2019.

CLINICAL DATA: Right hip arthroplasty.

EXAM:
DG C-ARM 1-60 MIN; OPERATIVE RIGHT HIP WITH PELVIS; PORTABLE PELVIS
1-2 VIEWS
CONTRAST:  None.
FLUOROSCOPY TIME:  Fluoroscopy Time:  0 minutes 31 seconds.
Number of Acquired Spot Images: 7

[Series 1: unknown protocol · 0.20mm/px · 7 of 7 slices shown]
[im 1/7]
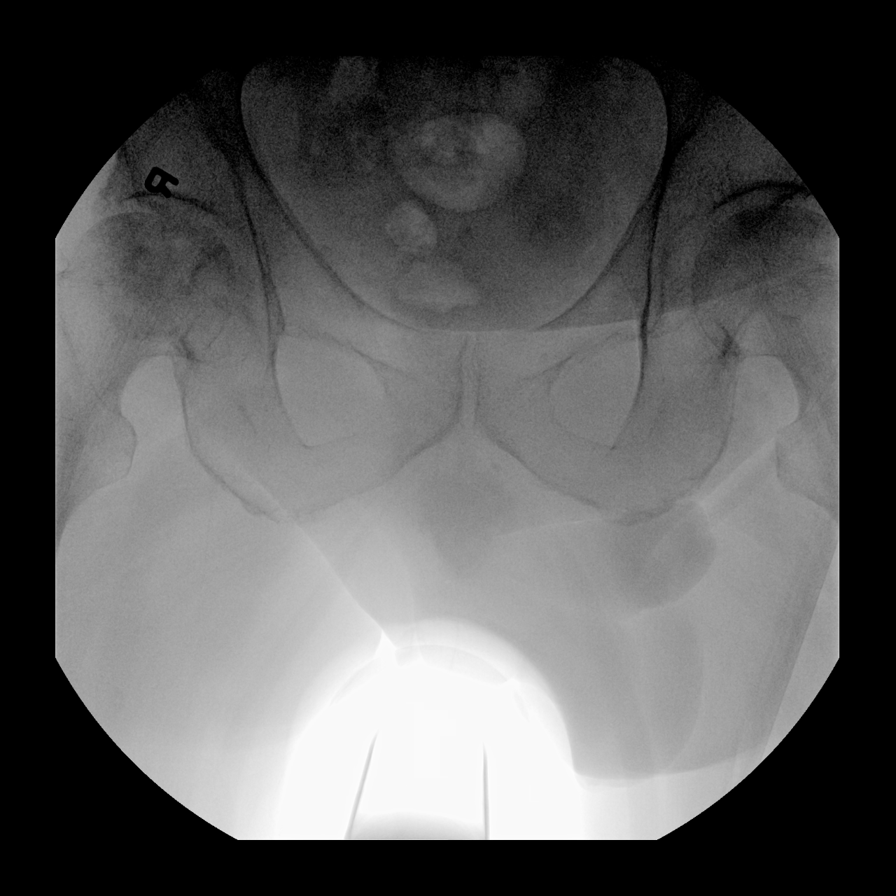
[im 2/7]
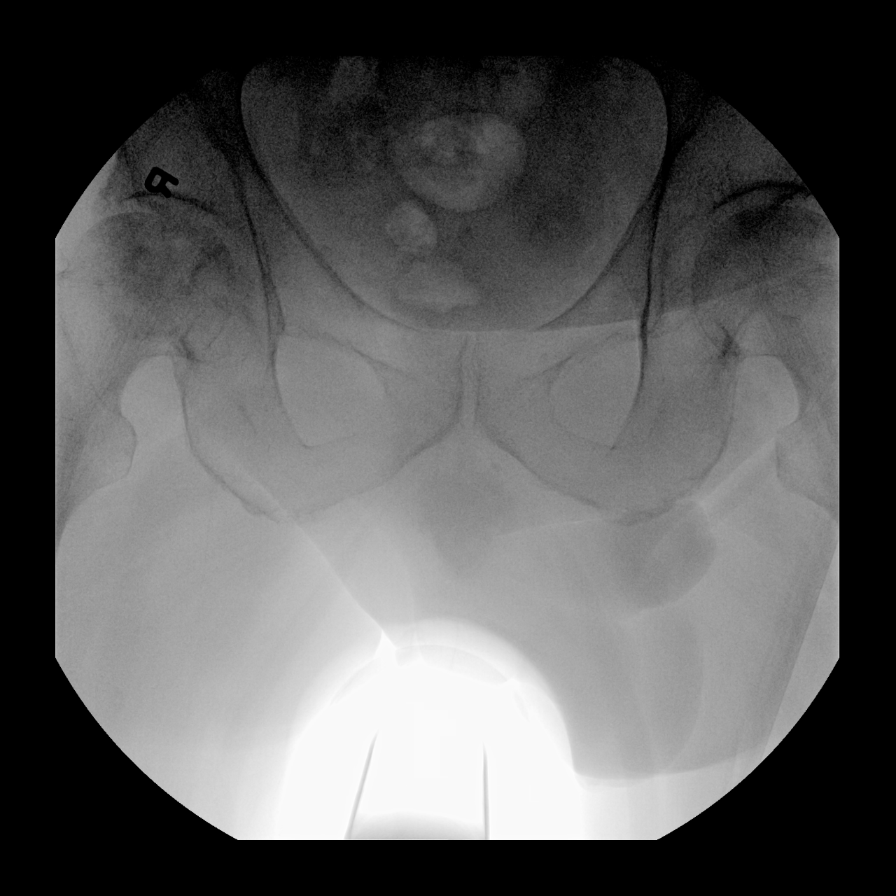
[im 3/7]
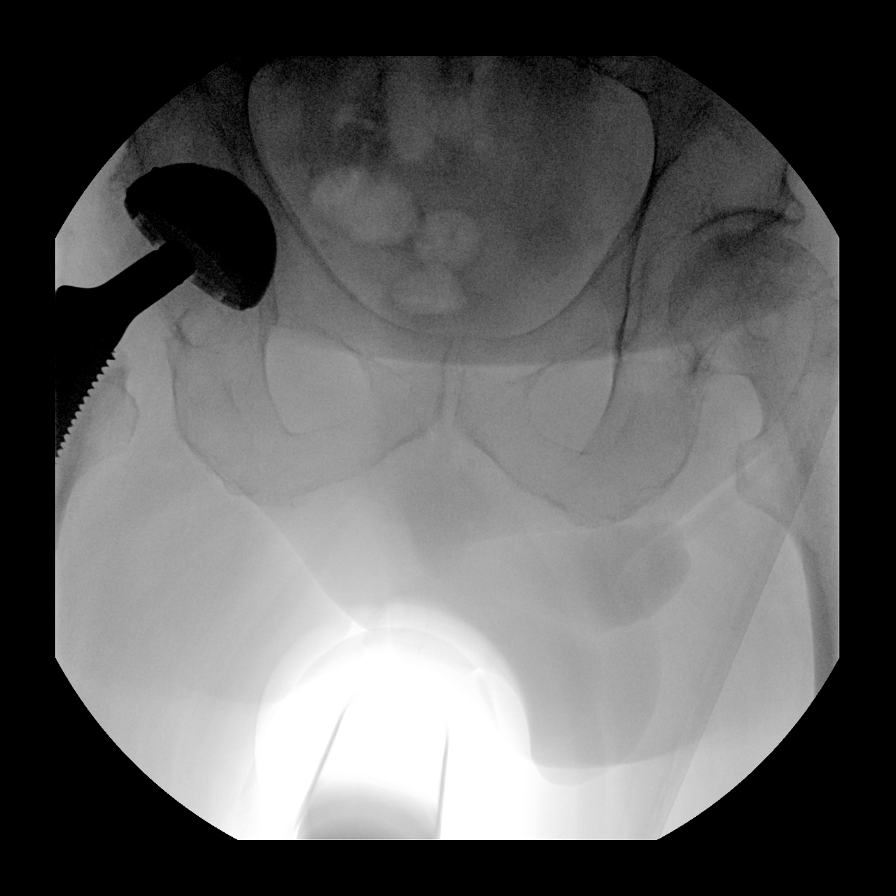
[im 4/7]
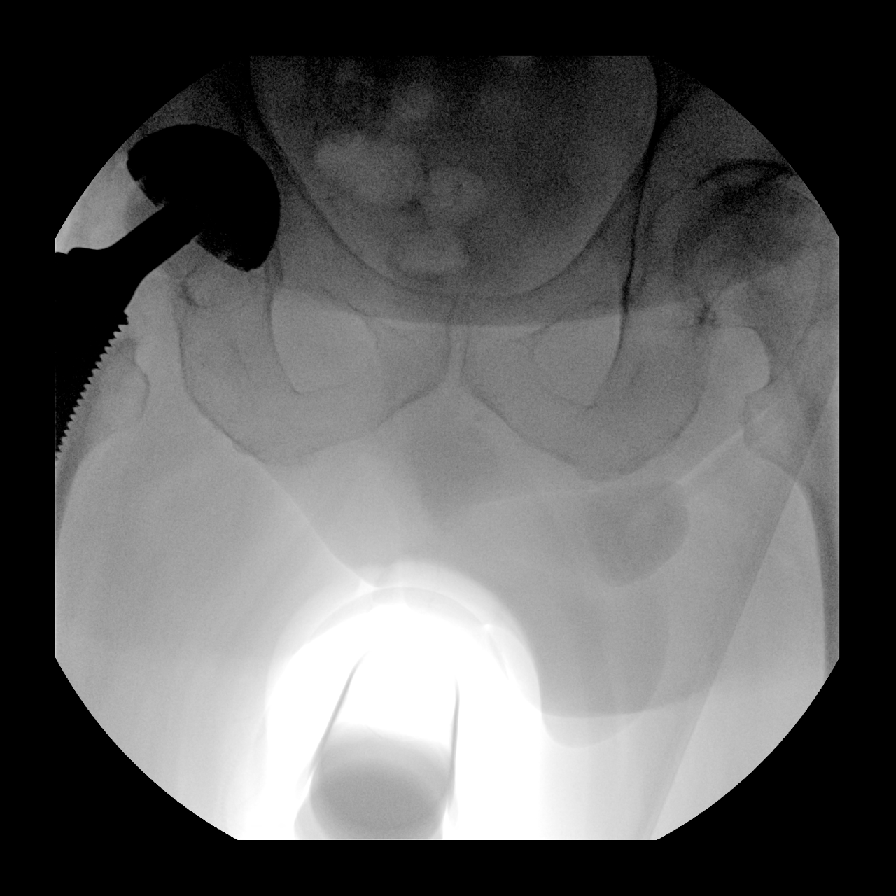
[im 5/7]
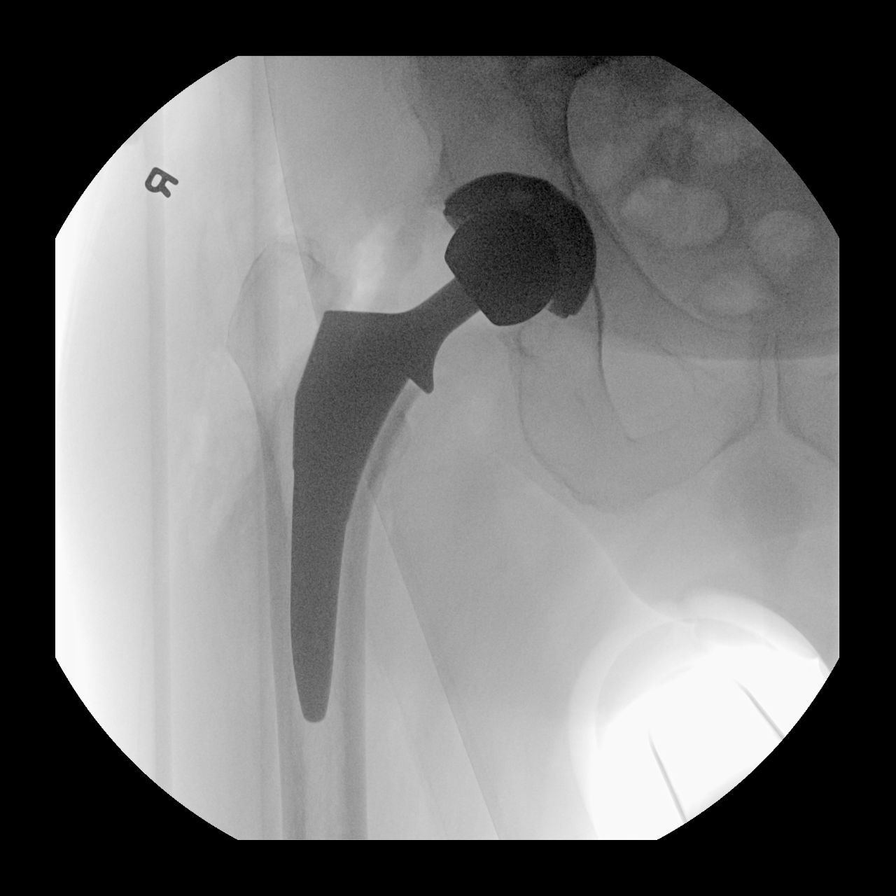
[im 6/7]
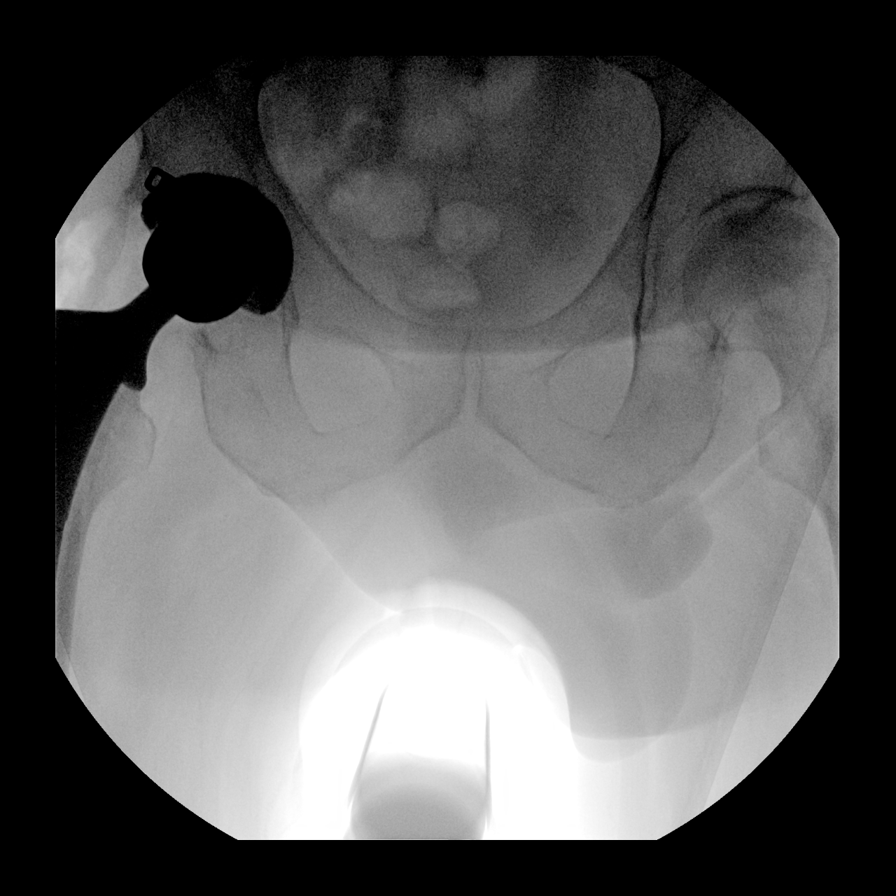
[im 7/7]
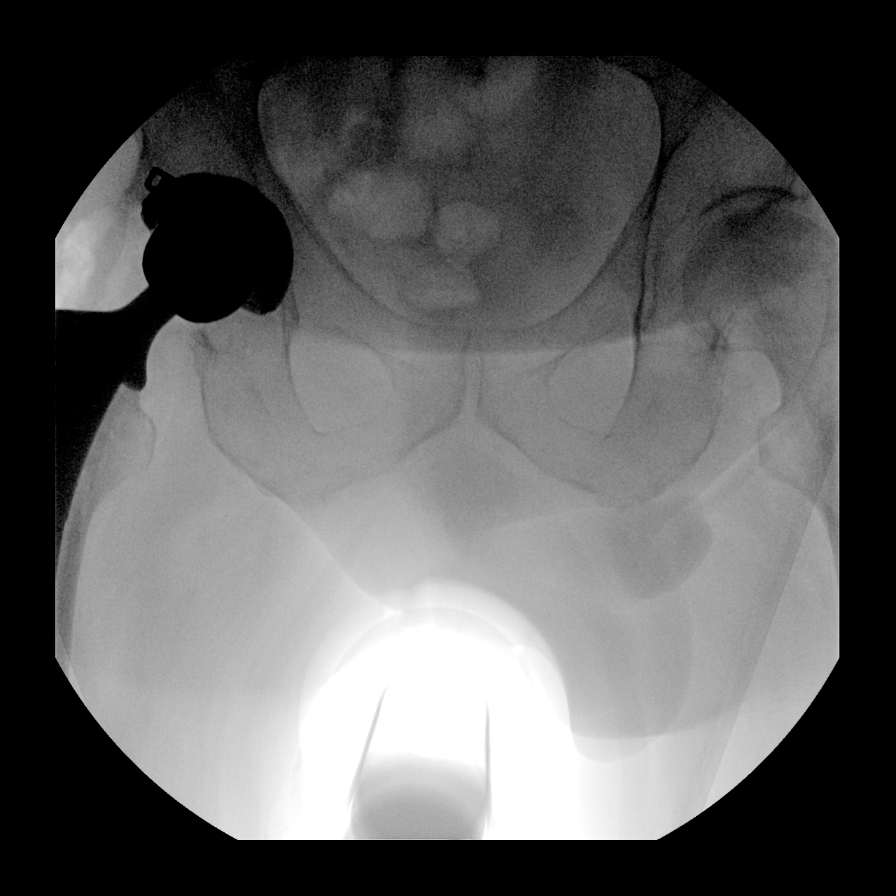

[7 of 7 positions shown; findings below may reference images not displayed]

FINDINGS: Total right hip replacement.  Hardware intact.  Anatomic alignment.
IMPRESSION: Total right hip replacement anatomic alignment.

## 2019-12-24 IMAGING — DX DG PORTABLE PELVIS
1 series · 1 of 1 positions shown · IV contrast (agent unspecified)
Comparison: 02/22/2019.

CLINICAL DATA: Right hip arthroplasty.

EXAM:
DG C-ARM 1-60 MIN; OPERATIVE RIGHT HIP WITH PELVIS; PORTABLE PELVIS
1-2 VIEWS
CONTRAST:  None.
FLUOROSCOPY TIME:  Fluoroscopy Time:  0 minutes 31 seconds.
Number of Acquired Spot Images: 7

[pelvis ap]
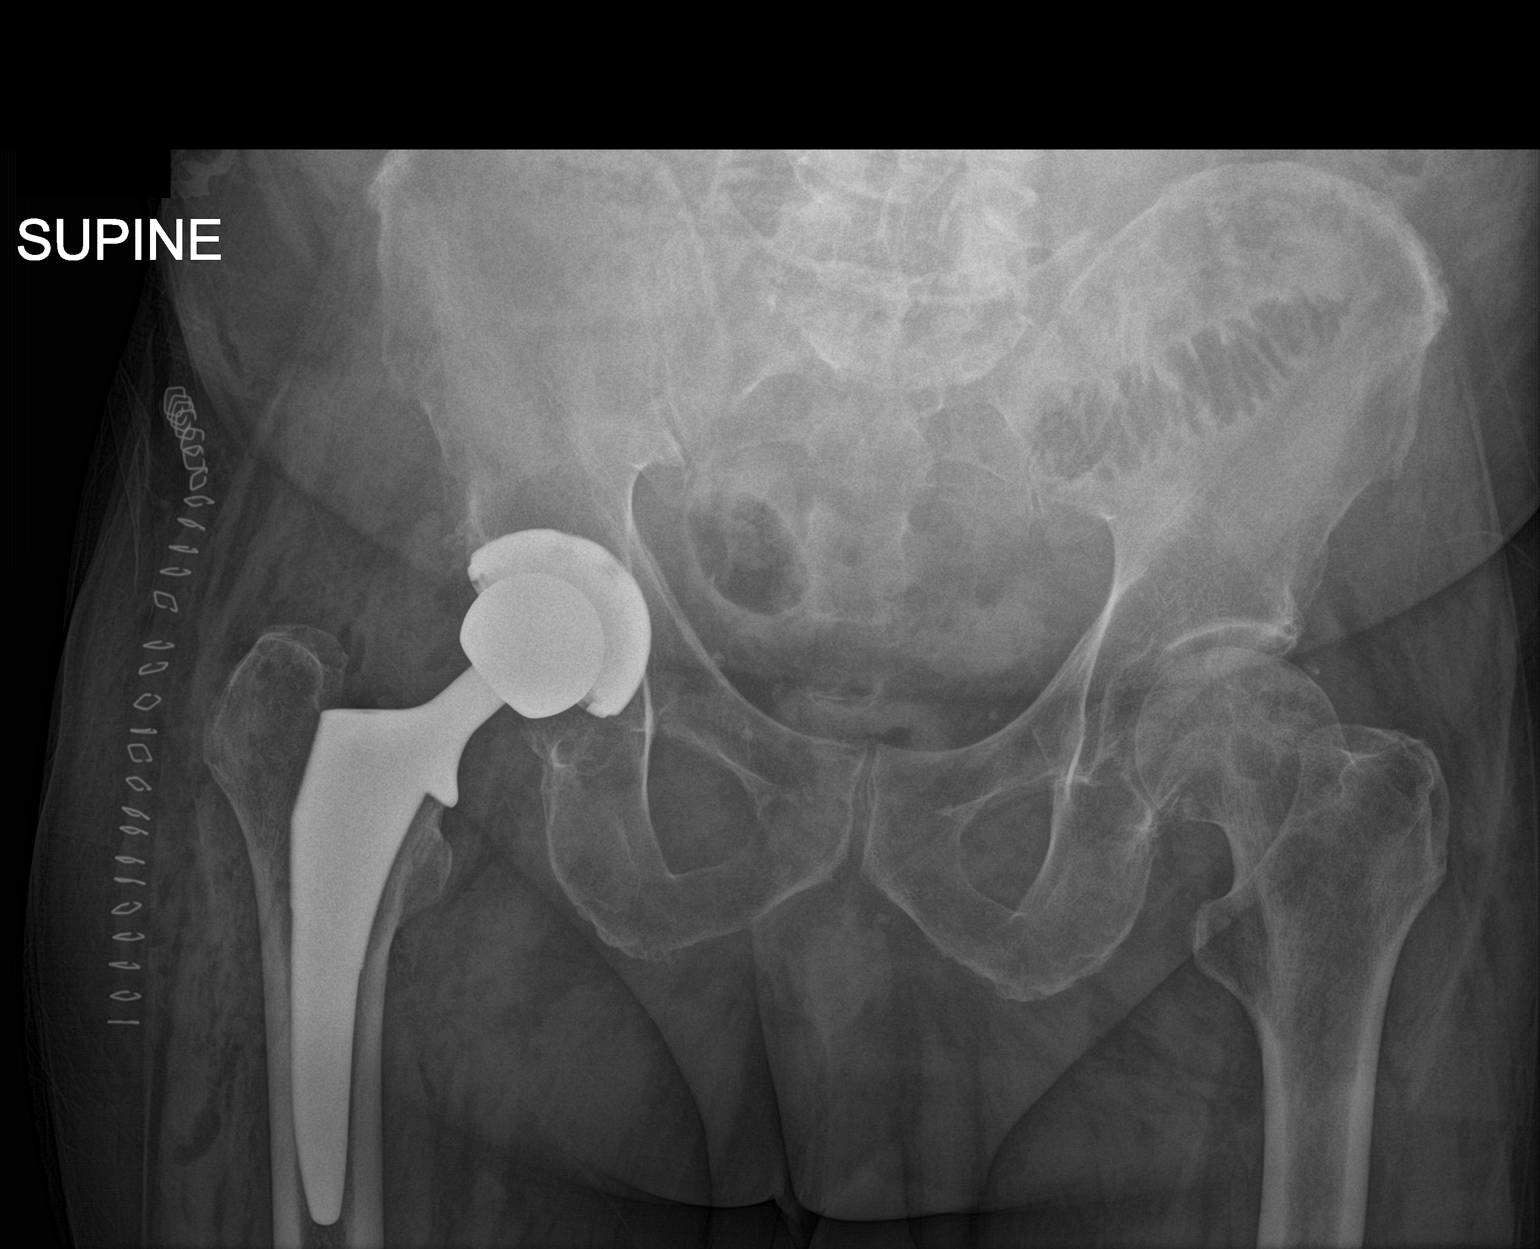

[1 of 1 positions shown; findings below may reference images not displayed]

FINDINGS: Total right hip replacement.  Hardware intact.  Anatomic alignment.
IMPRESSION: Total right hip replacement anatomic alignment.

## 2019-12-24 IMAGING — RF DG C-ARM 1-60 MIN
1 series · 7 of 7 positions shown · IV contrast (agent unspecified)
Comparison: 02/22/2019.

CLINICAL DATA: Right hip arthroplasty.

EXAM:
DG C-ARM 1-60 MIN; OPERATIVE RIGHT HIP WITH PELVIS; PORTABLE PELVIS
1-2 VIEWS
CONTRAST:  None.
FLUOROSCOPY TIME:  Fluoroscopy Time:  0 minutes 31 seconds.
Number of Acquired Spot Images: 7

[Series 1: unknown protocol · 0.20mm/px · 7 of 7 slices shown]
[im 1/7]
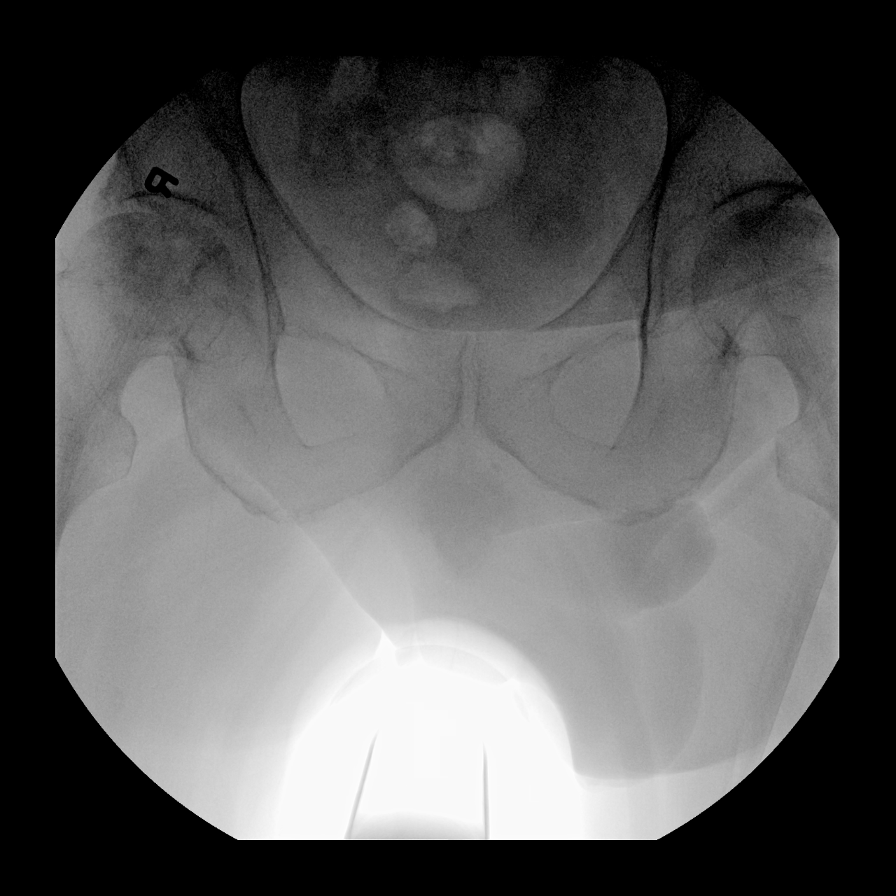
[im 2/7]
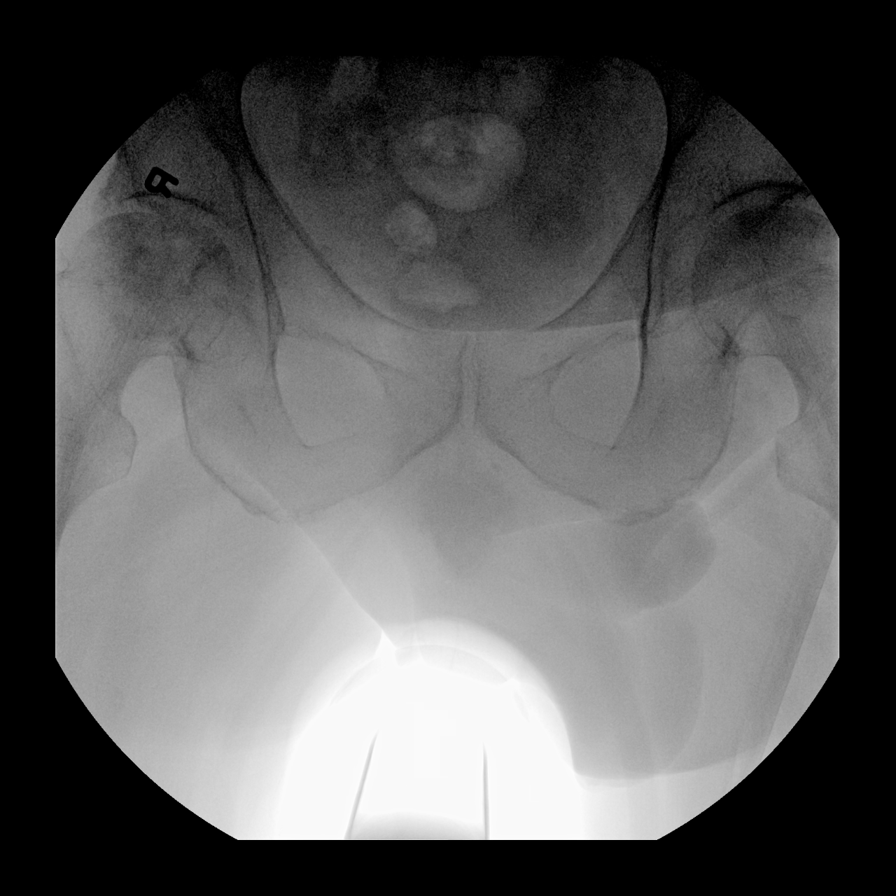
[im 3/7]
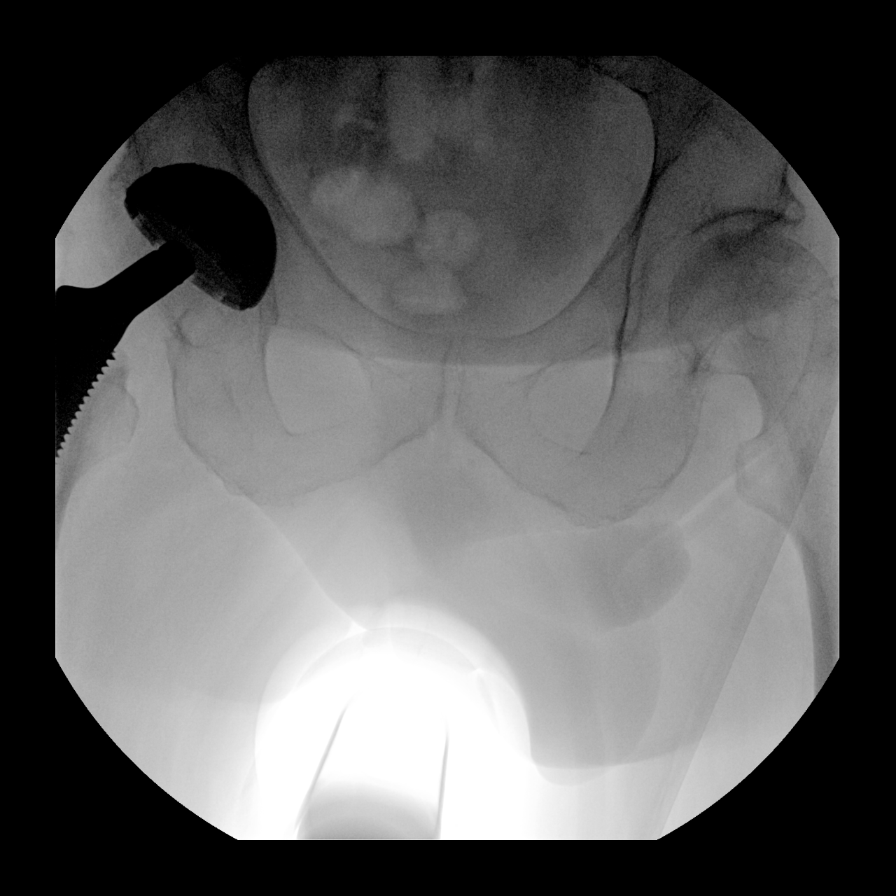
[im 4/7]
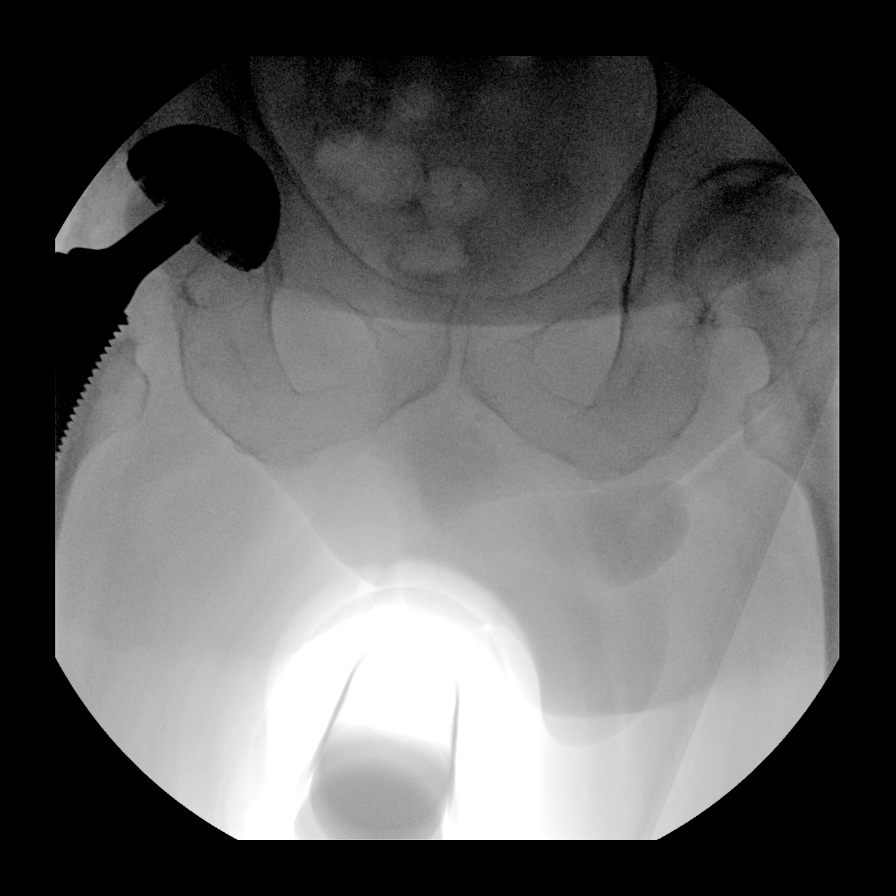
[im 5/7]
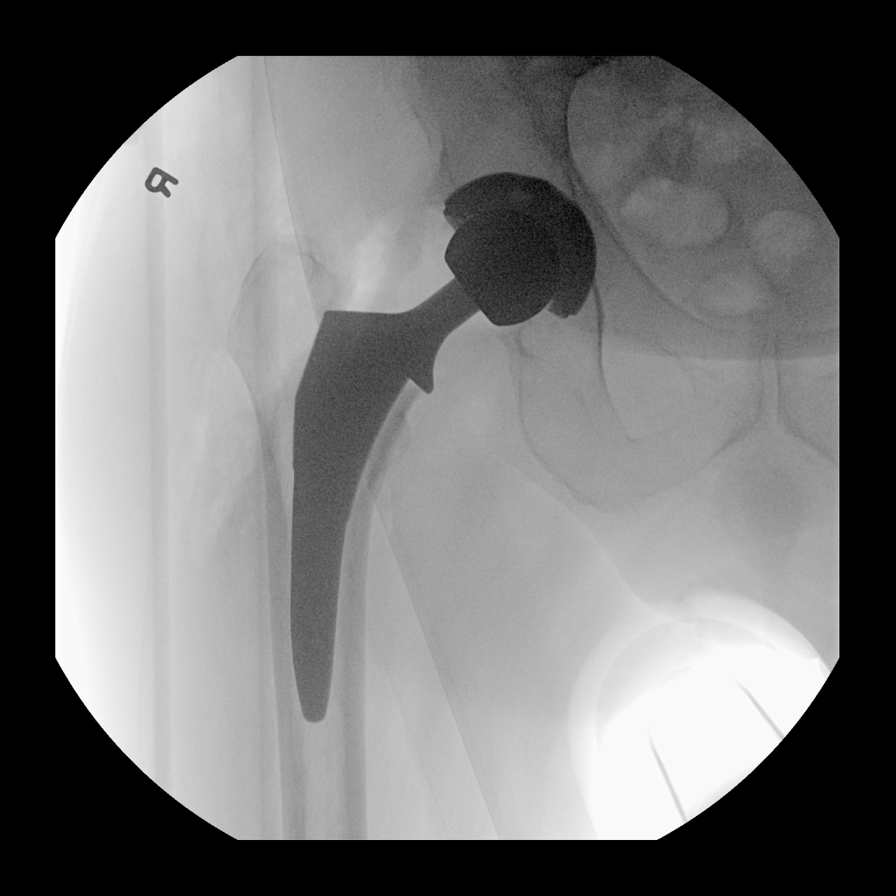
[im 6/7]
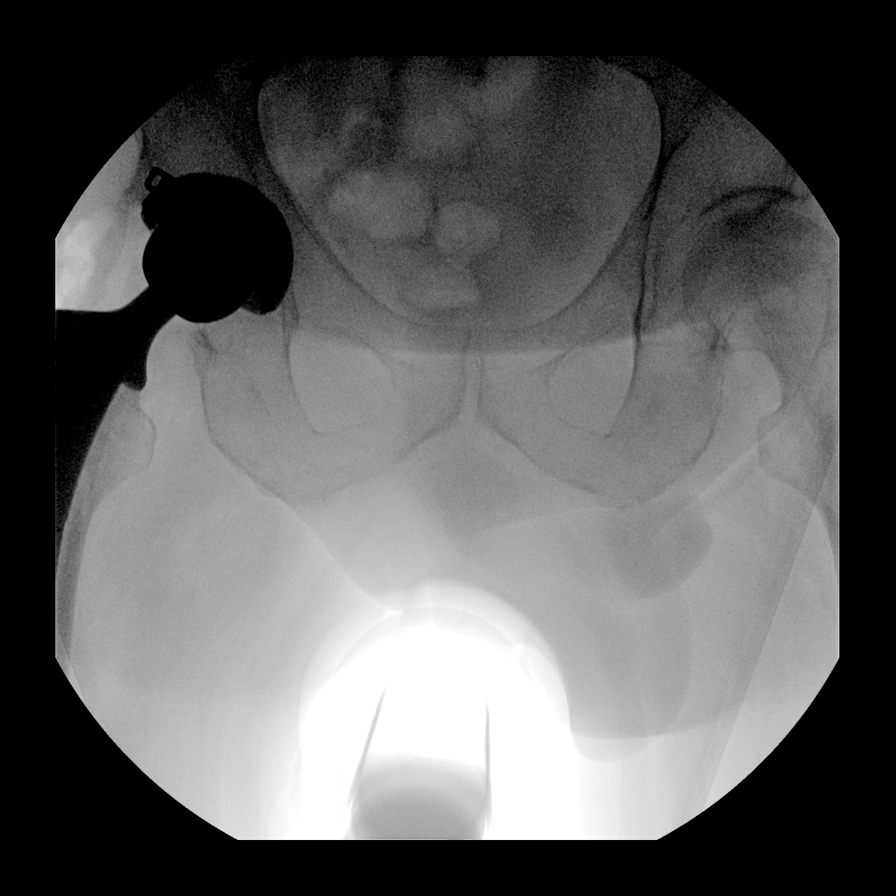
[im 7/7]
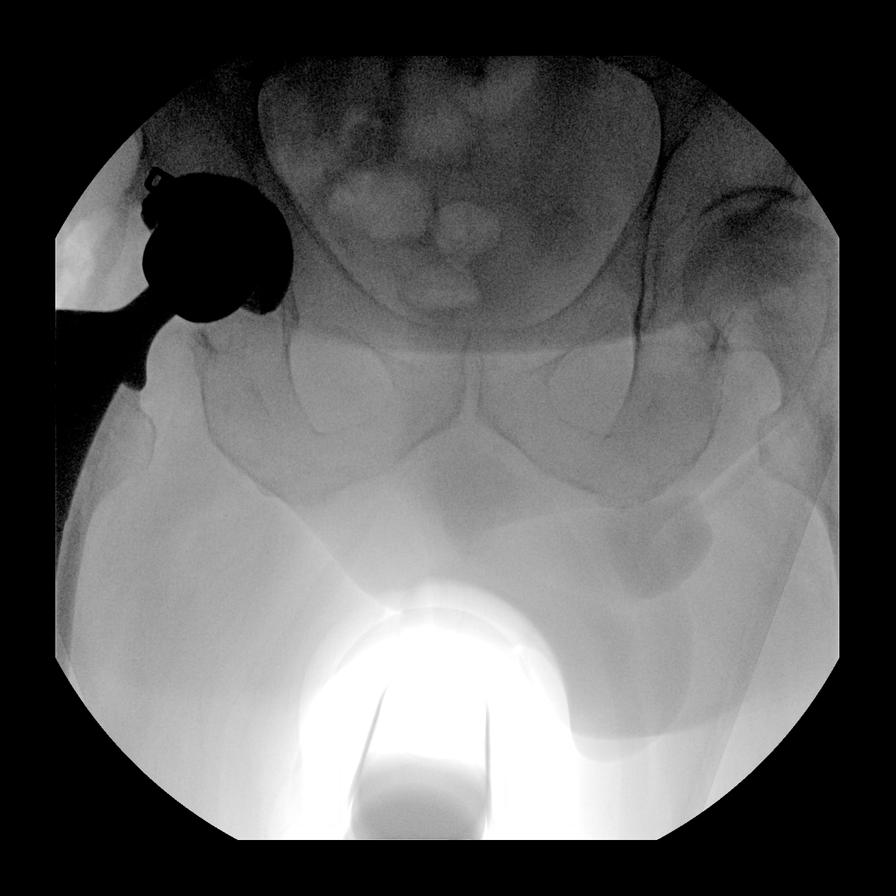

[7 of 7 positions shown; findings below may reference images not displayed]

FINDINGS: Total right hip replacement.  Hardware intact.  Anatomic alignment.
IMPRESSION: Total right hip replacement anatomic alignment.

## 2019-12-25 IMAGING — DX DG CHEST 1V PORT
1 series · 1 of 1 positions shown · non-contrast
Comparison: 03/06/2018

CLINICAL DATA: Shortness of breath

EXAM:
PORTABLE CHEST 1 VIEW

[chest ap]
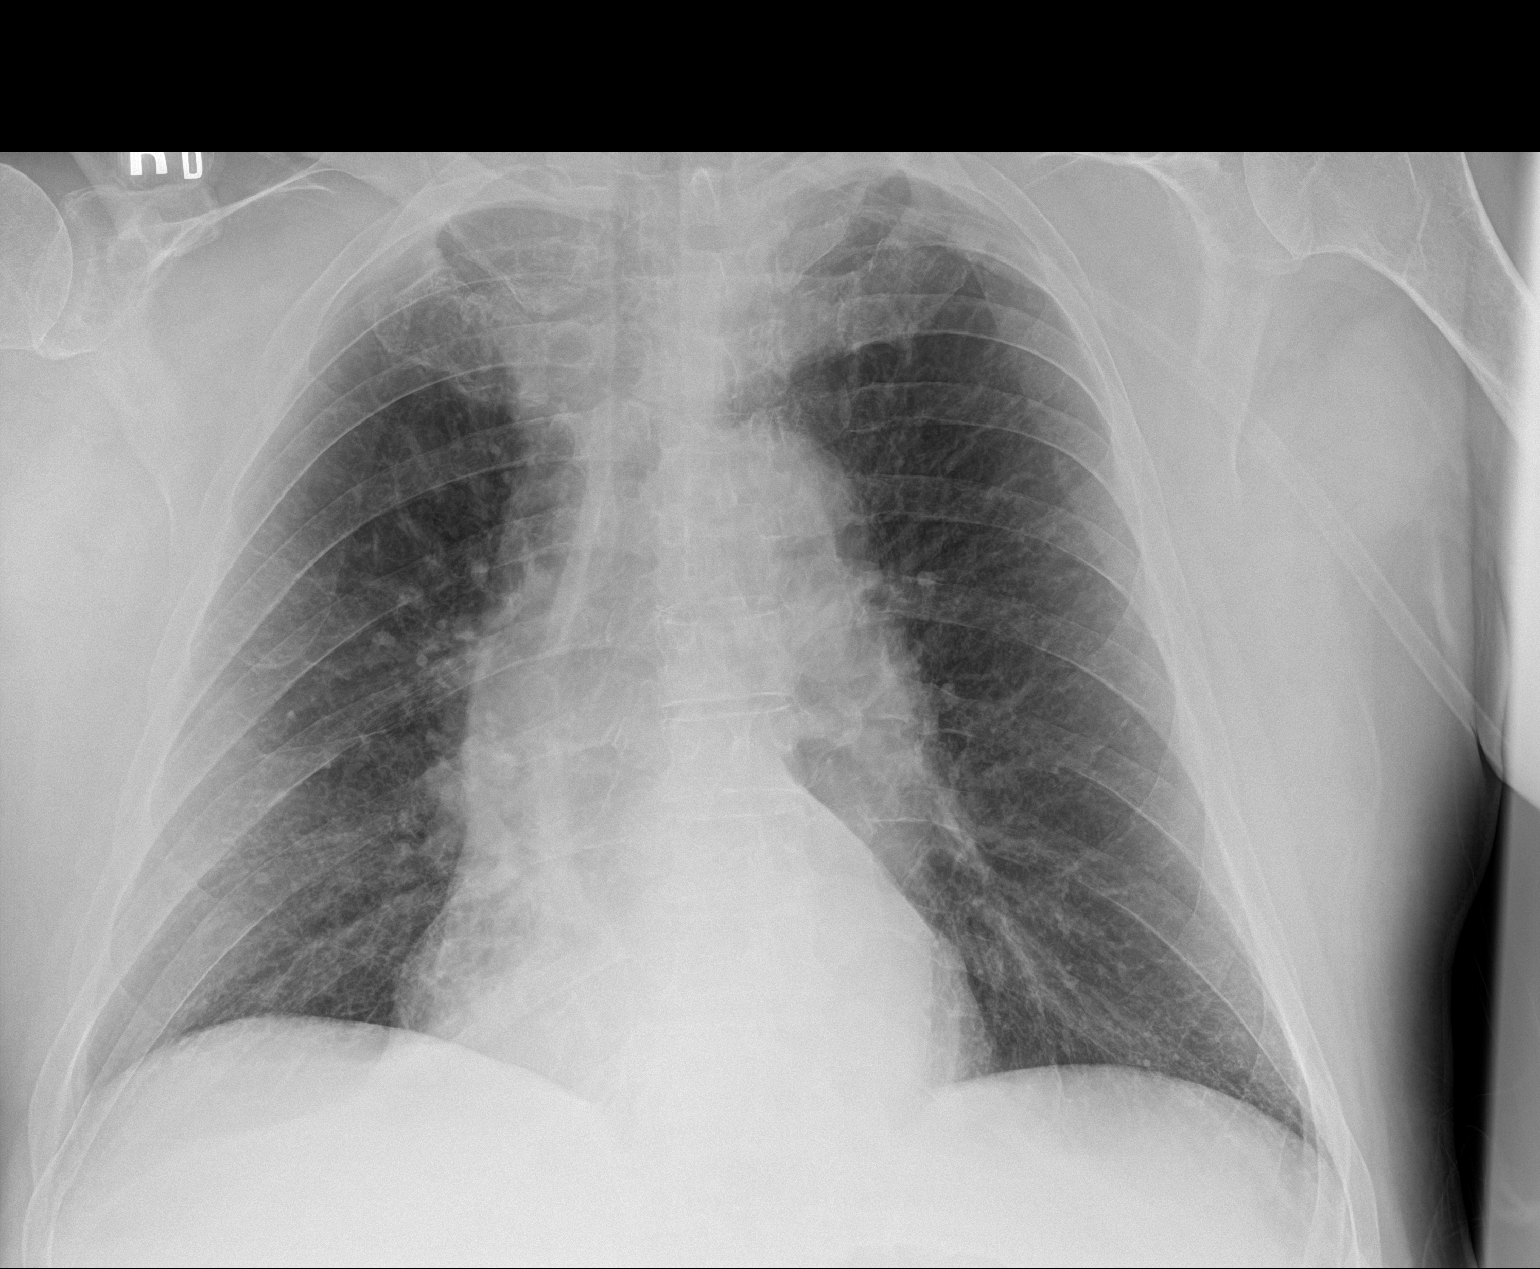

[1 of 1 positions shown; findings below may reference images not displayed]

FINDINGS: Heart and mediastinal contours are within normal limits. No focal
opacities or effusions. No acute bony abnormality.
IMPRESSION: No active disease.

## 2020-03-30 ENCOUNTER — Ambulatory Visit (INDEPENDENT_AMBULATORY_CARE_PROVIDER_SITE_OTHER): Payer: Medicare HMO | Admitting: Orthopaedic Surgery

## 2020-03-30 ENCOUNTER — Ambulatory Visit: Payer: Self-pay

## 2020-03-30 ENCOUNTER — Encounter: Payer: Self-pay | Admitting: Orthopaedic Surgery

## 2020-03-30 VITALS — Ht 68.0 in | Wt 191.0 lb

## 2020-03-30 DIAGNOSIS — Z96642 Presence of left artificial hip joint: Secondary | ICD-10-CM | POA: Diagnosis not present

## 2020-03-30 DIAGNOSIS — Z96641 Presence of right artificial hip joint: Secondary | ICD-10-CM | POA: Diagnosis not present

## 2020-03-30 DIAGNOSIS — Z96643 Presence of artificial hip joint, bilateral: Secondary | ICD-10-CM | POA: Diagnosis not present

## 2020-03-30 NOTE — Progress Notes (Signed)
The patient is now a year out from a right total hip arthroplasty and 7 months out from a left total hip arthroplasty.  He says he is doing well and has a little bit of achiness on the left hip when he does his long walks but overall it goes away with rest and he is doing well.  He has no complaints.  Examination both hips show that he moves smoothly and fluidly.  His leg lengths are equal.  AP pelvis shows well-seated bilateral total hip arthroplasties with no complicating features.  At this point follow-up can be as needed for his hips.  He understands that if there is any issues at all with either hip he needs to let us know.  I can also see him for other orthopedic complaints.  All questions and concerns were answered and addressed.

## 2020-07-24 ENCOUNTER — Encounter (HOSPITAL_BASED_OUTPATIENT_CLINIC_OR_DEPARTMENT_OTHER): Payer: Medicare HMO | Attending: Internal Medicine | Admitting: Internal Medicine

## 2020-07-24 ENCOUNTER — Other Ambulatory Visit: Payer: Self-pay

## 2020-07-24 DIAGNOSIS — Z880 Allergy status to penicillin: Secondary | ICD-10-CM | POA: Diagnosis not present

## 2020-07-24 DIAGNOSIS — L299 Pruritus, unspecified: Secondary | ICD-10-CM | POA: Diagnosis not present

## 2020-07-24 DIAGNOSIS — L409 Psoriasis, unspecified: Secondary | ICD-10-CM | POA: Diagnosis not present

## 2020-07-24 DIAGNOSIS — L98499 Non-pressure chronic ulcer of skin of other sites with unspecified severity: Secondary | ICD-10-CM | POA: Diagnosis not present

## 2020-07-24 DIAGNOSIS — Z87891 Personal history of nicotine dependence: Secondary | ICD-10-CM | POA: Diagnosis not present

## 2020-07-24 DIAGNOSIS — Z8546 Personal history of malignant neoplasm of prostate: Secondary | ICD-10-CM | POA: Diagnosis not present

## 2020-07-24 DIAGNOSIS — L281 Prurigo nodularis: Secondary | ICD-10-CM | POA: Diagnosis not present

## 2020-07-24 DIAGNOSIS — L97829 Non-pressure chronic ulcer of other part of left lower leg with unspecified severity: Secondary | ICD-10-CM | POA: Diagnosis not present

## 2020-07-24 NOTE — Progress Notes (Signed)
YOSHIO, SELIGA T ([id number]) . Visit Report for 07/24/2020 Abuse/Suicide Risk Screen Details Patient Name: Date of Service: FERREL, SIMINGTON NE T. 07/24/2020 1:15 PM Medical Record Number: [id number] Patient Account Number: [id number] Date of Birth/Sex: Treating RN: [birth year] (70 y.o. Erie Noe Primary Care Gildo Crisco: PCP, NO Other Clinician: Referring Viana Sleep: Treating Ludie Hudon/Extender: Cheree Ditto in Treatment: 0 Abuse/Suicide Risk Screen Items Answer ABUSE RISK SCREEN: Has anyone close to you tried to hurt or harm you recentlyo No Do you feel uncomfortable with anyone in your familyo No Has anyone forced you do things that you didnt want to doo No Electronic Signature(s) Signed: 07/24/2020 5:09:26 PM By: Rhae Hammock RN Entered By: Rhae Hammock on 07/24/2020 13:25:39 -------------------------------------------------------------------------------- Activities of Daily Living Details Patient Name: Date of Service: CHUN, SELLEN NE T. 07/24/2020 1:15 PM Medical Record Number: [id number] Patient Account Number: [id number] Date of Birth/Sex: Treating RN: [birth year] (70 y.o. Erie Noe Primary Care Mickie Badders: PCP, NO Other Clinician: Referring Giara Mcgaughey: Treating Jhalen Eley/Extender: Cheree Ditto in Treatment: 0 Activities of Daily Living Items Answer Activities of Daily Living (Please select one for each item) Drive Automobile Completely Able T Medications ake Completely Able Use T elephone Completely Able Care for Appearance Completely Able Use T oilet Completely Able Bath / Shower Completely Able Dress Self Completely Able Feed Self Completely Able Walk Completely Able Get In / Out Bed Completely Able Housework Completely Able Prepare Meals Completely [place] Completely Able Shop for Self Completely Able Electronic Signature(s) Signed: 07/24/2020 5:09:26 PM By: Rhae Hammock RN Entered By: Rhae Hammock on 07/24/2020  13:25:57 -------------------------------------------------------------------------------- Education Screening Details Patient Name: Date of Service: Emmaline Life NE T. 07/24/2020 1:15 PM Medical Record Number: [id number] Patient Account Number: [id number] Date of Birth/Sex: Treating RN: November 16, 1950 (70 y.o. Erie Noe Primary Care Judia Arnott: PCP, NO Other Clinician: Referring Augusta Hilbert: Treating Evette Diclemente/Extender: Cheree Ditto in Treatment: 0 Primary Learner Assessed: Patient Learning Preferences/Education Level/Primary Language Learning Preference: Explanation, Demonstration, Communication Board, Printed Material Highest Education Level: College or Above Preferred Language: English Cognitive Barrier Language Barrier: No Translator Needed: No Memory Deficit: No Emotional Barrier: No Cultural/Religious Beliefs Affecting Medical Care: No Physical Barrier Impaired Vision: Yes Glasses Impaired Hearing: Yes Hearing Aid Decreased Hand dexterity: No Knowledge/Comprehension Knowledge Level: High Comprehension Level: High Ability to understand written instructions: High Ability to understand verbal instructions: High Motivation Anxiety Level: Calm Cooperation: Cooperative Education Importance: Denies Need Interest in Health Problems: Asks Questions Perception: Coherent Willingness to Engage in Self-Management High Activities: Readiness to Engage in Self-Management High Activities: Electronic Signature(s) Signed: 07/24/2020 5:09:26 PM By: Rhae Hammock RN Entered By: Rhae Hammock on 07/24/2020 13:26:38 -------------------------------------------------------------------------------- Fall Risk Assessment Details Patient Name: Date of Service: Emmaline Life NE T. 07/24/2020 1:15 PM Medical Record Number: [id number] Patient Account Number: [id number] Date of Birth/Sex: Treating RN: 09-02-1950 (69 y.o. Burnadette Pop, Lauren Primary Care Nyela Cortinas: PCP, NO Other  Clinician: Referring Champion Corales: Treating Lillyan Hitson/Extender: Cheree Ditto in Treatment: 0 Fall Risk Assessment Items Have you had 2 or more falls in the last 12 monthso 0 No Have you had any fall that resulted in injury in the last 12 monthso 0 No FALLS RISK SCREEN History of falling - immediate or within 3 months 0 No Secondary diagnosis (Do you have 2 or more medical diagnoseso) 0 No Ambulatory aid None/bed rest/wheelchair/nurse 0 No Crutches/cane/walker 0 No Furniture 0 No Intravenous therapy Access/Saline/Heparin Lock 0 No Gait/Transferring Normal/ bed rest/ wheelchair 0 No Weak (short steps with or without shuffle, stooped  but able to lift head while walking, may seek 0 No support from furniture) Impaired (short steps with shuffle, may have difficulty arising from chair, head down, impaired 0 No balance) Mental Status Oriented to own ability 0 No Electronic Signature(s) Signed: 07/24/2020 5:09:26 PM By: Rhae Hammock RN Entered By: Rhae Hammock on 07/24/2020 13:27:39 -------------------------------------------------------------------------------- Foot Assessment Details Patient Name: Date of Service: Emmaline Life NE T. 07/24/2020 1:15 PM Medical Record Number: [id number] Patient Account Number: [id number] Date of Birth/Sex: Treating RN: 05-Jan-1951 (70 y.o. Erie Noe Primary Care Mykenzie Ebanks: PCP, NO Other Clinician: Referring Dietrick Barris: Treating Laparis Durrett/Extender: Cheree Ditto in Treatment: 0 Foot Assessment Items Site Locations + = Sensation present, - = Sensation absent, C = Callus, U = Ulcer R = Redness, W = Warmth, M = Maceration, PU = Pre-ulcerative lesion F = Fissure, S = Swelling, D = Dryness Assessment Right: Left: Other Deformity: No No Prior Foot Ulcer: No No Prior Amputation: No No Charcot Joint: No No Ambulatory Status: Ambulatory Without Help Gait: Steady Electronic Signature(s) Signed: 07/24/2020 5:09:26 PM By:  Rhae Hammock RN Entered By: Rhae Hammock on 07/24/2020 13:35:54 -------------------------------------------------------------------------------- Nutrition Risk Screening Details Patient Name: Date of Service: Emmaline Life NE T. 07/24/2020 1:15 PM Medical Record Number: [id number] Patient Account Number: [id number] Date of Birth/Sex: Treating RN: 1950/12/22 (70 y.o. Burnadette Pop, Lauren Primary Care Bessye Stith: PCP, NO Other Clinician: Referring Trivia Heffelfinger: Treating Kym Fenter/Extender: Cheree Ditto in Treatment: 0 Height (in): 68 Weight (lbs): 210 Body Mass Index (BMI): 31.9 Nutrition Risk Screening Items Score Screening NUTRITION RISK SCREEN: I have an illness or condition that made me change the kind and/or amount of food I eat 0 No I eat fewer than two meals per day 0 No I eat few fruits and vegetables, or milk products 0 No I have three or more drinks of beer, liquor or wine almost every day 0 No I have tooth or mouth problems that make it hard for me to eat 0 No I don't always have enough money to buy the food I need 0 No I eat alone most of the time 0 No I take three or more different prescribed or over-the-counter drugs a day 0 No Without wanting to, I have lost or gained 10 pounds in the last six months 0 No I am not always physically able to shop, cook and/or feed myself 0 No Nutrition Protocols Good Risk Protocol 0 No interventions needed Moderate Risk Protocol High Risk Proctocol Risk Level: Good Risk Score: 0 Electronic Signature(s) Signed: 07/24/2020 5:09:26 PM By: Rhae Hammock RN Entered By: Rhae Hammock on 07/24/2020 13:27:50

## 2020-07-24 NOTE — Progress Notes (Signed)
HOMMER, CUNLIFFE T ([id number]) . Visit Report for 07/24/2020 Allergy List Details Patient Name: Date of Service: JUBAL, RADEMAKER NE T. 07/24/2020 1:15 PM Medical Record Number: [id number] Patient Account Number: [id number] Date of Birth/Sex: Treating RN: 11-Jan-1951 (70 y.o. Erie Noe Primary Care Penina Reisner: PCP, NO Other Clinician: Referring Abdulai Blaylock: Treating Kalup Jaquith/Extender: Cheree Ditto in Treatment: 0 Allergies Active Allergies penicillin G Type: Food Allergy Notes Electronic Signature(s) Signed: 07/24/2020 5:09:26 PM By: Rhae Hammock RN Entered By: Rhae Hammock on 07/24/2020 13:26:57 -------------------------------------------------------------------------------- Arrival Information Details Patient Name: Date of Service: Emmaline Life NE T. 07/24/2020 1:15 PM Medical Record Number: [id number] Patient Account Number: [id number] Date of Birth/Sex: Treating RN: [birth year] (70 y.o. Erie Noe Primary Care Hines Kloss: PCP, NO Other Clinician: Referring Jatziry Wechter: Treating Monique Hefty/Extender: Cheree Ditto in Treatment: 0 Visit Information Patient Arrived: Ambulatory Arrival Time: 13:11 Accompanied By: self Transfer Assistance: None Patient Identification Verified: Yes Secondary Verification Process Completed: Yes Patient Requires Transmission-Based Precautions: No Patient Has Alerts: No Electronic Signature(s) Signed: 07/24/2020 5:09:26 PM By: Rhae Hammock RN Entered By: Rhae Hammock on 07/24/2020 13:16:10 -------------------------------------------------------------------------------- Clinic Level of Care Assessment Details Patient Name: Date of Service: GINO, GARRABRANT NE T. 07/24/2020 1:15 PM Medical Record Number: [id number] Patient Account Number: [id number] Date of Birth/Sex: Treating RN: Apr 23, 1951 (70 y.o. Janyth Contes Primary Care Rollan Roger: PCP, NO Other Clinician: Referring Jnya Brossard: Treating Maryrose Colvin/Extender: Cheree Ditto in Treatment: 0 Clinic Level of Care Assessment Items TOOL 2 Quantity Score X- 1 0 Use when only an EandM is performed on the INITIAL visit ASSESSMENTS - Nursing Assessment / Reassessment X- 1 20 General Physical Exam (combine w/ comprehensive assessment (listed just below) when performed on new pt. evals) X- 1 25 Comprehensive Assessment (HX, ROS, Risk Assessments, Wounds Hx, etc.) ASSESSMENTS - Wound and Skin A ssessment / Reassessment []  - 0 Simple Wound Assessment / Reassessment - one wound X- 3 5 Complex Wound Assessment / Reassessment - multiple wounds []  - 0 Dermatologic / Skin Assessment (not related to wound area) ASSESSMENTS - Ostomy and/or Continence Assessment and Care []  - 0 Incontinence Assessment and Management []  - 0 Ostomy Care Assessment and Management (repouching, etc.) PROCESS - Coordination of Care X - Simple Patient / Family Education for ongoing care 1 15 []  - 0 Complex (extensive) Patient / Family Education for ongoing care X- 1 10 Staff obtains Programmer, systems, Records, T Results / Process Orders est []  - 0 Staff telephones HHA, Nursing Homes / Clarify orders / etc []  - 0 Routine Transfer to another Facility (non-emergent condition) []  - 0 Routine Hospital Admission (non-emergent condition) X- 1 15 New Admissions / Biomedical engineer / Ordering NPWT Apligraf, etc. , []  - 0 Emergency Hospital Admission (emergent condition) X- 1 10 Simple Discharge Coordination []  - 0 Complex (extensive) Discharge Coordination PROCESS - Special Needs []  - 0 Pediatric / Minor Patient Management []  - 0 Isolation Patient Management []  - 0 Hearing / Language / Visual special needs []  - 0 Assessment of Community assistance (transportation, D/C planning, etc.) []  - 0 Additional assistance / Altered mentation []  - 0 Support Surface(s) Assessment (bed, cushion, seat, etc.) INTERVENTIONS - Wound Cleansing / Measurement X- 1 5 Wound Imaging  (photographs - any number of wounds) []  - 0 Wound Tracing (instead of photographs) []  - 0 Simple Wound Measurement - one wound X- 3 5 Complex Wound Measurement - multiple wounds []  - 0 Simple Wound Cleansing - one wound X- 3 5 Complex Wound Cleansing - multiple wounds INTERVENTIONS -  Wound Dressings []  - 0 Small Wound Dressing one or multiple wounds []  - 0 Medium Wound Dressing one or multiple wounds []  - 0 Large Wound Dressing one or multiple wounds []  - 0 Application of Medications - injection INTERVENTIONS - Miscellaneous []  - 0 External ear exam []  - 0 Specimen Collection (cultures, biopsies, blood, body fluids, etc.) []  - 0 Specimen(s) / Culture(s) sent or taken to Lab for analysis []  - 0 Patient Transfer (multiple staff / Civil Service fast streamer / Similar devices) []  - 0 Simple Staple / Suture removal (25 or less) []  - 0 Complex Staple / Suture removal (26 or more) []  - 0 Hypo / Hyperglycemic Management (close monitor of Blood Glucose) X- 1 15 Ankle / Brachial Index (ABI) - do not check if billed separately Has the patient been seen at the hospital within the last three years: Yes Total Score: 160 Level Of Care: New/Established - Level 5 Electronic Signature(s) Signed: 07/24/2020 5:50:50 PM By: Levan Hurst RN, BSN Entered By: Levan Hurst on 07/24/2020 15:23:22 -------------------------------------------------------------------------------- Lower Extremity Assessment Details Patient Name: Date of Service: Emmaline Life NE T. 07/24/2020 1:15 PM Medical Record Number: [id number] Patient Account Number: [id number] Date of Birth/Sex: Treating RN: [birth year] (70 y.o. Burnadette Pop, Lauren Primary Care Raeli Wiens: PCP, NO Other Clinician: Referring Palmyra Rogacki: Treating Kamren Heintzelman/Extender: Cheree Ditto in Treatment: 0 Edema Assessment Assessed: Shirlyn Goltz: Yes] [Right: No] Edema: [Left: N] [Right: o] Calf Left: Right: Point of Measurement: 36 cm From Medial Instep 39  cm Ankle Left: Right: Point of Measurement: 9 cm From Medial Instep 22 cm Knee To Floor Left: Right: From Medial Instep 40 cm Vascular Assessment Pulses: Dorsalis Pedis Palpable: [Left:Yes] Posterior Tibial Palpable: [Left:Yes] Blood Pressure: Brachial: [Left:161] Ankle: [Left:Dorsalis Pedis: 190 1.18] Electronic Signature(s) Signed: 07/24/2020 5:09:26 PM By: Rhae Hammock RN Entered By: Rhae Hammock on 07/24/2020 13:45:38 -------------------------------------------------------------------------------- Multi Wound Chart Details Patient Name: Date of Service: Emmaline Life NE T. 07/24/2020 1:15 PM Medical Record Number: [id number] Patient Account Number: [id number] Date of Birth/Sex: Treating RN: 1950-11-08 (70 y.o. Janyth Contes Primary Care Elise Gladden: PCP, NO Other Clinician: Referring Jaianna Nicoll: Treating Xochilt Conant/Extender: Cheree Ditto in Treatment: 0 Vital Signs Height(in): 68 Pulse(bpm): 20 Weight(lbs): 210 Blood Pressure(mmHg): 161/72 Body Mass Index(BMI): 32 Temperature(F): 98 Respiratory Rate(breaths/min): 49 Photos: [1:No Photos Left Upper Arm] [2:No Photos Left Shoulder] [3:No Photos Left, Lateral Lower Leg] Wound Location: [1:Gradually Appeared] [2:Gradually Appeared] [3:Gradually Appeared] Wounding Event: [1:T be determined o] [2:T be determined] [3:o] Primary Etiology: [1:Chronic Obstructive Pulmonary] [2:Chronic Obstructive Pulmonary] [3:Chronic Obstructive Pulmonary] Comorbid History: [1:Disease (COPD) 05/10/2020] [2:Disease (COPD) 05/09/2020] [3:Disease (COPD) 05/13/2020] Date Acquired: [1:0] [2:0] [3:0] Weeks of Treatment: [1:Open] [2:Open] [3:Open] Wound Status: [1:0.5x1x0.1] [2:0.5x0.5x0.1] [3:0.6x0.6x0.1] Measurements L x W x D (cm) [1:0.393] [2:0.196] [3:0.283] A (cm) : rea [1:0.039] [2:0.02] [3:0.028] Volume (cm) : [1:Full Thickness Without Exposed] [2:Full Thickness Without Exposed] [3:Full Thickness Without  Exposed] Classification: [1:Support Structures Medium] [2:Support Structures Medium] [3:Support Structures Medium] Exudate Amount: [1:Serosanguineous] [2:Serosanguineous] [3:Serosanguineous] Exudate Type: [1:red, brown] [2:red, brown] [3:red, brown] Exudate Color: [1:Distinct, outline attached] [2:Distinct, outline attached] [3:Distinct, outline attached] Wound Margin: [1:Large (67-100%)] [2:Large (67-100%)] [3:Large (67-100%)] Granulation Amount: [1:Red, Pink] [2:Red, Pink] [3:Red, Pink] Granulation Quality: [1:None Present (0%)] [2:None Present (0%)] [3:None Present (0%)] Necrotic Amount: [1:Fascia: No] [2:Fascia: No] [3:Fascia: No] Exposed Structures: [1:Fat Layer (Subcutaneous Tissue): No Tendon: No Muscle: No Joint: No Bone: No Small (1-33%)] [2:Fat Layer (Subcutaneous Tissue): No Tendon: No Muscle: No Joint: No Bone: No None] [3:Fat Layer (Subcutaneous Tissue): No Tendon:  No Muscle: No Joint: No  Bone: No Small (1-33%)] Treatment Notes Electronic Signature(s) Signed: 07/24/2020 5:50:50 PM By: Levan Hurst RN, BSN Signed: 07/24/2020 5:51:45 PM By: Linton Ham MD Entered By: Linton Ham on 07/24/2020 15:11:38 -------------------------------------------------------------------------------- Pain Assessment Details Patient Name: Date of Service: Emmaline Life NE T. 07/24/2020 1:15 PM Medical Record Number: [id number] Patient Account Number: [id number] Date of Birth/Sex: Treating RN: 1950-09-19 (70 y.o. Erie Noe Primary Care Sharra Cayabyab: PCP, NO Other Clinician: Referring Emie Sommerfeld: Treating Hasini Peachey/Extender: Cheree Ditto in Treatment: 0 Active Problems Location of Pain Severity and Description of Pain Patient Has Paino No Site Locations Rate the pain. Current Pain Level: 0 Pain Management and Medication Current Pain Management: Electronic Signature(s) Signed: 07/24/2020 5:09:26 PM By: Rhae Hammock RN Entered By: Rhae Hammock on 07/24/2020  13:28:18 -------------------------------------------------------------------------------- Patient/Caregiver Education Details Patient Name: Date of Service: Emmaline Life NE T. 2/14/2022andnbsp1:15 PM Medical Record Number: [id number] Patient Account Number: [id number] Date of Birth/Gender: Treating RN: Mar 18, 1951 (70 y.o. Janyth Contes Primary Care Physician: PCP, NO Other Clinician: Referring Physician: Treating Physician/Extender: Cheree Ditto in Treatment: 0 Education Assessment Education Provided To: Patient Education Topics Provided Wound/Skin Impairment: Methods: Explain/Verbal Responses: State content correctly Electronic Signature(s) Signed: 07/24/2020 5:50:50 PM By: Levan Hurst RN, BSN Entered By: Levan Hurst on 07/24/2020 15:22:14 -------------------------------------------------------------------------------- Wound Assessment Details Patient Name: Date of Service: Emmaline Life NE T. 07/24/2020 1:15 PM Medical Record Number: [id number] Patient Account Number: [id number] Date of Birth/Sex: Treating RN: 24-Oct-1950 (70 y.o. Burnadette Pop, Lauren Primary Care Lakin Rhine: PCP, NO Other Clinician: Referring William Laske: Treating Sharde Gover/Extender: Cheree Ditto in Treatment: 0 Wound Status Wound Number: 1 Primary Etiology: T be determined o Wound Location: Left Upper Arm Wound Status: Open Wounding Event: Gradually Appeared Comorbid History: Chronic Obstructive Pulmonary Disease (COPD) Date Acquired: 05/10/2020 Weeks Of Treatment: 0 Clustered Wound: No Wound Measurements Length: (cm) 0.5 Width: (cm) 1 Depth: (cm) 0.1 Area: (cm) 0.393 Volume: (cm) 0.039 % Reduction in Area: % Reduction in Volume: Epithelialization: Small (1-33%) Tunneling: No Undermining: No Wound Description Classification: Full Thickness Without Exposed Support Structures Wound Margin: Distinct, outline attached Exudate Amount: Medium Exudate Type:  Serosanguineous Exudate Color: red, brown Foul Odor After Cleansing: No Slough/Fibrino No Wound Bed Granulation Amount: Large (67-100%) Exposed Structure Granulation Quality: Red, Pink Fascia Exposed: No Necrotic Amount: None Present (0%) Fat Layer (Subcutaneous Tissue) Exposed: No Tendon Exposed: No Muscle Exposed: No Joint Exposed: No Bone Exposed: No Electronic Signature(s) Signed: 07/24/2020 5:09:26 PM By: Rhae Hammock RN Entered By: Rhae Hammock on 07/24/2020 13:48:35 -------------------------------------------------------------------------------- Wound Assessment Details Patient Name: Date of Service: Emmaline Life NE T. 07/24/2020 1:15 PM Medical Record Number: [id number] Patient Account Number: [id number] Date of Birth/Sex: Treating RN: June 16, 1950 (70 y.o. Burnadette Pop, Lauren Primary Care Belissa Kooy: PCP, NO Other Clinician: Referring Wandra Babin: Treating Karisha Marlin/Extender: Cheree Ditto in Treatment: 0 Wound Status Wound Number: 2 Primary Etiology: T be determined o Wound Location: Left Shoulder Wound Status: Open Wounding Event: Gradually Appeared Comorbid History: Chronic Obstructive Pulmonary Disease (COPD) Date Acquired: 05/09/2020 Weeks Of Treatment: 0 Clustered Wound: No Wound Measurements Length: (cm) 0.5 Width: (cm) 0.5 Depth: (cm) 0.1 Area: (cm) 0.196 Volume: (cm) 0.02 % Reduction in Area: % Reduction in Volume: Epithelialization: None Tunneling: No Undermining: No Wound Description Classification: Full Thickness Without Exposed Support Structures Wound Margin: Distinct, outline attached Exudate Amount: Medium Exudate Type: Serosanguineous Exudate Color: red, brown Foul Odor After Cleansing: No Slough/Fibrino No Wound Bed Granulation Amount: Large (67-100%) Exposed Structure Granulation Quality: Red, Pink  Fascia Exposed: No Necrotic Amount: None Present (0%) Fat Layer (Subcutaneous Tissue) Exposed: No Tendon Exposed:  No Muscle Exposed: No Joint Exposed: No Bone Exposed: No Electronic Signature(s) Signed: 07/24/2020 5:09:26 PM By: Rhae Hammock RN Entered By: Rhae Hammock on 07/24/2020 13:49:38 -------------------------------------------------------------------------------- Wound Assessment Details Patient Name: Date of Service: Emmaline Life NE T. 07/24/2020 1:15 PM Medical Record Number: [id number] Patient Account Number: [id number] Date of Birth/Sex: Treating RN: [birth year] (70 y.o. Burnadette Pop, Lauren Primary Care Cambria Osten: PCP, NO Other Clinician: Referring Juliann Olesky: Treating Dardan Shelton/Extender: Cheree Ditto in Treatment: 0 Wound Status Wound Number: 3 Primary Etiology: T be determined o Wound Location: Left, Lateral Lower Leg Wound Status: Open Wounding Event: Gradually Appeared Comorbid History: Chronic Obstructive Pulmonary Disease (COPD) Date Acquired: 05/13/2020 Weeks Of Treatment: 0 Clustered Wound: No Wound Measurements Length: (cm) 0.6 Width: (cm) 0.6 Depth: (cm) 0.1 Area: (cm) 0.283 Volume: (cm) 0.028 % Reduction in Area: % Reduction in Volume: Epithelialization: Small (1-33%) Tunneling: No Undermining: No Wound Description Classification: Full Thickness Without Exposed Support Structures Wound Margin: Distinct, outline attached Exudate Amount: Medium Exudate Type: Serosanguineous Exudate Color: red, brown Foul Odor After Cleansing: No Slough/Fibrino No Wound Bed Granulation Amount: Large (67-100%) Exposed Structure Granulation Quality: Red, Pink Fascia Exposed: No Necrotic Amount: None Present (0%) Fat Layer (Subcutaneous Tissue) Exposed: No Tendon Exposed: No Muscle Exposed: No Joint Exposed: No Bone Exposed: No Electronic Signature(s) Signed: 07/24/2020 5:09:26 PM By: Rhae Hammock RN Entered By: Rhae Hammock on 07/24/2020 13:50:34 -------------------------------------------------------------------------------- [place]  Details Patient Name: Date of Service: Emmaline Life NE T. 07/24/2020 1:15 PM Medical Record Number: [id number] Patient Account Number: [id number] Date of Birth/Sex: Treating RN: 05-Sep-1950 (70 y.o. Burnadette Pop, Lauren Primary Care Alayiah Fontes: PCP, NO Other Clinician: Referring Alaze Garverick: Treating Zayon Trulson/Extender: Cheree Ditto in Treatment: 0 Vital Signs Time Taken: 13:16 Temperature (F): 98 Height (in): 68 Pulse (bpm): 74 Source: Stated Respiratory Rate (breaths/min): 17 Weight (lbs): 210 Blood Pressure (mmHg): 161/72 Source: Stated Reference Range: 80 - 120 mg / dl Body Mass Index (BMI): 31.9 Electronic Signature(s) Signed: 07/24/2020 5:09:26 PM By: Rhae Hammock RN Entered By: Rhae Hammock on 07/24/2020 13:20:31

## 2020-07-24 NOTE — Progress Notes (Signed)
Steve Riley, Steve Riley ([id number]) . Visit Report for 07/24/2020 Chief Complaint Document Details Patient Name: Date of Service: Steve Riley, Steve NE Riley. 07/24/2020 1:15 PM Medical Record Number: [id number] Patient Account Number: [id number] Date of Birth/Sex: Treating RN: [birth year] (70 y.o. Janyth Contes Primary Care Provider: PCP, NO Other Clinician: Referring Provider: Treating Provider/Extender: Cheree Ditto in Treatment: 0 Information Obtained from: Patient Chief Complaint 07/24/2020; patient is here for review of multiple areas mostly on his upper arms but also on his left lower leg in the setting of chronic pruritus Electronic Signature(s) Signed: 07/24/2020 5:51:45 PM By: Linton Ham MD Entered By: Linton Ham on 07/24/2020 15:12:15 -------------------------------------------------------------------------------- HPI Details Patient Name: Date of Service: Steve Life NE Riley. 07/24/2020 1:15 PM Medical Record Number: [id number] Patient Account Number: [id number] Date of Birth/Sex: Treating RN: September 10, 1950 (70 y.o. 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 He says he has had this problem for as long as 14 years. He is constantly picking or scratching at his skin. He has been seen by dermatology at the [facility] in [place] and followed there for a period of time. He tells me a year ago he was seen at [facility] dermatology for 6 visits. Diagnosis of prurigo nodularis. He is not aware of any kidney liver or thyroid problems. I see he has psoriasis on one of his problem lists. I believe he was self-referred. The areas he is concerned about are mostly on the left upper arm and left anterior lower leg although in reality he is  complaining of pruritus in both arms posterior neck at his hairline. Past medical history includes left total hip, COPD, right total hip and history of prostate cancer Electronic Signature(s) Signed: 07/24/2020 5:51:45 PM By: Linton Ham MD Entered By: Linton Ham on 07/24/2020 15:14:25 -------------------------------------------------------------------------------- Physical Exam Details Patient Name: Date of Service: Steve Life NE Riley. 07/24/2020 1:15 PM Medical Record Number: [id number] Patient Account Number: [id number] Date of Birth/Sex: Treating RN: [birth year] (70 y.o. Janyth Contes Primary Care Provider: PCP, NO Other Clinician: Referring Provider: Treating Provider/Extender: Cheree Ditto in Treatment: 0 Constitutional Patient is hypertensive.. Pulse regular and within target range for patient.Marland Kitchen Respirations regular, non-labored and within target range.. Temperature is normal and within the target range for the patient.Marland Kitchen Appears in no distress. Respiratory work of breathing is normal. Cardiovascular Pedal pulses palpable and strong bilaterally.. Gastrointestinal (GI) Distended but no shifting dullness no palpable liver or. No liver or spleen enlargement. Integumentary (Hair, Skin) Innumerable number of scars especially in his upper arms from a long history of scratching he has a few lesions they are not really nodular more papules. 1 open area on the left anterior lower leg. Electronic Signature(s) Signed: 07/24/2020 5:51:45 PM By: Linton Ham MD Entered By: Linton Ham on 07/24/2020 15:17:19 -------------------------------------------------------------------------------- Physician Orders Details Patient Name: Date of Service: Steve Life NE Riley. 07/24/2020 1:15 PM Medical Record Number: [id number] Patient Account Number: [id number] Date of Birth/Sex: Treating RN: [birth year] (70 y.o. Janyth Contes Primary Care Provider: PCP, NO Other  Clinician: Referring Provider: Treating Provider/Extender: Cheree Ditto in Treatment: 0 Verbal / Phone Orders: No Diagnosis Coding Discharge From [facility] Services Discharge from [place] - follow up with Dermatology and Primary doctor Additional Orders / Instructions Other: - Apply moisturizing lotion (  Cetaphil, Eucerin, Gold bond etc.) to arms and legs twice a day. Call Primary doctor or Dermatology for possible topical steroid to apply along with moisturizing lotion. Wound Treatment Electronic Signature(s) Signed: 07/24/2020 5:50:50 PM By: Levan Hurst RN, BSN Signed: 07/24/2020 5:51:45 PM By: Linton Ham MD Entered By: Levan Hurst on 07/24/2020 14:35:22 -------------------------------------------------------------------------------- Problem List Details Patient Name: Date of Service: Steve Life NE Riley. 07/24/2020 1:15 PM Medical Record Number: [id number] Patient Account Number: [id number] Date of Birth/Sex: Treating RN: 1950-08-03 (70 y.o. Janyth Contes Primary Care Provider: PCP, NO Other Clinician: Referring Provider: Treating Provider/Extender: Cheree Ditto in Treatment: 0 Active Problems ICD-10 Encounter Code Description Active Date MDM Diagnosis L28.1 Prurigo nodularis 07/24/2020 No Yes L29.8 Other pruritus 07/24/2020 No Yes R74.01 Elevation of levels of liver transaminase levels 07/24/2020 No Yes Inactive Problems Resolved Problems Electronic Signature(s) Signed: 07/24/2020 5:51:45 PM By: Linton Ham MD Entered By: Linton Ham on 07/24/2020 14:43:57 -------------------------------------------------------------------------------- Progress Note Details Patient Name: Date of Service: Steve Life NE Riley. 07/24/2020 1:15 PM Medical Record Number: [id number] Patient Account Number: [id number] Date of Birth/Sex: Treating RN: [birth year] (70 y.o. 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 He says he has had this problem for as long as 14 years. He is constantly picking or scratching at his skin. He has been seen by dermatology at the [facility] in [place] and followed there for a period of time. He tells me a year ago he was seen at [facility] dermatology for 6 visits. Diagnosis of prurigo nodularis. He is not aware of any kidney liver or thyroid problems. I see he has psoriasis on one of his problem lists. I believe he was self-referred. The areas he is concerned about are mostly on the left upper arm and left anterior lower leg although in reality he is complaining of pruritus in both arms posterior neck at his hairline. Past medical history includes left total hip, COPD, right total hip and history of prostate cancer Patient History Information obtained from Patient. Allergies penicillin G Family History Unknown History. Social History Former smoker, Alcohol Use - Never, Drug Use - No History, Caffeine Use - Daily - coffee. Medical History Eyes Denies history of Cataracts, Glaucoma, Optic Neuritis Ear/Nose/Mouth/Throat Denies history of Chronic sinus problems/congestion, Middle ear problems Hematologic/Lymphatic Denies history of Anemia, Hemophilia, Human Immunodeficiency Virus, Lymphedema, Sickle Cell Disease Respiratory Patient has history of Chronic Obstructive Pulmonary Disease (COPD) Denies history of Aspiration, Asthma, Pneumothorax, Sleep Apnea, Tuberculosis Cardiovascular Denies history of Angina, Arrhythmia, Congestive Heart Failure,  Coronary Artery Disease, Deep Vein Thrombosis, Hypertension, Hypotension, Myocardial Infarction, Peripheral Arterial Disease, Peripheral Venous Disease, Phlebitis, Vasculitis Gastrointestinal Denies history of Cirrhosis , Colitis, Crohnoos, Hepatitis A, Hepatitis B, Hepatitis C Endocrine Denies history of Type I Diabetes, Type II Diabetes Genitourinary Denies history of End Stage Renal Disease Immunological Denies history of Lupus Erythematosus, Raynaudoos, Scleroderma Integumentary (Skin) Denies history of History of Burn Musculoskeletal Denies history of Gout, Rheumatoid Arthritis, Osteoarthritis, Osteomyelitis Neurologic Denies history of Dementia, Neuropathy, Quadriplegia, Paraplegia, Seizure Disorder Oncologic Denies history of Received Chemotherapy, Received Radiation Psychiatric Denies history of Anorexia/bulimia,  Confinement Anxiety Hospitalization/Surgery History - hip surgery x 3 (2 left and 1 right). - back surgery. - hand surgery. - pancreas biopsy. Medical A Surgical History Notes nd Oncologic hx of pancreas ca; received chemo and radiation Review of Systems (ROS) Constitutional Symptoms (General Health) Denies complaints or symptoms of Fatigue, Fever, Chills, Marked Weight Change. Eyes Complains or has symptoms of Glasses / Contacts. Denies complaints or symptoms of Dry Eyes, Vision Changes. Ear/Nose/Mouth/Throat Denies complaints or symptoms of Chronic sinus problems or rhinitis. Respiratory Denies complaints or symptoms of Chronic or frequent coughs, Shortness of Breath. Cardiovascular Denies complaints or symptoms of Chest pain. Gastrointestinal Denies complaints or symptoms of Frequent diarrhea, Nausea, Vomiting. Endocrine Denies complaints or symptoms of Heat/cold intolerance. Genitourinary Denies complaints or symptoms of Frequent urination. Musculoskeletal Denies complaints or symptoms of Muscle Pain, Muscle Weakness. Neurologic Denies complaints  or symptoms of Numbness/parasthesias. Psychiatric Denies complaints or symptoms of Claustrophobia, Suicidal. Objective Constitutional Patient is hypertensive.. Pulse regular and within target range for patient.Marland Kitchen Respirations regular, non-labored and within target range.. Temperature is normal and within the target range for the patient.Marland Kitchen Appears in no distress. Vitals Time Taken: 1:16 PM, Height: 68 in, Source: Stated, Weight: 210 lbs, Source: Stated, BMI: 31.9, Temperature: 98 F, Pulse: 74 bpm, Respiratory Rate: 17 breaths/min, Blood Pressure: 161/72 mmHg. Respiratory work of breathing is normal. Cardiovascular Pedal pulses palpable and strong bilaterally.. Gastrointestinal (GI) Distended but no shifting dullness no palpable liver or. No liver or spleen enlargement. Integumentary (Hair, Skin) Innumerable number of scars especially in his upper arms from a long history of scratching he has a few lesions they are not really nodular more papules. 1 open area on the left anterior lower leg. Wound #1 status is Open. Original cause of wound was Gradually Appeared. The wound is located on the Left Upper Arm. The wound measures 0.5cm length x 1cm width x 0.1cm depth; 0.393cm^2 area and 0.039cm^3 volume. There is no tunneling or undermining noted. There is a medium amount of serosanguineous drainage noted. The wound margin is distinct with the outline attached to the wound base. There is large (67-100%) red, pink granulation within the wound bed. There is no necrotic tissue within the wound bed. Wound #2 status is Open. Original cause of wound was Gradually Appeared. The wound is located on the Left Shoulder. The wound measures 0.5cm length x 0.5cm width x 0.1cm depth; 0.196cm^2 area and 0.02cm^3 volume. There is no tunneling or undermining noted. There is a medium amount of serosanguineous drainage noted. The wound margin is distinct with the outline attached to the wound base. There is large  (67-100%) red, pink granulation within the wound bed. There is no necrotic tissue within the wound bed. Wound #3 status is Open. Original cause of wound was Gradually Appeared. The wound is located on the Left,Lateral Lower Leg. The wound measures 0.6cm length x 0.6cm width x 0.1cm depth; 0.283cm^2 area and 0.028cm^3 volume. There is no tunneling or undermining noted. There is a medium amount of serosanguineous drainage noted. The wound margin is distinct with the outline attached to the wound base. There is large (67-100%) red, pink granulation within the wound bed. There is no necrotic tissue within the wound bed. Assessment Active Problems ICD-10 Prurigo nodularis Other pruritus Elevation of levels of liver transaminase levels Plan Discharge From [facility] Services: Discharge from [place] - follow up with Dermatology and Primary doctor Additional Orders / Instructions: Other: - Apply moisturizing lotion (Cetaphil, Eucerin, Gold bond etc.)  to arms and legs twice a day. Call Primary doctor or Dermatology for possible topical steroid to apply along with moisturizing lotion. 1. Generalized pruritus. This has been going on for 14 years according to the patient. Looking through his record I was able to find that in 2019 he had to comprehensive metabolic panel showing mildly elevated ALT and AST's in the 100-200 range. The patient was unaware of this. This should probably be followed up as chronic liver disease can cause generalized pruritus. Also a TSH screen, CAP CBC etc. 2. I do not think we are going to be able to help this man. Multiple excoriations scars innumerable areas especially on his shoulders and upper arms. 3 I recommended just an over-the-counter moisturizer. This could be easily mixed with some triamcinolone to see if this would help on one localized area perhaps his left upper arm. This could be prescribed by his primary doctor or one of his 2 dermatologists 4 back  to his primary doctor in follow-up for the elevated transaminase levels 3 years ago Electronic Signature(s) Signed: 07/24/2020 5:51:45 PM By: Linton Ham MD Entered By: Linton Ham on 07/24/2020 15:20:10 -------------------------------------------------------------------------------- HxROS Details Patient Name: Date of Service: Steve Life NE Riley. 07/24/2020 1:15 PM Medical Record Number: [id number] Patient Account Number: [id number] Date of Birth/Sex: Treating RN: [birth year] (70 y.o. Steve Riley Primary Care Provider: PCP, NO Other Clinician: Referring Provider: Treating Provider/Extender: Cheree Ditto in Treatment: 0 Information Obtained From Patient Constitutional Symptoms (General Health) Complaints and Symptoms: Negative for: Fatigue; Fever; Chills; Marked Weight Change Eyes Complaints and Symptoms: Positive for: Glasses / Contacts Negative for: Dry Eyes; Vision Changes Medical History: Negative for: Cataracts; Glaucoma; Optic Neuritis Ear/Nose/Mouth/Throat Complaints and Symptoms: Negative for: Chronic sinus problems or rhinitis Medical History: Negative for: Chronic sinus problems/congestion; Middle ear problems Respiratory Complaints and Symptoms: Negative for: Chronic or frequent coughs; Shortness of Breath Medical History: Positive for: Chronic Obstructive Pulmonary Disease (COPD) Negative for: Aspiration; Asthma; Pneumothorax; Sleep Apnea; Tuberculosis Cardiovascular Complaints and Symptoms: Negative for: Chest pain Medical History: Negative for: Angina; Arrhythmia; Congestive Heart Failure; Coronary Artery Disease; Deep Vein Thrombosis; Hypertension; Hypotension; Myocardial Infarction; Peripheral Arterial Disease; Peripheral Venous Disease; Phlebitis; Vasculitis Gastrointestinal Complaints and Symptoms: Negative for: Frequent diarrhea; Nausea; Vomiting Medical History: Negative for: Cirrhosis ; Colitis; Crohns; Hepatitis A; Hepatitis B;  Hepatitis C Endocrine Complaints and Symptoms: Negative for: Heat/cold intolerance Medical History: Negative for: Type I Diabetes; Type II Diabetes Genitourinary Complaints and Symptoms: Negative for: Frequent urination Medical History: Negative for: End Stage Renal Disease Musculoskeletal Complaints and Symptoms: Negative for: Muscle Pain; Muscle Weakness Medical History: Negative for: Gout; Rheumatoid Arthritis; Osteoarthritis; Osteomyelitis Neurologic Complaints and Symptoms: Negative for: Numbness/parasthesias Medical History: Negative for: Dementia; Neuropathy; Quadriplegia; Paraplegia; Seizure Disorder Psychiatric Complaints and Symptoms: Negative for: Claustrophobia; Suicidal Medical History: Negative for: Anorexia/bulimia; Confinement Anxiety Hematologic/Lymphatic Medical History: Negative for: Anemia; Hemophilia; Human Immunodeficiency Virus; Lymphedema; Sickle Cell Disease Immunological Medical History: Negative for: Lupus Erythematosus; Raynauds; Scleroderma Integumentary (Skin) Medical History: Negative for: History of Burn Oncologic Medical History: Negative for: Received Chemotherapy; Received Radiation Past Medical History Notes: hx of pancreas ca; received chemo and radiation Immunizations Pneumococcal Vaccine: Received Pneumococcal Vaccination: No Implantable Devices None Hospitalization / Surgery History Type of Hospitalization/Surgery hip surgery x 3 (2 left and 1 right) back surgery hand surgery pancreas biopsy Family and Social History Unknown History: Yes; Former smoker; Alcohol Use: Never; Drug Use: No History; Caffeine Use: Daily - coffee; Financial Concerns: No; Food, Clothing or Shelter Needs:  No; Support System Lacking: No; Transportation Concerns: No Electronic Signature(s) Signed: 07/24/2020 5:09:26 PM By: Rhae Hammock RN Signed: 07/24/2020 5:51:45 PM By: Linton Ham MD Entered By: Rhae Hammock on 07/24/2020  13:25:30 -------------------------------------------------------------------------------- SuperBill Details Patient Name: Date of Service: Steve Life NE Riley. 07/24/2020 Medical Record Number: [id number] Patient Account Number: [id number] Date of Birth/Sex: Treating RN: 12-Jul-1950 (70 y.o. Janyth Contes Primary Care Provider: PCP, NO Other Clinician: Referring Provider: Treating Provider/Extender: Cheree Ditto in Treatment: 0 Diagnosis Coding ICD-10 Codes Code Description L28.1 Prurigo nodularis L29.8 Other pruritus R74.01 Elevation of levels of liver transaminase levels Facility Procedures CPT4 Code: [id number] Description: [phone] - WOUND CARE VISIT-LEV 5 EST PT Modifier: Quantity: 1 Physician Procedures : CPT4 Code Description Modifier [id number] - WC PHYS LEVEL 2 - NEW PT ICD-10 Diagnosis Description L28.1 Prurigo nodularis L29.8 Other pruritus R74.01 Elevation of levels of liver transaminase levels Quantity: 1 Electronic Signature(s) Signed: 07/24/2020 5:50:50 PM By: Levan Hurst RN, BSN Signed: 07/24/2020 5:51:45 PM By: Linton Ham MD Entered By: Levan Hurst on 07/24/2020 15:23:35

## 2020-12-08 ENCOUNTER — Other Ambulatory Visit: Payer: Self-pay

## 2020-12-08 ENCOUNTER — Encounter (HOSPITAL_COMMUNITY): Payer: Self-pay | Admitting: Emergency Medicine

## 2020-12-08 ENCOUNTER — Emergency Department (HOSPITAL_COMMUNITY): Payer: No Typology Code available for payment source

## 2020-12-08 ENCOUNTER — Emergency Department (HOSPITAL_COMMUNITY)
Admission: EM | Admit: 2020-12-08 | Discharge: 2020-12-08 | Disposition: A | Discharge status: Home / Self Care | Payer: No Typology Code available for payment source | Attending: Emergency Medicine | Admitting: Emergency Medicine

## 2020-12-08 DIAGNOSIS — Z96643 Presence of artificial hip joint, bilateral: Secondary | ICD-10-CM | POA: Insufficient documentation

## 2020-12-08 DIAGNOSIS — Z7982 Long term (current) use of aspirin: Secondary | ICD-10-CM | POA: Insufficient documentation

## 2020-12-08 DIAGNOSIS — F17210 Nicotine dependence, cigarettes, uncomplicated: Secondary | ICD-10-CM | POA: Diagnosis not present

## 2020-12-08 DIAGNOSIS — E860 Dehydration: Secondary | ICD-10-CM | POA: Insufficient documentation

## 2020-12-08 DIAGNOSIS — K8020 Calculus of gallbladder without cholecystitis without obstruction: Secondary | ICD-10-CM | POA: Insufficient documentation

## 2020-12-08 DIAGNOSIS — Z8546 Personal history of malignant neoplasm of prostate: Secondary | ICD-10-CM | POA: Insufficient documentation

## 2020-12-08 DIAGNOSIS — M791 Myalgia, unspecified site: Secondary | ICD-10-CM | POA: Diagnosis present

## 2020-12-08 DIAGNOSIS — R531 Weakness: Secondary | ICD-10-CM

## 2020-12-08 DIAGNOSIS — J441 Chronic obstructive pulmonary disease with (acute) exacerbation: Secondary | ICD-10-CM | POA: Diagnosis not present

## 2020-12-08 DIAGNOSIS — U071 COVID-19: Secondary | ICD-10-CM | POA: Insufficient documentation

## 2020-12-08 DIAGNOSIS — M2550 Pain in unspecified joint: Secondary | ICD-10-CM | POA: Insufficient documentation

## 2020-12-08 LAB — URINALYSIS, ROUTINE W REFLEX MICROSCOPIC
Bilirubin Urine: NEGATIVE
Glucose, UA: NEGATIVE mg/dL
Hgb urine dipstick: NEGATIVE
Ketones, ur: 20 mg/dL — AB
Leukocytes,Ua: NEGATIVE
Nitrite: NEGATIVE
Protein, ur: NEGATIVE mg/dL
Specific Gravity, Urine: 1.045 — ABNORMAL HIGH (ref 1.005–1.030)
pH: 6 (ref 5.0–8.0)

## 2020-12-08 LAB — CBC WITH DIFFERENTIAL/PLATELET
Abs Immature Granulocytes: 0.01 10*3/uL (ref 0.00–0.07)
Basophils Absolute: 0 10*3/uL (ref 0.0–0.1)
Basophils Relative: 0 %
Eosinophils Absolute: 0 10*3/uL (ref 0.0–0.5)
Eosinophils Relative: 0 %
HCT: 47.3 % (ref 39.0–52.0)
Hemoglobin: 16 g/dL (ref 13.0–17.0)
Immature Granulocytes: 0 %
Lymphocytes Relative: 34 %
Lymphs Abs: 1.5 10*3/uL (ref 0.7–4.0)
MCH: 29.7 pg (ref 26.0–34.0)
MCHC: 33.8 g/dL (ref 30.0–36.0)
MCV: 87.9 fL (ref 80.0–100.0)
Monocytes Absolute: 0.4 10*3/uL (ref 0.1–1.0)
Monocytes Relative: 8 %
Neutro Abs: 2.5 10*3/uL (ref 1.7–7.7)
Neutrophils Relative %: 58 %
Platelets: 196 10*3/uL (ref 150–400)
RBC: 5.38 MIL/uL (ref 4.22–5.81)
RDW: 12.7 % (ref 11.5–15.5)
WBC: 4.4 10*3/uL (ref 4.0–10.5)
nRBC: 0 % (ref 0.0–0.2)

## 2020-12-08 LAB — COMPREHENSIVE METABOLIC PANEL
ALT: 33 U/L (ref 0–44)
AST: 44 U/L — ABNORMAL HIGH (ref 15–41)
Albumin: 3.6 g/dL (ref 3.5–5.0)
Alkaline Phosphatase: 85 U/L (ref 38–126)
Anion gap: 12 (ref 5–15)
BUN: 10 mg/dL (ref 8–23)
CO2: 22 mmol/L (ref 22–32)
Calcium: 8.9 mg/dL (ref 8.9–10.3)
Chloride: 99 mmol/L (ref 98–111)
Creatinine, Ser: 0.93 mg/dL (ref 0.61–1.24)
GFR, Estimated: >60 mL/min (ref >60)
Glucose, Bld: 155 mg/dL — ABNORMAL HIGH (ref 70–99)
Potassium: 4.2 mmol/L (ref 3.5–5.1)
Sodium: 133 mmol/L — ABNORMAL LOW (ref 135–145)
Total Bilirubin: 1 mg/dL (ref 0.3–1.2)
Total Protein: 7.4 g/dL (ref 6.5–8.1)

## 2020-12-08 LAB — SARS CORONAVIRUS 2 (TAT 6-24 HRS): SARS Coronavirus 2: POSITIVE — AB

## 2020-12-08 LAB — LIPASE, BLOOD: Lipase: 23 U/L (ref 11–51)

## 2020-12-08 MED ORDER — ONDANSETRON HCL 4 MG PO TABS: 4.0000 mg | ORAL_TABLET | Freq: Three times a day (TID) | ORAL | 0 refills | Status: DC | PRN

## 2020-12-08 MED ORDER — ONDANSETRON HCL 4 MG/2ML IJ SOLN
4.0000 mg | Freq: Once | INTRAMUSCULAR | Status: AC
  Administered 2020-12-08: 4 mg via INTRAVENOUS
  Filled 2020-12-08: qty 2

## 2020-12-08 MED ORDER — IOHEXOL 300 MG/ML  SOLN
100.0000 mL | Freq: Once | INTRAMUSCULAR | Status: AC | PRN
  Administered 2020-12-08: 100 mL via INTRAVENOUS

## 2020-12-08 MED ORDER — LACTATED RINGERS IV BOLUS
1000.0000 mL | Freq: Once | INTRAVENOUS | Status: AC
  Administered 2020-12-08: 1000 mL via INTRAVENOUS

## 2020-12-08 NOTE — Discharge Instructions (Addendum)
You were seen in our ED for your weakness, lightheadedness, and abdominal pain. Our imaging and testing was reassuring, although it did show evidence of gallstones, which could be the cause of your symptoms. We do not see any evidence of gallbladder inflammation at this time. You should follow up with general surgery for further evaluation. Please come back to the ED if you have any worsening symptoms, including vomiting that will not stop, significant right upper abdominal pain, fevers/chills.

## 2020-12-08 NOTE — ED Provider Notes (Signed)
Emergency Medicine Provider Triage Evaluation Note  Steve Riley , a 70 y.o. male  was evaluated in triage.  Pt complains of abdominal pain, nausea, diarrhea and generalized weakness.  Reports that last 2 weeks.  Symptoms started after taking doxepine.  Patient took this medication for 3 days and then followed up with [facility] but ultimately stopped taking this medication due to his symptoms.  Patient is having pain to left upper quadrant.  Patient denies any vomiting.  He has been dry heaving.  Denies any blood in stool or melena.  Patient states that his stools have been yellow to light brown in color.  Review of Systems  Positive: Abdominal pain, nausea, diarrhea, generalized weakness Negative: Fever, blood in stool, melena, dysuria, hematuria, urinary frequency, swelling or tenderness  Physical Exam  BP 123/73   Pulse 91   Temp 98.3 F (36.8 C) (Oral)   Resp 20   Ht 5\' 8"  (1.727 m)   Wt 89.8 kg   SpO2 93%   BMI 30.11 kg/m  Gen:   Awake, no distress   Resp:  Normal effort  MSK:   Moves extremities without difficulty  Other:  Abdomen soft, nondistended, tenderness to left upper quadrant and right lower quadrant.  No guarding or rebound tenderness  Medical Decision Making  Medically screening exam initiated at 11:15 AM.  Appropriate orders placed.  SHANKAR SILBER was informed that the remainder of the evaluation will be completed by another provider, this initial triage assessment does not replace that evaluation, and the importance of remaining in the ED until their evaluation is complete.  The patient appears stable so that the remainder of the work up may be completed by another provider.      Loni Beckwith, PA-C 12/08/20 1117    Lorelle Gibbs, DO 12/11/20 1856

## 2020-12-08 NOTE — ED Triage Notes (Signed)
Pt having multiple c/o with increase abd pain, nausea, diarrhea and increase weakness for the past 2 weeks.

## 2020-12-08 NOTE — ED Notes (Signed)
E-signature pad unavailable at time of pt discharge. This RN discussed discharge materials with pt and answered all pt questions. Pt stated understanding of discharge material. ? ?

## 2020-12-08 NOTE — ED Provider Notes (Signed)
[facility] EMERGENCY DEPARTMENT Provider Note   CSN: [id number] Arrival date & time: 12/08/20  1101     History Chief Complaint  Patient presents with   Weakness    Steve Riley is a 70 y.o. male.   Weakness Associated symptoms: abdominal pain, arthralgias, diarrhea, myalgias and nausea   Associated symptoms: no chest pain, no drooling, no dysuria, no fever, no seizures, no shortness of breath and no vomiting   69yoM pmhx COPD, prostate cancer, prurigo nodularis, "slow bowels", presenting for vague abdominal pain, nausea, diarrhea, generalized weakness, orthostatic lightheadedness after discontinuing doxepin 5d ago. He was started on the doxepin 1wk ago for abdominal discomfort/bloating, but felt that it made him fatigued and weak, prompting him to discontinue it. Abdominal pain is vague in location, achy, waxing/waning, no clear improving/worsening factors. Patient and accompanying friends concerned because he lives by himself at [place], and normally has no issues whatsoever w/ ADLs. No further medical concern at this time, including fevers, vomiting, sore throat, rhinorrhea, CP, SOB, pedal edema, seizure, syncope.    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   Malignant neoplasm of prostate ([place]) 03/12/2017    Past Surgical History:  Procedure Laterality Date   BACK SURGERY     HAND SURGERY     PROSTATE BIOPSY     TOTAL HIP ARTHROPLASTY Right 04/06/2019   Procedure: RIGHT TOTAL HIP ARTHROPLASTY ANTERIOR APPROACH;  Surgeon: Mcarthur Rossetti, MD;  Location: [facility];  Service:  Orthopedics;  Laterality: Right;   TOTAL HIP ARTHROPLASTY Left 08/24/2019   TOTAL HIP ARTHROPLASTY Left 08/24/2019   Procedure: LEFT TOTAL HIP ARTHROPLASTY ANTERIOR APPROACH;  Surgeon: Mcarthur Rossetti, MD;  Location: [place];  Service: Orthopedics;  Laterality: Left;       Family History  Problem Relation Age of Onset   Cancer Neg Hx     Social History   Tobacco Use   Smoking status: Some Days    Packs/day: 3.00    Years: 54.00    Pack years: 162.00    Types: Cigarettes    Last attempt to quit: 07/11/2016    Years since quitting: 4.4   Smokeless tobacco: Never   Tobacco comments:    occassionally 1-3 per week  Vaping Use   Vaping Use: Never used  Substance Use Topics   Alcohol use: No   Drug use: No    Home Medications Prior to Admission medications   Medication Sig Start Date End Date Taking? Authorizing Provider  ondansetron (ZOFRAN) 4 MG tablet Take 1 tablet (4 mg total) by mouth every 8 (eight) hours as needed for up to 20 doses for nausea or vomiting. 12/08/20  Yes Avani Sensabaugh, MD  albuterol (PROVENTIL HFA;VENTOLIN HFA) 108 (90 Base) MCG/ACT inhaler Inhale 1-2 puffs into the lungs every 6 (six) hours as needed for wheezing.     [provider]  APPLE CIDER VINEGAR PO Take by mouth in the morning and at bedtime.     [provider]  Ascorbic Acid (VITAMIN C WITH ROSE HIPS) 1000 MG tablet Take 1,000 mg  by mouth daily.    [provider]  aspirin 81 MG chewable tablet Chew 1 tablet (81 mg total) by mouth 2 (two) times daily. 08/25/19   Mcarthur Rossetti, MD  budesonide-formoterol [facility]) 160-4.5 MCG/ACT inhaler Inhale 2 puffs into the lungs 2 (two) times daily.    [provider]  Calcium Carbonate-Vitamin D (CALCIUM 600/VITAMIN D) 600-400 MG-UNIT chew tablet Chew 1 tablet by mouth 2 (two) times daily.     [provider]  celecoxib (CELEBREX) 100 MG capsule Take 100 mg by mouth daily.    [provider]  Cholecalciferol (DIALYVITE VITAMIN D 5000) 125 MCG (5000 UT) capsule Take 5,000 Units by mouth daily.    [provider]  Cyanocobalamin (B-12) 5000 MCG CAPS Take 5,000 mcg by mouth daily.    [provider]  Flaxseed, Linseed, (FLAX PO) Take 2,600 mg by mouth daily.    [provider]  methocarbamol (ROBAXIN) 500 MG tablet Take 1 tablet (500 mg total) by mouth every 6 (six) hours as needed for muscle spasms. 08/25/19   Mcarthur Rossetti, MD  Methylsulfonylmethane (MSM) 1000 MG CAPS Take 1,000 mg by mouth 2 (two) times daily.    [provider]  OVER THE COUNTER MEDICATION Take 4,000 Units by mouth in the morning and at bedtime. Serrapeptase     [provider]  oxyCODONE (OXY IR/ROXICODONE) 5 MG immediate release tablet Take 1-2 tablets (5-10 mg total) by mouth every 4 (four) hours as needed for moderate pain (pain score 4-6). 09/01/19   Mcarthur Rossetti, MD  Probiotic CAPS Take 1 capsule by mouth 2 (two) times daily with a meal.    [provider]  selenium (SELENIMIN-200) 200 MCG TABS tablet Take 200 mcg by mouth daily.    [provider]  silver sulfADIAZINE (SILVADENE) 1 % cream Apply 1 application topically in the morning, at noon, and at bedtime.    [provider]  Tiotropium Bromide Monohydrate (SPIRIVA RESPIMAT) 2.5 MCG/ACT AERS Inhale 1 puff into the lungs in the morning and at bedtime.    [provider]  traZODone (DESYREL) 100 MG tablet Take 100 mg by mouth at bedtime.    [provider]  Turmeric Curcumin 500 MG CAPS Take 500 mg by mouth in the morning and at bedtime.     [provider]  Zinc 50 MG TABS Take 50 mg by mouth daily.    [provider]    Allergies    Penicillin g  Review of Systems   Review of Systems  Constitutional:  Positive for activity change and fatigue. Negative for chills, diaphoresis and fever.  HENT:  Negative for  drooling, rhinorrhea and sore throat.   Eyes:  Negative for photophobia and pain.  Respiratory:  Negative for shortness of breath and stridor.   Cardiovascular:  Negative for chest pain and palpitations.  Gastrointestinal:  Positive for abdominal pain, diarrhea and nausea. Negative for abdominal distention and vomiting.  Genitourinary:  Negative for dysuria and hematuria.  Musculoskeletal:  Positive for arthralgias and myalgias. Negative for gait problem and neck stiffness.  Skin:  Negative for rash and wound.  Neurological:  Positive for weakness and light-headedness. Negative for seizures, syncope and speech difficulty.  Psychiatric/Behavioral:  Negative for agitation and confusion.    Physical Exam Updated Vital Signs BP (!) 130/50 (BP Location: Right Arm)   Pulse 73   Temp 98.1 F (36.7 C) (Oral)   Resp 19   Ht  5\' 8"  (1.727 m)   Wt 89.8 kg   SpO2 95%   BMI 30.11 kg/m   Physical Exam Vitals and nursing note reviewed.  Constitutional:      General: He is not in acute distress.    Appearance: He is not toxic-appearing.  HENT:     Head: Normocephalic and atraumatic.     Right Ear: External ear normal.     Left Ear: External ear normal.     Nose: Nose normal.     Mouth/Throat:     Mouth: Mucous membranes are moist.     Pharynx: Oropharynx is clear.  Eyes:     Extraocular Movements: Extraocular movements intact.     Conjunctiva/sclera: Conjunctivae normal.     Pupils: Pupils are equal, round, and reactive to light.  Cardiovascular:     Rate and Rhythm: Normal rate and regular rhythm.     Heart sounds: No murmur heard.   No friction rub. No gallop.  Pulmonary:     Breath sounds: No stridor. Wheezing (scattered) present. No rhonchi or rales.  Abdominal:     General: There is no distension.     Palpations: Abdomen is soft.     Tenderness: There is abdominal tenderness (varying over serial exams, + murphys sign). There is guarding. There is no right CVA tenderness, left  CVA tenderness or rebound.  Musculoskeletal:        General: No deformity or signs of injury.     Cervical back: Normal range of motion. No rigidity or tenderness.  Skin:    General: Skin is warm and dry.  Neurological:     General: No focal deficit present.     Mental Status: He is alert and oriented to person, place, and time. Mental status is at baseline.  Psychiatric:        Mood and Affect: Mood normal.        Behavior: Behavior normal.    ED Results / Procedures / Treatments   Labs (all labs ordered are listed, but only abnormal results are displayed) Labs Reviewed  SARS CORONAVIRUS 2 (TAT 6-24 HRS) - Abnormal; Notable for the following components:      Result Value   SARS Coronavirus 2 POSITIVE (*)    All other components within normal limits  COMPREHENSIVE METABOLIC PANEL - Abnormal; Notable for the following components:   Sodium 133 (*)    Glucose, Bld 155 (*)    AST 44 (*)    All other components within normal limits  URINALYSIS, ROUTINE W REFLEX MICROSCOPIC - Abnormal; Notable for the following components:   Specific Gravity, Urine 1.045 (*)    Ketones, ur 20 (*)    All other components within normal limits  CBC WITH DIFFERENTIAL/PLATELET  LIPASE, BLOOD    EKG EKG Interpretation  Date/Time:  Friday December 08 2020 11:06:24 EDT Ventricular Rate:  91 PR Interval:  154 QRS Duration: 88 QT Interval:  360 QTC Calculation: 442 R Axis:   -20 Text Interpretation: Normal sinus rhythm Normal ECG similar to previous Confirmed by Theotis Burrow [phone]) on 12/08/2020 3:45:37 PM  Radiology CT ABDOMEN PELVIS W CONTRAST  Result Date: 12/08/2020 CLINICAL DATA:  70 year old male with RIGHT UPPER abdominal and LEFT LOWER abdominal pain. EXAM: CT ABDOMEN AND PELVIS WITH CONTRAST TECHNIQUE: Multidetector CT imaging of the abdomen and pelvis was performed using the standard protocol following bolus administration of intravenous contrast. CONTRAST:  178mL OMNIPAQUE IOHEXOL 300 MG/ML   SOLN COMPARISON:  None. FINDINGS: Lower chest:  Emphysema at the lung bases noted. Hepatobiliary: The liver is unremarkable. Cholelithiasis noted without CT evidence of acute cholecystitis. No biliary dilatation. Pancreas: Unremarkable Spleen: Unremarkable Adrenals/Urinary Tract: The kidneys are unremarkable except for bilateral renal cysts. The adrenal glands and visualized bladder are unremarkable. Stomach/Bowel: Stomach is within normal limits. Appendix appears normal. No evidence of bowel wall thickening, distention, or inflammatory changes. Colonic diverticulosis noted without evidence of diverticulitis. Vascular/Lymphatic: Aortic atherosclerosis without aortic aneurysm. A moderate amount of atherosclerotic plaque versus focal intramural hematoma within the mid/distal abdominal aorta noted (series 3: Image 39). No enlarged abdominal or pelvic lymph nodes. Reproductive: Not well assessed due to hip hardware artifact. Other: No ascites, focal collection or pneumoperitoneum. Musculoskeletal: No acute or suspicious bony abnormalities. Moderate degenerative disc disease at L4-5 and L5-S1 noted. IMPRESSION: 1. Moderate atherosclerotic plaque versus focal intramural hematoma within the mid/distal abdominal aorta without aneurysm. 2. Cholelithiasis without CT evidence of acute cholecystitis. No biliary dilatation. Consider ultrasound evaluation if there is strong clinical suspicion for acute cholecystitis. 3. Aortic Atherosclerosis (ICD10-I70.0) and Emphysema (ICD10-J43.9). Electronically Signed   By: Margarette Canada M.D.   On: 12/08/2020 13:40   US Abdomen Limited  Result Date: 12/08/2020 CLINICAL DATA:  Right upper quadrant pain EXAM: ULTRASOUND ABDOMEN LIMITED RIGHT UPPER QUADRANT COMPARISON:  CT 8 chest 12/08/2020 FINDINGS: Gallbladder: Multiple shadowing stones. Normal wall thickness. Negative sonographic Murphy. Common bile duct: Diameter: 2.9 mm Liver: No focal lesion identified. Within normal limits in  parenchymal echogenicity. Portal vein is patent on color Doppler imaging with normal direction of blood flow towards the liver. Other: None. IMPRESSION: Cholelithiasis without sonographic evidence for acute cholecystitis. Electronically Signed   By: Donavan Foil M.D.   On: 12/08/2020 17:58    Procedures Procedures   Medications Ordered in ED Medications  iohexol (OMNIPAQUE) 300 MG/ML solution 100 mL (100 mLs Intravenous Contrast Given 12/08/20 1310)  lactated ringers bolus 1,000 mL (0 mLs Intravenous Stopped 12/08/20 1900)  ondansetron (ZOFRAN) injection 4 mg (4 mg Intravenous Given 12/08/20 1555)    ED Course  I have reviewed the triage vital signs and the nursing notes.  Pertinent labs & imaging results that were available during my care of the patient were reviewed by me and considered in my medical decision making (see chart for details).    MDM Rules/Calculators/A&P                          This is a 62yoM w/ hx and pe as above.  Initial interventions: LR bolus x1L, zofran administered w/ improvement of orthostatic lightheadedness and nausea.  Ddx included: dehydration, medication side-effect, viral illness, sepsis, pneumonia, appendicitis, volvulus, obstruction, pancreatitis, biliary disease, metabolic derangement, UTI, anemia, arrhythmia  All studies independently reviewed by myself, d/w the attending physician, factored into my mdm. -EKG: nsr 91bpm, L axis deviation, normal intervals, no acute ischemic changes; essentially unchanged compared to prior from 2019 -CT AP w/ contrast: cholelithiasis w/o cholecystitis. Per radiology read, questionable aortic plaque v intramural hematoma -US gallbladder: no evidence for acute cholecystitis -Unremarkable: UA, CMP, CBCd, lipase -COVID PCR pending (noted to be positive after discharge)  At time of discharge, uncertain of etiology of patient's presentation, possibly due to cholelithiasis. EKG reassuring against arrhythmia or TCA side  effects. Patient is afebrile, nontoxic, hds. Nonfocal abdominal exam findings, does not appear to be an acute abdomen, reassuring imaging. Continuing to pass flatus. Reassuring labs.  Therefore, feel pt stable for discharge home w/ close outpatient  follow-up. Patient was encouraged to f/u w/ general surgery on outpatient basis, and contact info was provided. Will send home w/ zofran. Return precautions discussed. Pt understands and agrees w/ plan.  Pt HDS on reevaluation, maintaining sats w/ ambulation, nontoxic appearing. Subsequently discharged.  Final Clinical Impression(s) / ED Diagnoses Final diagnoses:  Weakness  Dehydration  Calculus of gallbladder without cholecystitis without obstruction    Rx / DC Orders ED Discharge Orders          Ordered    ondansetron (ZOFRAN) 4 MG tablet  Every 8 hours PRN        12/08/20 1940           .Barrett Shell, MD 12/09/20 0222    Sharlett Iles, MD 12/13/20 [phone]

## 2021-01-12 DIAGNOSIS — I700 Atherosclerosis of aorta: Secondary | ICD-10-CM | POA: Diagnosis not present

## 2021-01-12 DIAGNOSIS — Q790 Congenital diaphragmatic hernia: Secondary | ICD-10-CM | POA: Diagnosis not present

## 2021-01-12 DIAGNOSIS — K8010 Calculus of gallbladder with chronic cholecystitis without obstruction: Secondary | ICD-10-CM | POA: Diagnosis not present

## 2021-01-12 DIAGNOSIS — B342 Coronavirus infection, unspecified: Secondary | ICD-10-CM | POA: Diagnosis not present

## 2021-04-24 ENCOUNTER — Encounter: Payer: Self-pay | Admitting: Internal Medicine

## 2021-04-24 ENCOUNTER — Other Ambulatory Visit: Payer: Self-pay

## 2021-04-24 ENCOUNTER — Ambulatory Visit (INDEPENDENT_AMBULATORY_CARE_PROVIDER_SITE_OTHER): Payer: No Typology Code available for payment source | Admitting: Internal Medicine

## 2021-04-24 VITALS — BP 142/90 | HR 83 | Temp 97.8°F | Ht 68.0 in | Wt 194.2 lb

## 2021-04-24 DIAGNOSIS — F17210 Nicotine dependence, cigarettes, uncomplicated: Secondary | ICD-10-CM

## 2021-04-24 DIAGNOSIS — F17200 Nicotine dependence, unspecified, uncomplicated: Secondary | ICD-10-CM

## 2021-04-24 DIAGNOSIS — J449 Chronic obstructive pulmonary disease, unspecified: Secondary | ICD-10-CM

## 2021-04-24 MED ORDER — BUPROPION HCL ER (SR) 150 MG PO TB12: 150.0000 mg | ORAL_TABLET | Freq: Two times a day (BID) | ORAL | 5 refills | Status: DC

## 2021-04-24 NOTE — Progress Notes (Signed)
Steve Riley    [id number]    August 04, 1950  Primary Care Physician:Pcp, No  Referring Physician: Windy Fast, MD [address] [place],  [place] [address] Reason for Consultation: shortness of breath Date of Consultation: 04/24/2021  Chief complaint:   Chief Complaint  Patient presents with   Consult    Patient has had trouble with his breathing     HPI: Steve OROURKE is a 70 y.o. man who presents as new patient evaluation for dyspnea from the [place].   Has been on symbicort and spiriva for the past 2-3 years.   He has a chronic tickle in his throat and is constantly sucking on cough drops and mints.   He was prescribed an albuterol inhaler and has a nebulizer machine. Takes these less than once/week.   Has had pneumonia once many years ago, was not hospitalized for it. Does not have recurrent flares of his COPD. He has had PFTs once at the [place], but this was a few years ago.   He gets sob with minimal exertion such as walking in from waiting room, resolves with rest.   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  Exposures: lives alone and denies dyspnea with ADLs.  Smoking history: started smoking age 66, has quit on and off and resumed during stressors in life. 3 ppd x 50 years. 150 pack years. Currently down to 1/2 ppd.    Social History   Occupational History   Not on file  Tobacco Use   Smoking status: Some Days    Packs/day: 3.00    Years: 54.00    Pack years: 162.00    Types: Cigarettes    Last attempt to quit: 07/11/2016    Years since quitting: 4.7   Smokeless tobacco: Never   Tobacco comments:    occassionally 1-3 per week  Vaping Use   Vaping Use: Never used  Substance and Sexual Activity   Alcohol use: No   Drug  use: No   Sexual activity: Not Currently    Relevant family history:  Family History  Problem Relation Age of Onset   COPD Mother    COPD Brother    Cancer Neg Hx     Past Medical History:  Diagnosis Date   COPD (chronic obstructive pulmonary disease) ([place])    Pneumonia    Prostate cancer ([place])     Past Surgical History:  Procedure Laterality Date   BACK SURGERY     HAND SURGERY     PROSTATE BIOPSY     TOTAL HIP ARTHROPLASTY Right 04/06/2019   Procedure: RIGHT TOTAL HIP ARTHROPLASTY ANTERIOR APPROACH;  Surgeon: Mcarthur Rossetti, MD;  Location: [facility];  Service: Orthopedics;  Laterality: Right;   TOTAL HIP ARTHROPLASTY Left 08/24/2019   TOTAL HIP ARTHROPLASTY Left 08/24/2019   Procedure: LEFT TOTAL HIP ARTHROPLASTY ANTERIOR APPROACH;  Surgeon: Mcarthur Rossetti, MD;  Location: [facility] [place];  Service: Orthopedics;  Laterality: Left;     Physical Exam: Blood pressure (!) 142/90, pulse 83, temperature 97.8 F (36.6 C), temperature source Oral, height 5\' 8"  (1.727 m), weight 194 lb 3.2 oz (88.1 kg), SpO2 95 %. Gen:      No acute distress  ENT:  no nasal polyps, mucus membranes moist Lungs:    Diminished bilaterally, No increased respiratory effort, symmetric chest wall excursion, clear to auscultation bilaterally, no wheezes or crackles CV:         Regular rate and rhythm; no murmurs, rubs, or gallops.  No pedal edema Abd:      + bowel sounds; soft, non-tender; no distension MSK: no acute synovitis of DIP or PIP joints, no mechanics hands.  Skin:      Warm and dry; no rashes Neuro: normal speech, no focal facial asymmetry Psych: alert and oriented x3, normal mood and affect   Data Reviewed/Medical Decision Making:  Independent interpretation of tests: Imaging:  Review of patient's CT A/P lung windows form July 2022 images revealed emphysema. The patient's images have been independently reviewed by me.    PFTs: None on file No flowsheet data found.  Labs: AEC 350  in June 2022 BMP shows normal Cr, Glucose 147 (elevated) Quantiferon negative   Immunization status:  Immunization History  Administered Date(s) Administered   Pneumococcal Polysaccharide-23 03/07/2018     I reviewed prior external note(s) from ED  I reviewed the result(s) of the labs and imaging as noted above.   I have ordered PFTs   Assessment:  COPD Tobacco Use Disorder, with smoking cessation counseling Peripheral Eosinophilia   Plan/Recommendations: Will obtain PFTs to understand disease severity.  Continue symbicort and spiriva.  Would like to try wellbutrin for smoking cessation will send to [facility]. He will probably need LDCT for lung cancer screening and may benefit from pulmonary rehab Will have to look into how to get those for him at the [place].   Smoking Cessation Counseling:  1. The patient is an everyday smoker and symptomatic due to the following condition COPD 2. The patient is currently contemplative in quitting smoking. 3. I advised patient to quit smoking. 4. We identified patient specific barriers to change.  5. I personally spent 5 minutes counseling the patient regarding tobacco use disorder. 6. We discussed management of stress and anxiety to help with smoking cessation, when applicable. 7. We discussed nicotine replacement therapy, Wellbutrin, Chantix as possible options. 8. I advised setting a quit date. 9. Follow?up arranged with our office to continue ongoing discussions. 10.Resources given to patient including quit hotline.   We discussed disease management and progression at length today for COPD.   Return to Care: Return in about 2 months (around 06/24/2021).  Lenice Llamas, MD Pulmonary and [place]  CC: Windy Fast, MD

## 2021-04-24 NOTE — Patient Instructions (Addendum)
Please schedule follow up scheduled with myself in 2 months.  If my schedule is not open yet, we will contact you with a reminder closer to that time.  Before your next visit I would like you to have: Full set of PFTs - 1 hour  Keep taking spiriva and symbicort. I have sent the medication wellbutrin to help you quit smoking to the [facility].   Bupropion -- Bupropion (brand names: Zyban, Wellbutrin) is an antidepressant that can be used to help you stop smoking.  Start taking it once per day for three days, then increase to twice daily starting four weeks before the quit date.  You should typically continue for 7 to 12 weeks after you quit smoking.  Please do not stop taking this medication abruptly.  When you are ready to stop we will have you go to once daily for two weeks and then you can do once every other day for a week before you stop.   Bupropion is generally well-tolerated, but it may cause dry mouth and difficulty sleeping. The drug should not be used by people who have a seizure disorder or bipolar (manic-depressive) disorder, and it is not recommended for those who have head trauma, anorexia nervosa, or bulimia, or for those who drink alcohol excessively.  What are the benefits of quitting smoking? Quitting smoking can lower your chances of getting or dying from heart disease, lung disease, kidney failure, infection, or cancer. It can also lower your chances of getting osteoporosis, a condition that makes your bones weak. Plus, quitting smoking can help your skin look younger and reduce the chances that you will have problems with sex.  Quitting smoking will improve your health no matter how old you are, and no matter how long or how much you have smoked.  What should I do if I want to quit smoking? The letters in the word "START" can help you remember the steps to take: S = Set a quit date. T = Tell family, friends, and the people around you that you plan to quit. A = Anticipate  or plan ahead for the tough times you'll face while quitting. R = Remove cigarettes and other tobacco products from your home, car, and work. T = Talk to your doctor about getting help to quit.  How can my doctor or nurse help? Your doctor or nurse can give you advice on the best way to quit. He or she can also put you in touch with counselors or other people you can call for support. Plus, your doctor or nurse can give you medicines to: ?Reduce your craving for cigarettes ?Reduce the unpleasant symptoms that happen when you stop smoking (called "withdrawal symptoms"). You can also get help from a free phone line (1-800-QUIT-NOW) or go online to [employer].  What are the symptoms of withdrawal? The symptoms include: ?Trouble sleeping ?Being irritable, anxious or restless ?Getting frustrated or angry ?Having trouble thinking clearly  Some people who stop smoking become temporarily depressed. Some people need treatment for depression, such as counseling or antidepressant medicines. Depressed people might: ?No longer enjoy or care about doing the things they used to like to do ?Feel sad, down, hopeless, nervous, or cranky most of the day, almost every day ?Lose or gain weight ?Sleep too much or too little ?Feel tired or like they have no energy ?Feel guilty or like they are worth nothing ?Forget things or feel confused ?Move and speak more slowly than usual ?Act restless or have  trouble staying still ?Think about death or suicide  If you think you might be depressed, see your doctor or nurse. Only someone trained in mental health can tell for sure if you are depressed. If you ever feel like you might hurt yourself, go straight to the nearest emergency department. Or you can call for an ambulance (in the [place] and [place], [place] 9-1-1) or call your doctor or nurse right away and tell them it is an emergency. You can also reach the [place] National Suicide Prevention Lifeline at 820-448-2601  or http://walker-sanchez.info/.  How do medicines help you stop smoking? Different medicines work in different ways: ?Nicotine replacement therapy eases withdrawal and reduces your body's craving for nicotine, the main drug found in cigarettes. There are different forms of nicotine replacement, including skin patches, lozenges, gum, nasal sprays, and "puffers" or inhalers. Many can be bought without a prescription, while others might require one. ?Bupropion is a prescription medicine that reduces your desire to smoke. This medicine is sold under the brand names Zyban and Wellbutrin. It is also available in a generic version, which is cheaper than brand name medicines. ?Varenicline (brand names: Chantix, Champix) is a prescription medicine that reduces withdrawal symptoms and cigarette cravings. If you think you'd like to take varenicline and you have a history of depression, anxiety, or heart disease, discuss this with your doctor or nurse before taking the medicine. Varenicline can also increase the effects of alcohol in some people. It's a good idea to limit drinking while you're taking it, at least until you know how it affects you.  How does counseling work? Counseling can happen during formal office visits or just over the phone. A counselor can help you: ?Figure out what triggers your smoking and what to do instead ?Overcome cravings ?Figure out what went wrong when you tried to quit before  What works best? Studies show that people have the best luck at quitting if they take medicines to help them quit and work with a Social worker. It might also be helpful to combine nicotine replacement with one of the prescription medicines that help people quit. In some cases, it might even make sense to take bupropion and varenicline together.  What about e-cigarettes? Sometimes people wonder if using electronic cigarettes, or "e-cigarettes," might help them quit smoking. Using e-cigarettes is also  called "vaping." Doctors do not recommend e-cigarettes in place of medicines and counseling. That's because e-cigarettes still contain nicotine as well as other substances that might be harmful. It's not clear how they can affect a person's health in the long term.  Will I gain weight if I quit? Yes, you might gain a few pounds. But quitting smoking will have a much more positive effect on your health than weighing a few pounds more. Plus, you can help prevent some weight gain by being more active and eating less. Taking the medicine bupropion might help control weight gain.   What else can I do to improve my chances of quitting? You can: ?Start exercising. ?Stay away from smokers and places that you associate with smoking. If people close to you smoke, ask them to quit with you. ?Keep gum, hard candy, or something to put in your mouth handy. If you get a craving for a cigarette, try one of these instead. ?Don't give up, even if you start smoking again. It takes most people a few tries before they succeed.  What if I am pregnant and I smoke? If you are pregnant, it's really important  for the health of your baby that you quit. Ask your doctor what options you have, and what is safest for your baby  Understanding COPD   What is COPD? COPD stands for chronic obstructive pulmonary (lung) disease. COPD is a general term used for several lung diseases.  COPD is an umbrella term and encompasses other  common diseases in this group like chronic bronchitis and emphysema. Chronic asthma may also be included in this group. While some patients with COPD have only chronic bronchitis or emphysema, most patients have a combination of both.  You might hear these terms used in exchange for one another.   COPD adds to the work of the heart. Diseased lungs may reduce the amount of oxygen that goes to the blood. High blood pressure in blood vessels from the heart to the lungs makes it difficult for the heart to  pump. Lung disease can also cause the body to produce too many red blood cells which may make the blood thicker and harder to pump.   Patients who have COPD with low oxygen levels may develop an enlarged heart (cor pulmonale). This condition weakens the heart and causes increased shortness of breath and swelling in the legs and feet.   Chronic bronchitis Chronic bronchitis is irritation and inflammation (swelling) of the lining in the bronchial tubes (air passages). The irritation causes coughing and an excess amount of mucus in the airways. The swelling makes it difficult to get air in and out of the lungs. The small, hair-like structures on the inside of the airways (called cilia) may be damaged by the irritation. The cilia are then unable to help clean mucus from the airways.  Bronchitis is generally considered to be chronic when you have: a productive cough (cough up mucus) and shortness of breath that lasts about 3 months or more each year for 2 or more years in a row. Your doctor may define chronic bronchitis differently.   Emphysema Emphysema is the destruction, or breakdown, of the walls of the alveoli (air sacs) located at the end of the bronchial tubes. The damaged alveoli are not able to exchange oxygen and carbon dioxide between the lungs and the blood. The bronchioles lose their elasticity and collapse when you exhale, trapping air in the lungs. The trapped air keeps fresh air and oxygen from entering the lungs.   Who is affected by COPD? Emphysema and chronic bronchitis affect approximately 16 million people in the [place], or close to 11 percent of the population.   Symptoms of COPD  Shortness of breath  Shortness of breath with mild exercise (walking, using the stairs, etc.)  Chronic, productive cough (with mucus)  A feeling of "tightness" in the chest  Wheezing   What causes COPD? The two primary causes of COPD are cigarette smoking and alpha1-antitrypsin (AAT)  deficiency. Air pollution and occupational dusts may also contribute to COPD, especially when the person exposed to these substances is a cigarette smoker.  Cigarette smoke causes COPD by irritating the airways and creating inflammation that narrows the airways, making it more difficult to breathe. Cigarette smoke also causes the cilia to stop working properly so mucus and trapped particles are not cleaned from the airways. As a result, chronic cough and excess mucus production develop, leading to chronic bronchitis.  In some people, chronic bronchitis and infections can lead to destruction of the small airways, or emphysema.  AAT deficiency, an inherited disorder, can also lead to emphysema. Alpha antitrypsin (AAT) is a  protective material produced in the liver and transported to the lungs to help combat inflammation. When there is not enough of the chemical AAT, the body is no longer protected from an enzyme in the white blood cells.   How is COPD diagnosed?  To diagnose COPD, the physician needs to know: Do you smoke?  Have you had chronic exposure to dust or air pollutants?  Do other members of your family have lung disease?  Are you short of breath?  Do you get short of breath with exercise?  Do you have chronic cough and/or wheezing?  Do you cough up excess mucus?  To help with the diagnosis, the physician will conduct a thorough physical exam which includes:  Listening to your lungs and heart  Checking your blood pressure and pulse  Examining your nose and throat  Checking your feet and ankles for swelling   Laboratory and other tests Several laboratory and other tests are needed to confirm a diagnosis of COPD. These tests may include:  Chest X-ray to look for lung changes that could be caused by COPD   Spirometry and pulmonary function tests (PFTs) to determine lung volume and air flow  Pulse oximetry to measure the saturation of oxygen in the blood  Arterial blood gases (ABGs) to  determine the amount of oxygen and carbon dioxide in the blood  Exercise testing to determine if the oxygen level in the blood drops during exercise   Treatment In the beginning stages of COPD, there is minimal shortness of breath that may be noticed only during exercise. As the disease progresses, shortness of breath may worsen and you may need to wear an oxygen device.   To help control other symptoms of COPD, the following treatments and lifestyle changes may be prescribed.  Quitting smoking  Avoiding cigarette smoke and other irritants  Taking medications including: a. bronchodilators b. anti-inflammatory agents c. oxygen d. antibiotics  Maintaining a healthy diet  Following a structured exercise program such as pulmonary rehabilitation Preventing respiratory infections  Controlling stress   If your COPD progresses, you may be eligible to be evaluated for lung volume reduction surgery or lung transplantation. You may also be eligible to participate in certain clinical trials (research studies). Ask your health care providers about studies being conducted in your hospital.   What is the outlook? Although COPD can not be cured, its symptoms can be treated and your quality of life can be improved. Your prognosis or outlook for the future will depend on how well your lungs are functioning, your symptoms, and how well you respond to and follow your treatment plan.

## 2021-07-05 ENCOUNTER — Ambulatory Visit: Payer: No Typology Code available for payment source | Admitting: Internal Medicine

## 2021-08-13 ENCOUNTER — Encounter: Payer: Self-pay | Admitting: Internal Medicine

## 2021-08-13 ENCOUNTER — Ambulatory Visit (INDEPENDENT_AMBULATORY_CARE_PROVIDER_SITE_OTHER): Payer: No Typology Code available for payment source | Admitting: Internal Medicine

## 2021-08-13 ENCOUNTER — Other Ambulatory Visit: Payer: Self-pay

## 2021-08-13 VITALS — BP 114/70 | HR 71 | Temp 98.2°F | Ht 68.5 in | Wt 192.2 lb

## 2021-08-13 DIAGNOSIS — J449 Chronic obstructive pulmonary disease, unspecified: Secondary | ICD-10-CM

## 2021-08-13 DIAGNOSIS — F17210 Nicotine dependence, cigarettes, uncomplicated: Secondary | ICD-10-CM | POA: Diagnosis not present

## 2021-08-13 DIAGNOSIS — F17200 Nicotine dependence, unspecified, uncomplicated: Secondary | ICD-10-CM

## 2021-08-13 LAB — PULMONARY FUNCTION TEST
DL/VA % pred: 61 %
DL/VA: 2.48 ml/min/mmHg/L
DLCO cor % pred: 48 %
DLCO cor: 11.9 ml/min/mmHg
DLCO unc % pred: 48 %
DLCO unc: 11.9 ml/min/mmHg
FEF 25-75 Post: 0.63 L/sec
FEF 25-75 Pre: 0.46 L/sec
FEF2575-%Change-Post: 38 %
FEF2575-%Pred-Post: 28 %
FEF2575-%Pred-Pre: 20 %
FEV1-%Change-Post: 8 %
FEV1-%Pred-Post: 43 %
FEV1-%Pred-Pre: 39 %
FEV1-Post: 1.29 L
FEV1-Pre: 1.19 L
FEV1FVC-%Change-Post: -5 %
FEV1FVC-%Pred-Pre: 53 %
FEV6-%Change-Post: 13 %
FEV6-%Pred-Post: 84 %
FEV6-%Pred-Pre: 74 %
FEV6-Post: 3.25 L
FEV6-Pre: 2.85 L
FEV6FVC-%Change-Post: -1 %
FEV6FVC-%Pred-Post: 97 %
FEV6FVC-%Pred-Pre: 98 %
FVC-%Change-Post: 14 %
FVC-%Pred-Post: 86 %
FVC-%Pred-Pre: 74 %
FVC-Post: 3.53 L
FVC-Pre: 3.07 L
Post FEV1/FVC ratio: 37 %
Post FEV6/FVC ratio: 92 %
Pre FEV1/FVC ratio: 39 %
Pre FEV6/FVC Ratio: 93 %
RV % pred: 153 %
RV: 3.69 L
TLC % pred: 117 %
TLC: 7.88 L

## 2021-08-13 NOTE — Progress Notes (Signed)
Full PFT completed today ? ?

## 2021-08-13 NOTE — Patient Instructions (Addendum)
Please schedule follow up scheduled with myself in 6 months.  If my schedule is not open yet, we will contact you with a reminder closer to that time. Please call [phone] if you haven't heard from [place] a month before.  ? ?Continue spiriva and symbicort.  ? ?Take the albuterol rescue inhaler or nebulizer therapy every 4 to 6 hours as needed for wheezing or shortness of breath. ?You can also take it 15 minutes before exercise or exertional activity. ?Side effects include heart racing or pounding, jitters or anxiety. If you have a history of an irregular heart rhythm, it can make this worse. Can also give some patients a hard time sleeping. ? ?What are the benefits of quitting smoking? ?Quitting smoking can lower your chances of getting or dying from heart disease, lung disease, kidney failure, infection, or cancer. It can also lower your chances of getting osteoporosis, a condition that makes your bones weak. Plus, quitting smoking can help your skin look younger and reduce the chances that you will have problems with sex. ? ?Quitting smoking will improve your health no matter how old you are, and no matter how long or how much you have smoked. ? ?What should I do if I want to quit smoking? ?The letters in the word "START" can help you remember the steps to take: ?S = Set a quit date. ?T = Tell family, friends, and the people around you that you plan to quit. ?A = Anticipate or plan ahead for the tough times you'll face while quitting. ?R = Remove cigarettes and other tobacco products from your home, car, and work. ?T = Talk to your doctor about getting help to quit. ? ?How can my doctor or nurse help? ?Your doctor or nurse can give you advice on the best way to quit. He or she can also put you in touch with counselors or other people you can call for support. Plus, your doctor or nurse can give you medicines to: ??Reduce your craving for cigarettes ??Reduce the unpleasant symptoms that happen when you stop  smoking (called "withdrawal symptoms"). ?You can also get help from a free phone line (1-800-QUIT-NOW) or go online to [employer]. ? ?What are the symptoms of withdrawal? ?The symptoms include: ??Trouble sleeping ??Being irritable, anxious or restless ??Getting frustrated or angry ??Having trouble thinking clearly ? ?Some people who stop smoking become temporarily depressed. Some people need treatment for depression, such as counseling or antidepressant medicines. Depressed people might: ??No longer enjoy or care about doing the things they used to like to do ??Feel sad, down, hopeless, nervous, or cranky most of the day, almost every day ??Lose or gain weight ??Sleep too much or too little ??Feel tired or like they have no energy ??Feel guilty or like they are worth nothing ??Forget things or feel confused ??Move and speak more slowly than usual ??Act restless or have trouble staying still ??Think about death or suicide ? ?If you think you might be depressed, see your doctor or nurse. Only someone trained in mental health can tell for sure if you are depressed. ?If you ever feel like you might hurt yourself, go straight to the nearest emergency department. Or you can call for an ambulance (in the [place] and [place], [place] 9-1-1) or call your doctor or nurse right away and tell them it is an emergency. You can also reach the [place] National Suicide Prevention Lifeline at 630-242-8217 or http://walker-sanchez.info/. ? ?How do medicines help you stop smoking? ?Different medicines  work in different ways: ??Nicotine replacement therapy eases withdrawal and reduces your body's craving for nicotine, the main drug found in cigarettes. There are different forms of nicotine replacement, including skin patches, lozenges, gum, nasal sprays, and "puffers" or inhalers. Many can be bought without a prescription, while others might require one. ??Bupropion is a prescription medicine that reduces your desire to smoke. This  medicine is sold under the brand names Zyban and Wellbutrin. It is also available in a generic version, which is cheaper than brand name medicines. ??Varenicline (brand names: Chantix, Champix) is a prescription medicine that reduces withdrawal symptoms and cigarette cravings. If you think you'd like to take varenicline and you have a history of depression, anxiety, or heart disease, discuss this with your doctor or nurse before taking the medicine. Varenicline can also increase the effects of alcohol in some people. It's a good idea to limit drinking while you're taking it, at least until you know how it affects you. ? ?How does counseling work? ?Counseling can happen during formal office visits or just over the phone. A counselor can help you: ??Figure out what triggers your smoking and what to do instead ??Overcome cravings ??Figure out what went wrong when you tried to quit before ? ?What works best? ?Studies show that people have the best luck at quitting if they take medicines to help them quit and work with a Social worker. It might also be helpful to combine nicotine replacement with one of the prescription medicines that help people quit. In some cases, it might even make sense to take bupropion and varenicline together. ? ?What about e-cigarettes? ?Sometimes people wonder if using electronic cigarettes, or "e-cigarettes," might help them quit smoking. Using e-cigarettes is also called "vaping." Doctors do not recommend e-cigarettes in place of medicines and counseling. That's because e-cigarettes still contain nicotine as well as other substances that might be harmful. It's not clear how they can affect a person's health in the long term. ? ?Will I gain weight if I quit? ?Yes, you might gain a few pounds. But quitting smoking will have a much more positive effect on your health than weighing a few pounds more. Plus, you can help prevent some weight gain by being more active and eating less. Taking the medicine  bupropion might help control weight gain.  ? ?What else can I do to improve my chances of quitting? ?You can: ??Start exercising. ??Stay away from smokers and places that you associate with smoking. If people close to you smoke, ask them to quit with you. ??Keep gum, hard candy, or something to put in your mouth handy. If you get a craving for a cigarette, try one of these instead. ??Don't give up, even if you start smoking again. It takes most people a few tries before they succeed. ? ?What if I am pregnant and I smoke? ?If you are pregnant, it's really important for the health of your baby that you quit. Ask your doctor what options you have, and what is safest for your baby ? ?

## 2021-08-13 NOTE — Progress Notes (Signed)
? ?      ?Steve Riley    [id number]    [birth year] ? ?Primary Care Physician:Pcp, No ?Date of Appointment: 08/13/2021 ?Established Patient Visit ? ?Chief complaint:   ?Chief Complaint  ?Patient presents with  ? Follow-up  ?  PFT performed today.  Pt states he has been doing okay since last visit.  ? ? ? ?HPI: ?Steve Riley is a 71 y.o. man with COPD ? ?Interval Updates: ?Here for follow up after PFTs which show very severe airflow limitation FEV1  ?Smoking 1/2 ppd. Usually he quits cold [place].  ?Asking when he should take symbicort and spiriva.  ?He does have a nebulizer machine but doesn't use it much.  ? ?I have reviewed the patient's family social and past medical history and updated as appropriate.  ? ?Past Medical History:  ?Diagnosis Date  ? COPD (chronic obstructive pulmonary disease) ([place])   ? Pneumonia   ? Prostate cancer ([place])   ? ? ?Past Surgical History:  ?Procedure Laterality Date  ? BACK SURGERY    ? HAND SURGERY    ? PROSTATE BIOPSY    ? TOTAL HIP ARTHROPLASTY Right 04/06/2019  ? Procedure: RIGHT TOTAL HIP ARTHROPLASTY ANTERIOR APPROACH;  Surgeon: Mcarthur Rossetti, MD;  Location: [facility];  Service: Orthopedics;  Laterality: Right;  ? TOTAL HIP ARTHROPLASTY Left 08/24/2019  ? TOTAL HIP ARTHROPLASTY Left 08/24/2019  ? Procedure: LEFT TOTAL HIP ARTHROPLASTY ANTERIOR APPROACH;  Surgeon: Mcarthur Rossetti, MD;  Location: [facility];  Service: Orthopedics;  Laterality: Left;  ? ? ?Family History  ?Problem Relation Age of Onset  ? COPD Mother   ? COPD Brother   ? Cancer Neg Hx   ? ? ?Social History  ? ?Occupational History  ? Not on file  ?Tobacco Use  ? Smoking status: Some Days  ?  Packs/day: 3.00  ?  Years: 54.00  ?  Pack years: 162.00  ?  Types: Cigarettes  ?  Last attempt to quit: 07/11/2016  ?  Years since quitting: 5.0  ? Smokeless tobacco: Never  ? Tobacco comments:  ?  occassionally 1-3 per week as of 08/13/21 ep  ?Vaping Use  ? Vaping Use: Never used  ?Substance and Sexual Activity  ?  Alcohol use: No  ? Drug use: No  ? Sexual activity: Not Currently  ? ? ? ?Physical Exam: ?Blood pressure 114/70, pulse 71, temperature 98.2 ?F (36.8 ?C), temperature source Oral, height 5' 8.5" (1.74 m), weight 192 lb 3.2 oz (87.2 kg), SpO2 94 %. ? ?Gen:      No acute distress ?ENT:  no nasal polyps, mucus membranes moist ?Lungs:    Diminished breath sounds, No increased respiratory effort, symmetric chest wall excursion, clear to auscultation bilaterally, no wheezes or crackles ?CV:         Regular rate and rhythm; no murmurs, rubs, or gallops.  No pedal edema ? ? ?Data Reviewed: ?Imaging: ?I have personally reviewed the October 2020 chest xray, no acute process.  ? ?PFTs: ? ?PFT Results Latest Ref Rng & Units 08/13/2021  ?FVC-Pre L 3.07  ?FVC-Predicted Pre % 74  ?FVC-Post L 3.53  ?FVC-Predicted Post % 86  ?Pre FEV1/FVC % % 39  ?Post FEV1/FCV % % 37  ?FEV1-Pre L 1.19  ?FEV1-Predicted Pre % 39  ?FEV1-Post L 1.29  ?DLCO uncorrected ml/min/mmHg 11.90  ?DLCO UNC% % 48  ?DLCO corrected ml/min/mmHg 11.90  ?DLCO COR %Predicted % 48  ?DLVA Predicted % 61  ?TLC L  7.88  ?TLC % Predicted % 117  ?RV % Predicted % 153  ? ?I have personally reviewed the patient's PFTs and very severe airflow limitation with air trapping, hyper inflation, and reduced dlco. Very severe emphysema.  ? ?Labs: ? ?Immunization status: ?Immunization History  ?Administered Date(s) Administered  ? Influenza,inj,Quad PF,6+ Mos 05/11/2018  ? Influenza-Unspecified 06/19/2011, 03/12/2012, 03/24/2013  ? Pneumococcal Conjugate-13 08/01/2014  ? Pneumococcal Polysaccharide-23 07/28/2013, 03/07/2018  ? Tdap 06/19/2011  ? ? ?External Records Personally Reviewed:  ? ?Assessment:  ?COPD, Very Severe FEV1 39% of predicted ?Tobacco use disorder ?Need for lung cancer screening.  ? ?Plan/Recommendations: ?Continue spiriva and symbicort. Continue prn albuterol ?Smoking cessation discussed. He is pre-contemplative.  ?Refer for lung cancer screening ? ?Smoking Cessation  Counseling: ? ?1. The patient is an everyday smoker and symptomatic due to the following condition COPD ?2. The patient is currently pre-contemplative in quitting smoking. ?3. I advised patient to quit smoking. ?4. We identified patient specific barriers to change.  ?5. I personally spent 3 minutes counseling the patient regarding tobacco use disorder. ?6. We discussed management of stress and anxiety to help with smoking cessation, when applicable. ?7. We discussed nicotine replacement therapy, Wellbutrin, Chantix as possible options. ?8. I advised setting a quit date. ?9. Follow?up arranged with our office to continue ongoing discussions. ?10.Resources given to patient including quit hotline.  ? ? ?Return to Care: ?Return in about 6 months (around 02/13/2022). ? ? ?Lenice Llamas, MD ?Pulmonary and Critical Care Medicine ?[facility] ?[phone] ? ? ? ? ? ?

## 2022-02-14 ENCOUNTER — Encounter: Payer: Self-pay | Admitting: Internal Medicine

## 2022-02-14 ENCOUNTER — Ambulatory Visit (INDEPENDENT_AMBULATORY_CARE_PROVIDER_SITE_OTHER): Payer: No Typology Code available for payment source | Admitting: Internal Medicine

## 2022-02-14 VITALS — BP 130/70 | HR 74 | Temp 98.1°F | Ht 68.5 in | Wt 192.8 lb

## 2022-02-14 DIAGNOSIS — J449 Chronic obstructive pulmonary disease, unspecified: Secondary | ICD-10-CM

## 2022-02-14 NOTE — Patient Instructions (Addendum)
Please schedule follow up scheduled with myself in 6 months.  If my schedule is not open yet, we will contact you with a reminder closer to that time. Please call [phone] if you haven't heard from [place] a month before.    Come see [place] sooner if your breathing worsens or you feel you're getting sick and need to be seen.   continue spiriva 2 puffs once a day and symbicort 2 puffs twice a day. Continue albuterol as needed [facility].  I am referring you to pulmonary rehab to help with your symptoms of shortness of breath related to COPD. They will call you to schedule this.

## 2022-02-14 NOTE — Progress Notes (Signed)
Steve Riley    [id number]    1950-12-21  Primary Care Physician:Pcp, No Date of Appointment: 02/14/2022 Established Patient Visit  Chief complaint:   Chief Complaint  Patient presents with   Follow-up    Harder to breath over past week     HPI: Steve Riley is a 71 y.o. 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   Interval Updates: Here for follow up Still taking spiriva 2 puffs once a day and symbicort 2 puffs twice a day.  Using albuterol nebs 2-3 times/month but it doesn't always help.  Regarding tobacco use, currently smoking but has cut back. Down to less than 1/2 ppd.   Having worsening shortness of breath over the past week.  He is also having trouble going to his exercise Denies fevers, chest tightness, worsening cough or weight loss.    I have reviewed the patient's family social and past medical history and updated as appropriate.   Past Medical History:  Diagnosis Date   COPD (chronic obstructive pulmonary disease) ([place])    Pneumonia    Prostate cancer ([place])     Past Surgical History:  Procedure Laterality Date   BACK SURGERY     HAND SURGERY     PROSTATE BIOPSY     TOTAL HIP ARTHROPLASTY Right 04/06/2019   Procedure: RIGHT TOTAL HIP ARTHROPLASTY ANTERIOR APPROACH;  Surgeon: Mcarthur Rossetti, MD;  Location: [facility];  Service: Orthopedics;  Laterality: Right;   TOTAL HIP ARTHROPLASTY Left 08/24/2019   TOTAL HIP ARTHROPLASTY Left 08/24/2019   Procedure: LEFT TOTAL HIP ARTHROPLASTY ANTERIOR APPROACH;  Surgeon: Mcarthur Rossetti, MD;  Location: [facility];  Service: Orthopedics;  Laterality: Left;    Family History  Problem Relation Age of Onset   COPD Mother    COPD Brother    Cancer Neg Hx     Social History   Occupational History   Not on file  Tobacco Use   Smoking status: Some Days    Packs/day: 3.00    Years: 54.00    Total pack years: 162.00    Types: Cigarettes    Last attempt to  quit: 07/11/2016    Years since quitting: 5.6   Smokeless tobacco: Never   Tobacco comments:    occassionally 1-3 per week as of 08/13/21 ep  Vaping Use   Vaping Use: Never used  Substance and Sexual Activity   Alcohol use: No   Drug use: No   Sexual activity: Not Currently     Physical Exam: Blood pressure 130/70, pulse 74, temperature 98.1 F (36.7 C), temperature source Oral, height 5' 8.5" (1.74 m), weight 192 lb 12.8 oz (87.5 kg), SpO2 92 %.  Gen:      No acute distress Lungs:   diminished, no wheezes or crackles CV:         Regular rate and rhythm; no murmurs, rubs, or gallops.  No pedal edema   Data Reviewed: Imaging: I have personally reviewed the October 2020 chest xray, no acute process.   PFTs:     Latest Ref Rng & Units 08/13/2021   11:03 AM  PFT Results  FVC-Pre L 3.07   FVC-Predicted Pre % 74   FVC-Post L 3.53   FVC-Predicted Post % 86   Pre FEV1/FVC % % 39   Post FEV1/FCV % % 37   FEV1-Pre L 1.19   FEV1-Predicted Pre % 39   FEV1-Post  L 1.29   DLCO uncorrected ml/min/mmHg 11.90   DLCO UNC% % 48   DLCO corrected ml/min/mmHg 11.90   DLCO COR %Predicted % 48   DLVA Predicted % 61   TLC L 7.88   TLC % Predicted % 117   RV % Predicted % 153    I have personally reviewed the patient's PFTs and very severe airflow limitation with air trapping, hyper inflation, and reduced dlco. Very severe emphysema.   Labs:  Immunization status: Immunization History  Administered Date(s) Administered   Influenza,inj,Quad PF,6+ Mos 05/11/2018   Influenza-Unspecified 06/19/2011, 03/12/2012, 03/24/2013   Pneumococcal Conjugate-13 08/01/2014   Pneumococcal Polysaccharide-23 07/28/2013, 03/07/2018   Td 01/07/2022   Tdap 06/19/2011    External Records Personally Reviewed:   Assessment:  COPD, Very Severe FEV1 39% of predicted Tobacco use disorder Need for lung cancer screening.   Plan/Recommendations: continue spiriva 2 puffs once a day and symbicort 2 puffs twice  a day. Continue albuterol as needed [facility].  I am referring you to pulmonary rehab to help with your symptoms of shortness of breath related to COPD. They will call you to schedule this.  Ambulatory desaturation study performed today - did not drop below 89%.    Return to Care: Return in about 6 months (around 08/15/2022).   Lenice Llamas, MD Pulmonary and [place]

## 2022-02-18 DIAGNOSIS — M810 Age-related osteoporosis without current pathological fracture: Secondary | ICD-10-CM | POA: Insufficient documentation

## 2022-02-20 ENCOUNTER — Telehealth (HOSPITAL_COMMUNITY): Payer: Self-pay | Admitting: *Deleted

## 2022-02-20 NOTE — Telephone Encounter (Signed)
Received referral from Dr. Shearon Stalls for this pt to participate in Cardiac rehab.  This pt who is a veteran has authorization from [facility] from the [place] for his pulmonary care however pulmonary rehab is not listed as a covered service.  Called and left pt message requesting call back to review with pt a new authorization is needed specifically for pulmonary rehab. Cherre Huger, BSN Cardiac and Training and development officer

## 2022-03-27 ENCOUNTER — Telehealth (HOSPITAL_COMMUNITY): Payer: Self-pay

## 2022-03-27 NOTE — Telephone Encounter (Signed)
Called and spoke with pt in regards to [place], pt stated he is not interested at this time. Due to the [place] not sending auth over.Pt stated if/when the [facility] sends the auth he will contact [place].   Closed referral

## 2022-08-28 ENCOUNTER — Ambulatory Visit: Payer: No Typology Code available for payment source | Admitting: Internal Medicine

## 2022-08-28 ENCOUNTER — Encounter: Payer: Self-pay | Admitting: Internal Medicine

## 2022-08-28 VITALS — BP 120/72 | HR 82 | Temp 97.6°F | Ht 68.5 in | Wt 195.2 lb

## 2022-08-28 DIAGNOSIS — J441 Chronic obstructive pulmonary disease with (acute) exacerbation: Secondary | ICD-10-CM | POA: Diagnosis not present

## 2022-08-28 MED ORDER — BUDESONIDE-FORMOTEROL FUMARATE 160-4.5 MCG/ACT IN AERO: 2.0000 | INHALATION_SPRAY | Freq: Two times a day (BID) | RESPIRATORY_TRACT | 5 refills | Status: DC

## 2022-08-28 MED ORDER — BREZTRI AEROSPHERE 160-9-4.8 MCG/ACT IN AERO: 2.0000 | INHALATION_SPRAY | Freq: Two times a day (BID) | RESPIRATORY_TRACT | 0 refills | Status: DC

## 2022-08-28 MED ORDER — PREDNISONE 20 MG PO TABS: 40.0000 mg | ORAL_TABLET | Freq: Every day | ORAL | 0 refills | Status: DC

## 2022-08-28 MED ORDER — ALBUTEROL SULFATE (2.5 MG/3ML) 0.083% IN NEBU: 2.5000 mg | INHALATION_SOLUTION | Freq: Four times a day (QID) | RESPIRATORY_TRACT | 12 refills | Status: DC | PRN

## 2022-08-28 NOTE — Addendum Note (Signed)
Addended by: Loma Sousa on: 08/28/2022 11:27 AM   Modules accepted: Orders

## 2022-08-28 NOTE — Progress Notes (Signed)
The patient has been prescribed the inhaler breztri. Inhaler technique was demonstrated to patient. The patient subsequently demonstrated correct technique. ° °

## 2022-08-28 NOTE — Progress Notes (Signed)
Steve Riley    [id number]    08/15/1950  Primary Care Physician:Woods, Mikeal Hawthorne, MD Date of Appointment: 08/28/2022 Established Patient Visit  Chief complaint:   Chief Complaint  Patient presents with   Follow-up    Sob, getting passed cold, sinus drainage. Wheezing      HPI: Steve Riley is a 72 y.o. 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   Interval Updates: Here for follow up. Pulmonary rehab called him and he said he is not interested at this time in [place] due to not having a VA authorization for this.   Currently getting over URI which started beginning of march. Took some "sudafed" and some "mucinex DM."  Still taking spiriva 2 puffs once a day and symbicort 2 puffs twice a day.  Wondering if he can start taking something else.   Using albuterol nebs and MDI the most and this seems to be helping. Taken 2-3 times/day even before he was sick.   Down to 4-5 cigarettes a day. Having worsening dyspnea with ADLS - showering, making a sandwich.   I have reviewed the patient's family social and past medical history and updated as appropriate.   Past Medical History:  Diagnosis Date   COPD (chronic obstructive pulmonary disease) ([place])    Pneumonia    Prostate cancer ([place])     Past Surgical History:  Procedure Laterality Date   BACK SURGERY     HAND SURGERY     PROSTATE BIOPSY     TOTAL HIP ARTHROPLASTY Right 04/06/2019   Procedure: RIGHT TOTAL HIP ARTHROPLASTY ANTERIOR APPROACH;  Surgeon: Mcarthur Rossetti, MD;  Location: [facility];  Service: Orthopedics;  Laterality: Right;   TOTAL HIP ARTHROPLASTY Left 08/24/2019   TOTAL HIP ARTHROPLASTY Left 08/24/2019   Procedure: LEFT TOTAL HIP ARTHROPLASTY ANTERIOR APPROACH;  Surgeon: Mcarthur Rossetti, MD;  Location: [facility];  Service: Orthopedics;  Laterality: Left;    Family History  Problem Relation Age of Onset   COPD Mother    COPD Brother    Cancer Neg Hx      Social History   Occupational History   Not on file  Tobacco Use   Smoking status: Some Days    Packs/day: 3.00    Years: 54.00    Additional pack years: 0.00    Total pack years: 162.00    Types: Cigarettes    Last attempt to quit: 07/11/2016    Years since quitting: 6.1   Smokeless tobacco: Never   Tobacco comments:    occassionally 1-3 per week as of 08/13/21 ep  Vaping Use   Vaping Use: Never used  Substance and Sexual Activity   Alcohol use: No   Drug use: No   Sexual activity: Not Currently     Physical Exam: Blood pressure 120/72, pulse 82, temperature 97.6 F (36.4 C), temperature source Oral, height 5' 8.5" (1.74 m), weight 195 lb 3.2 oz (88.5 kg), SpO2 96 %.  Gen:      No acute distress, frequent coughing Lungs:   diminished, no wheezes or crackles, increased RR CV:         RRR, no mrg   Data Reviewed: Imaging: I have personally reviewed the October 2020 chest xray, no acute process.   PFTs:     Latest Ref Rng & Units 08/13/2021   11:03 AM  PFT Results  FVC-Pre L 3.07   FVC-Predicted Pre %  74   FVC-Post L 3.53   FVC-Predicted Post % 86   Pre FEV1/FVC % % 39   Post FEV1/FCV % % 37   FEV1-Pre L 1.19   FEV1-Predicted Pre % 39   FEV1-Post L 1.29   DLCO uncorrected ml/min/mmHg 11.90   DLCO UNC% % 48   DLCO corrected ml/min/mmHg 11.90   DLCO COR %Predicted % 48   DLVA Predicted % 61   TLC L 7.88   TLC % Predicted % 117   RV % Predicted % 153    I have personally reviewed the patient's PFTs and very severe airflow limitation with air trapping, hyper inflation, and reduced dlco. Very severe emphysema.   Labs:  Immunization status: Immunization History  Administered Date(s) Administered   Influenza,inj,Quad PF,6+ Mos 05/11/2018   Influenza-Unspecified 06/19/2011, 03/12/2012, 03/24/2013   Pneumococcal Conjugate-13 08/01/2014   Pneumococcal Polysaccharide-23 07/28/2013, 03/07/2018   Td 01/07/2022   Tdap 06/19/2011    External Records  Personally Reviewed:   Assessment:  COPD, Very Severe FEV1 39% of predicted with acute exacerbation Tobacco use disorder Need for lung cancer screening.   Plan/Recommendations: Stop spiriva 2 puffs once a day and symbicort 2 puffs twice a day. Continue albuterol as needed Start breztri 2 puffs twice a day. If samples are useful will prescribe this and he will call in for it.  [facility].  Prednisone 40 mg x 5 days for exacerbation Refill albuterol nebs.   Will reach out to [facility] to figure out how to get authorization for pulmonary rehab from the [place].   Return to Care: Return in about 3 months (around 11/28/2022).   Lenice Llamas, MD Pulmonary and [place]

## 2022-08-28 NOTE — Patient Instructions (Addendum)
Please schedule follow up scheduled with myself in 3 months.  If my schedule is not open yet, we will contact you with a reminder closer to that time. Please call [phone] if you haven't heard from [place] a month before.   Stop spiriva 2 puffs once a day and symbicort 2 puffs twice a day.  Start Breztri 2 puffs twice a day, gargle after use. If this suits you let me know and I will prescribe it for you.  Continue albuterol as needed. I will Refill albuterol nebs.  Prednisone 40 mg x 5 days for exacerbation

## 2022-08-28 NOTE — Addendum Note (Signed)
Addended by: Loma Sousa on: 08/28/2022 11:38 AM   Modules accepted: Orders

## 2022-08-28 NOTE — Addendum Note (Signed)
Addended by: Loma Sousa on: 08/28/2022 11:37 AM   Modules accepted: Orders

## 2022-08-30 ENCOUNTER — Telehealth: Payer: Self-pay | Admitting: Internal Medicine

## 2022-08-30 ENCOUNTER — Telehealth (HOSPITAL_COMMUNITY): Payer: Self-pay | Admitting: *Deleted

## 2022-08-30 MED ORDER — BREZTRI AEROSPHERE 160-9-4.8 MCG/ACT IN AERO: 2.0000 | INHALATION_SPRAY | Freq: Two times a day (BID) | RESPIRATORY_TRACT | 0 refills | Status: DC

## 2022-08-30 MED ORDER — ALBUTEROL SULFATE HFA 108 (90 BASE) MCG/ACT IN AERS: 1.0000 | INHALATION_SPRAY | Freq: Four times a day (QID) | RESPIRATORY_TRACT | 4 refills | Status: DC | PRN

## 2022-08-30 MED ORDER — BREZTRI AEROSPHERE 160-9-4.8 MCG/ACT IN AERO: 2.0000 | INHALATION_SPRAY | Freq: Two times a day (BID) | RESPIRATORY_TRACT | 11 refills | Status: DC

## 2022-08-30 NOTE — Telephone Encounter (Signed)
Received referral from Dr. Shearon Stalls for this pt to participate in pulmonary rehab.  Pt participated back in 2020 but had to stop due to hip pain.  Reviewed VA authorization in media.  Pulmonary rehab is not a service that is listed on the authorization.  This is a similar issue we had back in September of 2023.  Called and spoke with pt regarding any other medical insurance.  Pt has [employer] which will cover [place] however the copay is too high for him.  Limited income and his rent went up 100.00 even with  the increase on social security of 40.00 he can not cover the expense.  During this conversation, pt is heard coughing sounds dry in nature.  Complains of feeling fatigue and no energy.  He is using albuterol inhaler that someone gave him and nebulizer that is out of date.  He is waiting on the medications to come from the [place] but it can take 10-14 days.  Axiel is to go over to Dr. Shearon Stalls office and pick up samples of the [place].  Cautioned pt to get himself ready to go as the office will be closing soon.  Plans to drink some more water and get going as soon as he can catch his breath. Told him to ask to see if they have albuterol inhaler or nebulizer solution.  I am uncertain if they would have these medications. Would benefit from social worker assessment to help bridge the gaps. Unsure if there is a Education officer, museum at [employer].  Will route this note to Dr. Shearon Stalls st the referring provider.  Cherre Huger, BSN Cardiac and Training and development officer

## 2022-08-30 NOTE — Telephone Encounter (Signed)
Pt liked the sample inhaler Dr. Keturah Barre gave him wand wonders if we can call a script in to the [place].  AVS notes read: Start Breztri 2 puffs twice a day, gargle after use. If this suits you let me know and I will prescribe it for you.   [facility] [place]

## 2022-08-30 NOTE — Telephone Encounter (Signed)
Dr. Mauricio Po AVS Inst:  Start Breztri 2 puffs twice a day, gargle after use. If this suits you let me know and I will prescribe it for you.   Continue albuterol as needed. I will Refill albuterol nebs.   PT calling wanting to be sure Albuterol and Breztri sent in. Takes 21 days to get it by mail so he wonders if we can provide samples for him. Pls call PT @ [phone]

## 2022-08-30 NOTE — Telephone Encounter (Signed)
Called and spoke with patient. He is aware that the Steve Riley has been sent to the [facility]. He is also aware that I have placed 2 samples of Breztri at the front desk.   Nothing further needed at time of call.

## 2022-08-30 NOTE — Telephone Encounter (Signed)
Breztri sent to the pharmacy. Will likely need PA.

## 2022-08-30 NOTE — Telephone Encounter (Signed)
Also needs rescue inhaler called in. This would be a mail in [place] so can he get another sample left for him?

## 2022-08-30 NOTE — Telephone Encounter (Signed)
Dr. Shearon Stalls please advise Patient advises Breztri inhaler is working well and would like a script sent in. Also wants albuterol inhaler filled. Just wanted to make sure your okay with me sending both in

## 2022-09-04 ENCOUNTER — Telehealth (HOSPITAL_COMMUNITY): Payer: Self-pay | Admitting: *Deleted

## 2022-09-04 ENCOUNTER — Telehealth: Payer: Self-pay | Admitting: Internal Medicine

## 2022-09-04 DIAGNOSIS — J441 Chronic obstructive pulmonary disease with (acute) exacerbation: Secondary | ICD-10-CM

## 2022-09-04 NOTE — Telephone Encounter (Signed)
Carlette states patient having symptoms of shortness of breath and cough. Would like our office to call patient. Patient phone number is [phone]. [facility] phone number is [phone].

## 2022-09-04 NOTE — Telephone Encounter (Signed)
We do not have a Education officer, museum. I will ask our PCCs who obtains authorization.

## 2022-09-04 NOTE — Telephone Encounter (Signed)
He was prescribed prednisone last week. Did he take it?

## 2022-09-04 NOTE — Telephone Encounter (Signed)
Called and spoke with patient. He stated that he finished the prednisone on Monday 09/02/22.

## 2022-09-04 NOTE — Telephone Encounter (Signed)
Spoke with Dr. Shearon Stalls. She requested for the patient to come in for CXR and OV tomorrow at 1130am. Called and spoke with patient. He verbalized understanding.   Nothing further needed at time of call.

## 2022-09-04 NOTE — Telephone Encounter (Signed)
Called and spoke with patient. He stated that his chest congestion has increased since his last visit. He has not been able to cough up any phlegm to see what color it is. He also has been wheezing more, especially at night. He denied any fevers, body aches or chills. He has been using samples of Breztri until the [place] can ship his regular supply. At first the [place] seemed to help but now he can not tell a difference. He has had to increase his usage of the albuterol HFA and neb treatments. His current supply of albuterol are both expired but he has a shipment coming from the [place] soon.   He wanted to know if Dr. Shearon Stalls could send in something for him.   [facility] [address].   Dr. Shearon Stalls, can you please advise? Thanks!

## 2022-09-04 NOTE — Telephone Encounter (Signed)
Called pt regarding his general well being.  Pt in the bed . Pt is on the nebulizer and I hear a deep congested cough that is non-productive.  Pt state he is miserable because his breathing is so bad.  Using rescue inhalers more frequently than prescribed. Admitted that the samples  of Breztri he picked up from Dr. Shearon Stalls office helped at first but he feels like they aren't doing him any good. Placed pulse oximeter on his finger he reports he doesn't know which number is which.  I asked him to give me both numbers in hopes that I could figure out which is which.  He first reported both number in the 60's but this quickly changed to the 80's.  Pt stated multiple numbers some in the 20's others in the 60's .  Very difficult to assess what his O2 saturation may be.  Asked if had oxygen, stated he use to until about a hear ago.  Va told me "It wasn't doing me any good".  Will contact Dr. Shearon Stalls office to have one of the nurses in triage give him a call and determine if he needs to be seen or emergency room care. Cherre Huger, BSN Cardiac and Training and development officer

## 2022-09-05 ENCOUNTER — Encounter: Payer: Self-pay | Admitting: Internal Medicine

## 2022-09-05 ENCOUNTER — Ambulatory Visit (INDEPENDENT_AMBULATORY_CARE_PROVIDER_SITE_OTHER): Payer: No Typology Code available for payment source

## 2022-09-05 ENCOUNTER — Ambulatory Visit (INDEPENDENT_AMBULATORY_CARE_PROVIDER_SITE_OTHER): Payer: No Typology Code available for payment source | Admitting: Internal Medicine

## 2022-09-05 VITALS — BP 134/80 | HR 82 | Temp 98.4°F | Ht 68.5 in | Wt 195.0 lb

## 2022-09-05 DIAGNOSIS — J439 Emphysema, unspecified: Secondary | ICD-10-CM

## 2022-09-05 DIAGNOSIS — J4489 Other specified chronic obstructive pulmonary disease: Secondary | ICD-10-CM | POA: Diagnosis not present

## 2022-09-05 DIAGNOSIS — J441 Chronic obstructive pulmonary disease with (acute) exacerbation: Secondary | ICD-10-CM | POA: Diagnosis not present

## 2022-09-05 MED ORDER — ALBUTEROL SULFATE (2.5 MG/3ML) 0.083% IN NEBU: 2.5000 mg | INHALATION_SOLUTION | Freq: Four times a day (QID) | RESPIRATORY_TRACT | 5 refills | Status: DC | PRN

## 2022-09-05 MED ORDER — ALBUTEROL SULFATE (2.5 MG/3ML) 0.083% IN NEBU
2.5000 mg | INHALATION_SOLUTION | Freq: Four times a day (QID) | RESPIRATORY_TRACT | Status: AC
  Administered 2022-09-05: 2.5 mg via RESPIRATORY_TRACT

## 2022-09-05 NOTE — Addendum Note (Signed)
Addended byOralia Rud M on: 09/05/2022 12:11 PM   Modules accepted: Orders

## 2022-09-05 NOTE — Progress Notes (Addendum)
LAWRENC Riley    [id number]    1951-02-15  Primary Care Physician:Woods, Mikeal Hawthorne, MD Date of Appointment: 09/05/2022 Established Patient Visit  Chief complaint:   Chief Complaint  Patient presents with   Acute Visit    Sinus, ear and chest congestion since first of March 2024.  SOB and low energy.     HPI: Steve Riley is a 72 y.o. 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   Interval Updates: Here for acute visit after one week. Last week was changed from spiriva and symbicort to breztri. Was treated for COPD exacerbation with prednisone. He took this and has had no improvement.   Using nebulizer machine and this is helping some. His albuterol is expired at home, although I sent some last week.  No fevers or chills, sore throat. Appetite is ok.   He appears sad and anxious today during our visit. He would like to cut back on smoking but it feels very hard. He denies feeling depressed.  I have reviewed the patient's family social and past medical history and updated as appropriate.   Past Medical History:  Diagnosis Date   COPD (chronic obstructive pulmonary disease) ([place])    Pneumonia    Prostate cancer ([place])     Past Surgical History:  Procedure Laterality Date   BACK SURGERY     HAND SURGERY     PROSTATE BIOPSY     TOTAL HIP ARTHROPLASTY Right 04/06/2019   Procedure: RIGHT TOTAL HIP ARTHROPLASTY ANTERIOR APPROACH;  Surgeon: Mcarthur Rossetti, MD;  Location: [place];  Service: Orthopedics;  Laterality: Right;   TOTAL HIP ARTHROPLASTY Left 08/24/2019   TOTAL HIP ARTHROPLASTY Left 08/24/2019   Procedure: LEFT TOTAL HIP ARTHROPLASTY ANTERIOR APPROACH;  Surgeon: Mcarthur Rossetti, MD;  Location: [place];  Service: Orthopedics;  Laterality: Left;    Family History  Problem Relation Age of Onset   COPD Mother    COPD Brother    Cancer Neg Hx     Social History   Occupational History   Not on file  Tobacco  Use   Smoking status: Every Day    Packs/day: 0.25    Years: 54.00    Additional pack years: 0.00    Total pack years: 13.50    Types: Cigarettes    Last attempt to quit: 07/11/2016    Years since quitting: 6.1   Smokeless tobacco: Never   Tobacco comments:    occassionally 1-3 cigarettes per day .  Updated 09/05/2022.  Vaping Use   Vaping Use: Never used  Substance and Sexual Activity   Alcohol use: No   Drug use: No   Sexual activity: Not Currently     Physical Exam: Blood pressure 134/80, pulse 82, temperature 98.4 F (36.9 C), temperature source Oral, height 5' 8.5" (1.74 m), weight 195 lb (88.5 kg), SpO2 98 %.  Gen:      No acute distress Lungs:   no increased work of breathing, no wheezes or crackles CV:         RRR no mrg Psych: appear flat affect  Data Reviewed: Imaging: I have personally reviewed the chest xray 3/28 which shows no acute process, chronic COPD.   PFTs:     Latest Ref Rng & Units 08/13/2021   11:03 AM  PFT Results  FVC-Pre L 3.07   FVC-Predicted Pre % 74   FVC-Post L 3.53   FVC-Predicted  Post % 86   Pre FEV1/FVC % % 39   Post FEV1/FCV % % 37   FEV1-Pre L 1.19   FEV1-Predicted Pre % 39   FEV1-Post L 1.29   DLCO uncorrected ml/min/mmHg 11.90   DLCO UNC% % 48   DLCO corrected ml/min/mmHg 11.90   DLCO COR %Predicted % 48   DLVA Predicted % 61   TLC L 7.88   TLC % Predicted % 117   RV % Predicted % 153    I have personally reviewed the patient's PFTs and very severe airflow limitation with air trapping, hyper inflation, and reduced dlco. Very severe emphysema.   Labs:  Immunization status: Immunization History  Administered Date(s) Administered   Influenza,inj,Quad PF,6+ Mos 05/11/2018   Influenza-Unspecified 06/19/2011, 03/12/2012, 03/24/2013   Pneumococcal Conjugate-13 08/01/2014   Pneumococcal Polysaccharide-23 07/28/2013, 03/07/2018   Td 01/07/2022   Tdap 06/19/2011    External Records Personally Reviewed:   Assessment:   COPD, Very Severe FEV1 39% of predicted with progressing symptoms.  Tobacco use disorder Need for lung cancer screening.   Plan/Recommendations: Continue breztri, Continue albuterol as needed. Need to use nebulizer treatments more frequently.  Neb given in office today.   He has previously been unable to tolerate wixela with spiriva and mometasone with stiolto due to inability to tolerate DPI due to the severity of his obstruction of COPD. Has had progression of symptoms on these two separate inhalers.   Chest xray reviewed - no evidence of pneumonia. Prednisone was not helpful so would not do another course at this time.  [facility]  Will obtain authorization from the [place] for pulmonary rehab. He has been referred.    Return to Care: Return in about 3 months (around 12/06/2022).   Durel Salts, MD Pulmonary and Critical Care Medicine [facility] Office:[phone]

## 2022-09-05 NOTE — Addendum Note (Signed)
Addended byOralia Rud M on: 09/05/2022 03:12 PM   Modules accepted: Orders

## 2022-09-05 NOTE — Patient Instructions (Addendum)
Please schedule follow up scheduled with myself in 3 months.  If my schedule is not open yet, we will contact you with a reminder closer to that time. Please call [phone] if you haven't heard from [place] a month before.   Your chest xray today shows no evidence of pneumonia.   Continue your Breztri 2 puffs in the morning, 2 puffs at night.  You can take nebulizer treatments up to 4 times a day as needed to help with shortness of breath.  We will work on getting authorization from the [place] for you for pulmonary rehab.

## 2022-09-05 NOTE — Addendum Note (Signed)
Addended byOralia Rud M on: 09/05/2022 02:09 PM   Modules accepted: Orders

## 2022-09-06 ENCOUNTER — Telehealth: Payer: Self-pay

## 2022-09-06 NOTE — Telephone Encounter (Signed)
PA request received via Fax from provider/[facility] for [facility]  Forms completed and chart notes attached and faxed back to [place] @ [phone]  Received notice that fax was transmitted successfully   Pending determination.

## 2022-09-10 ENCOUNTER — Other Ambulatory Visit: Payer: Self-pay

## 2022-09-10 ENCOUNTER — Telehealth: Payer: Self-pay | Admitting: Internal Medicine

## 2022-09-10 MED ORDER — ALBUTEROL SULFATE (2.5 MG/3ML) 0.083% IN NEBU: 2.5000 mg | INHALATION_SOLUTION | Freq: Four times a day (QID) | RESPIRATORY_TRACT | 5 refills | Status: DC | PRN

## 2022-09-10 MED ORDER — BREZTRI AEROSPHERE 160-9-4.8 MCG/ACT IN AERO: 2.0000 | INHALATION_SPRAY | Freq: Two times a day (BID) | RESPIRATORY_TRACT | 6 refills | Status: DC

## 2022-09-10 NOTE — Telephone Encounter (Signed)
Called the [place] spoke to a representative she states she would forward message to pharmacist. She states due to confidential reasons they would not be able to call back to confirm if they have received Breztri and nebulizer solution.  I called patient and gave him an update I advised I would resend medication to [facility] and he can call to verify if they have it or not. Patient states he may come and get printed scripts and take directly to pharmacy if they dont have them. Closing encounter for now. nfn

## 2022-09-10 NOTE — Telephone Encounter (Signed)
Received a call from [facility] stating that they did receive two prescriptions on pt (albuterol neb sol and Breztri). Nothing further needed.

## 2022-09-11 ENCOUNTER — Telehealth: Payer: Self-pay | Admitting: Internal Medicine

## 2022-09-11 MED ORDER — BREZTRI AEROSPHERE 160-9-4.8 MCG/ACT IN AERO: 2.0000 | INHALATION_SPRAY | Freq: Two times a day (BID) | RESPIRATORY_TRACT | 6 refills | Status: DC

## 2022-09-11 NOTE — Telephone Encounter (Signed)
Closed encounter by mistake had to open a new one.

## 2022-09-11 NOTE — Telephone Encounter (Signed)
PT calling again, see last signed encounter, for the following RX issues:  1.) The Pharmacy at the [facility] is telling him the script sent over for [facility] is off by one digit that populates other inhalers. Please recheck that RX.  2.) A RX for Albuterol for his nebulizer was sent over. It was for 7.5 days supply. Dr.'s instructions were to take 4 X's per day. [facility] inst say "Take every 6 hours or as needed". Who should he obey?  2 1/2: He only has about a 6 day supply of the Albuterol Sulfate and will run out soon based on her usage inst. He also uses a email pharm that takes some time to get to him.   3.) Dr. Shearon Stalls said she'd send over for an emergency Albuterol inhaler he could take on the go. One was not waiting for him  Pls call PT to advise @ [phone] to put him at ease. He is very upset over these issues.  [facility]: [facility] (Al[facility]erol Sulfate)

## 2022-09-11 NOTE — Telephone Encounter (Signed)
Sent Budeson-Glycopyrrol-Formoterol (BREZTRI AEROSPHERE) 160-9-4.8 MCG/ACT AERO to [facility] with corrected amount to use. Called pt but LMTCB. Will leave encounter open for pt to call back.

## 2022-09-12 ENCOUNTER — Telehealth: Payer: Self-pay | Admitting: Internal Medicine

## 2022-09-12 NOTE — Telephone Encounter (Signed)
Had to repopen Tel Encounter (See last signed) because it was signed in error. PT calling again and I adv him we called in the [facility] but acknowledged we still need to answer his other questions.  Pls call PT.   Orig call:  PT calling again, see last signed encounter, for the following RX issues:   1.) The Pharmacy at the [facility] is telling him the script sent over for [facility] is off by one digit that populates other inhalers. Please recheck that RX.   2.) A RX for Albuterol for his nebulizer was sent over. It was for 7.5 days supply. Dr.'s instructions were to take 4 X's per day. [facility] inst say "Take every 6 hours or as needed". Who should he obey?   2 1/2: He only has about a 6 day supply of the Albuterol Sulfate and will run out soon based on her usage inst. He also uses a email pharm that takes some time to get to him.    3.) Dr. Shearon Stalls said she'd send over for an emergency Albuterol inhaler he could take on the go. One was not waiting for him   Pls call PT to advise @ [phone] to put him at ease. He is very upset over these issues.   [facility] (Albuterol Sulfate)

## 2022-09-12 NOTE — Telephone Encounter (Signed)
Spoke with the [place] pharmacy who stated that they have not received any of the criteria showing that pt has tried/failed the preferred alternatives for the [facility]. Due to this, they are not able to fill the Rx for pt until this has been received.   The [place] pharmacy is going to fax over to [place] this information. In the meantime while we are waiting on this, going to send this to prior auth team to see if they can help [place] out while we are waiting on the fax from the [place] to come.

## 2022-09-13 ENCOUNTER — Encounter (HOSPITAL_COMMUNITY)
Admission: RE | Admit: 2022-09-13 | Discharge: 2022-09-13 | Disposition: A | Discharge status: Home / Self Care | Payer: No Typology Code available for payment source | Source: Ambulatory Visit | Attending: Internal Medicine | Admitting: Internal Medicine

## 2022-09-13 ENCOUNTER — Encounter (HOSPITAL_COMMUNITY): Payer: Self-pay

## 2022-09-13 VITALS — BP 140/68 | HR 73 | Resp 20 | Ht 68.0 in | Wt 188.1 lb

## 2022-09-13 DIAGNOSIS — G4733 Obstructive sleep apnea (adult) (pediatric): Secondary | ICD-10-CM | POA: Diagnosis present

## 2022-09-13 NOTE — Progress Notes (Signed)
Steve Riley 72 y.o. male Pulmonary Rehab Orientation Note  This patient who [facility] Everardo All covering) with the diagnosis of OSA arrived today in Cardiac and Pulmonary Rehab. He arrived ambulatory with normal gait. He does not carry portable oxygen. Color good, skin warm and dry. Patient is oriented to time and place.   Patient's medical history, psychosocial health, and medications reviewed. Psychosocial assessment reveals patient lives with alone, in a retirement facility. Steve Riley is currently retired. Patient hobbies include watching tv, participating in his facility's activities, and reading the Bible. Patient report his stress level is moderate. Areas of stress/anxiety include health, family , and finances. Patient does exhibit signs of depression, but pt denies he is depressed. Signs of depression include anxiety, crying spells, helplessness, hopelessness, and sadness and fatigue. PHQ2/9 score 2/7. Steve Riley shows fair  coping skills with positive outlook on life. Offered emotional support and reassurance. We will continue to monitor and evaluate progress toward psychosocial goal(s) of using positive coping skills when managing stress.   Physical assessment reveals patient is alert and oriented x 4. Heart rate is normal, breath sounds clear to auscultation, wheezes with exertion could be heard while performing his 6 MWT. Reports productive cough. Bowel sounds present. Pt denies abdominal discomfort, nausea, vomiting or diarrhea. Grip strength equal, strong. Distal pulses +2; no swelling to lower extremities. Pt with non-healing sores to back x2 and left shin.    Steve Riley reports he  does take medications as prescribed. Patient states he follows a regular  diet. The patient has been trying to lose weight through a healthy diet and exercise program. Pt's weight will be monitored closely.   Demonstration and practice of PLB using pulse oximeter. Steve Riley able to return  demonstration satisfactorily. Safety and hand hygiene in the exercise area reviewed with patient. Steve Riley voices understanding of the information reviewed. Department expectations discussed with patient and achievable goals were set. The patient shows enthusiasm about attending the program and we look forward to working with Steve Riley. Steve Riley completed a 6 min walk test today and is scheduled to begin exercise on 4/11 at 1015.   [id number] Steve Hart, RN, BSN

## 2022-09-13 NOTE — Progress Notes (Signed)
Pulmonary Individual Treatment Plan  Patient Details  Name: Steve Riley MRN: [id number] Date of Birth: Mar 23, 1951 Referring Provider:   [name] Pulmonary Rehab Walk Test from 09/13/2022 in [facility]  [name] coverage)       Initial Encounter Date:  Flowsheet Row Pulmonary Rehab Walk Test from 09/13/2022 in [facility]  Date 09/13/22       Visit Diagnosis: OSA (obstructive sleep apnea)  Patient's Home Medications on Admission:   Current Outpatient Medications:    albuterol (PROVENTIL) (2.5 MG/3ML) 0.083% nebulizer solution, Take 3 mLs (2.5 mg total) by nebulization every 6 (six) hours as needed for wheezing or shortness of breath., Disp: 75 mL, Rfl: 12   albuterol (PROVENTIL) (2.5 MG/3ML) 0.083% nebulizer solution, Take 3 mLs (2.5 mg total) by nebulization every 6 (six) hours as needed for wheezing or shortness of breath., Disp: 75 mL, Rfl: 5   APPLE CIDER VINEGAR PO, Take by mouth in the morning and at bedtime. , Disp: , Rfl:    Ascorbic Acid (VITAMIN C WITH ROSE HIPS) 1000 MG tablet, Take 1,000 mg by mouth daily., Disp: , Rfl:    Budeson-Glycopyrrol-Formoterol (BREZTRI AEROSPHERE) 160-9-4.8 MCG/ACT AERO, Inhale 2 puffs into the lungs in the morning and at bedtime., Disp: 2 each, Rfl: 0   Calcium Carbonate-Vitamin D 600-400 MG-UNIT chew tablet, Chew 1 tablet by mouth 2 (two) times daily. , Disp: , Rfl:    Cholecalciferol (DIALYVITE VITAMIN D 5000) 125 MCG (5000 UT) capsule, Take 5,000 Units by mouth daily., Disp: , Rfl:    Cyanocobalamin (B-12) 5000 MCG CAPS, Take 5,000 mcg by mouth daily., Disp: , Rfl:    Flaxseed, Linseed, (FLAX PO), Take 2,600 mg by mouth daily., Disp: , Rfl:    folic acid (FOLVITE) 1 MG tablet, Take 1 tablet by mouth daily., Disp: , Rfl:    Methylsulfonylmethane (MSM) 1000 MG CAPS, Take 1,000 mg by mouth 2 (two) times  daily., Disp: , Rfl:    OVER THE COUNTER MEDICATION, Take 4,000 Units by mouth in the morning and at bedtime. Serrapeptase, Disp: , Rfl:    Probiotic CAPS, Take 1 capsule by mouth 2 (two) times daily with a meal., Disp: , Rfl:    selenium 200 MCG TABS tablet, Take 200 mcg by mouth daily., Disp: , Rfl:    silver sulfADIAZINE (SILVADENE) 1 % cream, Apply 1 application  topically in the morning, at noon, and at bedtime., Disp: , Rfl:    albuterol (VENTOLIN HFA) 108 (90 Base) MCG/ACT inhaler, Inhale 1-2 puffs into the lungs every 6 (six) hours as needed for wheezing. (Patient not taking: Reported on 09/13/2022), Disp: 18 g, Rfl: 4   aspirin 81 MG chewable tablet, Chew 1 tablet (81 mg total) by mouth 2 (two) times daily. (Patient not taking: Reported on 02/14/2022), Disp: 60 tablet, Rfl: 0   Budeson-Glycopyrrol-Formoterol (BREZTRI AEROSPHERE) 160-9-4.8 MCG/ACT AERO, Inhale 2 puffs into the lungs in the morning and at bedtime., Disp: 1 each, Rfl: 11   Budeson-Glycopyrrol-Formoterol (BREZTRI AEROSPHERE) 160-9-4.8 MCG/ACT AERO, Inhale 2 puffs into the lungs in the morning and at bedtime., Disp: 2 each, Rfl: 6   budesonide-formoterol (SYMBICORT) 160-4.5 MCG/ACT inhaler, Inhale 2 puffs into the lungs 2 (two) times daily. (Patient not taking: Reported on 09/05/2022), Disp: 1 each, Rfl: 5   methocarbamol (ROBAXIN) 500 MG tablet, Take 1 tablet (500 mg total) by mouth every  6 (six) hours as needed for muscle spasms. (Patient not taking: Reported on 02/14/2022), Disp: 60 tablet, Rfl: 0   ondansetron (ZOFRAN) 4 MG tablet, Take 1 tablet (4 mg total) by mouth every 8 (eight) hours as needed for up to 20 doses for nausea or vomiting. (Patient not taking: Reported on 02/14/2022), Disp: 20 tablet, Rfl: 0   oxyCODONE (OXY IR/ROXICODONE) 5 MG immediate release tablet, Take 1-2 tablets (5-10 mg total) by mouth every 4 (four) hours as needed for moderate pain (pain score 4-6). (Patient not taking: Reported on 02/14/2022), Disp: 30 tablet,  Rfl: 0   predniSONE (DELTASONE) 20 MG tablet, Take 2 tablets (40 mg total) by mouth daily with breakfast. (Patient not taking: Reported on 09/05/2022), Disp: 10 tablet, Rfl: 0   Tiotropium Bromide Monohydrate (SPIRIVA RESPIMAT) 2.5 MCG/ACT AERS, Inhale 1 puff into the lungs in the morning and at bedtime. (Patient not taking: Reported on 09/05/2022), Disp: , Rfl:    traZODone (DESYREL) 100 MG tablet, Take 100 mg by mouth at bedtime. (Patient not taking: Reported on 02/14/2022), Disp: , Rfl:    Turmeric Curcumin 500 MG CAPS, Take 500 mg by mouth in the morning and at bedtime.  (Patient not taking: Reported on 09/05/2022), Disp: , Rfl:    Zinc 50 MG TABS, Take 50 mg by mouth daily. (Patient not taking: Reported on 02/14/2022), Disp: , Rfl:   Past Medical History: Past Medical History:  Diagnosis Date   COPD (chronic obstructive pulmonary disease)    Pneumonia    Prostate cancer     Tobacco Use: Social History   Tobacco Use  Smoking Status Every Day   Packs/day: 0.25   Years: 54.00   Additional pack years: 0.00   Total pack years: 13.50   Types: Cigarettes   Last attempt to quit: 07/11/2016   Years since quitting: 6.1  Smokeless Tobacco Never  Tobacco Comments   occassionally 1-3 cigarettes per day .  Updated 09/05/2022.    Labs: Review Flowsheet       Latest Ref Rng & Units 03/06/2018 03/07/2018  Labs for ITP Cardiac and Pulmonary Rehab  Hemoglobin A1c 4.8 - 5.6 % - 6.5   Bicarbonate 20.0 - 28.0 mmol/L 22.9  -  TCO2 22 - 32 mmol/L 24  -  Acid-base deficit 0.0 - 2.0 mmol/L 1.0  -  O2 Saturation % 81.0  -    Capillary Blood Glucose: Lab Results  Component Value Date   GLUCAP 92 08/18/2019   GLUCAP 86 03/09/2018   GLUCAP 104 (H) 03/09/2018   GLUCAP 139 (H) 03/08/2018   GLUCAP 225 (H) 03/07/2018     Pulmonary Assessment Scores:  Pulmonary Assessment Scores     Row Name 09/13/22 1037         ADL UCSD   SOB Score total 58       CAT Score   CAT Score 19       mMRC Score    mMRC Score 4             UCSD: Self-administered rating of dyspnea associated with activities of daily living (ADLs) 6-point scale (0 = "not at all" to 5 = "maximal or unable to do because of breathlessness")  Scoring Scores range from 0 to 120.  Minimally important difference is 5 units  CAT: CAT can identify the health impairment of COPD patients and is better correlated with disease progression.  CAT has a scoring range of zero to 40. The CAT score is classified  into four groups of low (less than 10), medium (10 - 20), high (21-30) and very high (31-40) based on the impact level of disease on health status. A CAT score over 10 suggests significant symptoms.  A worsening CAT score could be explained by an exacerbation, poor medication adherence, poor inhaler technique, or progression of COPD or comorbid conditions.  CAT MCID is 2 points  mMRC: mMRC (Modified Medical Research Council) Dyspnea Scale is used to assess the degree of baseline functional disability in patients of respiratory disease due to dyspnea. No minimal important difference is established. A decrease in score of 1 point or greater is considered a positive change.   Pulmonary Function Assessment:  Pulmonary Function Assessment - 09/13/22 1013       Breath   Bilateral Breath Sounds Clear   Expiratory wheezes with exertion   Shortness of Breath Yes;Limiting activity;Panic with Shortness of Breath             Exercise Target Goals: Exercise Program Goal: Individual exercise prescription set using results from initial 6 min walk test and THRR while considering  patient's activity barriers and safety.   Exercise Prescription Goal: Initial exercise prescription builds to 30-45 minutes a day of aerobic activity, 2-3 days per week.  Home exercise guidelines will be given to patient during program as part of exercise prescription that the participant will acknowledge.  Activity Barriers & Risk Stratification:   Activity Barriers & Cardiac Risk Stratification - 09/13/22 1008       Activity Barriers & Cardiac Risk Stratification   Activity Barriers Left Hip Replacement;Right Hip Replacement;Shortness of Breath;Muscular Weakness;Deconditioning;Balance Concerns             6 Minute Walk:  6 Minute Walk     Row Name 09/13/22 1147         6 Minute Walk   Phase Initial     Distance 1124 feet     Walk Time 6 minutes     # of Rest Breaks 1  5:07-6:00     MPH 2.13     METS 2.5     RPE 17     Perceived Dyspnea  3     VO2 Peak 8.76     Symptoms Yes (comment)     Comments Pt had to stop and rest at 5 min with accessory muscle use and sats dropping to 79%. O2 was palced @ 2L with sats recovering to 94% after O2 placed.     Resting HR 69 bpm     Resting BP 140/68     Resting Oxygen Saturation  94 %     Exercise Oxygen Saturation  during 6 min walk 79 %     Max Ex. HR 96 bpm     Max Ex. BP 154/70     2 Minute Post BP 138/70       Interval HR   1 Minute HR 79     2 Minute HR 84     3 Minute HR 88     4 Minute HR 96     5 Minute HR 86     6 Minute HR 89     2 Minute Post HR 77     Interval Heart Rate? Yes       Interval Oxygen   Baseline Oxygen Saturation % 94 %     1 Minute Oxygen Saturation % 94 %     1 Minute Liters of Oxygen 0 L  2 Minute Oxygen Saturation % 95 %     2 Minute Liters of Oxygen 0 L     3 Minute Oxygen Saturation % 92 %     3 Minute Liters of Oxygen 0 L     4 Minute Oxygen Saturation % 95 %     4 Minute Liters of Oxygen 0 L     5 Minute Oxygen Saturation % 79 %  placed pt on 2L     5 Minute Liters of Oxygen 0 L     6 Minute Oxygen Saturation % 94 %     6 Minute Liters of Oxygen 2 L     2 Minute Post Oxygen Saturation % 96 %     2 Minute Post Liters of Oxygen 0 L              Oxygen Initial Assessment:  Oxygen Initial Assessment - 09/13/22 1012       Home Oxygen   Home Oxygen Device None    Sleep Oxygen Prescription None    Home Exercise  Oxygen Prescription None    Home Resting Oxygen Prescription None      Initial 6 min Walk   Oxygen Used None      Program Oxygen Prescription   Program Oxygen Prescription Continuous    Liters per minute 2    Comments During walk test on RA, pt desaturated down to 79%. Placed on 2L [place].      Intervention   Short Term Goals To learn and exhibit compliance with exercise, home and travel O2 prescription;To learn and understand importance of maintaining oxygen saturations>88%;To learn and demonstrate proper use of respiratory medications;To learn and understand importance of monitoring SPO2 with pulse oximeter and demonstrate accurate use of the pulse oximeter.;To learn and demonstrate proper pursed lip breathing techniques or other breathing techniques.     Long  Term Goals Exhibits compliance with exercise, home  and travel O2 prescription;Maintenance of O2 saturations>88%;Compliance with respiratory medication;Verbalizes importance of monitoring SPO2 with pulse oximeter and return demonstration;Exhibits proper breathing techniques, such as pursed lip breathing or other method taught during program session;Demonstrates proper use of MDI's             Oxygen Re-Evaluation:  Oxygen Re-Evaluation     Row Name 09/13/22 1012             Goals/Expected Outcomes   Comments [facility]       Goals/Expected Outcomes Compliance with home oxygen when obtained.                Oxygen Discharge (Final Oxygen Re-Evaluation):  Oxygen Re-Evaluation - 09/13/22 1012       Goals/Expected Outcomes   Comments [facility] to [facility]    Goals/Expected Outcomes Compliance with home oxygen when obtained.             Initial Exercise Prescription:  Initial Exercise Prescription - 09/13/22 1100       Date of Initial Exercise RX and Referring Provider   Date 09/13/22    [name] coverage)    Expected Discharge Date 12/10/22       Oxygen   Oxygen Intermittent    Liters 2    Maintain Oxygen Saturation 88% or higher      Prescription Details   Frequency (times per week) 2    Duration Progress to 30 minutes of continuous aerobic without signs/symptoms of physical distress  Intensity   THRR 40-80% of Max Heartrate 59-118    Ratings of Perceived Exertion 11-13    Perceived Dyspnea 0-4      Progression   Progression Continue to progress workloads to maintain intensity without signs/symptoms of physical distress.      Resistance Training   Training Prescription Yes    Reps 10-15             Perform Capillary Blood Glucose checks as needed.  Exercise Prescription Changes:   Exercise Comments:   Exercise Goals and Review:   Exercise Goals     Row Name 09/13/22 1011             Exercise Goals   Increase Physical Activity Yes       Intervention Provide advice, education, support and counseling about physical activity/exercise needs.;Develop an individualized exercise prescription for aerobic and resistive training based on initial evaluation findings, risk stratification, comorbidities and participant's personal goals.       Expected Outcomes Long Term: Add in home exercise to make exercise part of routine and to increase amount of physical activity.;Short Term: Attend rehab on a regular basis to increase amount of physical activity.;Long Term: Exercising regularly at least 3-5 days a week.       Increase Strength and Stamina Yes       Intervention Provide advice, education, support and counseling about physical activity/exercise needs.;Develop an individualized exercise prescription for aerobic and resistive training based on initial evaluation findings, risk stratification, comorbidities and participant's personal goals.       Expected Outcomes Short Term: Increase workloads from initial exercise prescription for resistance, speed, and METs.;Short Term: Perform resistance training exercises  routinely during rehab and add in resistance training at home;Long Term: Improve cardiorespiratory fitness, muscular endurance and strength as measured by increased METs and functional capacity ( )       Able to understand and use rate of perceived exertion (RPE) scale Yes       Intervention Provide education and explanation on how to use RPE scale       Expected Outcomes Short Term: Able to use RPE daily in rehab to express subjective intensity level;Long Term:  Able to use RPE to guide intensity level when exercising independently       Able to understand and use Dyspnea scale Yes       Intervention Provide education and explanation on how to use Dyspnea scale       Expected Outcomes Short Term: Able to use Dyspnea scale daily in rehab to express subjective sense of shortness of breath during exertion;Long Term: Able to use Dyspnea scale to guide intensity level when exercising independently       Knowledge and understanding of Target Heart Rate Range (THRR) Yes       Intervention Provide education and explanation of THRR including how the numbers were predicted and where they are located for reference       Expected Outcomes Short Term: Able to state/look up THRR;Short Term: Able to use daily as guideline for intensity in rehab;Long Term: Able to use THRR to govern intensity when exercising independently       Understanding of Exercise Prescription Yes       Intervention Provide education, explanation, and written materials on patient's individual exercise prescription       Expected Outcomes Short Term: Able to explain program exercise prescription;Long Term: Able to explain home exercise prescription to exercise independently  Exercise Goals Re-Evaluation :   Discharge Exercise Prescription (Final Exercise Prescription Changes):   Nutrition:  Target Goals: Understanding of nutrition guidelines, daily intake of sodium 1500mg , cholesterol 200mg , calories 30% from fat  and 7% or less from saturated fats, daily to have 5 or more servings of fruits and vegetables.  Biometrics:  Pre Biometrics - 09/13/22 0955       Pre Biometrics   Grip Strength 40 kg              Nutrition Therapy Plan and Nutrition Goals:   Nutrition Assessments:  MEDIFICTS Score Key: ?70 Need to make dietary changes  40-70 Heart Healthy Diet ? 40 Therapeutic Level Cholesterol Diet   Picture Your Plate Scores: <56 Unhealthy dietary pattern with much room for improvement. 41-50 Dietary pattern unlikely to meet recommendations for good health and room for improvement. 51-60 More healthful dietary pattern, with some room for improvement.  >60 Healthy dietary pattern, although there may be some specific behaviors that could be improved.    Nutrition Goals Re-Evaluation:   Nutrition Goals Discharge (Final Nutrition Goals Re-Evaluation):   Psychosocial: Target Goals: Acknowledge presence or absence of significant depression and/or stress, maximize coping skills, provide positive support system. Participant is able to verbalize types and ability to use techniques and skills needed for reducing stress and depression.  Initial Review & Psychosocial Screening:  Initial Psych Review & Screening - 09/13/22 1014       Initial Review   Current issues with Current Stress Concerns    Source of Stress Concerns Family;Financial;Unable to participate in former interests or hobbies    Comments Pt is currently concerned about not having a relationship with his 2 children, feels ashamed of smoking, and has little interest in doing things, grumpy about [facility] care, [facility], and getting prescriptions in a timely manner      Family Dynamics   Good Support System? Yes    Comments Friends from living facility      Barriers   Psychosocial barriers to participate in program The patient should benefit from training in stress management and relaxation.;Psychosocial barriers identified (see  note)      Screening Interventions   Interventions Encouraged to exercise;Provide feedback about the scores to participant;To provide support and resources with identified psychosocial needs    Expected Outcomes Long Term Goal: Stressors or current issues are controlled or eliminated.;Short Term goal: Identification and review with participant of any Quality of Life or Depression concerns found by scoring the questionnaire.;Long Term goal: The participant improves quality of Life and PHQ9 Scores as seen by post scores and/or verbalization of changes;Short Term goal: Utilizing psychosocial counselor, staff and physician to assist with identification of specific Stressors or current issues interfering with healing process. Setting desired goal for each stressor or current issue identified.   Pt scored high on PHQ2/9. Pt denies current depression although speaking w/patient there are possible signs. Pt currently declines to speak to a therapist & making an appt for follow up with his PCP. We will continue to assess and monitor his needs.            Quality of Life Scores:  Scores of 19 and below usually indicate a poorer quality of life in these areas.  A difference of  2-3 points is a clinically meaningful difference.  A difference of 2-3 points in the total score of the Quality of Life Index has been associated with significant improvement in overall quality of life, self-image, physical symptoms,  and general health in studies assessing change in quality of life.  PHQ-9: Review Flowsheet       09/13/2022 04/24/2018 07/02/2017 03/13/2017  Depression screen PHQ 2/9  Decreased Interest 1 0 1 0 0  Down, Depressed, Hopeless 1 0 1 0 0  PHQ - 2 Score 2 0 2 0 0  Altered sleeping 1 0 0 - -  Tired, decreased energy 2 3 3  - -  Change in appetite 0 0 0 - -  Feeling bad or failure about yourself  0 2 1 - -  Trouble concentrating 2 1 1  - -  Moving slowly or fidgety/restless 0 0 0 - -  Suicidal thoughts 0  0 0 - -  PHQ-9 Score 7 6 7  - -  Difficult doing work/chores Very difficult Very difficult - -   Interpretation of Total Score  Total Score Depression Severity:  1-4 = Minimal depression, 5-9 = Mild depression, 10-14 = Moderate depression, 15-19 = Moderately severe depression, 20-27 = Severe depression   Psychosocial Evaluation and Intervention:  Psychosocial Evaluation - 09/13/22 1019       Psychosocial Evaluation & Interventions   Interventions Stress management education;Encouraged to exercise with the program and follow exercise prescription    Comments Pt is currently concerned about not having a relationship with his 2 children, feels ashamed of still smoking, has low self worth, little interest in doing things, and  has a grumpy disposition due to [facility], [facility], and getting prescriptions in a timely manner    Expected Outcomes Pt will report and employ positive and healthy techniques for stress management. Currently declines referral to his PCP and a therapist    Continue Psychosocial Services  Follow up required by staff   We will continue to monitor and assess            Psychosocial Re-Evaluation:   Psychosocial Discharge (Final Psychosocial Re-Evaluation):   Education: Education Goals: Education classes will be provided on a weekly basis, covering required topics. Participant will state understanding/return demonstration of topics presented.  Learning Barriers/Preferences:  Learning Barriers/Preferences - 09/13/22 1153       Learning Barriers/Preferences   Learning Barriers Sight;Hearing    Learning Preferences Group Instruction;Individual Instruction;Verbal Instruction;Written Material;Skilled Demonstration             Education Topics: Introduction to Pulmonary Rehab Group instruction provided by PowerPoint, verbal discussion, and written material to support subject matter. Instructor reviews what Pulmonary Rehab is, the purpose of the program, and  how patients are referred.     Know Your Numbers Group instruction that is supported by a PowerPoint presentation. Instructor discusses importance of knowing and understanding resting, exercise, and post-exercise oxygen saturation, heart rate, and blood pressure. Oxygen saturation, heart rate, blood pressure, rating of perceived exertion, and dyspnea are reviewed along with a normal range for these values.    Exercise for the Pulmonary Patient Group instruction that is supported by a PowerPoint presentation. Instructor discusses benefits of exercise, core components of exercise, frequency, duration, and intensity of an exercise routine, importance of utilizing pulse oximetry during exercise, safety while exercising, and options of places to exercise outside of rehab.  Flowsheet Row PULMONARY REHAB CHRONIC OBSTRUCTIVE PULMONARY DISEASE [facility]  Date 07/30/18  Educator Kirt BoysMolly  Instruction Review Code 1- Verbalizes Understanding        MET Level  Group instruction provided by PowerPoint, verbal discussion, and written material  to support subject matter. Instructor reviews what METs are and how to increase METs.    Pulmonary Medications Verbally interactive group education provided by instructor with focus on inhaled medications and proper administration. Flowsheet Row PULMONARY REHAB CHRONIC OBSTRUCTIVE PULMONARY DISEASE from 08/06/2018 in [facility]  Date 06/30/18  Educator Victorino Dike  Instruction Review Code 1- Verbalizes Understanding       Anatomy and Physiology of the Respiratory System Group instruction provided by PowerPoint, verbal discussion, and written material to support subject matter. Instructor reviews respiratory cycle and anatomical components of the respiratory system and their functions. Instructor also reviews differences in  obstructive and restrictive respiratory diseases with examples of each.  Flowsheet Row PULMONARY REHAB CHRONIC OBSTRUCTIVE PULMONARY DISEASE from 08/06/2018 in [facility]  Date 07/09/18  Educator RN  Instruction Review Code 1- Verbalizes Understanding       Oxygen Safety Group instruction provided by PowerPoint, verbal discussion, and written material to support subject matter. There is an overview of "What is Oxygen" and "Why do we need it".  Instructor also reviews how to create a safe environment for oxygen use, the importance of using oxygen as prescribed, and the risks of noncompliance. There is a brief discussion on traveling with oxygen and resources the patient may utilize. Flowsheet Row PULMONARY REHAB CHRONIC OBSTRUCTIVE PULMONARY DISEASE from 08/06/2018 in [facility]  Date 07/16/18  Educator Kirt Boys  Instruction Review Code 1- Verbalizes Understanding       Oxygen Use Group instruction provided by PowerPoint, verbal discussion, and written material to discuss how supplemental oxygen is prescribed and different types of oxygen supply systems. Resources for more information are provided.    Breathing Techniques Group instruction that is supported by demonstration and informational handouts. Instructor discusses the benefits of pursed lip and diaphragmatic breathing and detailed demonstration on how to perform both.     Risk Factor Reduction Group instruction that is supported by a PowerPoint presentation. Instructor discusses the definition of a risk factor, different risk factors for pulmonary disease, and how the heart and lungs work together.   MD Day A group question and answer session with a medical doctor that allows participants to ask questions that relate to their pulmonary disease state.   Nutrition for the Pulmonary Patient Group instruction  provided by PowerPoint slides, verbal discussion, and written materials to support subject matter. The instructor gives an explanation and review of healthy diet recommendations, which includes a discussion on weight management, recommendations for fruit and vegetable consumption, as well as protein, fluid, caffeine, fiber, sodium, sugar, and alcohol. Tips for eating when patients are short of breath are discussed. Flowsheet Row PULMONARY REHAB CHRONIC OBSTRUCTIVE PULMONARY DISEASE from 08/06/2018 in [facility]  Date 06/18/18  Educator Danne [place]  Instruction Review Code 1- Verbalizes Understanding        Other Education Group or individual verbal, written, or video instructions that support the educational goals of the pulmonary rehab program. Flowsheet Row PULMONARY REHAB CHRONIC OBSTRUCTIVE PULMONARY DISEASE from 08/06/2018 in [facility]  Date 06/25/18  Vertell Novak to hand weights]  Educator [place]  Instruction Review Code 1- Verbalizes Understanding        Knowledge Questionnaire Score:  Knowledge Questionnaire Score - 09/13/22 1153  Knowledge Questionnaire Score   Pre Score 12/18             Core Components/Risk Factors/Patient Goals at Admission:  Personal Goals and Risk Factors at Admission - 09/13/22 1019       Core Components/Risk Factors/Patient Goals on Admission    Weight Management Yes;Weight Loss    Admit Weight 188 lb (85.3 kg)    Goal Weight: Short Term 175 lb (79.4 kg)    Expected Outcomes Short Term: Continue to assess and modify interventions until short term weight is achieved;Long Term: Adherence to nutrition and physical activity/exercise program aimed toward attainment of established weight goal;Weight Maintenance: Understanding of the daily nutrition guidelines, which includes 25-35% calories from fat, 7% or less cal from saturated fats,  less than 200mg  cholesterol, less than 1.5gm of sodium, & 5 or more servings of fruits and vegetables daily;Weight Loss: Understanding of general recommendations for a balanced deficit meal plan, which promotes 1-2 lb weight loss per week and includes a negative energy balance of [phone] kcal/d;Understanding recommendations for meals to include 15-35% energy as protein, 25-35% energy from fat, 35-60% energy from carbohydrates, less than 200mg  of dietary cholesterol, 20-35 gm of total fiber daily;Understanding of distribution of calorie intake throughout the day with the consumption of 4-5 meals/snacks    Tobacco Cessation Yes    Number of packs per day 1-3 cigs/day    Intervention Assist the participant in steps to quit. Provide individualized education and counseling about committing to Tobacco Cessation, relapse prevention, and pharmacological support that can be provided by physician.;Education officer, environmental, assist with locating and accessing local/national Quit Smoking programs, and support quit date choice.    Expected Outcomes Short Term: Will demonstrate readiness to quit, by selecting a quit date.;Long Term: Complete abstinence from all tobacco products for at least 12 months from quit date.;Short Term: Will quit all tobacco product use, adhering to prevention of relapse plan.    Improve shortness of breath with ADL's Yes    Intervention Provide education, individualized exercise plan and daily activity instruction to help decrease symptoms of SOB with activities of daily living.    Expected Outcomes Short Term: Improve cardiorespiratory fitness to achieve a reduction of symptoms when performing ADLs;Long Term: Be able to perform more ADLs without symptoms or delay the onset of symptoms    Increase knowledge of respiratory medications and ability to use respiratory devices properly  Yes    Intervention Provide education and demonstration as needed of appropriate use of medications, inhalers,  and oxygen therapy.    Expected Outcomes Short Term: Achieves understanding of medications use. Understands that oxygen is a medication prescribed by physician. Demonstrates appropriate use of inhaler and oxygen therapy.;Long Term: Maintain appropriate use of medications, inhalers, and oxygen therapy.    Stress Yes    Intervention Offer individual and/or small group education and counseling on adjustment to heart disease, stress management and health-related lifestyle change. Teach and support self-help strategies.;Refer participants experiencing significant psychosocial distress to appropriate mental health specialists for further evaluation and treatment. When possible, include family members and significant others in education/counseling sessions.    Expected Outcomes Short Term: Participant demonstrates changes in health-related behavior, relaxation and other stress management skills, ability to obtain effective social support, and compliance with psychotropic medications if prescribed.;Long Term: Emotional wellbeing is indicated by absence of clinically significant psychosocial distress or social isolation.             Core Components/Risk Factors/Patient Goals Review:  Core Components/Risk Factors/Patient Goals at Discharge (Final Review):    ITP Comments:   Comments: Dr. Mechele Collin is Medical Director for Pulmonary Rehab at [facility].

## 2022-09-13 NOTE — Telephone Encounter (Signed)
PT is calling back. He states he mentioned the issue with using alt inhalers to Dr. Celine Mans on his last appt. He has tried other inhalers in the past and he can not use them nor can he abide the "powder"  His PCP finally sent in one that worked for him. This was about the time Covid started.   Pls check back in w/PT @ [phone]

## 2022-09-16 ENCOUNTER — Telehealth: Payer: Self-pay | Admitting: Internal Medicine

## 2022-09-16 MED ORDER — ALBUTEROL SULFATE (2.5 MG/3ML) 0.083% IN NEBU: 2.5000 mg | INHALATION_SOLUTION | Freq: Four times a day (QID) | RESPIRATORY_TRACT | 11 refills | Status: DC | PRN

## 2022-09-16 NOTE — Telephone Encounter (Signed)
Pt called back again asking about rxs for symbicort and spiriva  I advised that we will let him know once the provider responds  He asked for refill on albuterol neb solution and I have sent this to his preferred pharm   Dr Celine Mans- please advise if okay to change from [facility] back to symbicort and spiriva, thanks.

## 2022-09-16 NOTE — Telephone Encounter (Signed)
I just received prior authorization form from the [place] for breztri and filled it out for approval. Nebulizer medication was sent to the [place] in [place] at last visit.

## 2022-09-16 NOTE — Telephone Encounter (Signed)
Pt called the office stating that he was placed on Breztri. States that he wants to back to prior meds that he was taking before being put on the [place] as he said that he is breaking out in a rash.  Pt said he was on Symbicort and Spiriva prior to being put on the [place].  Pharmacy that the meds will need to be sent to is [facility].  Dr. Celine Mans, please advise.

## 2022-09-16 NOTE — Telephone Encounter (Signed)
Patient needs Albuterol solution. Also checking on  message for inhalers. Pharmacy is [facility] [place]. Patient phone number is [phone].

## 2022-09-17 MED ORDER — BUDESONIDE-FORMOTEROL FUMARATE 160-4.5 MCG/ACT IN AERO: 2.0000 | INHALATION_SPRAY | Freq: Two times a day (BID) | RESPIRATORY_TRACT | 1 refills | Status: DC

## 2022-09-17 MED ORDER — SPIRIVA RESPIMAT 2.5 MCG/ACT IN AERS: 1.0000 | INHALATION_SPRAY | Freq: Two times a day (BID) | RESPIRATORY_TRACT | 2 refills | Status: DC

## 2022-09-17 NOTE — Telephone Encounter (Signed)
Called and spoke with patient. He verbalized understanding. Symbicort and Spiriva have been sent to the [facility] pharmacy as requested.   Nothing further needed at time of call.

## 2022-09-17 NOTE — Telephone Encounter (Signed)
Patient checking on medication for Symbicort and Spiriva. Breztri causes him to get rash. Patient phone number is [phone].

## 2022-09-17 NOTE — Telephone Encounter (Signed)
See other encounter. Will close this encounter.  

## 2022-09-17 NOTE — Telephone Encounter (Signed)
Dr. Celine Mans, are you ok with him going back to the Symbicort and Spiriva instead of using Breztri?

## 2022-09-17 NOTE — Telephone Encounter (Signed)
That's fine. Ok to order to [place] and refill.

## 2022-09-17 NOTE — Telephone Encounter (Signed)
Rerouting due to no reply.

## 2022-09-18 ENCOUNTER — Telehealth (HOSPITAL_COMMUNITY): Payer: Self-pay

## 2022-09-18 NOTE — Progress Notes (Signed)
Pulmonary Individual Treatment Plan  Patient Details  Name: Steve Riley MRN: [id number] Date of Birth: [birth year] Referring Provider:   [name] Pulmonary Rehab Walk Test from 09/13/2022 in [facility]  [facility]erring [name] coverage)       Initial Encounter Date:  Flowsheet Row Pulmonary Rehab Walk Test from 09/13/2022 in [facility]  Date 09/13/22       Visit Diagnosis: OSA (obstructive sleep apnea)  Patient's Home Medications on Admission:   Current Outpatient Medications:    albuterol (PROVENTIL) (2.5 MG/3ML) 0.083% nebulizer solution, Take 3 mLs (2.5 mg total) by nebulization every 6 (six) hours as needed for wheezing or shortness of breath., Disp: 75 mL, Rfl: 5   APPLE CIDER VINEGAR PO, Take by mouth in the morning and at bedtime. , Disp: , Rfl:    Ascorbic Acid (VITAMIN C WITH ROSE HIPS) 1000 MG tablet, Take 1,000 mg by mouth daily., Disp: , Rfl:    Calcium Carbonate-Vitamin D 600-400 MG-UNIT chew tablet, Chew 1 tablet by mouth 2 (two) times daily. , Disp: , Rfl:    Cholecalciferol (DIALYVITE VITAMIN D 5000) 125 MCG (5000 UT) capsule, Take 5,000 Units by mouth daily., Disp: , Rfl:    Cyanocobalamin (B-12) 5000 MCG CAPS, Take 5,000 mcg by mouth daily., Disp: , Rfl:    Flaxseed, Linseed, (FLAX PO), Take 2,600 mg by mouth daily., Disp: , Rfl:    folic acid (FOLVITE) 1 MG tablet, Take 1 tablet by mouth daily., Disp: , Rfl:    Methylsulfonylmethane (MSM) 1000 MG CAPS, Take 1,000 mg by mouth 2 (two) times daily., Disp: , Rfl:    OVER THE COUNTER MEDICATION, Take 4,000 Units by mouth in the morning and at bedtime. Serrapeptase, Disp: , Rfl:    Probiotic CAPS, Take 1 capsule by mouth 2 (two) times daily with a meal., Disp: , Rfl:    selenium 200 MCG TABS tablet, Take 200 mcg by mouth daily., Disp: , Rfl:    silver sulfADIAZINE (SILVADENE) 1 % cream,  Apply 1 application  topically in the morning, at noon, and at bedtime., Disp: , Rfl:    albuterol (PROVENTIL) (2.5 MG/3ML) 0.083% nebulizer solution, Take 3 mLs (2.5 mg total) by nebulization every 6 (six) hours as needed for wheezing or shortness of breath., Disp: 120 mL, Rfl: 11   albuterol (VENTOLIN HFA) 108 (90 Base) MCG/ACT inhaler, Inhale 1-2 puffs into the lungs every 6 (six) hours as needed for wheezing. (Patient not taking: Reported on 09/13/2022), Disp: 18 g, Rfl: 4   aspirin 81 MG chewable tablet, Chew 1 tablet (81 mg total) by mouth 2 (two) times daily. (Patient not taking: Reported on 02/14/2022), Disp: 60 tablet, Rfl: 0   budesonide-formoterol (SYMBICORT) 160-4.5 MCG/ACT inhaler, Inhale 2 puffs into the lungs 2 (two) times daily., Disp: 30.6 each, Rfl: 1   methocarbamol (ROBAXIN) 500 MG tablet, Take 1 tablet (500 mg total) by mouth every 6 (six) hours as needed for muscle spasms. (Patient not taking: Reported on 02/14/2022), Disp: 60 tablet, Rfl: 0   ondansetron (ZOFRAN) 4 MG tablet, Take 1 tablet (4 mg total) by mouth every 8 (eight) hours as needed for up to 20 doses for nausea or vomiting. (Patient not taking: Reported on 02/14/2022), Disp: 20 tablet, Rfl: 0   oxyCODONE (OXY IR/ROXICODONE) 5 MG immediate release tablet, Take 1-2 tablets (5-10 mg total) by mouth every 4 (four)  hours as needed for moderate pain (pain score 4-6). (Patient not taking: Reported on 02/14/2022), Disp: 30 tablet, Rfl: 0   predniSONE (DELTASONE) 20 MG tablet, Take 2 tablets (40 mg total) by mouth daily with breakfast. (Patient not taking: Reported on 09/05/2022), Disp: 10 tablet, Rfl: 0   Tiotropium Bromide Monohydrate (SPIRIVA RESPIMAT) 2.5 MCG/ACT AERS, Inhale 1 puff into the lungs in the morning and at bedtime., Disp: 12 g, Rfl: 2   traZODone (DESYREL) 100 MG tablet, Take 100 mg by mouth at bedtime. (Patient not taking: Reported on 02/14/2022), Disp: , Rfl:    Turmeric Curcumin 500 MG CAPS, Take 500 mg by mouth in the  morning and at bedtime.  (Patient not taking: Reported on 09/05/2022), Disp: , Rfl:    Zinc 50 MG TABS, Take 50 mg by mouth daily. (Patient not taking: Reported on 02/14/2022), Disp: , Rfl:   Past Medical History: Past Medical History:  Diagnosis Date   COPD (chronic obstructive pulmonary disease)    Pneumonia    Prostate cancer     Tobacco Use: Social History   Tobacco Use  Smoking Status Every Day   Packs/day: 0.25   Years: 54.00   Additional pack years: 0.00   Total pack years: 13.50   Types: Cigarettes   Last attempt to quit: 07/11/2016   Years since quitting: 6.1  Smokeless Tobacco Never  Tobacco Comments   occassionally 1-3 cigarettes per day .  Updated 09/05/2022.    Labs: Review Flowsheet       Latest Ref Rng & Units 03/06/2018 03/07/2018  Labs for ITP Cardiac and Pulmonary Rehab  Hemoglobin A1c 4.8 - 5.6 % - 6.5   Bicarbonate 20.0 - 28.0 mmol/L 22.9  -  TCO2 22 - 32 mmol/L 24  -  Acid-base deficit 0.0 - 2.0 mmol/L 1.0  -  O2 Saturation % 81.0  -    Capillary Blood Glucose: Lab Results  Component Value Date   GLUCAP 92 08/18/2019   GLUCAP 86 03/09/2018   GLUCAP 104 (H) 03/09/2018   GLUCAP 139 (H) 03/08/2018   GLUCAP 225 (H) 03/07/2018     Pulmonary Assessment Scores:  Pulmonary Assessment Scores     Row Name 09/13/22 1037         ADL UCSD   SOB Score total 58       CAT Score   CAT Score 19       mMRC Score   mMRC Score 4             UCSD: Self-administered rating of dyspnea associated with activities of daily living (ADLs) 6-point scale (0 = "not at all" to 5 = "maximal or unable to do because of breathlessness")  Scoring Scores range from 0 to 120.  Minimally important difference is 5 units  CAT: CAT can identify the health impairment of COPD patients and is better correlated with disease progression.  CAT has a scoring range of zero to 40. The CAT score is classified into four groups of low (less than 10), medium (10 - 20), high  (21-30) and very high (31-40) based on the impact level of disease on health status. A CAT score over 10 suggests significant symptoms.  A worsening CAT score could be explained by an exacerbation, poor medication adherence, poor inhaler technique, or progression of COPD or comorbid conditions.  CAT MCID is 2 points  mMRC: mMRC (Modified Medical Research Council) Dyspnea Scale is used to assess the degree of baseline functional disability in  patients of respiratory disease due to dyspnea. No minimal important difference is established. A decrease in score of 1 point or greater is considered a positive change.   Pulmonary Function Assessment:  Pulmonary Function Assessment - 09/13/22 1013       Breath   Bilateral Breath Sounds Clear   Expiratory wheezes with exertion   Shortness of Breath Yes;Limiting activity;Panic with Shortness of Breath             Exercise Target Goals: Exercise Program Goal: Individual exercise prescription set using results from initial 6 min walk test and THRR while considering  patient's activity barriers and safety.   Exercise Prescription Goal: Initial exercise prescription builds to 30-45 minutes a day of aerobic activity, 2-3 days per week.  Home exercise guidelines will be given to patient during program as part of exercise prescription that the participant will acknowledge.  Activity Barriers & Risk Stratification:  Activity Barriers & Cardiac Risk Stratification - 09/13/22 1008       Activity Barriers & Cardiac Risk Stratification   Activity Barriers Left Hip Replacement;Right Hip Replacement;Shortness of Breath;Muscular Weakness;Deconditioning;Balance Concerns             6 Minute Walk:  6 Minute Walk     Row Name 09/13/22 1147         6 Minute Walk   Phase Initial     Distance 1124 feet     Walk Time 6 minutes     # of Rest Breaks 1  5:07-6:00     MPH 2.13     METS 2.5     RPE 17     Perceived Dyspnea  3     VO2 Peak 8.76      Symptoms Yes (comment)     Comments Pt had to stop and rest at 5 min with accessory muscle use and sats dropping to 79%. O2 was palced @ 2L with sats recovering to 94% after O2 placed.     Resting HR 69 bpm     Resting BP 140/68     Resting Oxygen Saturation  94 %     Exercise Oxygen Saturation  during 6 min walk 79 %     Max Ex. HR 96 bpm     Max Ex. BP 154/70     2 Minute Post BP 138/70       Interval HR   1 Minute HR 79     2 Minute HR 84     3 Minute HR 88     4 Minute HR 96     5 Minute HR 86     6 Minute HR 89     2 Minute Post HR 77     Interval Heart Rate? Yes       Interval Oxygen   Baseline Oxygen Saturation % 94 %     1 Minute Oxygen Saturation % 94 %     1 Minute Liters of Oxygen 0 L     2 Minute Oxygen Saturation % 95 %     2 Minute Liters of Oxygen 0 L     3 Minute Oxygen Saturation % 92 %     3 Minute Liters of Oxygen 0 L     4 Minute Oxygen Saturation % 95 %     4 Minute Liters of Oxygen 0 L     5 Minute Oxygen Saturation % 79 %  placed pt on 2L     5 Minute Liters  of Oxygen 0 L     6 Minute Oxygen Saturation % 94 %     6 Minute Liters of Oxygen 2 L     2 Minute Post Oxygen Saturation % 96 %     2 Minute Post Liters of Oxygen 0 L              Oxygen Initial Assessment:  Oxygen Initial Assessment - 09/13/22 1012       Home Oxygen   Home Oxygen Device None    Sleep Oxygen Prescription None    Home Exercise Oxygen Prescription None    Home Resting Oxygen Prescription None      Initial 6 min Walk   Oxygen Used Continuous    Liters per minute 2      Program Oxygen Prescription   Program Oxygen Prescription Continuous    Liters per minute 2    Comments During walk test on RA, pt desaturated down to 79%. Placed on 2L [place].      Intervention   Short Term Goals To learn and exhibit compliance with exercise, home and travel O2 prescription;To learn and understand importance of maintaining oxygen saturations>88%;To learn and demonstrate proper use of  respiratory medications;To learn and understand importance of monitoring SPO2 with pulse oximeter and demonstrate accurate use of the pulse oximeter.;To learn and demonstrate proper pursed lip breathing techniques or other breathing techniques.     Long  Term Goals Exhibits compliance with exercise, home  and travel O2 prescription;Maintenance of O2 saturations>88%;Compliance with respiratory medication;Verbalizes importance of monitoring SPO2 with pulse oximeter and return demonstration;Exhibits proper breathing techniques, such as pursed lip breathing or other method taught during program session;Demonstrates proper use of MDI's             Oxygen Re-Evaluation:  Oxygen Re-Evaluation     Row Name 09/13/22 1012 09/16/22 0908           Program Oxygen Prescription   Program Oxygen Prescription -- Continuous      Liters per minute -- 2        Home Oxygen   Home Oxygen Device -- None      Sleep Oxygen Prescription -- None      Home Exercise Oxygen Prescription -- None      Home Resting Oxygen Prescription -- None        Goals/Expected Outcomes   Short Term Goals -- To learn and exhibit compliance with exercise, home and travel O2 prescription;To learn and understand importance of maintaining oxygen saturations>88%;To learn and demonstrate proper use of respiratory medications;To learn and understand importance of monitoring SPO2 with pulse oximeter and demonstrate accurate use of the pulse oximeter.;To learn and demonstrate proper pursed lip breathing techniques or other breathing techniques.       Long  Term Goals -- Exhibits compliance with exercise, home  and travel O2 prescription;Maintenance of O2 saturations>88%;Compliance with respiratory medication;Verbalizes importance of monitoring SPO2 with pulse oximeter and return demonstration;Exhibits proper breathing techniques, such as pursed lip breathing or other method taught during program session;Demonstrates proper use of MDI's       Comments [facility] to [facility] O2      Goals/Expected Outcomes Compliance with home oxygen when obtained. Compliance and understanding of oxygen saturation monitoring and breathing techniques to decrease shortness of breath.               Oxygen Discharge (Final Oxygen Re-Evaluation):  Oxygen Re-Evaluation - 09/16/22  0908       Program Oxygen Prescription   Program Oxygen Prescription Continuous    Liters per minute 2      Home Oxygen   Home Oxygen Device None    Sleep Oxygen Prescription None    Home Exercise Oxygen Prescription None    Home Resting Oxygen Prescription None      Goals/Expected Outcomes   Short Term Goals To learn and exhibit compliance with exercise, home and travel O2 prescription;To learn and understand importance of maintaining oxygen saturations>88%;To learn and demonstrate proper use of respiratory medications;To learn and understand importance of monitoring SPO2 with pulse oximeter and demonstrate accurate use of the pulse oximeter.;To learn and demonstrate proper pursed lip breathing techniques or other breathing techniques.     Long  Term Goals Exhibits compliance with exercise, home  and travel O2 prescription;Maintenance of O2 saturations>88%;Compliance with respiratory medication;Verbalizes importance of monitoring SPO2 with pulse oximeter and return demonstration;Exhibits proper breathing techniques, such as pursed lip breathing or other method taught during program session;Demonstrates proper use of MDI's    Comments [facility] to [facility]    Goals/Expected Outcomes Compliance and understanding of oxygen saturation monitoring and breathing techniques to decrease shortness of breath.             Initial Exercise Prescription:  Initial Exercise Prescription - 09/13/22 1100       Date of Initial Exercise RX and Referring Provider   Date 09/13/22    [facility]erring [name]  coverage)    Expected Discharge Date 12/10/22      Oxygen   Oxygen Intermittent    Liters 2    Maintain Oxygen Saturation 88% or higher      NuStep   Level 1    SPM 60    Minutes 30      Prescription Details   Frequency (times per week) 2    Duration Progress to 30 minutes of continuous aerobic without signs/symptoms of physical distress      Intensity   THRR 40-80% of Max Heartrate 59-118    Ratings of Perceived Exertion 11-13    Perceived Dyspnea 0-4      Progression   Progression Continue to progress workloads to maintain intensity without signs/symptoms of physical distress.      Resistance Training   Training Prescription Yes    Reps 10-15             Perform Capillary Blood Glucose checks as needed.  Exercise Prescription Changes:   Exercise Comments:   Exercise Goals and Review:   Exercise Goals     Row Name 09/13/22 1011 09/16/22 0906           Exercise Goals   Increase Physical Activity Yes Yes      Intervention Provide advice, education, support and counseling about physical activity/exercise needs.;Develop an individualized exercise prescription for aerobic and resistive training based on initial evaluation findings, risk stratification, comorbidities and participant's personal goals. Provide advice, education, support and counseling about physical activity/exercise needs.;Develop an individualized exercise prescription for aerobic and resistive training based on initial evaluation findings, risk stratification, comorbidities and participant's personal goals.      Expected Outcomes Long Term: Add in home exercise to make exercise part of routine and to increase amount of physical activity.;Short Term: Attend rehab on a regular basis to increase amount of physical activity.;Long Term: Exercising regularly at least 3-5 days a week. Long Term: Add in home exercise to make exercise  part of routine and to increase amount of physical activity.;Short Term:  Attend rehab on a regular basis to increase amount of physical activity.;Long Term: Exercising regularly at least 3-5 days a week.      Increase Strength and Stamina Yes Yes      Intervention Provide advice, education, support and counseling about physical activity/exercise needs.;Develop an individualized exercise prescription for aerobic and resistive training based on initial evaluation findings, risk stratification, comorbidities and participant's personal goals. Provide advice, education, support and counseling about physical activity/exercise needs.;Develop an individualized exercise prescription for aerobic and resistive training based on initial evaluation findings, risk stratification, comorbidities and participant's personal goals.      Expected Outcomes Short Term: Increase workloads from initial exercise prescription for resistance, speed, and METs.;Short Term: Perform resistance training exercises routinely during rehab and add in resistance training at home;Long Term: Improve cardiorespiratory fitness, muscular endurance and strength as measured by increased METs and functional capacity ( ) Short Term: Increase workloads from initial exercise prescription for resistance, speed, and METs.;Short Term: Perform resistance training exercises routinely during rehab and add in resistance training at home;Long Term: Improve cardiorespiratory fitness, muscular endurance and strength as measured by increased METs and functional capacity ( )      Able to understand and use rate of perceived exertion (RPE) scale Yes Yes      Intervention Provide education and explanation on how to use RPE scale Provide education and explanation on how to use RPE scale      Expected Outcomes Short Term: Able to use RPE daily in rehab to express subjective intensity level;Long Term:  Able to use RPE to guide intensity level when exercising independently Short Term: Able to use RPE daily in rehab to express subjective  intensity level;Long Term:  Able to use RPE to guide intensity level when exercising independently      Able to understand and use Dyspnea scale Yes Yes      Intervention Provide education and explanation on how to use Dyspnea scale Provide education and explanation on how to use Dyspnea scale      Expected Outcomes Short Term: Able to use Dyspnea scale daily in rehab to express subjective sense of shortness of breath during exertion;Long Term: Able to use Dyspnea scale to guide intensity level when exercising independently Short Term: Able to use Dyspnea scale daily in rehab to express subjective sense of shortness of breath during exertion;Long Term: Able to use Dyspnea scale to guide intensity level when exercising independently      Knowledge and understanding of Target Heart Rate Range (THRR) Yes Yes      Intervention Provide education and explanation of THRR including how the numbers were predicted and where they are located for reference Provide education and explanation of THRR including how the numbers were predicted and where they are located for reference      Expected Outcomes Short Term: Able to state/look up THRR;Short Term: Able to use daily as guideline for intensity in rehab;Long Term: Able to use THRR to govern intensity when exercising independently Short Term: Able to state/look up THRR;Short Term: Able to use daily as guideline for intensity in rehab;Long Term: Able to use THRR to govern intensity when exercising independently      Understanding of Exercise Prescription Yes Yes      Intervention Provide education, explanation, and written materials on patient's individual exercise prescription Provide education, explanation, and written materials on patient's individual exercise prescription      Expected Outcomes Short  Term: Able to explain program exercise prescription;Long Term: Able to explain home exercise prescription to exercise independently Short Term: Able to explain program  exercise prescription;Long Term: Able to explain home exercise prescription to exercise independently               Exercise Goals Re-Evaluation :  Exercise Goals Re-Evaluation     Row Name 09/16/22 0906             Exercise Goal Re-Evaluation   Exercise Goals Review Increase Physical Activity;Able to understand and use Dyspnea scale;Understanding of Exercise Prescription;Increase Strength and Stamina;Knowledge and understanding of Target Heart Rate Range (THRR);Able to understand and use rate of perceived exertion (RPE) scale       Comments Pt is scheduled to start exercise this week. Will continue to monitor and progress as able.       Expected Outcomes Through exercise at rehab and home, the patient will decrease shortness of breath with daily activities and feel confident in carrying out an exercise regimen at home.                Discharge Exercise Prescription (Final Exercise Prescription Changes):   Nutrition:  Target Goals: Understanding of nutrition guidelines, daily intake of sodium 1500mg , cholesterol 200mg , calories 30% from fat and 7% or less from saturated fats, daily to have 5 or more servings of fruits and vegetables.  Biometrics:  Pre Biometrics - 09/13/22 0955       Pre Biometrics   Grip Strength 40 kg              Nutrition Therapy Plan and Nutrition Goals:   Nutrition Assessments:  MEDIFICTS Score Key: ?70 Need to make dietary changes  40-70 Heart Healthy Diet ? 40 Therapeutic Level Cholesterol Diet   Picture Your Plate Scores: <40 Unhealthy dietary pattern with much room for improvement. 41-50 Dietary pattern unlikely to meet recommendations for good health and room for improvement. 51-60 More healthful dietary pattern, with some room for improvement.  >60 Healthy dietary pattern, although there may be some specific behaviors that could be improved.    Nutrition Goals Re-Evaluation:   Nutrition Goals Discharge (Final  Nutrition Goals Re-Evaluation):   Psychosocial: Target Goals: Acknowledge presence or absence of significant depression and/or stress, maximize coping skills, provide positive support system. Participant is able to verbalize types and ability to use techniques and skills needed for reducing stress and depression.  Initial Review & Psychosocial Screening:  Initial Psych Review & Screening - 09/13/22 1014       Initial Review   Current issues with Current Stress Concerns    Source of Stress Concerns Family;Financial;Unable to participate in former interests or hobbies    Comments Pt is currently concerned about not having a relationship with his 2 children, feels ashamed of smoking, and has little interest in doing things, grumpy about [facility] care, [facility], and getting prescriptions in a timely manner      Family Dynamics   Good Support System? Yes    Comments Friends from living facility      Barriers   Psychosocial barriers to participate in program The patient should benefit from training in stress management and relaxation.;Psychosocial barriers identified (see note)      Screening Interventions   Interventions Encouraged to exercise;Provide feedback about the scores to participant;To provide support and resources with identified psychosocial needs    Expected Outcomes Long Term Goal: Stressors or current issues are controlled or eliminated.;Short Term goal: Identification and review  with participant of any Quality of Life or Depression concerns found by scoring the questionnaire.;Long Term goal: The participant improves quality of Life and PHQ9 Scores as seen by post scores and/or verbalization of changes;Short Term goal: Utilizing psychosocial counselor, staff and physician to assist with identification of specific Stressors or current issues interfering with healing process. Setting desired goal for each stressor or current issue identified.   Pt scored high on PHQ2/9. Pt denies current  depression although speaking w/patient there are possible signs. Pt currently declines to speak to a therapist & making an appt for follow up with his PCP. We will continue to assess and monitor his needs.            Quality of Life Scores:  Scores of 19 and below usually indicate a poorer quality of life in these areas.  A difference of  2-3 points is a clinically meaningful difference.  A difference of 2-3 points in the total score of the Quality of Life Index has been associated with significant improvement in overall quality of life, self-image, physical symptoms, and general health in studies assessing change in quality of life.  PHQ-9: Review Flowsheet       09/13/2022 04/24/2018 07/02/2017 03/13/2017  Depression screen PHQ 2/9  Decreased Interest 1 0 1 0 0  Down, Depressed, Hopeless 1 0 1 0 0  PHQ - 2 Score 2 0 2 0 0  Altered sleeping 1 0 0 - -  Tired, decreased energy 2 3 3  - -  Change in appetite 0 0 0 - -  Feeling bad or failure about yourself  0 2 1 - -  Trouble concentrating 2 1 1  - -  Moving slowly or fidgety/restless 0 0 0 - -  Suicidal thoughts 0 0 0 - -  PHQ-9 Score 7 6 7  - -  Difficult doing work/chores Very difficult Very difficult - -   Interpretation of Total Score  Total Score Depression Severity:  1-4 = Minimal depression, 5-9 = Mild depression, 10-14 = Moderate depression, 15-19 = Moderately severe depression, 20-27 = Severe depression   Psychosocial Evaluation and Intervention:  Psychosocial Evaluation - 09/13/22 1019       Psychosocial Evaluation & Interventions   Interventions Stress management education;Encouraged to exercise with the program and follow exercise prescription    Comments Pt is currently concerned about not having a relationship with his 2 children, feels ashamed of still smoking, has low self worth, little interest in doing things, and  has a grumpy disposition due to his [facility] care, [facility], and getting prescriptions in a timely manner     Expected Outcomes Pt will report and employ positive and healthy techniques for stress management. Currently declines referral to his PCP and a therapist    Continue Psychosocial Services  Follow up required by staff   We will continue to monitor and assess            Psychosocial Re-Evaluation:  Psychosocial Re-Evaluation     Row Name 09/16/22 [phone]             Psychosocial Re-Evaluation   Current issues with Current Stress Concerns       Comments No change since assessment on 09/13/2022.       Expected Outcomes For Gorman to attend PR without any psychosocial barriers or concerns.       Interventions Encouraged to attend Pulmonary Rehabilitation for the exercise;Stress management education       Continue Psychosocial Services  Follow  up required by staff                Psychosocial Discharge (Final Psychosocial Re-Evaluation):  Psychosocial Re-Evaluation - 09/16/22 0948       Psychosocial Re-Evaluation   Current issues with Current Stress Concerns    Comments No change since assessment on 09/13/2022.    Expected Outcomes For Smiley to attend PR without any psychosocial barriers or concerns.    Interventions Encouraged to attend Pulmonary Rehabilitation for the exercise;Stress management education    Continue Psychosocial Services  Follow up required by staff             Education: Education Goals: Education classes will be provided on a weekly basis, covering required topics. Participant will state understanding/return demonstration of topics presented.  Learning Barriers/Preferences:  Learning Barriers/Preferences - 09/13/22 1153       Learning Barriers/Preferences   Learning Barriers Sight;Hearing    Learning Preferences Group Instruction;Individual Instruction;Verbal Instruction;Written Material;Skilled Demonstration             Education Topics: Introduction to Pulmonary Rehab Group instruction provided by PowerPoint, verbal discussion, and written  material to support subject matter. Instructor reviews what Pulmonary Rehab is, the purpose of the program, and how patients are referred.     Know Your Numbers Group instruction that is supported by a PowerPoint presentation. Instructor discusses importance of knowing and understanding resting, exercise, and post-exercise oxygen saturation, heart rate, and blood pressure. Oxygen saturation, heart rate, blood pressure, rating of perceived exertion, and dyspnea are reviewed along with a normal range for these values.    Exercise for the Pulmonary Patient Group instruction that is supported by a PowerPoint presentation. Instructor discusses benefits of exercise, core components of exercise, frequency, duration, and intensity of an exercise routine, importance of utilizing pulse oximetry during exercise, safety while exercising, and options of places to exercise outside of rehab.  Flowsheet Row PULMONARY REHAB CHRONIC OBSTRUCTIVE PULMONARY DISEASE from 08/06/2018 in [facility] for Heart, Vascular, & Lung Health  Date 07/30/18  Educator Kirt Boys  Instruction Review Code 1- Verbalizes Understanding          MET Level  Group instruction provided by PowerPoint, verbal discussion, and written material to support subject matter. Instructor reviews what METs are and how to increase METs.    Pulmonary Medications Verbally interactive group education provided by instructor with focus on inhaled medications and proper administration. Flowsheet Row PULMONARY REHAB CHRONIC OBSTRUCTIVE PULMONARY DISEASE from 08/06/2018 in [facility] for Heart, Vascular, & Lung Health  Date 06/30/18  Educator Victorino Dike  Instruction Review Code 1- Verbalizes Understanding       Anatomy and Physiology of the Respiratory System Group instruction provided by PowerPoint, verbal discussion, and written material to support subject matter. Instructor reviews respiratory cycle and  anatomical components of the respiratory system and their functions. Instructor also reviews differences in obstructive and restrictive respiratory diseases with examples of each.  Flowsheet Row PULMONARY REHAB CHRONIC OBSTRUCTIVE PULMONARY DISEASE from 08/06/2018 in [facility] for Heart, Vascular, & Lung Health  Date 07/09/18  Educator RN  Instruction Review Code 1- Verbalizes Understanding       Oxygen Safety Group instruction provided by PowerPoint, verbal discussion, and written material to support subject matter. There is an overview of "What is Oxygen" and "Why do we need it".  Instructor also reviews how to create a safe environment for oxygen use, the importance of using oxygen as prescribed, and  the risks of noncompliance. There is a brief discussion on traveling with oxygen and resources the patient may utilize. Flowsheet Row PULMONARY REHAB CHRONIC OBSTRUCTIVE PULMONARY DISEASE from 08/06/2018 in [facility] for Heart, Vascular, & Lung Health  Date 07/16/18  Educator Kirt Boys  Instruction Review Code 1- Verbalizes Understanding       Oxygen Use Group instruction provided by PowerPoint, verbal discussion, and written material to discuss how supplemental oxygen is prescribed and different types of oxygen supply systems. Resources for more information are provided.    Breathing Techniques Group instruction that is supported by demonstration and informational handouts. Instructor discusses the benefits of pursed lip and diaphragmatic breathing and detailed demonstration on how to perform both.     Risk Factor Reduction Group instruction that is supported by a PowerPoint presentation. Instructor discusses the definition of a risk factor, different risk factors for pulmonary disease, and how the heart and lungs work together.   MD Day A group question and answer session with a medical doctor that allows participants to ask questions  that relate to their pulmonary disease state.   Nutrition for the Pulmonary Patient Group instruction provided by PowerPoint slides, verbal discussion, and written materials to support subject matter. The instructor gives an explanation and review of healthy diet recommendations, which includes a discussion on weight management, recommendations for fruit and vegetable consumption, as well as protein, fluid, caffeine, fiber, sodium, sugar, and alcohol. Tips for eating when patients are short of breath are discussed. Flowsheet Row PULMONARY REHAB CHRONIC OBSTRUCTIVE PULMONARY DISEASE from 08/06/2018 in [facility] for Heart, Vascular, & Lung Health  Date 06/18/18  Educator Danne Harbor  Instruction Review Code 1- Verbalizes Understanding        Other Education Group or individual verbal, written, or video instructions that support the educational goals of the pulmonary rehab program. Flowsheet Row PULMONARY REHAB CHRONIC OBSTRUCTIVE PULMONARY DISEASE from 08/06/2018 in [facility] for Heart, Vascular, & Lung Health  Date 06/25/18  Vertell Novak to hand weights]  Educator Dalton  Instruction Review Code 1- Verbalizes Understanding        Knowledge Questionnaire Score:  Knowledge Questionnaire Score - 09/13/22 1153       Knowledge Questionnaire Score   Pre Score 12/18             Core Components/Risk Factors/Patient Goals at Admission:  Personal Goals and Risk Factors at Admission - 09/13/22 1019       Core Components/Risk Factors/Patient Goals on Admission    Weight Management Yes;Weight Loss    Admit Weight 188 lb (85.3 kg)    Goal Weight: Short Term 175 lb (79.4 kg)    Expected Outcomes Short Term: Continue to assess and modify interventions until short term weight is achieved;Long Term: Adherence to nutrition and physical activity/exercise program aimed toward attainment of established weight goal;Weight Maintenance: Understanding of the  daily nutrition guidelines, which includes 25-35% calories from fat, 7% or less cal from saturated fats, less than 200mg  cholesterol, less than 1.5gm of sodium, & 5 or more servings of fruits and vegetables daily;Weight Loss: Understanding of general recommendations for a balanced deficit meal plan, which promotes 1-2 lb weight loss per week and includes a negative energy balance of [phone] kcal/d;Understanding recommendations for meals to include 15-35% energy as protein, 25-35% energy from fat, 35-60% energy from carbohydrates, less than 200mg  of dietary cholesterol, 20-35 gm of total fiber daily;Understanding of distribution of calorie intake throughout the day with  the consumption of 4-5 meals/snacks    Tobacco Cessation Yes    Number of packs per day 1-3 cigs/day    Intervention Assist the participant in steps to quit. Provide individualized education and counseling about committing to Tobacco Cessation, relapse prevention, and pharmacological support that can be provided by physician.;Education officer, environmental, assist with locating and accessing local/national Quit Smoking programs, and support quit date choice.    Expected Outcomes Short Term: Will demonstrate readiness to quit, by selecting a quit date.;Long Term: Complete abstinence from all tobacco products for at least 12 months from quit date.;Short Term: Will quit all tobacco product use, adhering to prevention of relapse plan.    Improve shortness of breath with ADL's Yes    Intervention Provide education, individualized exercise plan and daily activity instruction to help decrease symptoms of SOB with activities of daily living.    Expected Outcomes Short Term: Improve cardiorespiratory fitness to achieve a reduction of symptoms when performing ADLs;Long Term: Be able to perform more ADLs without symptoms or delay the onset of symptoms    Increase knowledge of respiratory medications and ability to use respiratory devices properly  Yes     Intervention Provide education and demonstration as needed of appropriate use of medications, inhalers, and oxygen therapy.    Expected Outcomes Short Term: Achieves understanding of medications use. Understands that oxygen is a medication prescribed by physician. Demonstrates appropriate use of inhaler and oxygen therapy.;Long Term: Maintain appropriate use of medications, inhalers, and oxygen therapy.    Stress Yes    Intervention Offer individual and/or small group education and counseling on adjustment to heart disease, stress management and health-related lifestyle change. Teach and support self-help strategies.;Refer participants experiencing significant psychosocial distress to appropriate mental health specialists for further evaluation and treatment. When possible, include family members and significant others in education/counseling sessions.    Expected Outcomes Short Term: Participant demonstrates changes in health-related behavior, relaxation and other stress management skills, ability to obtain effective social support, and compliance with psychotropic medications if prescribed.;Long Term: Emotional wellbeing is indicated by absence of clinically significant psychosocial distress or social isolation.             Core Components/Risk Factors/Patient Goals Review:   Goals and Risk Factor Review     Row Name 09/16/22 0951             Core Components/Risk Factors/Patient Goals Review   Personal Goals Review Weight Management/Obesity;Tobacco Cessation;Improve shortness of breath with ADL's;Develop more efficient breathing techniques such as purse lipped breathing and diaphragmatic breathing and practicing self-pacing with activity.;Increase knowledge of respiratory medications and ability to use respiratory devices properly.;Stress       Review No changes since orientation visit on 09/13/2022. Artha is scheduled to start his first session on 09/19/2022.       Expected Outcomes Unable  to evaluate any progress towards his goals yet. Duance will start the program on 09/19/2022.                Core Components/Risk Factors/Patient Goals at Discharge (Final Review):   Goals and Risk Factor Review - 09/16/22 0951       Core Components/Risk Factors/Patient Goals Review   Personal Goals Review Weight Management/Obesity;Tobacco Cessation;Improve shortness of breath with ADL's;Develop more efficient breathing techniques such as purse lipped breathing and diaphragmatic breathing and practicing self-pacing with activity.;Increase knowledge of respiratory medications and ability to use respiratory devices properly.;Stress    Review No changes since orientation visit on 09/13/2022. Jeremian  is scheduled to start his first session on 09/19/2022.    Expected Outcomes Unable to evaluate any progress towards his goals yet. Duance will start the program on 09/19/2022.             ITP Comments:Pt is making expected progress toward Pulmonary Rehab goals after completing 0 sessions. Recommend continued exercise, life style modification, education, and utilization of breathing techniques to increase stamina and strength, while also decreasing shortness of breath with exertion.  Dr. Mechele Collin is Medical Director for Pulmonary Rehab at [facility].     Comments:

## 2022-09-18 NOTE — Telephone Encounter (Signed)
RN called pt to follow up with recent [place] [facility] concerns. All needs met at this time. Will see the patient in Pulmonary Rehab tomorrow.

## 2022-09-19 ENCOUNTER — Encounter (HOSPITAL_COMMUNITY)
Admission: RE | Admit: 2022-09-19 | Discharge: 2022-09-19 | Disposition: A | Discharge status: Home / Self Care | Payer: No Typology Code available for payment source | Source: Ambulatory Visit | Attending: Internal Medicine | Admitting: Internal Medicine

## 2022-09-19 DIAGNOSIS — G4733 Obstructive sleep apnea (adult) (pediatric): Secondary | ICD-10-CM

## 2022-09-19 NOTE — Progress Notes (Signed)
Daily Session Note  Patient Details  Name: Steve Riley MRN: [id number] Date of Birth: 1950/12/21 Referring Provider:   [name] Pulmonary Rehab Walk Test from 09/13/2022 in [facility] for Heart, Vascular, & Lung Health  Referring Provider [name] coverage)       Encounter Date: 09/19/2022  Check In:  Session Check In - 09/19/22 1153       Check-In   Supervising physician immediately available to respond to emergencies [facility] MD immediately available    Physician(s) Carlos Levering, NP    Location [facility]    Staff Present Essie Hart, RN, Doris Cheadle, MS, ACSM-CEP, Exercise Physiologist;Casey Erin Sons BS, ACSM-CEP, Exercise Physiologist;Samantha Belarus, RD, LDN    Virtual Visit No    Medication changes reported     No    Fall or balance concerns reported    No    Tobacco Cessation No Change    Warm-up and Cool-down Performed as group-led instruction    Resistance Training Performed Yes    VAD Patient? No    PAD/SET Patient? No      Pain Assessment   Currently in Pain? No/denies    Multiple Pain Sites No             Capillary Blood Glucose: No results found for this or any previous visit (from the past 24 hour(s)).    Social History   Tobacco Use  Smoking Status Every Day   Packs/day: 0.25   Years: 54.00   Additional pack years: 0.00   Total pack years: 13.50   Types: Cigarettes   Last attempt to quit: 07/11/2016   Years since quitting: 6.1  Smokeless Tobacco Never  Tobacco Comments   occassionally 1-3 cigarettes per day .  Updated 09/05/2022.    Goals Met:  Exercise tolerated well No report of concerns or symptoms today Strength training completed today  Goals Unmet:  Not Applicable  Comments: First day of group exercise, tolerated well. Service time is from 1009 to 1150.    Dr. Mechele Collin is Medical Director for Pulmonary Rehab at [facility].

## 2022-09-24 ENCOUNTER — Encounter (HOSPITAL_COMMUNITY)
Admission: RE | Admit: 2022-09-24 | Discharge: 2022-09-24 | Disposition: A | Discharge status: Home / Self Care | Payer: No Typology Code available for payment source | Source: Ambulatory Visit | Attending: Internal Medicine | Admitting: Internal Medicine

## 2022-09-24 VITALS — Wt 188.1 lb

## 2022-09-24 DIAGNOSIS — G4733 Obstructive sleep apnea (adult) (pediatric): Secondary | ICD-10-CM | POA: Diagnosis not present

## 2022-09-24 NOTE — Progress Notes (Signed)
Daily Session Note  Patient Details  Name: Steve Riley MRN: [id number] Date of Birth: December 07, 1950 Referring Provider:   [name] Pulmonary Rehab Walk Test from 09/13/2022 in [facility] for Heart, Vascular, & Lung Health  [name] coverage)       Encounter Date: 09/24/2022  Check In:  Session Check In - 09/24/22 1219       Check-In   Supervising physician immediately available to respond to emergencies [facility] MD immediately available    Physician(s) Jari Favre, PA    Location [facility]    Staff Present Essie Hart, RN, Doris Cheadle, MS, ACSM-CEP, Exercise Physiologist;Bijan Ridgley Erin Sons BS, ACSM-CEP, Exercise Physiologist    Virtual Visit No    Medication changes reported     Yes    Comments Started taking spiriva and symbicort instead of Breztri    Fall or balance concerns reported    No    Tobacco Cessation No Change    Warm-up and Cool-down Performed as group-led instruction    Resistance Training Performed Yes    VAD Patient? No    PAD/SET Patient? No      Pain Assessment   Currently in Pain? No/denies    Multiple Pain Sites No             Capillary Blood Glucose: No results found for this or any previous visit (from the past 24 hour(s)).   Exercise Prescription Changes - 09/24/22 1200       Response to Exercise   Blood Pressure (Admit) 128/60    Blood Pressure (Exercise) 110/78    Blood Pressure (Exit) 120/70    Heart Rate (Admit) 81 bpm    Heart Rate (Exercise) 87 bpm    Heart Rate (Exit) 88 bpm    Oxygen Saturation (Admit) 94 %    Oxygen Saturation (Exercise) 94 %    Oxygen Saturation (Exit) 95 %    Rating of Perceived Exertion (Exercise) 13    Perceived Dyspnea (Exercise) 1    Duration Continue with 30 min of aerobic exercise without signs/symptoms of physical distress.    Intensity THRR unchanged      Progression   Progression Continue to progress workloads to  maintain intensity without signs/symptoms of physical distress.      Resistance Training   Training Prescription Yes    Reps 10-15    Time 10 Minutes      Oxygen   Oxygen Continuous    Liters 2      NuStep   Level 1    SPM 60    Minutes 30    METs 1.8      Oxygen   Maintain Oxygen Saturation 88% or higher             Social History   Tobacco Use  Smoking Status Every Day   Packs/day: 0.25   Years: 54.00   Additional pack years: 0.00   Total pack years: 13.50   Types: Cigarettes   Last attempt to quit: 07/11/2016   Years since quitting: 6.2  Smokeless Tobacco Never  Tobacco Comments   occassionally 1-3 cigarettes per day .  Updated 09/05/2022.    Goals Met:  Proper associated with RPD/PD & O2 Sat Independence with exercise equipment Exercise tolerated well No report of concerns or symptoms today Strength training completed today  Goals Unmet:  Not Applicable  Comments: Service time is from 1027 to 1140.    Dr. Erskine Squibb  Loanne Drilling is Market researcher for Pulmonary Rehab at [place].

## 2022-09-26 ENCOUNTER — Encounter (HOSPITAL_COMMUNITY)
Admission: RE | Admit: 2022-09-26 | Discharge: 2022-09-26 | Disposition: A | Discharge status: Home / Self Care | Payer: No Typology Code available for payment source | Source: Ambulatory Visit | Attending: Internal Medicine | Admitting: Internal Medicine

## 2022-09-26 DIAGNOSIS — G4733 Obstructive sleep apnea (adult) (pediatric): Secondary | ICD-10-CM

## 2022-09-26 NOTE — Progress Notes (Signed)
Daily Session Note  Patient Details  Name: Steve Riley MRN: [id number] Date of Birth: 1951/03/26 Referring Provider:   [name] Pulmonary Rehab Walk Test from 09/13/2022 in [facility] for Heart, Vascular, & Lung Health  [name] coverage)       Encounter Date: 09/26/2022  Check In:  Session Check In - 09/26/22 1207       Check-In   Supervising physician immediately available to respond to emergencies [facility] MD immediately available    Physician(s) Eligha Bridegroom, NP    [facility]    Staff Present Essie Hart, RN, Doris Cheadle, MS, ACSM-CEP, Exercise Physiologist;Duha Abair Erin Sons BS, ACSM-CEP, Exercise Physiologist;Samantha Belarus, RD, LDN    Virtual Visit No    Medication changes reported     No    Fall or balance concerns reported    No    Tobacco Cessation No Change    Warm-up and Cool-down Performed as group-led instruction    Resistance Training Performed Yes    VAD Patient? No    PAD/SET Patient? No      Pain Assessment   Currently in Pain? No/denies    Multiple Pain Sites No             Capillary Blood Glucose: No results found for this or any previous visit (from the past 24 hour(s)).    Social History   Tobacco Use  Smoking Status Every Day   Packs/day: 0.25   Years: 54.00   Additional pack years: 0.00   Total pack years: 13.50   Types: Cigarettes   Last attempt to quit: 07/11/2016   Years since quitting: 6.2  Smokeless Tobacco Never  Tobacco Comments   occassionally 1-3 cigarettes per day .  Updated 09/05/2022.    Goals Met:  Proper associated with RPD/PD & O2 Sat Independence with exercise equipment Exercise tolerated well No report of concerns or symptoms today Strength training completed today  Goals Unmet:  Not Applicable  Comments: Service time is from 1020 to 1150.    Dr. Mechele Collin is Medical Director for Pulmonary Rehab at [facility].

## 2022-09-27 ENCOUNTER — Other Ambulatory Visit: Payer: Self-pay | Admitting: Internal Medicine

## 2022-09-27 MED ORDER — BENZONATATE 100 MG PO CAPS: 100.0000 mg | ORAL_CAPSULE | Freq: Two times a day (BID) | ORAL | 2 refills | Status: DC | PRN

## 2022-10-01 ENCOUNTER — Encounter (HOSPITAL_COMMUNITY)
Admission: RE | Admit: 2022-10-01 | Discharge: 2022-10-01 | Disposition: A | Discharge status: Home / Self Care | Payer: No Typology Code available for payment source | Source: Ambulatory Visit | Attending: Internal Medicine | Admitting: Internal Medicine

## 2022-10-01 DIAGNOSIS — G4733 Obstructive sleep apnea (adult) (pediatric): Secondary | ICD-10-CM

## 2022-10-01 NOTE — Progress Notes (Signed)
Daily Session Note  Patient Details  Name: Steve Riley MRN: [id number] Date of Birth: 11-10-1950 Referring Provider:   [name] Pulmonary Rehab Walk Test from 09/13/2022 in [facility] for Heart, Vascular, & Lung Health  [name])       Encounter Date: 10/01/2022  Check In:  Session Check In - 10/01/22 1221       Check-In   Supervising physician immediately available to respond to emergencies [facility] MD immediately available    Physician(s) Eligha Bridegroom, NP    [facility]    Staff Present Samantha Belarus, RD, Dutch Gray, RN, BSN;Randi Reeve BS, ACSM-CEP, Exercise Physiologist;Kaylee Earlene Plater, MS, ACSM-CEP, Exercise Physiologist;Angie Piercey Katrinka Blazing, RT    Virtual Visit No    Medication changes reported     No    Fall or balance concerns reported    No    Tobacco Cessation No Change    Warm-up and Cool-down Performed as group-led instruction    Resistance Training Performed Yes    VAD Patient? No    PAD/SET Patient? No      Pain Assessment   Currently in Pain? No/denies    Multiple Pain Sites No             Capillary Blood Glucose: No results found for this or any previous visit (from the past 24 hour(s)).    Social History   Tobacco Use  Smoking Status Every Day   Packs/day: 0.25   Years: 54.00   Additional pack years: 0.00   Total pack years: 13.50   Types: Cigarettes   Last attempt to quit: 07/11/2016   Years since quitting: 6.2  Smokeless Tobacco Never  Tobacco Comments   occassionally 1-3 cigarettes per day .  Updated 09/05/2022.    Goals Met:  Proper associated with RPD/PD & O2 Sat Independence with exercise equipment Exercise tolerated well No report of concerns or symptoms today Strength training completed today  Goals Unmet:  Not Applicable  Comments: Service time is from 1017 to 1150.    Dr. Mechele Collin is Medical Director for Pulmonary Rehab at [facility].

## 2022-10-03 ENCOUNTER — Encounter (HOSPITAL_COMMUNITY)
Admission: RE | Admit: 2022-10-03 | Discharge: 2022-10-03 | Disposition: A | Discharge status: Home / Self Care | Payer: No Typology Code available for payment source | Source: Ambulatory Visit | Attending: Internal Medicine | Admitting: Internal Medicine

## 2022-10-03 DIAGNOSIS — G4733 Obstructive sleep apnea (adult) (pediatric): Secondary | ICD-10-CM | POA: Diagnosis not present

## 2022-10-03 NOTE — Progress Notes (Signed)
Daily Session Note  Patient Details  Name: Steve Riley MRN: [id number] Date of Birth: [birth year] Referring Provider:   [name] Pulmonary Rehab Walk Test from 09/13/2022 in [facility], [facility], & [facility]  [name])       Encounter Date: 10/03/2022  Check In:  Session Check In - 10/03/22 1218       Check-In   Supervising physician immediately available to respond to emergencies [facility] MD immediately available    Physician(s) Bernadene Person, NP    Location [facility]    Staff Present Samantha Belarus, RD, Dutch Gray, RN, BSN;Randi Reeve BS, ACSM-CEP, Exercise Physiologist;Kaylee Earlene Plater, MS, ACSM-CEP, Exercise Physiologist;Adron Geisel Katrinka Blazing, RT    Virtual Visit No    Medication changes reported     No    Fall or balance concerns reported    No    Tobacco Cessation No Change    Warm-up and Cool-down Performed as group-led instruction    Resistance Training Performed Yes    VAD Patient? No    PAD/SET Patient? No      Pain Assessment   Currently in Pain? No/denies    Multiple Pain Sites No             Capillary Blood Glucose: No results found for this or any previous visit (from the past 24 hour(s)).    Social History   Tobacco Use  Smoking Status Every Day   Packs/day: 0.25   Years: 54.00   Additional pack years: 0.00   Total pack years: 13.50   Types: Cigarettes   Last attempt to quit: 07/11/2016   Years since quitting: 6.2  Smokeless Tobacco Never  Tobacco Comments   occassionally 1-3 cigarettes per day .  Updated 09/05/2022.    Goals Met:  Proper associated with RPD/PD & O2 Sat Independence with exercise equipment Exercise tolerated well No report of concerns or symptoms today Strength training completed today  Goals Unmet:  Not Applicable  Comments: Service time is from 1012 to 1200.    Dr. Mechele Collin is Medical Director for Pulmonary Rehab at [facility].

## 2022-10-08 ENCOUNTER — Encounter (HOSPITAL_COMMUNITY)
Admission: RE | Admit: 2022-10-08 | Discharge: 2022-10-08 | Disposition: A | Discharge status: Home / Self Care | Payer: No Typology Code available for payment source | Source: Ambulatory Visit | Attending: Internal Medicine | Admitting: Internal Medicine

## 2022-10-08 ENCOUNTER — Encounter (HOSPITAL_COMMUNITY): Payer: Self-pay

## 2022-10-08 VITALS — Wt 187.2 lb

## 2022-10-08 DIAGNOSIS — G4733 Obstructive sleep apnea (adult) (pediatric): Secondary | ICD-10-CM | POA: Diagnosis not present

## 2022-10-08 NOTE — Progress Notes (Signed)
Daily Session Note  Patient Details  Name: Steve Riley MRN: [id number] Date of Birth: September 23, 1950 Referring Provider:   [name] Pulmonary Rehab Walk Test from 09/13/2022 in [facility] for [facility], Vascular, & [facility]  [name])       Encounter Date: 10/08/2022  Check In:  Session Check In - 10/08/22 1153       Check-In   Supervising physician immediately available to respond to emergencies [facility] immediately available    Physician(s) Robin Searing, NP    [facility]    Staff Present Samantha Belarus, RD, Dutch Gray, RN, BSN;Randi Idelle Crouch BS, ACSM-CEP, Exercise Physiologist;Kaylee Earlene Plater, MS, ACSM-CEP, Exercise Physiologist;Casey Katrinka Blazing, RT    Virtual Visit No    Medication changes reported     No    Fall or balance concerns reported    No    Tobacco Cessation No Change    Warm-up and Cool-down Performed as group-led instruction    Resistance Training Performed Yes    VAD Patient? No    PAD/SET Patient? No      Pain Assessment   Currently in Pain? No/denies    Multiple Pain Sites No             Capillary Blood Glucose: No results found for this or any previous visit (from the past 24 hour(s)).   Exercise Prescription Changes - 10/08/22 1200       Response to Exercise   Blood Pressure (Admit) 122/58    Blood Pressure (Exercise) 124/66    Blood Pressure (Exit) 122/74    Heart Rate (Admit) 69 bpm    Heart Rate (Exercise) 92 bpm    Heart Rate (Exit) 73 bpm    Oxygen Saturation (Admit) 94 %    Oxygen Saturation (Exercise) 94 %    Oxygen Saturation (Exit) 94 %    Rating of Perceived Exertion (Exercise) 13    Perceived Dyspnea (Exercise) 2    Duration Continue with 30 min of aerobic exercise without signs/symptoms of physical distress.    Intensity THRR unchanged      Progression   Progression Continue to progress workloads to maintain intensity without signs/symptoms of  physical distress.      Resistance Training   Training Prescription Yes    Weight red bands    Reps 10-15    Time 10 Minutes      Oxygen   Oxygen Continuous    Liters 2      NuStep   Level 2    SPM 60    Minutes 15    METs 1.9      Track   Laps 8    Minutes 15    METs 2.23      Oxygen   Maintain Oxygen Saturation 88% or higher             Social History   Tobacco Use  Smoking Status Every Day   Packs/day: 0.25   Years: 54.00   Additional pack years: 0.00   Total pack years: 13.50   Types: Cigarettes   Last attempt to quit: 07/11/2016   Years since quitting: 6.2  Smokeless Tobacco Never  Tobacco Comments   occassionally 1-3 cigarettes per day .  Updated 09/05/2022.    Goals Met:  Improved SOB with ADL's Exercise tolerated well No report of concerns or symptoms today Strength training completed today  Goals Unmet:  Not Applicable  Comments: Service time  is from 1009 to 1141    Dr. Mechele Collin is Medical Director for Pulmonary Rehab at [facility].

## 2022-10-10 ENCOUNTER — Encounter (HOSPITAL_COMMUNITY)
Admission: RE | Admit: 2022-10-10 | Discharge: 2022-10-10 | Disposition: A | Discharge status: Home / Self Care | Payer: No Typology Code available for payment source | Source: Ambulatory Visit | Attending: Internal Medicine | Admitting: Internal Medicine

## 2022-10-10 DIAGNOSIS — G4733 Obstructive sleep apnea (adult) (pediatric): Secondary | ICD-10-CM | POA: Diagnosis present

## 2022-10-10 NOTE — Progress Notes (Signed)
Daily Session Note  Patient Details  Name: Steve Riley MRN: [id number] Date of Birth: [birth year] Referring Provider:   [name] Pulmonary Rehab Walk Test from 09/13/2022 in [facility] for [facility], Vascular, & [facility]  [name])       Encounter Date: 10/10/2022  Check In:  Session Check In - 10/10/22 1132       Check-In   Supervising physician immediately available to respond to emergencies [facility] immediately available    Physician(s) Edd Fabian, NP    [facility]    Staff Present Essie Hart, RN, BSN;Randi Idelle Crouch BS, ACSM-CEP, Exercise Physiologist;Lashondra Vaquerano Earlene Plater, MS, ACSM-CEP, Exercise Physiologist;Casey Katrinka Blazing, RT    Virtual Visit No    Medication changes reported     No    Fall or balance concerns reported    No    Tobacco Cessation No Change    Warm-up and Cool-down Performed as group-led instruction    Resistance Training Performed Yes    VAD Patient? No    PAD/SET Patient? No      Pain Assessment   Currently in Pain? No/denies    Multiple Pain Sites No             Capillary Blood Glucose: No results found for this or any previous visit (from the past 24 hour(s)).    Social History   Tobacco Use  Smoking Status Every Day   Packs/day: 0.25   Years: 54.00   Additional pack years: 0.00   Total pack years: 13.50   Types: Cigarettes   Last attempt to quit: 07/11/2016   Years since quitting: 6.2  Smokeless Tobacco Never  Tobacco Comments   occassionally 1-3 cigarettes per day .  Updated 09/05/2022.    Goals Met:  Proper associated with RPD/PD & O2 Sat Exercise tolerated well No report of concerns or symptoms today Strength training completed today  Goals Unmet:  Not Applicable  Comments: Service time is from 1008 to 1150.    Dr. Mechele Collin is Medical Director for Pulmonary Rehab at [facility]

## 2022-10-14 ENCOUNTER — Telehealth: Payer: Self-pay | Admitting: Adult Health

## 2022-10-15 ENCOUNTER — Encounter (HOSPITAL_COMMUNITY)
Admission: RE | Admit: 2022-10-15 | Discharge: 2022-10-15 | Disposition: A | Discharge status: Home / Self Care | Payer: No Typology Code available for payment source | Source: Ambulatory Visit | Attending: Internal Medicine

## 2022-10-15 DIAGNOSIS — G4733 Obstructive sleep apnea (adult) (pediatric): Secondary | ICD-10-CM

## 2022-10-15 NOTE — Progress Notes (Signed)
Daily Session Note  Patient Details  Name: Steve Riley MRN: [id number] Date of Birth: 1950/11/21 Referring Provider:   [name] Pulmonary Rehab Walk Test from 09/13/2022 in [facility] for [facility], Vascular, & Lung [facility]  [name])       Encounter Date: 10/15/2022  Check In:  Session Check In - 10/15/22 1220       Check-In   Supervising physician immediately available to respond to emergencies [facility] immediately available    Physician(s) Carlyon Shadow, NP    [facility]    Staff Present Karlene Lineman, RN, Lavonia Dana, RN, Doris Cheadle, MS, ACSM-CEP, Exercise Physiologist;Randi Dionisio Paschal, ACSM-CEP, Exercise Physiologist;Karime Scheuermann Hermine Messick Belarus, RD, LDN    Virtual Visit No    Medication changes reported     No    Fall or balance concerns reported    No    Tobacco Cessation No Change    Warm-up and Cool-down Performed as group-led instruction    Resistance Training Performed Yes    VAD Patient? No    PAD/SET Patient? No      Pain Assessment   Currently in Pain? No/denies    Multiple Pain Sites No             Capillary Blood Glucose: No results found for this or any previous visit (from the past 24 hour(s)).    Social History   Tobacco Use  Smoking Status Every Day   Packs/day: 0.25   Years: 54.00   Additional pack years: 0.00   Total pack years: 13.50   Types: Cigarettes   Last attempt to quit: 07/11/2016   Years since quitting: 6.2  Smokeless Tobacco Never  Tobacco Comments   occassionally 1-3 cigarettes per day .  Updated 09/05/2022.    Goals Met:  Proper associated with RPD/PD & O2 Sat Independence with exercise equipment Exercise tolerated well No report of concerns or symptoms today Strength training completed today  Goals Unmet:  Not Applicable  Comments: Service time is from 1006 to 1146.    Dr. Mechele Collin is Medical Director for  Pulmonary Rehab at [facility].

## 2022-10-16 MED ORDER — BENZONATATE 100 MG PO CAPS: 100.0000 mg | ORAL_CAPSULE | Freq: Two times a day (BID) | ORAL | 2 refills | Status: DC | PRN

## 2022-10-16 MED ORDER — BUDESONIDE-FORMOTEROL FUMARATE 160-4.5 MCG/ACT IN AERO: 2.0000 | INHALATION_SPRAY | Freq: Two times a day (BID) | RESPIRATORY_TRACT | 5 refills | Status: AC

## 2022-10-16 NOTE — Addendum Note (Signed)
Addended by: Glynda Jaeger on: 10/16/2022 11:49 AM   Modules accepted: Orders

## 2022-10-16 NOTE — Telephone Encounter (Signed)
Called and informed pt about prescriptions. Pt verbalized understanding, nothing further needed at this time

## 2022-10-16 NOTE — Telephone Encounter (Signed)
Patient also needs refill for [facility]. [facility] is [facility]. Patient phone number is [phone].

## 2022-10-16 NOTE — Telephone Encounter (Signed)
I have printed off the prescriptions which was signed by Dr.Desai and faxed to the [facility]. Nothing further needed at this time.

## 2022-10-17 ENCOUNTER — Encounter (HOSPITAL_COMMUNITY)
Admission: RE | Admit: 2022-10-17 | Discharge: 2022-10-17 | Disposition: A | Discharge status: Home / Self Care | Payer: No Typology Code available for payment source | Source: Ambulatory Visit | Attending: Internal Medicine | Admitting: Internal Medicine

## 2022-10-17 DIAGNOSIS — G4733 Obstructive sleep apnea (adult) (pediatric): Secondary | ICD-10-CM

## 2022-10-17 NOTE — Progress Notes (Signed)
Daily Session Note  Patient Details  Name: Steve Riley MRN: [id number] Date of Birth: 06-07-1951 Referring Provider:   [name] Pulmonary Rehab Walk Test from 09/13/2022 in [facility] for [facility], V[facility], & [facility]  [name])       Encounter Date: 10/17/2022  Check In:  Session Check In - 10/17/22 1130       Check-In   Supervising physician immediately available to respond to emergencies [facility] immediately available    Physician(s) Jari Favre, PA    [facility]    Staff Present Essie Hart, RN, Doris Cheadle, MS, ACSM-CEP, Exercise Physiologist;Randi Dionisio Paschal, ACSM-CEP, Exercise Physiologist;Casey Heloise Ochoa, MS, ACSM-CEP, CCRP, Exercise Physiologist    Virtual Visit No    Medication changes reported     No    Fall or balance concerns reported    No    Tobacco Cessation No Change    Warm-up and Cool-down Performed as group-led instruction    Resistance Training Performed Yes    VAD Patient? No    PAD/SET Patient? No      Pain Assessment   Currently in Pain? No/denies    Multiple Pain Sites No             Capillary Blood Glucose: No results found for this or any previous visit (from the past 24 hour(s)).    Social History   Tobacco Use  Smoking Status Every Day   Packs/day: 0.25   Years: 54.00   Additional pack years: 0.00   Total pack years: 13.50   Types: Cigarettes   Last attempt to quit: 07/11/2016   Years since quitting: 6.2  Smokeless Tobacco Never  Tobacco Comments   occassionally 1-3 cigarettes per day .  Updated 09/05/2022.    Goals Met:  Improved SOB with ADL's Exercise tolerated well No report of concerns or symptoms today Strength training completed today  Goals Unmet:  Not Applicable  Comments: Service time is from 1018 to 1146    Dr. Mechele Collin is Medical Director for Pulmonary Rehab at [facility].

## 2022-10-22 ENCOUNTER — Encounter (HOSPITAL_COMMUNITY)
Admission: RE | Admit: 2022-10-22 | Discharge: 2022-10-22 | Disposition: A | Discharge status: Home / Self Care | Payer: No Typology Code available for payment source | Source: Ambulatory Visit | Attending: Internal Medicine | Admitting: Internal Medicine

## 2022-10-22 VITALS — Wt 187.2 lb

## 2022-10-22 DIAGNOSIS — G4733 Obstructive sleep apnea (adult) (pediatric): Secondary | ICD-10-CM

## 2022-10-22 NOTE — Progress Notes (Signed)
Daily Session Note  Patient Details  Name: Steve Riley MRN: [id number] Date of Birth: 01-May-1951 Referring Provider:   [name] Pulmonary Rehab Walk Test from 09/13/2022 in [facility] for [facility], V[facility], [facility]  [name])       Encounter Date: 10/22/2022  Check In:  Session Check In - 10/22/22 1127       Check-In   Supervising physician immediately available to respond to emergencies [facility] immediately available    Physician(s) Jari Favre, PA    [facility]    Staff Present Samantha Belarus, RD, Dutch Gray, RN, BSN;Randi Reeve BS, ACSM-CEP, Exercise Physiologist;Olinty Peggye Pitt, MS, ACSM-CEP, Exercise Physiologist;Kaylee Earlene Plater, MS, ACSM-CEP, Exercise Physiologist;Jazz Rogala Katrinka Blazing, RT    Virtual Visit No    Medication changes reported     No    Fall or balance concerns reported    No    Tobacco Cessation No Change    Warm-up and Cool-down Performed as group-led instruction    Resistance Training Performed Yes    VAD Patient? No    PAD/SET Patient? No      Pain Assessment   Currently in Pain? No/denies    Multiple Pain Sites No             Capillary Blood Glucose: No results found for this or any previous visit (from the past 24 hour(s)).   Exercise Prescription Changes - 10/22/22 1200       Response to Exercise   Blood Pressure (Admit) 114/62    Blood Pressure (Exercise) 130/70    Blood Pressure (Exit) 122/64    Heart Rate (Admit) 92 bpm    Heart Rate (Exercise) 101 bpm    Heart Rate (Exit) 78 bpm    Oxygen Saturation (Admit) 95 %    Oxygen Saturation (Exercise) 94 %    Oxygen Saturation (Exit) 96 %    Rating of Perceived Exertion (Exercise) 11    Perceived Dyspnea (Exercise) 1    Duration Continue with 30 min of aerobic exercise without signs/symptoms of physical distress.    Intensity THRR unchanged      Progression   Progression Continue to progress workloads  to maintain intensity without signs/symptoms of physical distress.      Resistance Training   Training Prescription Yes    Weight red bands    Reps 10-15    Time 10 Minutes      Oxygen   Oxygen Continuous    Liters 2      NuStep   Level 2    Minutes 15    METs 2      Track   Laps 11    Minutes 15    METs 2.69      Oxygen   Maintain Oxygen Saturation 88% or higher             Social History   Tobacco Use  Smoking Status Every Day   Packs/day: 0.25   Years: 54.00   Additional pack years: 0.00   Total pack years: 13.50   Types: Cigarettes   Last attempt to quit: 07/11/2016   Years since quitting: 6.2  Smokeless Tobacco Never  Tobacco Comments   occassionally 1-3 cigarettes per day .  Updated 09/05/2022.    Goals Met:  Proper associated with RPD/PD & O2 Sat Independence with exercise equipment Exercise tolerated well No report of concerns or symptoms today Strength training completed today  Goals Unmet:  Not Applicable  Comments: Service time is from 1010 to 1150.    Dr. Mechele Collin is Medical Director for Pulmonary Rehab at [facility].

## 2022-10-23 NOTE — Progress Notes (Signed)
Pulmonary Individual Treatment Plan  Patient Details  Name: Steve Riley MRN: [id number] Date of Birth: 12/02/1950 Referring Provider:   [name] Pulmonary Rehab Walk Test from 09/13/2022 in [facility]  [name] coverage)       Initial Encounter Date:  Flowsheet Row Pulmonary Rehab Walk Test from 09/13/2022 in [facility]  Date 09/13/22       Visit Diagnosis: OSA (obstructive sleep apnea)  Patient's Home Medications on Admission:   Current Outpatient Medications:    albuterol (PROVENTIL) (2.5 MG/3ML) 0.083% nebulizer solution, Take 3 mLs (2.5 mg total) by nebulization every 6 (six) hours as needed for wheezing or shortness of breath., Disp: 75 mL, Rfl: 5   albuterol (PROVENTIL) (2.5 MG/3ML) 0.083% nebulizer solution, Take 3 mLs (2.5 mg total) by nebulization every 6 (six) hours as needed for wheezing or shortness of breath., Disp: 120 mL, Rfl: 11   albuterol (VENTOLIN HFA) 108 (90 Base) MCG/ACT inhaler, Inhale 1-2 puffs into the lungs every 6 (six) hours as needed for wheezing. (Patient not taking: Reported on 09/13/2022), Disp: 18 g, Rfl: 4   APPLE CIDER VINEGAR PO, Take by mouth in the morning and at bedtime. , Disp: , Rfl:    Ascorbic Acid (VITAMIN C WITH ROSE HIPS) 1000 MG tablet, Take 1,000 mg by mouth daily., Disp: , Rfl:    aspirin 81 MG chewable tablet, Chew 1 tablet (81 mg total) by mouth 2 (two) times daily. (Patient not taking: Reported on 02/14/2022), Disp: 60 tablet, Rfl: 0   benzonatate (TESSALON PERLES) 100 MG capsule, Take 1 capsule (100 mg total) by mouth 2 (two) times daily as needed for cough., Disp: 60 capsule, Rfl: 2   budesonide-formoterol (SYMBICORT) 160-4.5 MCG/ACT inhaler, Inhale 2 puffs into the lungs 2 (two) times daily., Disp: 30.6 each, Rfl: 5   Calcium Carbonate-Vitamin D 600-400 MG-UNIT chew tablet, Chew 1 tablet by  mouth 2 (two) times daily. , Disp: , Rfl:    Cholecalciferol (DIALYVITE VITAMIN D 5000) 125 MCG (5000 UT) capsule, Take 5,000 Units by mouth daily., Disp: , Rfl:    Cyanocobalamin (B-12) 5000 MCG CAPS, Take 5,000 mcg by mouth daily., Disp: , Rfl:    Flaxseed, Linseed, (FLAX PO), Take 2,600 mg by mouth daily., Disp: , Rfl:    folic acid (FOLVITE) 1 MG tablet, Take 1 tablet by mouth daily., Disp: , Rfl:    methocarbamol (ROBAXIN) 500 MG tablet, Take 1 tablet (500 mg total) by mouth every 6 (six) hours as needed for muscle spasms. (Patient not taking: Reported on 02/14/2022), Disp: 60 tablet, Rfl: 0   Methylsulfonylmethane (MSM) 1000 MG CAPS, Take 1,000 mg by mouth 2 (two) times daily., Disp: , Rfl:    ondansetron (ZOFRAN) 4 MG tablet, Take 1 tablet (4 mg total) by mouth every 8 (eight) hours as needed for up to 20 doses for nausea or vomiting. (Patient not taking: Reported on 02/14/2022), Disp: 20 tablet, Rfl: 0   OVER THE COUNTER MEDICATION, Take 4,000 Units by mouth in the morning and at bedtime. Serrapeptase, Disp: , Rfl:    oxyCODONE (OXY IR/ROXICODONE) 5 MG immediate release tablet, Take 1-2 tablets (5-10 mg total) by mouth every 4 (four) hours as needed for moderate pain (pain score 4-6). (Patient not taking: Reported on 02/14/2022), Disp: 30 tablet, Rfl: 0   predniSONE (DELTASONE) 20 MG tablet, Take 2 tablets (40 mg total)  by mouth daily with breakfast. (Patient not taking: Reported on 09/05/2022), Disp: 10 tablet, Rfl: 0   Probiotic CAPS, Take 1 capsule by mouth 2 (two) times daily with a meal., Disp: , Rfl:    selenium 200 MCG TABS tablet, Take 200 mcg by mouth daily., Disp: , Rfl:    silver sulfADIAZINE (SILVADENE) 1 % cream, Apply 1 application  topically in the morning, at noon, and at bedtime., Disp: , Rfl:    Tiotropium Bromide Monohydrate (SPIRIVA RESPIMAT) 2.5 MCG/ACT AERS, Inhale 1 puff into the lungs in the morning and at bedtime., Disp: 12 g, Rfl: 2   traZODone (DESYREL) 100 MG tablet, Take  100 mg by mouth at bedtime. (Patient not taking: Reported on 02/14/2022), Disp: , Rfl:    Turmeric Curcumin 500 MG CAPS, Take 500 mg by mouth in the morning and at bedtime.  (Patient not taking: Reported on 09/05/2022), Disp: , Rfl:    Zinc 50 MG TABS, Take 50 mg by mouth daily. (Patient not taking: Reported on 02/14/2022), Disp: , Rfl:   Past Medical History: Past Medical History:  Diagnosis Date   COPD (chronic obstructive pulmonary disease) (HCC)    Pneumonia    Prostate cancer (HCC)     Tobacco Use: Social History   Tobacco Use  Smoking Status Every Day   Packs/day: 0.25   Years: 54.00   Additional pack years: 0.00   Total pack years: 13.50   Types: Cigarettes   Last attempt to quit: 07/11/2016   Years since quitting: 6.2  Smokeless Tobacco Never  Tobacco Comments   occassionally 1-3 cigarettes per day .  Updated 09/05/2022.    Labs: Review Flowsheet       Latest Ref Rng & Units 03/06/2018 03/07/2018  Labs for ITP Cardiac and Pulmonary Rehab  Hemoglobin A1c 4.8 - 5.6 % - 6.5   Bicarbonate 20.0 - 28.0 mmol/L 22.9  -  TCO2 22 - 32 mmol/L 24  -  Acid-base deficit 0.0 - 2.0 mmol/L 1.0  -  O2 Saturation % 81.0  -    Capillary Blood Glucose: Lab Results  Component Value Date   GLUCAP 92 08/18/2019   GLUCAP 86 03/09/2018   GLUCAP 104 (H) 03/09/2018   GLUCAP 139 (H) 03/08/2018   GLUCAP 225 (H) 03/07/2018     Pulmonary Assessment Scores:  Pulmonary Assessment Scores     Row Name 09/13/22 1037         ADL UCSD   SOB Score total 58       CAT Score   CAT Score 19       mMRC Score   mMRC Score 4             UCSD: Self-administered rating of dyspnea associated with activities of daily living (ADLs) 6-point scale (0 = "not at all" to 5 = "maximal or unable to do because of breathlessness")  Scoring Scores range from 0 to 120.  Minimally important difference is 5 units  CAT: CAT can identify the health impairment of COPD patients and is better correlated with  disease progression.  CAT has a scoring range of zero to 40. The CAT score is classified into four groups of low (less than 10), medium (10 - 20), high (21-30) and very high (31-40) based on the impact level of disease on health status. A CAT score over 10 suggests significant symptoms.  A worsening CAT score could be explained by an exacerbation, poor medication adherence, poor inhaler technique, or progression  of COPD or comorbid conditions.  CAT MCID is 2 points  mMRC: mMRC (Modified Medical Research Council) Dyspnea Scale is used to assess the degree of baseline functional disability in patients of respiratory disease due to dyspnea. No minimal important difference is established. A decrease in score of 1 point or greater is considered a positive change.   Pulmonary Function Assessment:  Pulmonary Function Assessment - 09/13/22 1013       Breath   Bilateral Breath Sounds Clear   Expiratory wheezes with exertion   Shortness of Breath Yes;Limiting activity;Panic with Shortness of Breath             Exercise Target Goals: Exercise Program Goal: Individual exercise prescription set using results from initial 6 min walk test and THRR while considering  patient's activity barriers and safety.   Exercise Prescription Goal: Initial exercise prescription builds to 30-45 minutes a day of aerobic activity, 2-3 days per week.  Home exercise guidelines will be given to patient during program as part of exercise prescription that the participant will acknowledge.  Activity Barriers & Risk Stratification:  Activity Barriers & Cardiac Risk Stratification - 09/13/22 1008       Activity Barriers & Cardiac Risk Stratification   Activity Barriers Left Hip Replacement;Right Hip Replacement;Shortness of Breath;Muscular Weakness;Deconditioning;Balance Concerns             6 Minute Walk:  6 Minute Walk     Row Name 09/13/22 1147         6 Minute Walk   Phase Initial     Distance 544  feet     Walk Time 6 minutes     # of Rest Breaks 1  5:07-6:00     MPH 1.03     METS 1.5     RPE 17     Perceived Dyspnea  3     VO2 Peak 5.24     Symptoms Yes (comment)     Comments Pt had to stop and rest at 5 min with accessory muscle use and sats dropping to 79%. O2 was palced @ 2L with sats recovering to 94% after O2 placed.     Resting HR 69 bpm     Resting BP 140/68     Resting Oxygen Saturation  94 %     Exercise Oxygen Saturation  during 6 min walk 79 %     Max Ex. HR 96 bpm     Max Ex. BP 154/70     2 Minute Post BP 138/70       Interval HR   1 Minute HR 79     2 Minute HR 84     3 Minute HR 88     4 Minute HR 96     5 Minute HR 86     6 Minute HR 89     2 Minute Post HR 77     Interval Heart Rate? Yes       Interval Oxygen   Baseline Oxygen Saturation % 94 %     1 Minute Oxygen Saturation % 94 %     1 Minute Liters of Oxygen 0 L     2 Minute Oxygen Saturation % 95 %     2 Minute Liters of Oxygen 0 L     3 Minute Oxygen Saturation % 92 %     3 Minute Liters of Oxygen 0 L     4 Minute Oxygen Saturation % 95 %  4 Minute Liters of Oxygen 0 L     5 Minute Oxygen Saturation % 79 %  placed pt on 2L     5 Minute Liters of Oxygen 0 L     6 Minute Oxygen Saturation % 94 %     6 Minute Liters of Oxygen 2 L     2 Minute Post Oxygen Saturation % 96 %     2 Minute Post Liters of Oxygen 0 L              Oxygen Initial Assessment:  Oxygen Initial Assessment - 09/13/22 1012       Home Oxygen   Home Oxygen Device None    Sleep Oxygen Prescription None    Home Exercise Oxygen Prescription None    Home Resting Oxygen Prescription None      Initial 6 min Walk   Oxygen Used Continuous    Liters per minute 2      Program Oxygen Prescription   Program Oxygen Prescription Continuous    Liters per minute 2    Comments During walk test on RA, pt desaturated down to 79%. Placed on 2L [place].      Intervention   Short Term Goals To learn and exhibit compliance with  exercise, home and travel O2 prescription;To learn and understand importance of maintaining oxygen saturations>88%;To learn and demonstrate proper use of respiratory medications;To learn and understand importance of monitoring SPO2 with pulse oximeter and demonstrate accurate use of the pulse oximeter.;To learn and demonstrate proper pursed lip breathing techniques or other breathing techniques.     Long  Term Goals Exhibits compliance with exercise, home  and travel O2 prescription;Maintenance of O2 saturations>88%;Compliance with respiratory medication;Verbalizes importance of monitoring SPO2 with pulse oximeter and return demonstration;Exhibits proper breathing techniques, such as pursed lip breathing or other method taught during program session;Demonstrates proper use of MDI's             Oxygen Re-Evaluation:  Oxygen Re-Evaluation     Row Name 09/13/22 1012 09/16/22 0908 10/16/22 1155         Program Oxygen Prescription   Program Oxygen Prescription -- Continuous Continuous     Liters per minute -- 2 2       Home Oxygen   Home Oxygen Device -- None None     Sleep Oxygen Prescription -- None None     Home Exercise Oxygen Prescription -- None None     Home Resting Oxygen Prescription -- None None       Goals/Expected Outcomes   Short Term Goals -- To learn and exhibit compliance with exercise, home and travel O2 prescription;To learn and understand importance of maintaining oxygen saturations>88%;To learn and demonstrate proper use of respiratory medications;To learn and understand importance of monitoring SPO2 with pulse oximeter and demonstrate accurate use of the pulse oximeter.;To learn and demonstrate proper pursed lip breathing techniques or other breathing techniques.  To learn and exhibit compliance with exercise, home and travel O2 prescription;To learn and understand importance of maintaining oxygen saturations>88%;To learn and demonstrate proper use of respiratory  medications;To learn and understand importance of monitoring SPO2 with pulse oximeter and demonstrate accurate use of the pulse oximeter.;To learn and demonstrate proper pursed lip breathing techniques or other breathing techniques.      Long  Term Goals -- Exhibits compliance with exercise, home  and travel O2 prescription;Maintenance of O2 saturations>88%;Compliance with respiratory medication;Verbalizes importance of monitoring SPO2 with pulse oximeter and return demonstration;Exhibits proper breathing techniques,  such as pursed lip breathing or other method taught during program session;Demonstrates proper use of MDI's Exhibits compliance with exercise, home  and travel O2 prescription;Maintenance of O2 saturations>88%;Compliance with respiratory medication;Verbalizes importance of monitoring SPO2 with pulse oximeter and return demonstration;Exhibits proper breathing techniques, such as pursed lip breathing or other method taught during program session;Demonstrates proper use of MDI's     Comments [facility]2 --     Goals/Expected Outcomes Compliance with home oxygen when obtained. Compliance and understanding of oxygen saturation monitoring and breathing techniques to decrease shortness of breath. Compliance and understanding of oxygen saturation monitoring and breathing techniques to decrease shortness of breath.              Oxygen Discharge (Final Oxygen Re-Evaluation):  Oxygen Re-Evaluation - 10/16/22 1155       Program Oxygen Prescription   Program Oxygen Prescription Continuous    Liters per minute 2      Home Oxygen   Home Oxygen Device None    Sleep Oxygen Prescription None    Home Exercise Oxygen Prescription None    Home Resting Oxygen Prescription None      Goals/Expected Outcomes   Short Term Goals To learn and exhibit compliance with exercise, home and travel O2 prescription;To learn and understand importance of  maintaining oxygen saturations>88%;To learn and demonstrate proper use of respiratory medications;To learn and understand importance of monitoring SPO2 with pulse oximeter and demonstrate accurate use of the pulse oximeter.;To learn and demonstrate proper pursed lip breathing techniques or other breathing techniques.     Long  Term Goals Exhibits compliance with exercise, home  and travel O2 prescription;Maintenance of O2 saturations>88%;Compliance with respiratory medication;Verbalizes importance of monitoring SPO2 with pulse oximeter and return demonstration;Exhibits proper breathing techniques, such as pursed lip breathing or other method taught during program session;Demonstrates proper use of MDI's    Goals/Expected Outcomes Compliance and understanding of oxygen saturation monitoring and breathing techniques to decrease shortness of breath.             Initial Exercise Prescription:  Initial Exercise Prescription - 09/13/22 1100       Date of Initial Exercise RX and Referring Provider   Date 09/13/22    [facility]erring [name] coverage)    Expected Discharge Date 12/10/22      Oxygen   Oxygen Intermittent    Liters 2    Maintain Oxygen Saturation 88% or higher      NuStep   Level 1    SPM 60    Minutes 30      Prescription Details   Frequency (times per week) 2    Duration Progress to 30 minutes of continuous aerobic without signs/symptoms of physical distress      Intensity   THRR 40-80% of Max Heartrate 59-118    Ratings of Perceived Exertion 11-13    Perceived Dyspnea 0-4      Progression   Progression Continue to progress workloads to maintain intensity without signs/symptoms of physical distress.      Resistance Training   Training Prescription Yes    Reps 10-15             Perform Capillary Blood Glucose checks as needed.  Exercise Prescription Changes:   Exercise Prescription Changes     Row Name 09/24/22 1200 10/08/22 1200 10/22/22 1200          Response to Exercise   Blood Pressure (  Admit) 128/60 122/58 114/62     Blood Pressure (Exercise) 110/78 124/66 130/70     Blood Pressure (Exit) 120/70 122/74 122/64     Heart Rate (Admit) 81 bpm 69 bpm 92 bpm     Heart Rate (Exercise) 87 bpm 92 bpm 101 bpm     Heart Rate (Exit) 88 bpm 73 bpm 78 bpm     Oxygen Saturation (Admit) 94 % 94 % 95 %     Oxygen Saturation (Exercise) 94 % 94 % 94 %     Oxygen Saturation (Exit) 95 % 94 % 96 %     Rating of Perceived Exertion (Exercise) 13 13 11      Perceived Dyspnea (Exercise) 1 2 1      Duration Continue with 30 min of aerobic exercise without signs/symptoms of physical distress. Continue with 30 min of aerobic exercise without signs/symptoms of physical distress. Continue with 30 min of aerobic exercise without signs/symptoms of physical distress.     Intensity THRR unchanged THRR unchanged THRR unchanged       Progression   Progression Continue to progress workloads to maintain intensity without signs/symptoms of physical distress. Continue to progress workloads to maintain intensity without signs/symptoms of physical distress. Continue to progress workloads to maintain intensity without signs/symptoms of physical distress.       Resistance Training   Training Prescription Yes Yes Yes     Weight red bands red bands red bands     Reps 10-15 10-15 10-15     Time 10 Minutes 10 Minutes 10 Minutes       Oxygen   Oxygen Continuous Continuous Continuous     Liters 2 2 2        NuStep   Level 1 2 2      SPM 60 60 --     Minutes 30 15 15      METs 1.8 1.9 2       Track   Laps -- 8 11     Minutes -- 15 15     METs -- 2.23 2.69       Oxygen   Maintain Oxygen Saturation 88% or higher 88% or higher 88% or higher              Exercise Comments:   Exercise Comments     Row Name 09/19/22 1217           Exercise Comments Pt completed first day of group exercise. He exercised 30 min on the recumbent bike at level 1, METs 1.7.  tolerated well. Pt performed warm up and cool down following demonstrative and verbal cues, including squats. Discussed METs with good reception. will progress as tolerated.                Exercise Goals and Review:   Exercise Goals     Row Name 09/13/22 1011 09/16/22 0906 10/16/22 1147         Exercise Goals   Increase Physical Activity Yes Yes Yes     Intervention Provide advice, education, support and counseling about physical activity/exercise needs.;Develop an individualized exercise prescription for aerobic and resistive training based on initial evaluation findings, risk stratification, comorbidities and participant's personal goals. Provide advice, education, support and counseling about physical activity/exercise needs.;Develop an individualized exercise prescription for aerobic and resistive training based on initial evaluation findings, risk stratification, comorbidities and participant's personal goals. Provide advice, education, support and counseling about physical activity/exercise needs.;Develop an individualized exercise prescription for aerobic and resistive training based  on initial evaluation findings, risk stratification, comorbidities and participant's personal goals.     Expected Outcomes Long Term: Add in home exercise to make exercise part of routine and to increase amount of physical activity.;Short Term: Attend rehab on a regular basis to increase amount of physical activity.;Long Term: Exercising regularly at least 3-5 days a week. Long Term: Add in home exercise to make exercise part of routine and to increase amount of physical activity.;Short Term: Attend rehab on a regular basis to increase amount of physical activity.;Long Term: Exercising regularly at least 3-5 days a week. Long Term: Add in home exercise to make exercise part of routine and to increase amount of physical activity.;Short Term: Attend rehab on a regular basis to increase amount of physical  activity.;Long Term: Exercising regularly at least 3-5 days a week.     Increase Strength and Stamina Yes Yes Yes     Intervention Provide advice, education, support and counseling about physical activity/exercise needs.;Develop an individualized exercise prescription for aerobic and resistive training based on initial evaluation findings, risk stratification, comorbidities and participant's personal goals. Provide advice, education, support and counseling about physical activity/exercise needs.;Develop an individualized exercise prescription for aerobic and resistive training based on initial evaluation findings, risk stratification, comorbidities and participant's personal goals. Provide advice, education, support and counseling about physical activity/exercise needs.;Develop an individualized exercise prescription for aerobic and resistive training based on initial evaluation findings, risk stratification, comorbidities and participant's personal goals.     Expected Outcomes Short Term: Increase workloads from initial exercise prescription for resistance, speed, and METs.;Short Term: Perform resistance training exercises routinely during rehab and add in resistance training at home;Long Term: Improve cardiorespiratory fitness, muscular endurance and strength as measured by increased METs and functional capacity ( ) Short Term: Increase workloads from initial exercise prescription for resistance, speed, and METs.;Short Term: Perform resistance training exercises routinely during rehab and add in resistance training at home;Long Term: Improve cardiorespiratory fitness, muscular endurance and strength as measured by increased METs and functional capacity ( ) Short Term: Increase workloads from initial exercise prescription for resistance, speed, and METs.;Short Term: Perform resistance training exercises routinely during rehab and add in resistance training at home;Long Term: Improve cardiorespiratory  fitness, muscular endurance and strength as measured by increased METs and functional capacity ( )     Able to understand and use rate of perceived exertion (RPE) scale Yes Yes Yes     Intervention Provide education and explanation on how to use RPE scale Provide education and explanation on how to use RPE scale Provide education and explanation on how to use RPE scale     Expected Outcomes Short Term: Able to use RPE daily in rehab to express subjective intensity level;Long Term:  Able to use RPE to guide intensity level when exercising independently Short Term: Able to use RPE daily in rehab to express subjective intensity level;Long Term:  Able to use RPE to guide intensity level when exercising independently Short Term: Able to use RPE daily in rehab to express subjective intensity level;Long Term:  Able to use RPE to guide intensity level when exercising independently     Able to understand and use Dyspnea scale Yes Yes Yes     Intervention Provide education and explanation on how to use Dyspnea scale Provide education and explanation on how to use Dyspnea scale Provide education and explanation on how to use Dyspnea scale     Expected Outcomes Short Term: Able to use Dyspnea scale daily in rehab to express subjective  sense of shortness of breath during exertion;Long Term: Able to use Dyspnea scale to guide intensity level when exercising independently Short Term: Able to use Dyspnea scale daily in rehab to express subjective sense of shortness of breath during exertion;Long Term: Able to use Dyspnea scale to guide intensity level when exercising independently Short Term: Able to use Dyspnea scale daily in rehab to express subjective sense of shortness of breath during exertion;Long Term: Able to use Dyspnea scale to guide intensity level when exercising independently     Knowledge and understanding of Target Heart Rate Range (THRR) Yes Yes Yes     Intervention Provide education and explanation of  THRR including how the numbers were predicted and where they are located for reference Provide education and explanation of THRR including how the numbers were predicted and where they are located for reference Provide education and explanation of THRR including how the numbers were predicted and where they are located for reference     Expected Outcomes Short Term: Able to state/look up THRR;Short Term: Able to use daily as guideline for intensity in rehab;Long Term: Able to use THRR to govern intensity when exercising independently Short Term: Able to state/look up THRR;Short Term: Able to use daily as guideline for intensity in rehab;Long Term: Able to use THRR to govern intensity when exercising independently Short Term: Able to state/look up THRR;Short Term: Able to use daily as guideline for intensity in rehab;Long Term: Able to use THRR to govern intensity when exercising independently     Understanding of Exercise Prescription Yes Yes Yes     Intervention Provide education, explanation, and written materials on patient's individual exercise prescription Provide education, explanation, and written materials on patient's individual exercise prescription Provide education, explanation, and written materials on patient's individual exercise prescription     Expected Outcomes Short Term: Able to explain program exercise prescription;Long Term: Able to explain home exercise prescription to exercise independently Short Term: Able to explain program exercise prescription;Long Term: Able to explain home exercise prescription to exercise independently Short Term: Able to explain program exercise prescription;Long Term: Able to explain home exercise prescription to exercise independently              Exercise Goals Re-Evaluation :  Exercise Goals Re-Evaluation     Row Name 09/16/22 0906 10/16/22 1147           Exercise Goal Re-Evaluation   Exercise Goals Review Increase Physical Activity;Able to  understand and use Dyspnea scale;Understanding of Exercise Prescription;Increase Strength and Stamina;Knowledge and understanding of Target Heart Rate Range (THRR);Able to understand and use rate of perceived exertion (RPE) scale Increase Physical Activity;Able to understand and use Dyspnea scale;Understanding of Exercise Prescription;Increase Strength and Stamina;Knowledge and understanding of Target Heart Rate Range (THRR);Able to understand and use rate of perceived exertion (RPE) scale      Comments Pt is scheduled to start exercise this week. Will continue to monitor and progress as able. Erek has completed 8 exercise sessions. He exercises for 15 min on the track and Nustep. Kaelen averages 2.54 METs on the track and 1.9 METs at level 2 on the Nustep. He performs the warmup and cooldown standing without limitations. Mars has slightly increased his track laps and METs. Javyon is deconditioning and somewhat difficult to progress. Will continue to monitor and progress able.      Expected Outcomes Through exercise at rehab and home, the patient will decrease shortness of breath with daily activities and feel confident in carrying out an  exercise regimen at home. Through exercise at rehab and home, the patient will decrease shortness of breath with daily activities and feel confident in carrying out an exercise regimen at home.               Discharge Exercise Prescription (Final Exercise Prescription Changes):  Exercise Prescription Changes - 10/22/22 1200       Response to Exercise   Blood Pressure (Admit) 114/62    Blood Pressure (Exercise) 130/70    Blood Pressure (Exit) 122/64    Heart Rate (Admit) 92 bpm    Heart Rate (Exercise) 101 bpm    Heart Rate (Exit) 78 bpm    Oxygen Saturation (Admit) 95 %    Oxygen Saturation (Exercise) 94 %    Oxygen Saturation (Exit) 96 %    Rating of Perceived Exertion (Exercise) 11    Perceived Dyspnea (Exercise) 1    Duration Continue with 30 min of  aerobic exercise without signs/symptoms of physical distress.    Intensity THRR unchanged      Progression   Progression Continue to progress workloads to maintain intensity without signs/symptoms of physical distress.      Resistance Training   Training Prescription Yes    Weight red bands    Reps 10-15    Time 10 Minutes      Oxygen   Oxygen Continuous    Liters 2      NuStep   Level 2    Minutes 15    METs 2      Track   Laps 11    Minutes 15    METs 2.69      Oxygen   Maintain Oxygen Saturation 88% or higher             Nutrition:  Target Goals: Understanding of nutrition guidelines, daily intake of sodium 1500mg , cholesterol 200mg , calories 30% from fat and 7% or less from saturated fats, daily to have 5 or more servings of fruits and vegetables.  Biometrics:  Pre Biometrics - 09/13/22 0955       Pre Biometrics   Grip Strength 40 kg              Nutrition Therapy Plan and Nutrition Goals:  Nutrition Therapy & Goals - 10/17/22 1448       Nutrition Therapy   Diet Heart  Healthy Diet      Personal Nutrition Goals   Nutrition Goal Patient to improve diet quality by using the plate method as a guide for meal planning to include lean protein/plant protein, fruits, vegetables, whole grains, nonfat dairy as part of a well-balanced diet.    Comments Raja reports stable appetite and standard eating patterns. He lives alone and does his own grocery shopping and cooking. He is currently working on smoking cessation. Damaria will benefit from participation in pulmonary rehab for nutrition and exercise support.      Intervention Plan   Intervention Prescribe, educate and counsel regarding individualized specific dietary modifications aiming towards targeted core components such as weight, hypertension, lipid management, diabetes, heart failure and other comorbidities.;Nutrition handout(s) given to patient.    Expected Outcomes Short Term Goal: Understand basic  principles of dietary content, such as calories, fat, sodium, cholesterol and nutrients.;Long Term Goal: Adherence to prescribed nutrition plan.             Nutrition Assessments:  MEDIFICTS Score Key: ?70 Need to make dietary changes  40-70 Heart Healthy Diet ? 40 Therapeutic Level Cholesterol Diet  Picture Your Plate Scores: <82 Unhealthy dietary pattern with much room for improvement. 41-50 Dietary pattern unlikely to meet recommendations for good health and room for improvement. 51-60 More healthful dietary pattern, with some room for improvement.  >60 Healthy dietary pattern, although there may be some specific behaviors that could be improved.    Nutrition Goals Re-Evaluation:  Nutrition Goals Re-Evaluation     Row Name 09/19/22 1156 10/17/22 1448           Goals   Current Weight 189 lb 6 oz (85.9 kg) 183 lb 13.8 oz (83.4 kg)      Comment A1c 6.1, cholesterol 226, triglycerides 185, HDL 39, LDL 150 No new labs; most recent labs A1c 6.1, cholesterol 226, triglycerides 185, HDL 39, LDL 150. He is down 4.2# since starting with our program.      Expected Outcome Jareth reports stable appetite and standard eating patterns. He lives alone and does his own grocery shopping and cooking. He is currently working on smoking cessation. Jeriel will benefit from participation in pulmonary rehab for nutrition and exercise support. Sabino reports stable appetite and standard eating patterns. He lives alone and does his own grocery shopping and cooking. He is currently working on smoking cessation. Holden will benefit from participation in pulmonary rehab for nutrition and exercise support.               Nutrition Goals Discharge (Final Nutrition Goals Re-Evaluation):  Nutrition Goals Re-Evaluation - 10/17/22 1448       Goals   Current Weight 183 lb 13.8 oz (83.4 kg)    Comment No new labs; most recent labs A1c 6.1, cholesterol 226, triglycerides 185, HDL 39, LDL 150. He is down  4.2# since starting with our program.    Expected Outcome Macon reports stable appetite and standard eating patterns. He lives alone and does his own grocery shopping and cooking. He is currently working on smoking cessation. Bernarr will benefit from participation in pulmonary rehab for nutrition and exercise support.             Psychosocial: Target Goals: Acknowledge presence or absence of significant depression and/or stress, maximize coping skills, provide positive support system. Participant is able to verbalize types and ability to use techniques and skills needed for reducing stress and depression.  Initial Review & Psychosocial Screening:  Initial Psych Review & Screening - 09/13/22 1014       Initial Review   Current issues with Current Stress Concerns    Source of Stress Concerns Family;Financial;Unable to participate in former interests or hobbies    Comments Pt is currently concerned about not having a relationship with his 2 children, feels ashamed of smoking, and has little interest in doing things, grumpy about his [facility] care, [facility], and getting prescriptions in a timely manner      Family Dynamics   Good Support System? Yes    Comments Friends from living facility      Barriers   Psychosocial barriers to participate in program The patient should benefit from training in stress management and relaxation.;Psychosocial barriers identified (see note)      Screening Interventions   Interventions Encouraged to exercise;Provide feedback about the scores to participant;To provide support and resources with identified psychosocial needs    Expected Outcomes Long Term Goal: Stressors or current issues are controlled or eliminated.;Short Term goal: Identification and review with participant of any Quality of Life or Depression concerns found by scoring the questionnaire.;Long Term goal: The participant improves  quality of Life and PHQ9 Scores as seen by post scores and/or  verbalization of changes;Short Term goal: Utilizing psychosocial counselor, staff and physician to assist with identification of specific Stressors or current issues interfering with healing process. Setting desired goal for each stressor or current issue identified.   Pt scored high on PHQ2/9. Pt denies current depression although speaking w/patient there are possible signs. Pt currently declines to speak to a therapist & making an appt for follow up with his PCP. We will continue to assess and monitor his needs.            Quality of Life Scores:  Scores of 19 and below usually indicate a poorer quality of life in these areas.  A difference of  2-3 points is a clinically meaningful difference.  A difference of 2-3 points in the total score of the Quality of Life Index has been associated with significant improvement in overall quality of life, self-image, physical symptoms, and general health in studies assessing change in quality of life.  PHQ-9: Review Flowsheet       09/13/2022 04/24/2018 07/02/2017 03/13/2017  Depression screen PHQ 2/9  Decreased Interest 1 0 1 0 0  Down, Depressed, Hopeless 1 0 1 0 0  PHQ - 2 Score 2 0 2 0 0  Altered sleeping 1 0 0 - -  Tired, decreased energy 2 3 3  - -  Change in appetite 0 0 0 - -  Feeling bad or failure about yourself  0 2 1 - -  Trouble concentrating 2 1 1  - -  Moving slowly or fidgety/restless 0 0 0 - -  Suicidal thoughts 0 0 0 - -  PHQ-9 Score 7 6 7  - -  Difficult doing work/chores Very difficult Very difficult - -   Interpretation of Total Score  Total Score Depression Severity:  1-4 = Minimal depression, 5-9 = Mild depression, 10-14 = Moderate depression, 15-19 = Moderately severe depression, 20-27 = Severe depression   Psychosocial Evaluation and Intervention:  Psychosocial Evaluation - 09/13/22 1019       Psychosocial Evaluation & Interventions   Interventions Stress management education;Encouraged to exercise with the program and  follow exercise prescription    Comments Pt is currently concerned about not having a relationship with his 2 children, feels ashamed of still smoking, has low self worth, little interest in doing things, and  has a grumpy disposition due to his medical care, [facility], and getting prescriptions in a timely manner    Expected Outcomes Pt will report and employ positive and healthy techniques for stress management. Currently declines referral to his PCP and a therapist    Continue Psychosocial Services  Follow up required by staff   We will continue to monitor and assess            Psychosocial Re-Evaluation:  Psychosocial Re-Evaluation     Row Name 09/16/22 0948 10/14/22 1515           Psychosocial Re-Evaluation   Current issues with Current Stress Concerns Current Stress Concerns      Comments No change since assessment on 09/13/2022. Ming currently states that his stress level is lower than before. He states that he likes having extra resources available to him through Pulmonary Rehab. Staff was able to get Kristofor home oxygen and he states he is "breathing better". Donald states that his ex-wife has been supportive and he's feeling better since his last exacerbation. Doil has cut back his smoking and looks forward  to quitting for good. He states nothing has changed with the relationship with his children nor the finances. He states his rent and prices in general are increasing. Allen declined resources on food and housing.      Expected Outcomes For Myka to attend PR without any psychosocial barriers or concerns. For Nimrod to attend PR without any psychosocial barriers or concerns and to use positive self-care techniques to manage his stress.      Interventions Encouraged to attend Pulmonary Rehabilitation for the exercise;Stress management education Encouraged to attend Pulmonary Rehabilitation for the exercise;Stress management education      Continue Psychosocial Services  Follow up required by  staff Follow up required by staff               Psychosocial Discharge (Final Psychosocial Re-Evaluation):  Psychosocial Re-Evaluation - 10/14/22 1515       Psychosocial Re-Evaluation   Current issues with Current Stress Concerns    Comments Willoughby currently states that his stress level is lower than before. He states that he likes having extra resources available to him through Pulmonary Rehab. Staff was able to get Shakur home oxygen and he states he is "breathing better". Elkin states that his ex-wife has been supportive and he's feeling better since his last exacerbation. Deshannon has cut back his smoking and looks forward to quitting for good. He states nothing has changed with the relationship with his children nor the finances. He states his rent and prices in general are increasing. Harutyun declined resources on food and housing.    Expected Outcomes For Daemon to attend PR without any psychosocial barriers or concerns and to use positive self-care techniques to manage his stress.    Interventions Encouraged to attend Pulmonary Rehabilitation for the exercise;Stress management education    Continue Psychosocial Services  Follow up required by staff             Education: Education Goals: Education classes will be provided on a weekly basis, covering required topics. Participant will state understanding/return demonstration of topics presented.  Learning Barriers/Preferences:  Learning Barriers/Preferences - 09/13/22 1153       Learning Barriers/Preferences   Learning Barriers Sight;Hearing    Learning Preferences Group Instruction;Individual Instruction;Verbal Instruction;Written Material;Skilled Demonstration             Education Topics: Introduction to Pulmonary Rehab Group instruction provided by PowerPoint, verbal discussion, and written material to support subject matter. Instructor reviews what Pulmonary Rehab is, the purpose of the program, and how patients are  referred.     Know Your Numbers Group instruction that is supported by a PowerPoint presentation. Instructor discusses importance of knowing and understanding resting, exercise, and post-exercise oxygen saturation, heart rate, and blood pressure. Oxygen saturation, heart rate, blood pressure, rating of perceived exertion, and dyspnea are reviewed along with a normal range for these values.  Flowsheet Row PULMONARY REHAB OTHER RESPIRATORY from 09/19/2022 in [facility]  Date 09/19/22  Educator EP  Instruction Review Code 1- Verbalizes Understanding       Exercise for the Pulmonary Patient Group instruction that is supported by a PowerPoint presentation. Instructor discusses benefits of exercise, core components of exercise, frequency, duration, and intensity of an exercise routine, importance of utilizing pulse oximetry during exercise, safety while exercising, and options of places to exercise outside of rehab.  Flowsheet Row PULMONARY REHAB CHRONIC OBSTRUCTIVE PULMONARY DISEASE from 08/06/2018 in [facility] for [facility], Vascular, &  Lung [facility]  Date 07/30/18  Educator Kirt Boys  Instruction Review Code 1- Verbalizes Understanding          MET Level  Group instruction provided by PowerPoint, verbal discussion, and written material to support subject matter. Instructor reviews what METs are and how to increase METs.  Flowsheet Row PULMONARY REHAB OTHER RESPIRATORY from 10/10/2022 in [facility], V[facility], & [facility]  Date 10/10/22  Educator EP  Instruction Review Code 1- Verbalizes Understanding       Pulmonary Medications Verbally interactive group education provided by instructor with focus on inhaled medications and proper administration. Flowsheet Row PULMONARY REHAB CHRONIC OBSTRUCTIVE PULMONARY DISEASE from 08/06/2018 in [facility] for  Heart, Vascular, & Lung Health  Date 06/30/18  Educator Victorino Dike  Instruction Review Code 1- Verbalizes Understanding       Anatomy and Physiology of the Respiratory System Group instruction provided by PowerPoint, verbal discussion, and written material to support subject matter. Instructor reviews respiratory cycle and anatomical components of the respiratory system and their functions. Instructor also reviews differences in obstructive and restrictive respiratory diseases with examples of each.  Flowsheet Row PULMONARY REHAB CHRONIC OBSTRUCTIVE PULMONARY DISEASE from 08/06/2018 in [facility] for [facility], Vascular, & Lung [facility]  Date 07/09/18  Educator RN  Instruction Review Code 1- Verbalizes Understanding       Oxygen Safety Group instruction provided by PowerPoint, verbal discussion, and written material to support subject matter. There is an overview of "What is Oxygen" and "Why do we need it".  Instructor also reviews how to create a safe environment for oxygen use, the importance of using oxygen as prescribed, and the risks of noncompliance. There is a brief discussion on traveling with oxygen and resources the patient may utilize. Flowsheet Row PULMONARY REHAB OTHER RESPIRATORY from 09/26/2022 in [facility] for Heart, Vascular, & Lung Health  Date 09/26/22  Educator EP  Instruction Review Code 1- Verbalizes Understanding       Oxygen Use Group instruction provided by PowerPoint, verbal discussion, and written material to discuss how supplemental oxygen is prescribed and different types of oxygen supply systems. Resources for more information are provided.  Flowsheet Row PULMONARY REHAB OTHER RESPIRATORY from 10/03/2022 in [facility] for Heart, Vascular, & Lung Health  Date 10/03/22  Educator RT  Instruction Review Code 1- Verbalizes Understanding       Breathing Techniques Group instruction that is  supported by demonstration and informational handouts. Instructor discusses the benefits of pursed lip and diaphragmatic breathing and detailed demonstration on how to perform both.  Flowsheet Row PULMONARY REHAB OTHER RESPIRATORY from 10/17/2022 in [facility] for Heart, Vascular, & Lung Health  Date 10/17/22  Educator RT  Instruction Review Code 1- Verbalizes Understanding        Risk Factor Reduction Group instruction that is supported by a PowerPoint presentation. Instructor discusses the definition of a risk factor, different risk factors for pulmonary disease, and how the heart and lungs work together.   MD Day A group question and answer session with a medical doctor that allows participants to ask questions that relate to their pulmonary disease state.   Nutrition for the Pulmonary Patient Group instruction provided by PowerPoint slides, verbal discussion, and written materials to support subject matter. The instructor gives an explanation and review of healthy diet recommendations, which includes a discussion on weight management, recommendations for fruit and vegetable  consumption, as well as protein, fluid, caffeine, fiber, sodium, sugar, and alcohol. Tips for eating when patients are short of breath are discussed. Flowsheet Row PULMONARY REHAB CHRONIC OBSTRUCTIVE PULMONARY DISEASE from 08/06/2018 in [facility] for Heart, Vascular, & Lung Health  Date 06/18/18  Educator Danne Harbor  Instruction Review Code 1- Verbalizes Understanding        Other Education Group or individual verbal, written, or video instructions that support the educational goals of the pulmonary rehab program. Flowsheet Row PULMONARY REHAB CHRONIC OBSTRUCTIVE PULMONARY DISEASE from 08/06/2018 in [facility] for Heart, Vascular, & Lung Health  Date 06/25/18  Vertell Novak to hand weights]  Educator Dalton  Instruction Review Code 1- Verbalizes  Understanding        Knowledge Questionnaire Score:  Knowledge Questionnaire Score - 09/13/22 1153       Knowledge Questionnaire Score   Pre Score 12/18             Core Components/Risk Factors/Patient Goals at Admission:  Personal Goals and Risk Factors at Admission - 09/13/22 1019       Core Components/Risk Factors/Patient Goals on Admission    Weight Management Yes;Weight Loss    Admit Weight 188 lb (85.3 kg)    Goal Weight: Short Term 175 lb (79.4 kg)    Expected Outcomes Short Term: Continue to assess and modify interventions until short term weight is achieved;Long Term: Adherence to nutrition and physical activity/exercise program aimed toward attainment of established weight goal;Weight Maintenance: Understanding of the daily nutrition guidelines, which includes 25-35% calories from fat, 7% or less cal from saturated fats, less than 200mg  cholesterol, less than 1.5gm of sodium, & 5 or more servings of fruits and vegetables daily;Weight Loss: Understanding of general recommendations for a balanced deficit meal plan, which promotes 1-2 lb weight loss per week and includes a negative energy balance of [phone] kcal/d;Understanding recommendations for meals to include 15-35% energy as protein, 25-35% energy from fat, 35-60% energy from carbohydrates, less than 200mg  of dietary cholesterol, 20-35 gm of total fiber daily;Understanding of distribution of calorie intake throughout the day with the consumption of 4-5 meals/snacks    Tobacco Cessation Yes    Number of packs per day 1-3 cigs/day    Intervention Assist the participant in steps to quit. Provide individualized education and counseling about committing to Tobacco Cessation, relapse prevention, and pharmacological support that can be provided by physician.;Education officer, environmental, assist with locating and accessing local/national Quit Smoking programs, and support quit date choice.    Expected Outcomes Short Term: Will  demonstrate readiness to quit, by selecting a quit date.;Long Term: Complete abstinence from all tobacco products for at least 12 months from quit date.;Short Term: Will quit all tobacco product use, adhering to prevention of relapse plan.    Improve shortness of breath with ADL's Yes    Intervention Provide education, individualized exercise plan and daily activity instruction to help decrease symptoms of SOB with activities of daily living.    Expected Outcomes Short Term: Improve cardiorespiratory fitness to achieve a reduction of symptoms when performing ADLs;Long Term: Be able to perform more ADLs without symptoms or delay the onset of symptoms    Increase knowledge of respiratory medications and ability to use respiratory devices properly  Yes    Intervention Provide education and demonstration as needed of appropriate use of medications, inhalers, and oxygen therapy.    Expected Outcomes Short Term: Achieves understanding of medications use. Understands that oxygen is a  medication prescribed by physician. Demonstrates appropriate use of inhaler and oxygen therapy.;Long Term: Maintain appropriate use of medications, inhalers, and oxygen therapy.    Stress Yes    Intervention Offer individual and/or small group education and counseling on adjustment to heart disease, stress management and health-related lifestyle change. Teach and support self-help strategies.;Refer participants experiencing significant psychosocial distress to appropriate mental health specialists for further evaluation and treatment. When possible, include family members and significant others in education/counseling sessions.    Expected Outcomes Short Term: Participant demonstrates changes in health-related behavior, relaxation and other stress management skills, ability to obtain effective social support, and compliance with psychotropic medications if prescribed.;Long Term: Emotional wellbeing is indicated by absence of  clinically significant psychosocial distress or social isolation.             Core Components/Risk Factors/Patient Goals Review:   Goals and Risk Factor Review     Row Name 09/16/22 0951 10/14/22 1520           Core Components/Risk Factors/Patient Goals Review   Personal Goals Review Weight Management/Obesity;Tobacco Cessation;Improve shortness of breath with ADL's;Develop more efficient breathing techniques such as purse lipped breathing and diaphragmatic breathing and practicing self-pacing with activity.;Increase knowledge of respiratory medications and ability to use respiratory devices properly.;Stress Weight Management/Obesity;Tobacco Cessation;Improve shortness of breath with ADL's;Develop more efficient breathing techniques such as purse lipped breathing and diaphragmatic breathing and practicing self-pacing with activity.;Increase knowledge of respiratory medications and ability to use respiratory devices properly.;Stress      Review No changes since orientation visit on 09/13/2022. Durwood is scheduled to start his first session on 09/19/2022. Jaden has attended 3 weeks of Pulmonary Rehab. His weight has been stable. He is working with our dietician to lose weight. (See dietitian note). Keston is also working toward smoking cessation. He has cut back on how many cigarettes he smokes daily. He has met his goal on correctly stating when to use his inhaler and has properly demonstrated it with our respiratory therapist. He has also met his goal of developing more efficient breathing techniques and self-pacing. Grigor can initiate pursed lip breathing when he is short of breath without staff involvement. He is still working on improving his shortness of breath but feels like PR is helping.      Expected Outcomes Unable to evaluate any progress towards his goals yet. Duance will start the program on 09/19/2022. See admission goals               Core Components/Risk Factors/Patient Goals at  Discharge (Final Review):   Goals and Risk Factor Review - 10/14/22 1520       Core Components/Risk Factors/Patient Goals Review   Personal Goals Review Weight Management/Obesity;Tobacco Cessation;Improve shortness of breath with ADL's;Develop more efficient breathing techniques such as purse lipped breathing and diaphragmatic breathing and practicing self-pacing with activity.;Increase knowledge of respiratory medications and ability to use respiratory devices properly.;Stress    Review Jefry has attended 3 weeks of Pulmonary Rehab. His weight has been stable. He is working with our dietician to lose weight. (See dietitian note). King is also working toward smoking cessation. He has cut back on how many cigarettes he smokes daily. He has met his goal on correctly stating when to use his inhaler and has properly demonstrated it with our respiratory therapist. He has also met his goal of developing more efficient breathing techniques and self-pacing. Donterrius can initiate pursed lip breathing when he is short of breath without staff involvement. He is still  working on improving his shortness of breath but feels like PR is helping.    Expected Outcomes See admission goals             ITP Comments: Pt is making expected progress toward Pulmonary Rehab goals after completing 10 sessions. Recommend continued exercise, life style modification, education, and utilization of breathing techniques to increase stamina and strength, while also decreasing shortness of breath with exertion.    Comments: Dr. Mechele Collin is Medical Director for Pulmonary Rehab at [facility].

## 2022-10-24 ENCOUNTER — Encounter (HOSPITAL_COMMUNITY)
Admission: RE | Admit: 2022-10-24 | Discharge: 2022-10-24 | Disposition: A | Discharge status: Home / Self Care | Payer: No Typology Code available for payment source | Source: Ambulatory Visit | Attending: Internal Medicine | Admitting: Internal Medicine

## 2022-10-24 DIAGNOSIS — G4733 Obstructive sleep apnea (adult) (pediatric): Secondary | ICD-10-CM

## 2022-10-24 NOTE — Progress Notes (Signed)
Daily Session Note  Patient Details  Name: Steve Riley MRN: [id number] Date of Birth: July 13, 1950 Referring Provider:   [name] Pulmonary Rehab Walk Test from 09/13/2022 in [facility] for Heart, Vascular, & Lung Health  Referring Provider [name] coverage)       Encounter Date: 10/24/2022  Check In:  Session Check In - 10/24/22 1141       Check-In   Supervising physician immediately available to respond to emergencies [facility] MD immediately available    Physician(s) Eligha Bridegroom, NP    Location [facility]    Staff Present Samantha Belarus, RD, Dutch Gray, RN, BSN;Randi Reeve BS, ACSM-CEP, Exercise Physiologist;Kaylee Earlene Plater, MS, ACSM-CEP, Exercise Physiologist;Rue Tinnel Marcille Buffy, RN, BSN    Virtual Visit No    Medication changes reported     No    Fall or balance concerns reported    No    Tobacco Cessation No Change    Warm-up and Cool-down Performed as group-led instruction    Resistance Training Performed Yes    VAD Patient? No    PAD/SET Patient? No      Pain Assessment   Currently in Pain? No/denies    Multiple Pain Sites No             Capillary Blood Glucose: No results found for this or any previous visit (from the past 24 hour(s)).    Social History   Tobacco Use  Smoking Status Every Day   Packs/day: 0.25   Years: 54.00   Additional pack years: 0.00   Total pack years: 13.50   Types: Cigarettes   Last attempt to quit: 07/11/2016   Years since quitting: 6.2  Smokeless Tobacco Never  Tobacco Comments   occassionally 1-3 cigarettes per day .  Updated 09/05/2022.    Goals Met:  Proper associated with RPD/PD & O2 Sat Independence with exercise equipment Exercise tolerated well No report of concerns or symptoms today Strength training completed today  Goals Unmet:  Not Applicable  Comments: Service time is from 1017 to 1150.    Dr. Mechele Collin is Medical Director for  Pulmonary Rehab at [facility].

## 2022-10-29 ENCOUNTER — Encounter (HOSPITAL_COMMUNITY)
Admission: RE | Admit: 2022-10-29 | Discharge: 2022-10-29 | Disposition: A | Discharge status: Home / Self Care | Payer: No Typology Code available for payment source | Source: Ambulatory Visit | Attending: Internal Medicine

## 2022-10-29 DIAGNOSIS — G4733 Obstructive sleep apnea (adult) (pediatric): Secondary | ICD-10-CM | POA: Diagnosis not present

## 2022-10-29 NOTE — Progress Notes (Signed)
Daily Session Note  Patient Details  Name: Steve Riley MRN: [id number] Date of Birth: 08-03-1950 Referring Provider:   [name] Pulmonary Rehab Walk Test from 09/13/2022 in [facility] for Heart, Vascular, & Lung Health  Referring Provider [name] coverage)       Encounter Date: 10/29/2022  Check In:  Session Check In - 10/29/22 1133       Check-In   Supervising physician immediately available to respond to emergencies [facility] immediately available    Physician(s) Bernadene Person, NP    [facility]    Staff Present Samantha Belarus, RD, Dutch Gray, RN, BSN;Kaisha Wachob BS, ACSM-CEP, Exercise Physiologist;Kaylee Earlene Plater, MS, ACSM-CEP, Exercise Physiologist;Casey Katrinka Blazing, Pennelope Bracken, RN, BSN    Virtual Visit No    Medication changes reported     No    Fall or balance concerns reported    No    Tobacco Cessation No Change    Warm-up and Cool-down Performed as group-led instruction    Resistance Training Performed Yes    VAD Patient? No    PAD/SET Patient? No      Pain Assessment   Currently in Pain? No/denies    Multiple Pain Sites No             Capillary Blood Glucose: No results found for this or any previous visit (from the past 24 hour(s)).    Social History   Tobacco Use  Smoking Status Every Day   Packs/day: 0.25   Years: 54.00   Additional pack years: 0.00   Total pack years: 13.50   Types: Cigarettes   Last attempt to quit: 07/11/2016   Years since quitting: 6.3  Smokeless Tobacco Never  Tobacco Comments   occassionally 1-3 cigarettes per day .  Updated 09/05/2022.    Goals Met:  Independence with exercise equipment Exercise tolerated well No report of concerns or symptoms today Strength training completed today  Goals Unmet:  Not Applicable  Comments: Service time is from 1013 to 1141.    Dr. Mechele Collin is Medical Director for Pulmonary Rehab at [facility].

## 2022-10-31 ENCOUNTER — Encounter (HOSPITAL_COMMUNITY)
Admission: RE | Admit: 2022-10-31 | Discharge: 2022-10-31 | Disposition: A | Discharge status: Home / Self Care | Payer: No Typology Code available for payment source | Source: Ambulatory Visit | Attending: Internal Medicine | Admitting: Internal Medicine

## 2022-10-31 ENCOUNTER — Telehealth: Payer: Self-pay | Admitting: Internal Medicine

## 2022-10-31 DIAGNOSIS — G4733 Obstructive sleep apnea (adult) (pediatric): Secondary | ICD-10-CM

## 2022-10-31 NOTE — Progress Notes (Signed)
Daily Session Note  Patient Details  Name: Steve Riley MRN: [id number] Date of Birth: 04/15/1951 Referring Provider:   [name] Pulmonary Rehab Walk Test from 09/13/2022 in [facility] for Heart, Vascular, & Lung Health  Referring Provider [name] coverage)       Encounter Date: 10/31/2022  Check In:  Session Check In - 10/31/22 1259       Check-In   Supervising physician immediately available to respond to emergencies [facility] immediately available    Physician(s) Robin Searing, NP    [facility]    Staff Present Samantha Belarus, RD, Dutch Gray, RN, BSN;Randi Dionisio Paschal, ACSM-CEP, Exercise Physiologist;Carlette Les Pou, RN, Doris Cheadle, MS, ACSM-CEP, Exercise Physiologist;Yomayra Tate Katrinka Blazing, RT    Virtual Visit No    Medication changes reported     No    Fall or balance concerns reported    No    Tobacco Cessation No Change    Warm-up and Cool-down Performed as group-led instruction    Resistance Training Performed Yes    VAD Patient? No    PAD/SET Patient? No      Pain Assessment   Currently in Pain? No/denies    Multiple Pain Sites No             Capillary Blood Glucose: No results found for this or any previous visit (from the past 24 hour(s)).    Social History   Tobacco Use  Smoking Status Every Day   Packs/day: 0.25   Years: 54.00   Additional pack years: 0.00   Total pack years: 13.50   Types: Cigarettes   Last attempt to quit: 07/11/2016   Years since quitting: 6.3  Smokeless Tobacco Never  Tobacco Comments   occassionally 1-3 cigarettes per day .  Updated 09/05/2022.    Goals Met:  Proper associated with RPD/PD & O2 Sat Independence with exercise equipment Exercise tolerated well No report of concerns or symptoms today Strength training completed today  Goals Unmet:  Not Applicable  Comments: Service time is from 1010 to 1159.    Dr. Mechele Collin is Medical Director for Pulmonary  Rehab at [facility].

## 2022-10-31 NOTE — Telephone Encounter (Signed)
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 He did take his neb before class, but he felt like it didn't help. I looked at his xray from the [place] that was performed on 5/18 and it showed new consolidated opacities. Does he need to come in for an OV or can you call him in some meds? Please advise.

## 2022-11-05 ENCOUNTER — Encounter (HOSPITAL_COMMUNITY)
Admission: RE | Admit: 2022-11-05 | Discharge: 2022-11-05 | Disposition: A | Discharge status: Home / Self Care | Payer: No Typology Code available for payment source | Source: Ambulatory Visit | Attending: Internal Medicine

## 2022-11-05 VITALS — Wt 195.8 lb

## 2022-11-05 DIAGNOSIS — G4733 Obstructive sleep apnea (adult) (pediatric): Secondary | ICD-10-CM

## 2022-11-05 NOTE — Progress Notes (Signed)
Daily Session Note  Patient Details  Name: Steve Riley MRN: [id number] Date of Birth: June 28, 1950 Referring Provider:   [name] Pulmonary Rehab Walk Test from 09/13/2022 in [facility] for Heart, Vascular, & Lung Health  Referring Provider [name] coverage)       Encounter Date: 11/05/2022  Check In:  Session Check In - 11/05/22 1127       Check-In   Supervising physician immediately available to respond to emergencies [facility] immediately available    Physician(s) Robin Searing, NP    [facility]    Staff Present Samantha Belarus, RD, Dutch Gray, RN, BSN;Randi Dionisio Paschal, ACSM-CEP, Exercise Physiologist;Carlette Les Pou, RN, Doris Cheadle, MS, ACSM-CEP, Exercise Physiologist;Keelyn Monjaras Katrinka Blazing, RT    Virtual Visit No    Medication changes reported     No    Fall or balance concerns reported    No    Tobacco Cessation No Change    Warm-up and Cool-down Performed as group-led instruction    Resistance Training Performed Yes    VAD Patient? No    PAD/SET Patient? No      Pain Assessment   Currently in Pain? No/denies    Multiple Pain Sites No             Capillary Blood Glucose: No results found for this or any previous visit (from the past 24 hour(s)).   Exercise Prescription Changes - 11/05/22 1200       Response to Exercise   Blood Pressure (Admit) 126/70    Blood Pressure (Exercise) 140/72    Blood Pressure (Exit) 130/80    Heart Rate (Admit) 82 bpm    Heart Rate (Exercise) 95 bpm    Heart Rate (Exit) 88 bpm    Oxygen Saturation (Admit) 94 %    Oxygen Saturation (Exercise) 92 %    Oxygen Saturation (Exit) 92 %    Rating of Perceived Exertion (Exercise) 13    Perceived Dyspnea (Exercise) 1    Duration Continue with 30 min of aerobic exercise without signs/symptoms of physical distress.    Intensity THRR unchanged      Progression   Progression Continue to progress workloads to maintain intensity  without signs/symptoms of physical distress.      Resistance Training   Training Prescription Yes    Weight red bands    Reps 10-15    Time 10 Minutes      Oxygen   Oxygen Continuous    Liters 2      NuStep   Level 2    Minutes 15    METs 1.9      Track   Laps 8    Minutes 15    METs 2.23      Oxygen   Maintain Oxygen Saturation 88% or higher             Social History   Tobacco Use  Smoking Status Every Day   Packs/day: 0.25   Years: 54.00   Additional pack years: 0.00   Total pack years: 13.50   Types: Cigarettes   Last attempt to quit: 07/11/2016   Years since quitting: 6.3  Smokeless Tobacco Never  Tobacco Comments   occassionally 1-3 cigarettes per day .  Updated 09/05/2022.    Goals Met:  Proper associated with RPD/PD & O2 Sat Independence with exercise equipment Exercise tolerated well No report of concerns or symptoms today Strength training completed today  Goals Unmet:  Not  Applicable  Comments: Service time is from 1012 to 1140.    Dr. Mechele Collin is Medical Director for Pulmonary Rehab at [facility].

## 2022-11-07 ENCOUNTER — Encounter (HOSPITAL_COMMUNITY)
Admission: RE | Admit: 2022-11-07 | Discharge: 2022-11-07 | Disposition: A | Discharge status: Home / Self Care | Payer: No Typology Code available for payment source | Source: Ambulatory Visit | Attending: Internal Medicine | Admitting: Internal Medicine

## 2022-11-07 DIAGNOSIS — G4733 Obstructive sleep apnea (adult) (pediatric): Secondary | ICD-10-CM

## 2022-11-07 NOTE — Progress Notes (Signed)
Daily Session Note  Patient Details  Name: Steve Riley MRN: [id number] Date of Birth: [birth year] Referring Provider:   [name] Pulmonary Rehab Walk Test from 09/13/2022 in [facility] for Heart, Vascular, & Lung Health  Referring Provider [name] coverage)       Encounter Date: 11/07/2022  Check In:  Session Check In - 11/07/22 1206       Check-In   Supervising physician immediately available to respond to emergencies [facility] immediately available    Physician(s) Edd Fabian    [facility]    Staff Present Samantha Belarus, RD, Dutch Gray, RN, BSN;Randi Reeve BS, ACSM-CEP, Exercise Physiologist;Kaylee Earlene Plater, MS, ACSM-CEP, Exercise Physiologist;Casey Chester Holstein, MS, Exercise Physiologist    Virtual Visit No    Medication changes reported     No    Fall or balance concerns reported    No    Tobacco Cessation No Change    Warm-up and Cool-down Performed as group-led instruction    Resistance Training Performed Yes    VAD Patient? No    PAD/SET Patient? No      Pain Assessment   Currently in Pain? No/denies    Multiple Pain Sites No             Capillary Blood Glucose: No results found for this or any previous visit (from the past 24 hour(s)).    Social History   Tobacco Use  Smoking Status Every Day   Packs/day: 0.25   Years: 54.00   Additional pack years: 0.00   Total pack years: 13.50   Types: Cigarettes   Last attempt to quit: 07/11/2016   Years since quitting: 6.3  Smokeless Tobacco Never  Tobacco Comments   occassionally 1-3 cigarettes per day .  Updated 09/05/2022.    Goals Met:  Independence with exercise equipment Strength training completed today  Goals Unmet:  Not Applicable  Comments: Pt started feeling nauseous on 2nd piece of equipment. Pt pushed himself on 1st station while walking the track. Pt with dry severe cough, has been ongoing since orientation, Dr. Celine Mans  aware. Pt's VS checked and WNL. Pt given cold wash cloth and apple juice, pt began purse lipped breathing. Pt recovered after about 5 minutes and performed cool down with the class.    Dr. Mechele Collin is Medical Director for Pulmonary Rehab at [facility].

## 2022-11-12 ENCOUNTER — Encounter (HOSPITAL_COMMUNITY)
Admission: RE | Admit: 2022-11-12 | Discharge: 2022-11-12 | Disposition: A | Discharge status: Home / Self Care | Payer: No Typology Code available for payment source | Source: Ambulatory Visit | Attending: Internal Medicine | Admitting: Internal Medicine

## 2022-11-12 ENCOUNTER — Encounter (HOSPITAL_COMMUNITY): Payer: No Typology Code available for payment source

## 2022-11-12 DIAGNOSIS — G4733 Obstructive sleep apnea (adult) (pediatric): Secondary | ICD-10-CM | POA: Insufficient documentation

## 2022-11-12 NOTE — Progress Notes (Signed)
Daily Session Note  Patient Details  Name: Steve Riley MRN: [id number] Date of Birth: 12/18/1950 Referring Provider:   [name] Pulmonary Rehab Walk Test from 09/13/2022 in [facility] for Heart, Vascular, & Lung Health  Referring Provider [name] coverage)       Encounter Date: 11/12/2022  Check In:  Session Check In - 11/12/22 0946       Check-In   Supervising physician immediately available to respond to emergencies [facility] immediately available    Physician(s) Carlyon Shadow NP    [facility]    Staff Present Elissa Lovett BS, ACSM-CEP, Exercise Physiologist;Kaylee Earlene Plater, MS, ACSM-CEP, Exercise Physiologist;Casey Katrinka Blazing, RT    Virtual Visit No    Medication changes reported     No    Fall or balance concerns reported    No    Tobacco Cessation No Change    Warm-up and Cool-down Performed as group-led instruction    Resistance Training Performed Yes    VAD Patient? No    PAD/SET Patient? No      Pain Assessment   Currently in Pain? No/denies             Capillary Blood Glucose: No results found for this or any previous visit (from the past 24 hour(s)).    Social History   Tobacco Use  Smoking Status Every Day   Packs/day: 0.25   Years: 54.00   Additional pack years: 0.00   Total pack years: 13.50   Types: Cigarettes   Last attempt to quit: 07/11/2016   Years since quitting: 6.3  Smokeless Tobacco Never  Tobacco Comments   occassionally 1-3 cigarettes per day .  Updated 09/05/2022.    Goals Met:  Independence with exercise equipment Exercise tolerated well No report of concerns or symptoms today Strength training completed today  Goals Unmet:  Not Applicable  Comments: Service time is from 0804 to 0920.    Dr. Mechele Collin is Medical Director for Pulmonary Rehab at [facility].

## 2022-11-14 ENCOUNTER — Encounter (HOSPITAL_COMMUNITY): Payer: No Typology Code available for payment source

## 2022-11-14 ENCOUNTER — Encounter (HOSPITAL_COMMUNITY)
Admission: RE | Admit: 2022-11-14 | Discharge: 2022-11-14 | Disposition: A | Discharge status: Home / Self Care | Payer: No Typology Code available for payment source | Source: Ambulatory Visit | Attending: Internal Medicine | Admitting: Internal Medicine

## 2022-11-14 DIAGNOSIS — G4733 Obstructive sleep apnea (adult) (pediatric): Secondary | ICD-10-CM

## 2022-11-14 NOTE — Progress Notes (Signed)
Daily Session Note  Patient Details  Name: Steve Riley MRN: [id number] Date of Birth: [birth year] Referring Provider:   [name] Pulmonary Rehab Walk Test from 09/13/2022 in [facility] for Heart, Vascular, & Lung Health  Referring Provider [name] coverage)       Encounter Date: 11/14/2022  Check In:  Session Check In - 11/14/22 0912       Check-In   Supervising physician immediately available to respond to emergencies [facility] MD immediately available    Physician(s) Jari Favre, PA    Location [facility]    Staff Present Samantha Belarus, RD, LDN;Randi Dionisio Paschal, ACSM-CEP, Exercise Physiologist;Kaylee Earlene Plater, MS, ACSM-CEP, Exercise Physiologist;Dewan Emond Katrinka Blazing, RT    Virtual Visit No    Medication changes reported     No    Fall or balance concerns reported    No    Tobacco Cessation No Change    Warm-up and Cool-down Performed as group-led instruction    Resistance Training Performed Yes    VAD Patient? No    PAD/SET Patient? No      Pain Assessment   Currently in Pain? No/denies    Multiple Pain Sites No             Capillary Blood Glucose: No results found for this or any previous visit (from the past 24 hour(s)).    Social History   Tobacco Use  Smoking Status Every Day   Packs/day: 0.25   Years: 54.00   Additional pack years: 0.00   Total pack years: 13.50   Types: Cigarettes   Last attempt to quit: 07/11/2016   Years since quitting: 6.3  Smokeless Tobacco Never  Tobacco Comments   occassionally 1-3 cigarettes per day .  Updated 09/05/2022.    Goals Met:  Proper associated with RPD/PD & O2 Sat Independence with exercise equipment Exercise tolerated well No report of concerns or symptoms today Strength training completed today  Goals Unmet:  Not Applicable  Comments: Service time is from 0821 to 0929.    Dr. Mechele Collin is Medical Director for Pulmonary Rehab at [facility].

## 2022-11-19 ENCOUNTER — Encounter (HOSPITAL_COMMUNITY)
Admission: RE | Admit: 2022-11-19 | Discharge: 2022-11-19 | Disposition: A | Discharge status: Home / Self Care | Payer: No Typology Code available for payment source | Source: Ambulatory Visit | Attending: Internal Medicine | Admitting: Internal Medicine

## 2022-11-19 ENCOUNTER — Encounter (HOSPITAL_COMMUNITY): Payer: No Typology Code available for payment source

## 2022-11-19 VITALS — Wt 184.5 lb

## 2022-11-19 DIAGNOSIS — G4733 Obstructive sleep apnea (adult) (pediatric): Secondary | ICD-10-CM

## 2022-11-19 NOTE — Progress Notes (Signed)
Daily Session Note  Patient Details  Name: Steve Riley MRN: [id number] Date of Birth: Jun 23, 1950 Referring Provider:   Doristine Riley Pulmonary Rehab Walk Test from 09/13/2022 in [facility] for Heart, Vascular, & Lung Health  Referring Provider Steve Riley All coverage)       Encounter Date: 11/19/2022  Check In:  Session Check In - 11/19/22 0818       Check-In   Supervising physician immediately available to respond to emergencies [facility] MD immediately available    Physician(s) Jari Favre, PA    [facility]    Staff Present Elissa Lovett BS, ACSM-CEP, Exercise Physiologist;Kaylee Earlene Plater, MS, ACSM-CEP, Exercise Physiologist;Casey Synthia Innocent, RN, BSN    Virtual Visit No    Medication changes reported     No    Fall or balance concerns reported    No    Tobacco Cessation No Change    Warm-up and Cool-down Performed as group-led instruction    Resistance Training Performed Yes    VAD Patient? No    PAD/SET Patient? No      Pain Assessment   Currently in Pain? No/denies    Multiple Pain Sites No             Capillary Blood Glucose: No results found for this or any previous visit (from the past 24 hour(s)).   Exercise Prescription Changes - 11/19/22 0900       Response to Exercise   Blood Pressure (Admit) 130/70    Blood Pressure (Exercise) 156/78    Blood Pressure (Exit) 132/70    Heart Rate (Admit) 70 bpm    Heart Rate (Exercise) 100 bpm    Heart Rate (Exit) 82 bpm    Oxygen Saturation (Admit) 94 %    Oxygen Saturation (Exercise) 92 %    Oxygen Saturation (Exit) 95 %    Rating of Perceived Exertion (Exercise) 13    Perceived Dyspnea (Exercise) 2    Duration Continue with 30 min of aerobic exercise without signs/symptoms of physical distress.    Intensity THRR unchanged      Progression   Progression Continue to progress workloads to maintain intensity without signs/symptoms of physical distress.     Average METs 2.5      Resistance Training   Training Prescription Yes    Weight black bands    Reps 10-15    Time 10 Minutes      Oxygen   Oxygen Continuous    Liters 2      NuStep   Level 3    SPM 60    Minutes 15    METs 2.1      Track   Laps 10.5    Minutes 15    METs 2.6      Home Exercise Plan   Plans to continue exercise at [employer] (comment)    Frequency Add 1 additional day to program exercise sessions.    Initial Home Exercises Provided 11/19/22      Oxygen   Maintain Oxygen Saturation 88% or higher             Social History   Tobacco Use  Smoking Status Every Day   Packs/day: 0.25   Years: 54.00   Additional pack years: 0.00   Total pack years: 13.50   Types: Cigarettes   Last attempt to quit: 07/11/2016   Years since quitting: 6.3  Smokeless Tobacco Never  Tobacco Comments   occassionally 1-3  cigarettes per day .  Updated 09/05/2022.    Goals Met:  Independence with exercise equipment Exercise tolerated well No report of concerns or symptoms today Strength training completed today  Goals Unmet:  Not Applicable  Comments: Service time is from 0806 to 0915.    Dr. Mechele Collin is Medical Director for Pulmonary Rehab at [facility].

## 2022-11-19 NOTE — Progress Notes (Signed)
Home Exercise Prescription I have reviewed a Home Exercise Prescription with Nickie Retort. Denver is currently exercising inconsistently at home. He walks 1 non-rehab/day for 5 min/day. I encouraged Matthe to use his living facility's fitness center or walk laps around his living facility. He agreed to walking 1-2 days/wk for 30 min/day. I mentioned that the 30 min could be divided into 2x15 min or 3x10 min. He agreed with my recommendations. I am unsure how motivated Strider is to exercise at home. Will continue to follow up. The patient stated that their goals were to maintain current heath. We reviewed exercise guidelines, target heart rate during exercise, RPE Scale, weather conditions, endpoints for exercise, warmup and cool down. The patient is encouraged to come to me with any questions. I will continue to follow up with the patient to assist them with progression and safety. Spent 15 min with patient discussing home exercise plan and goals  Joya San, MS, ACSM-CEP 11/19/2022 9:45 AM

## 2022-11-20 NOTE — Progress Notes (Signed)
Pulmonary Individual Treatment Plan  Patient Details  Name: Steve Riley MRN: [id number] Date of Birth: 12/02/1950 Referring Provider:   [name] Pulmonary Rehab Walk Test from 09/13/2022 in [facility]  [facility]erring [name] coverage)       Initial Encounter Date:  Flowsheet Row Pulmonary Rehab Walk Test from 09/13/2022 in [facility]  Date 09/13/22       Visit Diagnosis: OSA (obstructive sleep apnea)  Patient's Home Medications on Admission:   Current Outpatient Medications:    albuterol (PROVENTIL) (2.5 MG/3ML) 0.083% nebulizer solution, Take 3 mLs (2.5 mg total) by nebulization every 6 (six) hours as needed for wheezing or shortness of breath., Disp: 75 mL, Rfl: 5   albuterol (PROVENTIL) (2.5 MG/3ML) 0.083% nebulizer solution, Take 3 mLs (2.5 mg total) by nebulization every 6 (six) hours as needed for wheezing or shortness of breath., Disp: 120 mL, Rfl: 11   albuterol (VENTOLIN HFA) 108 (90 Base) MCG/ACT inhaler, Inhale 1-2 puffs into the lungs every 6 (six) hours as needed for wheezing. (Patient not taking: Reported on 09/13/2022), Disp: 18 g, Rfl: 4   APPLE CIDER VINEGAR PO, Take by mouth in the morning and at bedtime. , Disp: , Rfl:    Ascorbic Acid (VITAMIN C WITH ROSE HIPS) 1000 MG tablet, Take 1,000 mg by mouth daily., Disp: , Rfl:    aspirin 81 MG chewable tablet, Chew 1 tablet (81 mg total) by mouth 2 (two) times daily. (Patient not taking: Reported on 02/14/2022), Disp: 60 tablet, Rfl: 0   benzonatate (TESSALON PERLES) 100 MG capsule, Take 1 capsule (100 mg total) by mouth 2 (two) times daily as needed for cough., Disp: 60 capsule, Rfl: 2   budesonide-formoterol (SYMBICORT) 160-4.5 MCG/ACT inhaler, Inhale 2 puffs into the lungs 2 (two) times daily., Disp: 30.6 each, Rfl: 5   Calcium Carbonate-Vitamin D 600-400 MG-UNIT chew tablet, Chew 1 tablet by  mouth 2 (two) times daily. , Disp: , Rfl:    Cholecalciferol (DIALYVITE VITAMIN D 5000) 125 MCG (5000 UT) capsule, Take 5,000 Units by mouth daily., Disp: , Rfl:    Cyanocobalamin (B-12) 5000 MCG CAPS, Take 5,000 mcg by mouth daily., Disp: , Rfl:    Flaxseed, Linseed, (FLAX PO), Take 2,600 mg by mouth daily., Disp: , Rfl:    folic acid (FOLVITE) 1 MG tablet, Take 1 tablet by mouth daily., Disp: , Rfl:    methocarbamol (ROBAXIN) 500 MG tablet, Take 1 tablet (500 mg total) by mouth every 6 (six) hours as needed for muscle spasms. (Patient not taking: Reported on 02/14/2022), Disp: 60 tablet, Rfl: 0   Methylsulfonylmethane (MSM) 1000 MG CAPS, Take 1,000 mg by mouth 2 (two) times daily., Disp: , Rfl:    ondansetron (ZOFRAN) 4 MG tablet, Take 1 tablet (4 mg total) by mouth every 8 (eight) hours as needed for up to 20 doses for nausea or vomiting. (Patient not taking: Reported on 02/14/2022), Disp: 20 tablet, Rfl: 0   OVER THE COUNTER MEDICATION, Take 4,000 Units by mouth in the morning and at bedtime. Serrapeptase, Disp: , Rfl:    oxyCODONE (OXY IR/ROXICODONE) 5 MG immediate release tablet, Take 1-2 tablets (5-10 mg total) by mouth every 4 (four) hours as needed for moderate pain (pain score 4-6). (Patient not taking: Reported on 02/14/2022), Disp: 30 tablet, Rfl: 0   predniSONE (DELTASONE) 20 MG tablet, Take 2 tablets (40 mg total)  by mouth daily with breakfast. (Patient not taking: Reported on 09/05/2022), Disp: 10 tablet, Rfl: 0   Probiotic CAPS, Take 1 capsule by mouth 2 (two) times daily with a meal., Disp: , Rfl:    selenium 200 MCG TABS tablet, Take 200 mcg by mouth daily., Disp: , Rfl:    silver sulfADIAZINE (SILVADENE) 1 % cream, Apply 1 application  topically in the morning, at noon, and at bedtime., Disp: , Rfl:    Tiotropium Bromide Monohydrate (SPIRIVA RESPIMAT) 2.5 MCG/ACT AERS, Inhale 1 puff into the lungs in the morning and at bedtime., Disp: 12 g, Rfl: 2   traZODone (DESYREL) 100 MG tablet, Take  100 mg by mouth at bedtime. (Patient not taking: Reported on 02/14/2022), Disp: , Rfl:    Turmeric Curcumin 500 MG CAPS, Take 500 mg by mouth in the morning and at bedtime.  (Patient not taking: Reported on 09/05/2022), Disp: , Rfl:    Zinc 50 MG TABS, Take 50 mg by mouth daily. (Patient not taking: Reported on 02/14/2022), Disp: , Rfl:   Past Medical History: Past Medical History:  Diagnosis Date   COPD (chronic obstructive pulmonary disease) (HCC)    Pneumonia    Prostate cancer (HCC)     Tobacco Use: Social History   Tobacco Use  Smoking Status Every Day   Packs/day: 0.25   Years: 54.00   Additional pack years: 0.00   Total pack years: 13.50   Types: Cigarettes   Last attempt to quit: 07/11/2016   Years since quitting: 6.3  Smokeless Tobacco Never  Tobacco Comments   occassionally 1-3 cigarettes per day .  Updated 09/05/2022.    Labs: Review Flowsheet       Latest Ref Rng & Units 03/06/2018 03/07/2018  Labs for ITP Cardiac and Pulmonary Rehab  Hemoglobin A1c 4.8 - 5.6 % - 6.5   Bicarbonate 20.0 - 28.0 mmol/L 22.9  -  TCO2 22 - 32 mmol/L 24  -  Acid-base deficit 0.0 - 2.0 mmol/L 1.0  -  O2 Saturation % 81.0  -    Capillary Blood Glucose: Lab Results  Component Value Date   GLUCAP 92 08/18/2019   GLUCAP 86 03/09/2018   GLUCAP 104 (H) 03/09/2018   GLUCAP 139 (H) 03/08/2018   GLUCAP 225 (H) 03/07/2018     Pulmonary Assessment Scores:  Pulmonary Assessment Scores     Row Name 09/13/22 1037         ADL UCSD   SOB Score total 58       CAT Score   CAT Score 19       mMRC Score   mMRC Score 4             UCSD: Self-administered rating of dyspnea associated with activities of daily living (ADLs) 6-point scale (0 = "not at all" to 5 = "maximal or unable to do because of breathlessness")  Scoring Scores range from 0 to 120.  Minimally important difference is 5 units  CAT: CAT can identify the health impairment of COPD patients and is better correlated with  disease progression.  CAT has a scoring range of zero to 40. The CAT score is classified into four groups of low (less than 10), medium (10 - 20), high (21-30) and very high (31-40) based on the impact level of disease on health status. A CAT score over 10 suggests significant symptoms.  A worsening CAT score could be explained by an exacerbation, poor medication adherence, poor inhaler technique, or progression  of COPD or comorbid conditions.  CAT MCID is 2 points  mMRC: mMRC (Modified Medical Research Council) Dyspnea Scale is used to assess the degree of baseline functional disability in patients of respiratory disease due to dyspnea. No minimal important difference is established. A decrease in score of 1 point or greater is considered a positive change.   Pulmonary Function Assessment:  Pulmonary Function Assessment - 09/13/22 1013       Breath   Bilateral Breath Sounds Clear   Expiratory wheezes with exertion   Shortness of Breath Yes;Limiting activity;Panic with Shortness of Breath             Exercise Target Goals: Exercise Program Goal: Individual exercise prescription set using results from initial 6 min walk test and THRR while considering  patient's activity barriers and safety.   Exercise Prescription Goal: Initial exercise prescription builds to 30-45 minutes a day of aerobic activity, 2-3 days per week.  Home exercise guidelines will be given to patient during program as part of exercise prescription that the participant will acknowledge.  Activity Barriers & Risk Stratification:  Activity Barriers & Cardiac Risk Stratification - 09/13/22 1008       Activity Barriers & Cardiac Risk Stratification   Activity Barriers Left Hip Replacement;Right Hip Replacement;Shortness of Breath;Muscular Weakness;Deconditioning;Balance Concerns             6 Minute Walk:  6 Minute Walk     Row Name 09/13/22 1147         6 Minute Walk   Phase Initial     Distance 544  feet     Walk Time 6 minutes     # of Rest Breaks 1  5:07-6:00     MPH 1.03     METS 1.5     RPE 17     Perceived Dyspnea  3     VO2 Peak 5.24     Symptoms Yes (comment)     Comments Pt had to stop and rest at 5 min with accessory muscle use and sats dropping to 79%. O2 was palced @ 2L with sats recovering to 94% after O2 placed.     Resting HR 69 bpm     Resting BP 140/68     Resting Oxygen Saturation  94 %     Exercise Oxygen Saturation  during 6 min walk 79 %     Max Ex. HR 96 bpm     Max Ex. BP 154/70     2 Minute Post BP 138/70       Interval HR   1 Minute HR 79     2 Minute HR 84     3 Minute HR 88     4 Minute HR 96     5 Minute HR 86     6 Minute HR 89     2 Minute Post HR 77     Interval Heart Rate? Yes       Interval Oxygen   Baseline Oxygen Saturation % 94 %     1 Minute Oxygen Saturation % 94 %     1 Minute Liters of Oxygen 0 L     2 Minute Oxygen Saturation % 95 %     2 Minute Liters of Oxygen 0 L     3 Minute Oxygen Saturation % 92 %     3 Minute Liters of Oxygen 0 L     4 Minute Oxygen Saturation % 95 %  4 Minute Liters of Oxygen 0 L     5 Minute Oxygen Saturation % 79 %  placed pt on 2L     5 Minute Liters of Oxygen 0 L     6 Minute Oxygen Saturation % 94 %     6 Minute Liters of Oxygen 2 L     2 Minute Post Oxygen Saturation % 96 %     2 Minute Post Liters of Oxygen 0 L              Oxygen Initial Assessment:  Oxygen Initial Assessment - 09/13/22 1012       Home Oxygen   Home Oxygen Device None    Sleep Oxygen Prescription None    Home Exercise Oxygen Prescription None    Home Resting Oxygen Prescription None      Initial 6 min Walk   Oxygen Used Continuous    Liters per minute 2      Program Oxygen Prescription   Program Oxygen Prescription Continuous    Liters per minute 2    Comments During walk test on RA, pt desaturated down to 79%. Placed on 2L [place].      Intervention   Short Term Goals To learn and exhibit compliance with  exercise, home and travel O2 prescription;To learn and understand importance of maintaining oxygen saturations>88%;To learn and demonstrate proper use of respiratory medications;To learn and understand importance of monitoring SPO2 with pulse oximeter and demonstrate accurate use of the pulse oximeter.;To learn and demonstrate proper pursed lip breathing techniques or other breathing techniques.     Long  Term Goals Exhibits compliance with exercise, home  and travel O2 prescription;Maintenance of O2 saturations>88%;Compliance with respiratory medication;Verbalizes importance of monitoring SPO2 with pulse oximeter and return demonstration;Exhibits proper breathing techniques, such as pursed lip breathing or other method taught during program session;Demonstrates proper use of MDI's             Oxygen Re-Evaluation:  Oxygen Re-Evaluation     Row Name 09/13/22 1012 09/16/22 0908 10/16/22 1155 11/11/22 1135       Program Oxygen Prescription   Program Oxygen Prescription -- Continuous Continuous Continuous    Liters per minute -- 2 2 2       Home Oxygen   Home Oxygen Device -- None None None    Sleep Oxygen Prescription -- None None None    Home Exercise Oxygen Prescription -- None None None    Home Resting Oxygen Prescription -- None None None      Goals/Expected Outcomes   Short Term Goals -- To learn and exhibit compliance with exercise, home and travel O2 prescription;To learn and understand importance of maintaining oxygen saturations>88%;To learn and demonstrate proper use of respiratory medications;To learn and understand importance of monitoring SPO2 with pulse oximeter and demonstrate accurate use of the pulse oximeter.;To learn and demonstrate proper pursed lip breathing techniques or other breathing techniques.  To learn and exhibit compliance with exercise, home and travel O2 prescription;To learn and understand importance of maintaining oxygen saturations>88%;To learn and  demonstrate proper use of respiratory medications;To learn and understand importance of monitoring SPO2 with pulse oximeter and demonstrate accurate use of the pulse oximeter.;To learn and demonstrate proper pursed lip breathing techniques or other breathing techniques.  To learn and exhibit compliance with exercise, home and travel O2 prescription;To learn and understand importance of maintaining oxygen saturations>88%;To learn and demonstrate proper use of respiratory medications;To learn and understand importance of monitoring SPO2 with pulse oximeter  and demonstrate accurate use of the pulse oximeter.;To learn and demonstrate proper pursed lip breathing techniques or other breathing techniques.     Long  Term Goals -- Exhibits compliance with exercise, home  and travel O2 prescription;Maintenance of O2 saturations>88%;Compliance with respiratory medication;Verbalizes importance of monitoring SPO2 with pulse oximeter and return demonstration;Exhibits proper breathing techniques, such as pursed lip breathing or other method taught during program session;Demonstrates proper use of MDI's Exhibits compliance with exercise, home  and travel O2 prescription;Maintenance of O2 saturations>88%;Compliance with respiratory medication;Verbalizes importance of monitoring SPO2 with pulse oximeter and return demonstration;Exhibits proper breathing techniques, such as pursed lip breathing or other method taught during program session;Demonstrates proper use of MDI's Exhibits compliance with exercise, home  and travel O2 prescription;Maintenance of O2 saturations>88%;Compliance with respiratory medication;Verbalizes importance of monitoring SPO2 with pulse oximeter and return demonstration;Exhibits proper breathing techniques, such as pursed lip breathing or other method taught during program session;Demonstrates proper use of MDI's    Comments [facility] to [facility] home O2 -- --     Goals/Expected Outcomes Compliance with home oxygen when obtained. Compliance and understanding of oxygen saturation monitoring and breathing techniques to decrease shortness of breath. Compliance and understanding of oxygen saturation monitoring and breathing techniques to decrease shortness of breath. Compliance and understanding of oxygen saturation monitoring and breathing techniques to decrease shortness of breath.             Oxygen Discharge (Final Oxygen Re-Evaluation):  Oxygen Re-Evaluation - 11/11/22 1135       Program Oxygen Prescription   Program Oxygen Prescription Continuous    Liters per minute 2      Home Oxygen   Home Oxygen Device None    Sleep Oxygen Prescription None    Home Exercise Oxygen Prescription None    Home Resting Oxygen Prescription None      Goals/Expected Outcomes   Short Term Goals To learn and exhibit compliance with exercise, home and travel O2 prescription;To learn and understand importance of maintaining oxygen saturations>88%;To learn and demonstrate proper use of respiratory medications;To learn and understand importance of monitoring SPO2 with pulse oximeter and demonstrate accurate use of the pulse oximeter.;To learn and demonstrate proper pursed lip breathing techniques or other breathing techniques.     Long  Term Goals Exhibits compliance with exercise, home  and travel O2 prescription;Maintenance of O2 saturations>88%;Compliance with respiratory medication;Verbalizes importance of monitoring SPO2 with pulse oximeter and return demonstration;Exhibits proper breathing techniques, such as pursed lip breathing or other method taught during program session;Demonstrates proper use of MDI's    Goals/Expected Outcomes Compliance and understanding of oxygen saturation monitoring and breathing techniques to decrease shortness of breath.             Initial Exercise Prescription:  Initial Exercise Prescription - 09/13/22 1100       Date of  Initial Exercise RX and Referring Provider   Date 09/13/22    [facility]erring [name] coverage)    Expected Discharge Date 12/10/22      Oxygen   Oxygen Intermittent    Liters 2    Maintain Oxygen Saturation 88% or higher      NuStep   Level 1    SPM 60    Minutes 30      Prescription Details   Frequency (times per week) 2    Duration Progress to 30 minutes of continuous aerobic without signs/symptoms of physical distress  Intensity   THRR 40-80% of Max Heartrate 59-118    Ratings of Perceived Exertion 11-13    Perceived Dyspnea 0-4      Progression   Progression Continue to progress workloads to maintain intensity without signs/symptoms of physical distress.      Resistance Training   Training Prescription Yes    Reps 10-15             Perform Capillary Blood Glucose checks as needed.  Exercise Prescription Changes:   Exercise Prescription Changes     Row Name 09/24/22 1200 10/08/22 1200 10/22/22 1200 11/05/22 1200 11/19/22 0900     Response to Exercise   Blood Pressure (Admit) 128/60 122/58 114/62 126/70 130/70   Blood Pressure (Exercise) 110/78 124/66 130/70 140/72 156/78   Blood Pressure (Exit) 120/70 122/74 122/64 130/80 132/70   Heart Rate (Admit) 81 bpm 69 bpm 92 bpm 82 bpm 70 bpm   Heart Rate (Exercise) 87 bpm 92 bpm 101 bpm 95 bpm 100 bpm   Heart Rate (Exit) 88 bpm 73 bpm 78 bpm 88 bpm 82 bpm   Oxygen Saturation (Admit) 94 % 94 % 95 % 94 % 94 %   Oxygen Saturation (Exercise) 94 % 94 % 94 % 92 % 92 %   Oxygen Saturation (Exit) 95 % 94 % 96 % 92 % 95 %   Rating of Perceived Exertion (Exercise) 13 13 11 13 13    Perceived Dyspnea (Exercise) 1 2 1 1 2    Duration Continue with 30 min of aerobic exercise without signs/symptoms of physical distress. Continue with 30 min of aerobic exercise without signs/symptoms of physical distress. Continue with 30 min of aerobic exercise without signs/symptoms of physical distress. Continue with 30 min of  aerobic exercise without signs/symptoms of physical distress. Continue with 30 min of aerobic exercise without signs/symptoms of physical distress.   Intensity THRR unchanged THRR unchanged THRR unchanged THRR unchanged THRR unchanged     Progression   Progression Continue to progress workloads to maintain intensity without signs/symptoms of physical distress. Continue to progress workloads to maintain intensity without signs/symptoms of physical distress. Continue to progress workloads to maintain intensity without signs/symptoms of physical distress. Continue to progress workloads to maintain intensity without signs/symptoms of physical distress. Continue to progress workloads to maintain intensity without signs/symptoms of physical distress.   Average METs -- -- -- -- 2.5     Resistance Training   Training Prescription Yes Yes Yes Yes Yes   Weight red bands red bands red bands red bands black bands   Reps 10-15 10-15 10-15 10-15 10-15   Time 10 Minutes 10 Minutes 10 Minutes 10 Minutes 10 Minutes     Oxygen   Oxygen Continuous Continuous Continuous Continuous Continuous   Liters 2 2 2 2 2      NuStep   Level 1 2 2 2 3    SPM 60 60 -- -- 60   Minutes 30 15 15 15 15    METs 1.8 1.9 2 1.9 2.1     Track   Laps -- 8 11 8  10.5   Minutes -- 15 15 15 15    METs -- 2.23 2.69 2.23 2.6     Home Exercise Plan   Plans to continue exercise at -- -- -- -- [employer] (comment)   Frequency -- -- -- -- Add 1 additional day to program exercise sessions.   Initial Home Exercises Provided -- -- -- -- 11/19/22     Oxygen   Maintain Oxygen  Saturation 88% or higher 88% or higher 88% or higher 88% or higher 88% or higher            Exercise Comments:   Exercise Comments     Row Name 09/19/22 1217 11/19/22 0938         Exercise Comments Pt completed first day of group exercise. He exercised 30 min on the recumbent bike at level 1, METs 1.7. tolerated well. Pt performed warm up and cool  down following demonstrative and verbal cues, including squats. Discussed METs with good reception. will progress as tolerated. Come home [facility] with [facility]. Kebin is currently exercising inconsistently at home. He walks 1 non-rehab/day for 5 min/day. I encouraged Jameon to use his living facility's fitness center or walk laps around his living facility. He agreed to walking 1-2 days/wk for 30 min/day. I mentioned that the 30 min could be divided into 2x15 min or 3x10 min. He agreed with my recommendations. I am unsure how motivated Hill is to exercise at home. Will continue to follow up.               Exercise Goals and Review:   Exercise Goals     Row Name 09/13/22 1011 09/16/22 0906 10/16/22 1147 11/11/22 1126       Exercise Goals   Increase Physical Activity Yes Yes Yes Yes    Intervention Provide advice, education, support and counseling about physical activity/exercise needs.;Develop an individualized exercise prescription for aerobic and resistive training based on initial evaluation findings, risk stratification, comorbidities and participant's personal goals. Provide advice, education, support and counseling about physical activity/exercise needs.;Develop an individualized exercise prescription for aerobic and resistive training based on initial evaluation findings, risk stratification, comorbidities and participant's personal goals. Provide advice, education, support and counseling about physical activity/exercise needs.;Develop an individualized exercise prescription for aerobic and resistive training based on initial evaluation findings, risk stratification, comorbidities and participant's personal goals. Provide advice, education, support and counseling about physical activity/exercise needs.;Develop an individualized exercise prescription for aerobic and resistive training based on initial evaluation findings, risk stratification, comorbidities and participant's personal goals.     Expected Outcomes Long Term: Add in home exercise to make exercise part of routine and to increase amount of physical activity.;Short Term: Attend rehab on a regular basis to increase amount of physical activity.;Long Term: Exercising regularly at least 3-5 days a week. Long Term: Add in home exercise to make exercise part of routine and to increase amount of physical activity.;Short Term: Attend rehab on a regular basis to increase amount of physical activity.;Long Term: Exercising regularly at least 3-5 days a week. Long Term: Add in home exercise to make exercise part of routine and to increase amount of physical activity.;Short Term: Attend rehab on a regular basis to increase amount of physical activity.;Long Term: Exercising regularly at least 3-5 days a week. Long Term: Add in home exercise to make exercise part of routine and to increase amount of physical activity.;Short Term: Attend rehab on a regular basis to increase amount of physical activity.;Long Term: Exercising regularly at least 3-5 days a week.    Increase Strength and Stamina Yes Yes Yes Yes    Intervention Provide advice, education, support and counseling about physical activity/exercise needs.;Develop an individualized exercise prescription for aerobic and resistive training based on initial evaluation findings, risk stratification, comorbidities and participant's personal goals. Provide advice, education, support and counseling about physical activity/exercise needs.;Develop an individualized exercise prescription for aerobic and resistive training based on initial evaluation  findings, risk stratification, comorbidities and participant's personal goals. Provide advice, education, support and counseling about physical activity/exercise needs.;Develop an individualized exercise prescription for aerobic and resistive training based on initial evaluation findings, risk stratification, comorbidities and participant's personal goals. Provide  advice, education, support and counseling about physical activity/exercise needs.;Develop an individualized exercise prescription for aerobic and resistive training based on initial evaluation findings, risk stratification, comorbidities and participant's personal goals.    Expected Outcomes Short Term: Increase workloads from initial exercise prescription for resistance, speed, and METs.;Short Term: Perform resistance training exercises routinely during rehab and add in resistance training at home;Long Term: Improve cardiorespiratory fitness, muscular endurance and strength as measured by increased METs and functional capacity ( ) Short Term: Increase workloads from initial exercise prescription for resistance, speed, and METs.;Short Term: Perform resistance training exercises routinely during rehab and add in resistance training at home;Long Term: Improve cardiorespiratory fitness, muscular endurance and strength as measured by increased METs and functional capacity ( ) Short Term: Increase workloads from initial exercise prescription for resistance, speed, and METs.;Short Term: Perform resistance training exercises routinely during rehab and add in resistance training at home;Long Term: Improve cardiorespiratory fitness, muscular endurance and strength as measured by increased METs and functional capacity ( ) Short Term: Increase workloads from initial exercise prescription for resistance, speed, and METs.;Short Term: Perform resistance training exercises routinely during rehab and add in resistance training at home;Long Term: Improve cardiorespiratory fitness, muscular endurance and strength as measured by increased METs and functional capacity ( )    Able to understand and use rate of perceived exertion (RPE) scale Yes Yes Yes Yes    Intervention Provide education and explanation on how to use RPE scale Provide education and explanation on how to use RPE scale Provide education and explanation on  how to use RPE scale Provide education and explanation on how to use RPE scale    Expected Outcomes Short Term: Able to use RPE daily in rehab to express subjective intensity level;Long Term:  Able to use RPE to guide intensity level when exercising independently Short Term: Able to use RPE daily in rehab to express subjective intensity level;Long Term:  Able to use RPE to guide intensity level when exercising independently Short Term: Able to use RPE daily in rehab to express subjective intensity level;Long Term:  Able to use RPE to guide intensity level when exercising independently Short Term: Able to use RPE daily in rehab to express subjective intensity level;Long Term:  Able to use RPE to guide intensity level when exercising independently    Able to understand and use Dyspnea scale Yes Yes Yes Yes    Intervention Provide education and explanation on how to use Dyspnea scale Provide education and explanation on how to use Dyspnea scale Provide education and explanation on how to use Dyspnea scale Provide education and explanation on how to use Dyspnea scale    Expected Outcomes Short Term: Able to use Dyspnea scale daily in rehab to express subjective sense of shortness of breath during exertion;Long Term: Able to use Dyspnea scale to guide intensity level when exercising independently Short Term: Able to use Dyspnea scale daily in rehab to express subjective sense of shortness of breath during exertion;Long Term: Able to use Dyspnea scale to guide intensity level when exercising independently Short Term: Able to use Dyspnea scale daily in rehab to express subjective sense of shortness of breath during exertion;Long Term: Able to use Dyspnea scale to guide intensity level when exercising independently Short Term: Able to use Dyspnea  scale daily in rehab to express subjective sense of shortness of breath during exertion;Long Term: Able to use Dyspnea scale to guide intensity level when exercising  independently    Knowledge and understanding of Target Heart Rate Range (THRR) Yes Yes Yes Yes    Intervention Provide education and explanation of THRR including how the numbers were predicted and where they are located for reference Provide education and explanation of THRR including how the numbers were predicted and where they are located for reference Provide education and explanation of THRR including how the numbers were predicted and where they are located for reference Provide education and explanation of THRR including how the numbers were predicted and where they are located for reference    Expected Outcomes Short Term: Able to state/look up THRR;Short Term: Able to use daily as guideline for intensity in rehab;Long Term: Able to use THRR to govern intensity when exercising independently Short Term: Able to state/look up THRR;Short Term: Able to use daily as guideline for intensity in rehab;Long Term: Able to use THRR to govern intensity when exercising independently Short Term: Able to state/look up THRR;Short Term: Able to use daily as guideline for intensity in rehab;Long Term: Able to use THRR to govern intensity when exercising independently Short Term: Able to state/look up THRR;Short Term: Able to use daily as guideline for intensity in rehab;Long Term: Able to use THRR to govern intensity when exercising independently    Understanding of Exercise Prescription Yes Yes Yes Yes    Intervention Provide education, explanation, and written materials on patient's individual exercise prescription Provide education, explanation, and written materials on patient's individual exercise prescription Provide education, explanation, and written materials on patient's individual exercise prescription Provide education, explanation, and written materials on patient's individual exercise prescription    Expected Outcomes Short Term: Able to explain program exercise prescription;Long Term: Able to explain  home exercise prescription to exercise independently Short Term: Able to explain program exercise prescription;Long Term: Able to explain home exercise prescription to exercise independently Short Term: Able to explain program exercise prescription;Long Term: Able to explain home exercise prescription to exercise independently Short Term: Able to explain program exercise prescription;Long Term: Able to explain home exercise prescription to exercise independently             Exercise Goals Re-Evaluation :  Exercise Goals Re-Evaluation     Row Name 09/16/22 0906 10/16/22 1147 11/11/22 1126         Exercise Goal Re-Evaluation   Exercise Goals Review Increase Physical Activity;Able to understand and use Dyspnea scale;Understanding of Exercise Prescription;Increase Strength and Stamina;Knowledge and understanding of Target Heart Rate Range (THRR);Able to understand and use rate of perceived exertion (RPE) scale Increase Physical Activity;Able to understand and use Dyspnea scale;Understanding of Exercise Prescription;Increase Strength and Stamina;Knowledge and understanding of Target Heart Rate Range (THRR);Able to understand and use rate of perceived exertion (RPE) scale Increase Physical Activity;Able to understand and use Dyspnea scale;Understanding of Exercise Prescription;Increase Strength and Stamina;Knowledge and understanding of Target Heart Rate Range (THRR);Able to understand and use rate of perceived exertion (RPE) scale     Comments Pt is scheduled to start exercise this week. Will continue to monitor and progress as able. [place] has completed 8 exercise sessions. He exercises for 15 min on the track and Nustep. Alton averages 2.54 METs on the track and 1.9 METs at level 2 on the Nustep. He performs the warmup and cooldown standing without limitations. Srijan has slightly increased his track laps and METs.  [place] is deconditioning and somewhat difficult to progress. Will continue to monitor and  progress able. Tyrus has completed 12 exercise sessions. He exercises for 15 min on the track and Nustep. Daray averages 2.54 METs on the track and 2.2 METs at level 2 on the Nustep. He performs the warmup and cooldown standing without limitations. Chou has increased her workload for the Nustep as METs have slightly increased. He is still deconditioned, which is why he has not had much improvement in his METs. Jerrod seems motivated to exercise as he enjoys class. Will continue to monitor and progress as able.     Expected Outcomes Through exercise at rehab and home, the patient will decrease shortness of breath with daily activities and feel confident in carrying out an exercise regimen at home. Through exercise at rehab and home, the patient will decrease shortness of breath with daily activities and feel confident in carrying out an exercise regimen at home. Through exercise at rehab and home, the patient will decrease shortness of breath with daily activities and feel confident in carrying out an exercise regimen at home.              Discharge Exercise Prescription (Final Exercise Prescription Changes):  Exercise Prescription Changes - 11/19/22 0900       Response to Exercise   Blood Pressure (Admit) 130/70    Blood Pressure (Exercise) 156/78    Blood Pressure (Exit) 132/70    Heart Rate (Admit) 70 bpm    Heart Rate (Exercise) 100 bpm    Heart Rate (Exit) 82 bpm    Oxygen Saturation (Admit) 94 %    Oxygen Saturation (Exercise) 92 %    Oxygen Saturation (Exit) 95 %    Rating of Perceived Exertion (Exercise) 13    Perceived Dyspnea (Exercise) 2    Duration Continue with 30 min of aerobic exercise without signs/symptoms of physical distress.    Intensity THRR unchanged      Progression   Progression Continue to progress workloads to maintain intensity without signs/symptoms of physical distress.    Average METs 2.5      Resistance Training   Training Prescription Yes    Weight  black bands    Reps 10-15    Time 10 Minutes      Oxygen   Oxygen Continuous    Liters 2      NuStep   Level 3    SPM 60    Minutes 15    METs 2.1      Track   Laps 10.5    Minutes 15    METs 2.6      Home Exercise Plan   Plans to continue exercise at [employer] (comment)    Frequency Add 1 additional day to program exercise sessions.    Initial Home Exercises Provided 11/19/22      Oxygen   Maintain Oxygen Saturation 88% or higher             Nutrition:  Target Goals: Understanding of nutrition guidelines, daily intake of sodium 1500mg , cholesterol 200mg , calories 30% from fat and 7% or less from saturated fats, daily to have 5 or more servings of fruits and vegetables.  Biometrics:  Pre Biometrics - 09/13/22 0955       Pre Biometrics   Grip Strength 40 kg              Nutrition Therapy Plan and Nutrition Goals:  Nutrition Therapy & Goals - 11/14/22 1105  Nutrition Therapy   Diet Heart  Healthy Diet      Personal Nutrition Goals   Nutrition Goal Patient to improve diet quality by using the plate method as a guide for meal planning to include lean protein/plant protein, fruits, vegetables, whole grains, nonfat dairy as part of a well-balanced diet.    Comments Goals in progress. Kaeleb reports stable appetite and standard eating patterns. He lives alone and does his own grocery shopping and cooking. Leoncio has newly quit smoking. He is down ~3.7# since starting with our program.  Ardath will benefit from participation in pulmonary rehab for nutrition and exercise support.      Intervention Plan   Intervention Prescribe, educate and counsel regarding individualized specific dietary modifications aiming towards targeted core components such as weight, hypertension, lipid management, diabetes, heart failure and other comorbidities.;Nutrition handout(s) given to patient.    Expected Outcomes Short Term Goal: Understand basic principles of dietary  content, such as calories, fat, sodium, cholesterol and nutrients.;Long Term Goal: Adherence to prescribed nutrition plan.             Nutrition Assessments:  MEDIFICTS Score Key: ?70 Need to make dietary changes  40-70 Heart Healthy Diet ? 40 Therapeutic Level Cholesterol Diet   Picture Your Plate Scores: <40 Unhealthy dietary pattern with much room for improvement. 41-50 Dietary pattern unlikely to meet recommendations for good health and room for improvement. 51-60 More healthful dietary pattern, with some room for improvement.  >60 Healthy dietary pattern, although there may be some specific behaviors that could be improved.    Nutrition Goals Re-Evaluation:  Nutrition Goals Re-Evaluation     Row Name 09/19/22 1156 10/17/22 1448 11/14/22 1105         Goals   Current Weight 189 lb 6 oz (85.9 kg) 183 lb 13.8 oz (83.4 kg) 184 lb 4.9 oz (83.6 kg)     Comment A1c 6.1, cholesterol 226, triglycerides 185, HDL 39, LDL 150 No new labs; most recent labs A1c 6.1, cholesterol 226, triglycerides 185, HDL 39, LDL 150. He is down 4.2# since starting with our program. No new labs; most recent labs A1c 6.1, cholesterol 226, triglycerides 185, HDL 39, LDL 150.     Expected Outcome Mithran reports stable appetite and standard eating patterns. He lives alone and does his own grocery shopping and cooking. He is currently working on smoking cessation. Jamol will benefit from participation in pulmonary rehab for nutrition and exercise support. Ashanti reports stable appetite and standard eating patterns. He lives alone and does his own grocery shopping and cooking. He is currently working on smoking cessation. Fitz will benefit from participation in pulmonary rehab for nutrition and exercise support. Goals in progress. Kenston reports stable appetite and standard eating patterns. He lives alone and does his own grocery shopping and cooking. Yossi has newly quit smoking. He is down ~3.7# since starting with  our program. Shaarav will benefit from participation in pulmonary rehab for nutrition and exercise support.              Nutrition Goals Discharge (Final Nutrition Goals Re-Evaluation):  Nutrition Goals Re-Evaluation - 11/14/22 1105       Goals   Current Weight 184 lb 4.9 oz (83.6 kg)    Comment No new labs; most recent labs A1c 6.1, cholesterol 226, triglycerides 185, HDL 39, LDL 150.    Expected Outcome Goals in progress. Omarr reports stable appetite and standard eating patterns. He lives alone and does his own grocery shopping  and cooking. Alamin has newly quit smoking. He is down ~3.7# since starting with our program. Byran will benefit from participation in pulmonary rehab for nutrition and exercise support.             Psychosocial: Target Goals: Acknowledge presence or absence of significant depression and/or stress, maximize coping skills, provide positive support system. Participant is able to verbalize types and ability to use techniques and skills needed for reducing stress and depression.  Initial Review & Psychosocial Screening:  Initial Psych Review & Screening - 09/13/22 1014       Initial Review   Current issues with Current Stress Concerns    Source of Stress Concerns Family;Financial;Unable to participate in former interests or hobbies    Comments Pt is currently concerned about not having a relationship with his 2 children, feels ashamed of smoking, and has little interest in doing things, grumpy about his medical care, [facility], and getting prescriptions in a timely manner      Family Dynamics   Good Support System? Yes    Comments Friends from living facility      Barriers   Psychosocial barriers to participate in program The patient should benefit from training in stress management and relaxation.;Psychosocial barriers identified (see note)      Screening Interventions   Interventions Encouraged to exercise;Provide feedback about the scores to participant;To  provide support and resources with identified psychosocial needs    Expected Outcomes Long Term Goal: Stressors or current issues are controlled or eliminated.;Short Term goal: Identification and review with participant of any Quality of Life or Depression concerns found by scoring the questionnaire.;Long Term goal: The participant improves quality of Life and PHQ9 Scores as seen by post scores and/or verbalization of changes;Short Term goal: Utilizing psychosocial counselor, staff and physician to assist with identification of specific Stressors or current issues interfering with healing process. Setting desired goal for each stressor or current issue identified.   Pt scored high on PHQ2/9. Pt denies current depression although speaking w/patient there are possible signs. Pt currently declines to speak to a therapist & making an appt for follow up with his PCP. We will continue to assess and monitor his needs.            Quality of Life Scores:  Scores of 19 and below usually indicate a poorer quality of life in these areas.  A difference of  2-3 points is a clinically meaningful difference.  A difference of 2-3 points in the total score of the Quality of Life Index has been associated with significant improvement in overall quality of life, self-image, physical symptoms, and general health in studies assessing change in quality of life.  PHQ-9: Review Flowsheet       09/13/2022 04/24/2018 07/02/2017 03/13/2017  Depression screen PHQ 2/9  Decreased Interest 1 0 1 0 0  Down, Depressed, Hopeless 1 0 1 0 0  PHQ - 2 Score 2 0 2 0 0  Altered sleeping 1 0 0 - -  Tired, decreased energy 2 3 3  - -  Change in appetite 0 0 0 - -  Feeling bad or failure about yourself  0 2 1 - -  Trouble concentrating 2 1 1  - -  Moving slowly or fidgety/restless 0 0 0 - -  Suicidal thoughts 0 0 0 - -  PHQ-9 Score 7 6 7  - -  Difficult doing work/chores Very difficult Very difficult - -   Interpretation of Total  Score  Total Score  Depression Severity:  1-4 = Minimal depression, 5-9 = Mild depression, 10-14 = Moderate depression, 15-19 = Moderately severe depression, 20-27 = Severe depression   Psychosocial Evaluation and Intervention:  Psychosocial Evaluation - 09/13/22 1019       Psychosocial Evaluation & Interventions   Interventions Stress management education;Encouraged to exercise with the program and follow exercise prescription    Comments Pt is currently concerned about not having a relationship with his 2 children, feels ashamed of still smoking, has low self worth, little interest in doing things, and  has a grumpy disposition due to his medical care, [facility], and getting prescriptions in a timely manner    Expected Outcomes Pt will report and employ positive and healthy techniques for stress management. Currently declines referral to his PCP and a therapist    Continue Psychosocial Services  Follow up required by staff   We will continue to monitor and assess            Psychosocial Re-Evaluation:  Psychosocial Re-Evaluation     Row Name 09/16/22 0948 10/14/22 1515 11/08/22 1116         Psychosocial Re-Evaluation   Current issues with Current Stress Concerns Current Stress Concerns Current Stress Concerns     Comments No change since assessment on 09/13/2022. Felicia currently states that his stress level is lower than before. He states that he likes having extra resources available to him through Pulmonary Rehab. Staff was able to get Sherron home oxygen and he states he is "breathing better". Coltan states that his ex-wife has been supportive and he's feeling better since his last exacerbation. [place]ua has cut back his smoking and looks forward to quitting for good. He states nothing has changed with the relationship with his children nor the finances. He states his rent and prices in general are increasing. Filomeno declined resources on food and housing. Tecumseh has a lot of stress due to his  decline in health and other factors. Since starting the program, he still has financial strain of increased rent. He is trying to quit smoking and is trying to build a relationship back with his kids. He also doesn't see eye to eye with his retirement community. When he attends [facility] he always comes with a complaint or has an uneventful trip. For example, he locked his keys in his car and had to get a ride home from a friend. Then he left his cell phone and wallet in the friend's car. Yesterday he mistakenly had his home O2 on 8L instead of 2L and his tank was empty. He also was upset about the parking at his retirement facility. We have provided and practiced resources with Raymon on stress relief, meditation, tapping, and visualization but to no avail. Devlyn declines a referral to a mental health professional at this time.     Expected Outcomes For Raykwon to attend PR without any psychosocial barriers or concerns. For Leonell to attend PR without any psychosocial barriers or concerns and to use positive self-care techniques to manage his stress. For Jasim to attend PR without any psychosocial barriers or concerns and to use positive self-care techniques to manage his stress.     Interventions Encouraged to attend Pulmonary Rehabilitation for the exercise;Stress management education Encouraged to attend Pulmonary Rehabilitation for the exercise;Stress management education Encouraged to attend Pulmonary Rehabilitation for the exercise;Stress management education     Continue Psychosocial Services  Follow up required by staff Follow up required by staff Follow up required by staff  We will continue to monitor and assess Hillman              Psychosocial Discharge (Final Psychosocial Re-Evaluation):  Psychosocial Re-Evaluation - 11/08/22 1116       Psychosocial Re-Evaluation   Current issues with Current Stress Concerns    Comments Djibril has a lot of stress due to his decline in health and other  factors. Since starting the program, he still has financial strain of increased rent. He is trying to quit smoking and is trying to build a relationship back with his kids. He also doesn't see eye to eye with his retirement community. When he attends [facility] he always comes with a complaint or has an uneventful trip. For example, he locked his keys in his car and had to get a ride home from a friend. Then he left his cell phone and wallet in the friend's car. Yesterday he mistakenly had his home O2 on 8L instead of 2L and his tank was empty. He also was upset about the parking at his retirement facility. We have provided and practiced resources with Hykeem on stress relief, meditation, tapping, and visualization but to no avail. Christipher declines a referral to a mental health professional at this time.    Expected Outcomes For Juana to attend PR without any psychosocial barriers or concerns and to use positive self-care techniques to manage his stress.    Interventions Encouraged to attend Pulmonary Rehabilitation for the exercise;Stress management education    Continue Psychosocial Services  Follow up required by staff   We will continue to monitor and assess Avishai            Education: Education Goals: Education classes will be provided on a weekly basis, covering required topics. Participant will state understanding/return demonstration of topics presented.  Learning Barriers/Preferences:  Learning Barriers/Preferences - 09/13/22 1153       Learning Barriers/Preferences   Learning Barriers Sight;Hearing    Learning Preferences Group Instruction;Individual Instruction;Verbal Instruction;Written Material;Skilled Demonstration             Education Topics: Introduction to Pulmonary Rehab Group instruction provided by PowerPoint, verbal discussion, and written material to support subject matter. Instructor reviews what Pulmonary Rehab is, the purpose of the program, and how patients are  referred.     Know Your Numbers Group instruction that is supported by a PowerPoint presentation. Instructor discusses importance of knowing and understanding resting, exercise, and post-exercise oxygen saturation, heart rate, and blood pressure. Oxygen saturation, heart rate, blood pressure, rating of perceived exertion, and dyspnea are reviewed along with a normal range for these values.  Flowsheet Row PULMONARY REHAB OTHER RESPIRATORY from 09/19/2022 in [facility]  Date 09/19/22  Educator EP  Instruction Review Code 1- Verbalizes Understanding       Exercise for the Pulmonary Patient Group instruction that is supported by a PowerPoint presentation. Instructor discusses benefits of exercise, core components of exercise, frequency, duration, and intensity of an exercise routine, importance of utilizing pulse oximetry during exercise, safety while exercising, and options of places to exercise outside of rehab.  Flowsheet Row PULMONARY REHAB CHRONIC OBSTRUCTIVE PULMONARY DISEASE from 08/06/2018 in [facility]  Date 07/30/18  Educator Kirt Boys  Instruction Review Code 1- Verbalizes Understanding          MET Level  Group instruction provided by PowerPoint, verbal discussion, and written material to support subject matter.  Instructor reviews what METs are and how to increase METs.  Flowsheet Row PULMONARY REHAB OTHER RESPIRATORY from 11/14/2022 in [facility]  Date 11/14/22  Educator EP  Instruction Review Code 1- Verbalizes Understanding       Pulmonary Medications Verbally interactive group education provided by instructor with focus on inhaled medications and proper administration. Flowsheet Row PULMONARY REHAB CHRONIC OBSTRUCTIVE PULMONARY DISEASE from 08/06/2018 in [facility] for  Heart, Vascular, & Lung Health  Date 06/30/18  Educator Victorino Dike  Instruction Review Code 1- Verbalizes Understanding       Anatomy and Physiology of the Respiratory System Group instruction provided by PowerPoint, verbal discussion, and written material to support subject matter. Instructor reviews respiratory cycle and anatomical components of the respiratory system and their functions. Instructor also reviews differences in obstructive and restrictive respiratory diseases with examples of each.  Flowsheet Row PULMONARY REHAB CHRONIC OBSTRUCTIVE PULMONARY DISEASE from 08/06/2018 in [facility] for Heart, Vascular, & Lung Health  Date 07/09/18  Educator RN  Instruction Review Code 1- Verbalizes Understanding       Oxygen Safety Group instruction provided by PowerPoint, verbal discussion, and written material to support subject matter. There is an overview of "What is Oxygen" and "Why do we need it".  Instructor also reviews how to create a safe environment for oxygen use, the importance of using oxygen as prescribed, and the risks of noncompliance. There is a brief discussion on traveling with oxygen and resources the patient may utilize. Flowsheet Row PULMONARY REHAB OTHER RESPIRATORY from 09/26/2022 in [facility] for Heart, Vascular, & Lung Health  Date 09/26/22  Educator EP  Instruction Review Code 1- Verbalizes Understanding       Oxygen Use Group instruction provided by PowerPoint, verbal discussion, and written material to discuss how supplemental oxygen is prescribed and different types of oxygen supply systems. Resources for more information are provided.  Flowsheet Row PULMONARY REHAB OTHER RESPIRATORY from 10/03/2022 in [facility] for Heart, Vascular, & Lung Health  Date 10/03/22  Educator RT  Instruction Review Code 1- Verbalizes Understanding       Breathing Techniques Group instruction that is  supported by demonstration and informational handouts. Instructor discusses the benefits of pursed lip and diaphragmatic breathing and detailed demonstration on how to perform both.  Flowsheet Row PULMONARY REHAB OTHER RESPIRATORY from 10/17/2022 in [facility] for Heart, Vascular, & Lung Health  Date 10/17/22  Educator RT  Instruction Review Code 1- Verbalizes Understanding        Risk Factor Reduction Group instruction that is supported by a PowerPoint presentation. Instructor discusses the definition of a risk factor, different risk factors for pulmonary disease, and how the heart and lungs work together. Flowsheet Row PULMONARY REHAB OTHER RESPIRATORY from 10/31/2022 in [facility] for Heart, Vascular, & Lung Health  Date 10/31/22  Educator EP  Instruction Review Code 1- Verbalizes Understanding       MD Day A group question and answer session with a medical doctor that allows participants to ask questions that relate to their pulmonary disease state.   Nutrition for the Pulmonary Patient Group instruction provided by PowerPoint slides, verbal discussion, and written materials to support subject matter. The instructor gives an explanation and review of healthy diet recommendations, which includes a discussion on weight management, recommendations for fruit and vegetable consumption, as well as  protein, fluid, caffeine, fiber, sodium, sugar, and alcohol. Tips for eating when patients are short of breath are discussed. Flowsheet Row PULMONARY REHAB CHRONIC OBSTRUCTIVE PULMONARY DISEASE from 08/06/2018 in [facility] for Heart, Vascular, & Lung Health  Date 06/18/18  Educator Danne Harbor  Instruction Review Code 1- Verbalizes Understanding        Other Education Group or individual verbal, written, or video instructions that support the educational goals of the pulmonary rehab program. Flowsheet Row PULMONARY REHAB  OTHER RESPIRATORY from 11/07/2022 in [facility] for Heart, Vascular, & Lung Health  Date 11/07/22  Educator RN  Instruction Review Code 1- Verbalizes Understanding        Knowledge Questionnaire Score:  Knowledge Questionnaire Score - 09/13/22 1153       Knowledge Questionnaire Score   Pre Score 12/18             Core Components/Risk Factors/Patient Goals at Admission:  Personal Goals and Risk Factors at Admission - 09/13/22 1019       Core Components/Risk Factors/Patient Goals on Admission    Weight Management Yes;Weight Loss    Admit Weight 188 lb (85.3 kg)    Goal Weight: Short Term 175 lb (79.4 kg)    Expected Outcomes Short Term: Continue to assess and modify interventions until short term weight is achieved;Long Term: Adherence to nutrition and physical activity/exercise program aimed toward attainment of established weight goal;Weight Maintenance: Understanding of the daily nutrition guidelines, which includes 25-35% calories from fat, 7% or less cal from saturated fats, less than 200mg  cholesterol, less than 1.5gm of sodium, & 5 or more servings of fruits and vegetables daily;Weight Loss: Understanding of general recommendations for a balanced deficit meal plan, which promotes 1-2 lb weight loss per week and includes a negative energy balance of [id number] kcal/d;Understanding recommendations for meals to include 15-35% energy as protein, 25-35% energy from fat, 35-60% energy from carbohydrates, less than 200mg  of dietary cholesterol, 20-35 gm of total fiber daily;Understanding of distribution of calorie intake throughout the day with the consumption of 4-5 meals/snacks    Tobacco Cessation Yes    Number of packs per day 1-3 cigs/day    Intervention Assist the participant in steps to quit. Provide individualized education and counseling about committing to Tobacco Cessation, relapse prevention, and pharmacological support that can be provided by  physician.;Education officer, environmental, assist with locating and accessing local/national Quit Smoking programs, and support quit date choice.    Expected Outcomes Short Term: Will demonstrate readiness to quit, by selecting a quit date.;Long Term: Complete abstinence from all tobacco products for at least 12 months from quit date.;Short Term: Will quit all tobacco product use, adhering to prevention of relapse plan.    Improve shortness of breath with ADL's Yes    Intervention Provide education, individualized exercise plan and daily activity instruction to help decrease symptoms of SOB with activities of daily living.    Expected Outcomes Short Term: Improve cardiorespiratory fitness to achieve a reduction of symptoms when performing ADLs;Long Term: Be able to perform more ADLs without symptoms or delay the onset of symptoms    Increase knowledge of respiratory medications and ability to use respiratory devices properly  Yes    Intervention Provide education and demonstration as needed of appropriate use of medications, inhalers, and oxygen therapy.    Expected Outcomes Short Term: Achieves understanding of medications use. Understands that oxygen is a medication prescribed by physician. Demonstrates appropriate use of inhaler and oxygen  therapy.;Long Term: Maintain appropriate use of medications, inhalers, and oxygen therapy.    Stress Yes    Intervention Offer individual and/or small group education and counseling on adjustment to heart disease, stress management and health-related lifestyle change. Teach and support self-help strategies.;Refer participants experiencing significant psychosocial distress to appropriate mental health specialists for further evaluation and treatment. When possible, include family members and significant others in education/counseling sessions.    Expected Outcomes Short Term: Participant demonstrates changes in health-related behavior, relaxation and other stress  management skills, ability to obtain effective social support, and compliance with psychotropic medications if prescribed.;Long Term: Emotional wellbeing is indicated by absence of clinically significant psychosocial distress or social isolation.             Core Components/Risk Factors/Patient Goals Review:   Goals and Risk Factor Review     Row Name 09/16/22 0951 10/14/22 1520 11/08/22 1125         Core Components/Risk Factors/Patient Goals Review   Personal Goals Review Weight Management/Obesity;Tobacco Cessation;Improve shortness of breath with ADL's;Develop more efficient breathing techniques such as purse lipped breathing and diaphragmatic breathing and practicing self-pacing with activity.;Increase knowledge of respiratory medications and ability to use respiratory devices properly.;Stress Weight Management/Obesity;Tobacco Cessation;Improve shortness of breath with ADL's;Develop more efficient breathing techniques such as purse lipped breathing and diaphragmatic breathing and practicing self-pacing with activity.;Increase knowledge of respiratory medications and ability to use respiratory devices properly.;Stress Weight Management/Obesity;Improve shortness of breath with ADL's;Stress;Tobacco Cessation     Review No changes since orientation visit on 09/13/2022. Kypton is scheduled to start his first session on 09/19/2022. Orestes has attended 3 weeks of Pulmonary Rehab. His weight has been stable. He is working with our dietician to lose weight. (See dietitian note). Ngai is also working toward smoking cessation. He has cut back on how many cigarettes he smokes daily. He has met his goal on correctly stating when to use his inhaler and has properly demonstrated it with our respiratory therapist. He has also met his goal of developing more efficient breathing techniques and self-pacing. Jayshon can initiate pursed lip breathing when he is short of breath without staff involvement. He is still working  on improving his shortness of breath but feels like PR is helping. Kailan is progressing on his goal of weight loss. He is down since starting the program. Goal in progress (see dietician note). He is working on improving his shortness of breath with ADLs to get back to where he previously was. Goal in progress. His oxygen saturation has been stable on 3L while exercising. He has met 2 goals of increasing his knowledge of respiratory medications and the ability to use respiratory devices properly as well as developing more efficient breathing techniques such as purse lipped and diaphragmatic breathing and practicing self-pacing with activity. Adom states he is cutting back on smoking, goal in progress. He is also trying to decrease his stress levels, goal in progress. Jaquille states the program isn't helping and is making his breathing worse.     Expected Outcomes Unable to evaluate any progress towards his goals yet. Duance will start the program on 09/19/2022. See admission goals See admission goals              Core Components/Risk Factors/Patient Goals at Discharge (Final Review):   Goals and Risk Factor Review - 11/08/22 1125       Core Components/Risk Factors/Patient Goals Review   Personal Goals Review Weight Management/Obesity;Improve shortness of breath with ADL's;Stress;Tobacco Cessation  Review Dona is progressing on his goal of weight loss. He is down since starting the program. Goal in progress (see dietician note). He is working on improving his shortness of breath with ADLs to get back to where he previously was. Goal in progress. His oxygen saturation has been stable on 3L while exercising. He has met 2 goals of increasing his knowledge of respiratory medications and the ability to use respiratory devices properly as well as developing more efficient breathing techniques such as purse lipped and diaphragmatic breathing and practicing self-pacing with activity. Charistopher states he is  cutting back on smoking, goal in progress. He is also trying to decrease his stress levels, goal in progress. Azaryah states the program isn't helping and is making his breathing worse.    Expected Outcomes See admission goals             ITP Comments:Pt is making expected progress toward Pulmonary Rehab goals after completing 18 sessions. Recommend continued exercise, life style modification, education, and utilization of breathing techniques to increase stamina and strength, while also decreasing shortness of breath with exertion.  Dr. Mechele Collin is Medical Director for Pulmonary Rehab at [facility].

## 2022-11-21 ENCOUNTER — Encounter (HOSPITAL_COMMUNITY)
Admission: RE | Admit: 2022-11-21 | Discharge: 2022-11-21 | Disposition: A | Discharge status: Home / Self Care | Payer: No Typology Code available for payment source | Source: Ambulatory Visit | Attending: Internal Medicine | Admitting: Internal Medicine

## 2022-11-21 ENCOUNTER — Encounter (HOSPITAL_COMMUNITY): Payer: No Typology Code available for payment source

## 2022-11-21 DIAGNOSIS — G4733 Obstructive sleep apnea (adult) (pediatric): Secondary | ICD-10-CM

## 2022-11-21 NOTE — Progress Notes (Signed)
Daily Session Note  Patient Details  Name: Steve Riley MRN: [id number] Date of Birth: 1951-02-20 Referring Provider:   [name] Pulmonary Rehab Walk Test from 09/13/2022 in [facility] for Heart, Vascular, & Lung Health  Referring Provider [name] coverage)       Encounter Date: 11/21/2022  Check In:  Session Check In - 11/21/22 0826       Check-In   Supervising physician immediately available to respond to emergencies [facility] MD immediately available    Physician(s) Eligha Bridegroom, NP    Location [facility]    Staff Present Elissa Lovett BS, ACSM-CEP, Exercise Physiologist;Casey Synthia Innocent, RN, BSN    Virtual Visit No    Medication changes reported     No    Fall or balance concerns reported    No    Tobacco Cessation No Change    Warm-up and Cool-down Performed as group-led instruction    Resistance Training Performed Yes    VAD Patient? No    PAD/SET Patient? No      Pain Assessment   Currently in Pain? No/denies    Multiple Pain Sites No             Capillary Blood Glucose: No results found for this or any previous visit (from the past 24 hour(s)).    Social History   Tobacco Use  Smoking Status Every Day   Packs/day: 0.25   Years: 54.00   Additional pack years: 0.00   Total pack years: 13.50   Types: Cigarettes   Last attempt to quit: 07/11/2016   Years since quitting: 6.3  Smokeless Tobacco Never  Tobacco Comments   occassionally 1-3 cigarettes per day .  Updated 09/05/2022.    Goals Met:  Independence with exercise equipment Exercise tolerated well No report of concerns or symptoms today Strength training completed today  Goals Unmet:  Not Applicable  Comments: Service time is from 0810 to 0933    Dr. Mechele Collin is Medical Director for Pulmonary Rehab at [facility].

## 2022-11-26 ENCOUNTER — Encounter (HOSPITAL_COMMUNITY): Payer: No Typology Code available for payment source

## 2022-11-26 ENCOUNTER — Encounter (HOSPITAL_COMMUNITY)
Admission: RE | Admit: 2022-11-26 | Discharge: 2022-11-26 | Disposition: A | Discharge status: Home / Self Care | Payer: No Typology Code available for payment source | Source: Ambulatory Visit | Attending: Internal Medicine | Admitting: Internal Medicine

## 2022-11-26 DIAGNOSIS — G4733 Obstructive sleep apnea (adult) (pediatric): Secondary | ICD-10-CM | POA: Diagnosis not present

## 2022-11-26 NOTE — Progress Notes (Signed)
Daily Session Note  Patient Details  Name: Steve Riley MRN: [id number] Date of Birth: May 17, 1951 Referring Provider:   [name] Pulmonary Rehab Walk Test from 09/13/2022 in [facility] for Heart, Vascular, & Lung Health  Referring [name] coverage)       Encounter Date: 11/26/2022  Check In:  Session Check In - 11/26/22 0824       Check-In   Supervising physician immediately available to respond to emergencies [facility] MD immediately available    Physician(s) Bernadene Person, NP    Location [facility]    Staff Present Elissa Lovett BS, ACSM-CEP, Exercise Physiologist;Casey Synthia Innocent, RN, Doris Cheadle, MS, ACSM-CEP, Exercise Physiologist    Virtual Visit No    Medication changes reported     No    Fall or balance concerns reported    No    Tobacco Cessation No Change    Warm-up and Cool-down Performed as group-led instruction    Resistance Training Performed Yes    VAD Patient? No    PAD/SET Patient? No      Pain Assessment   Currently in Pain? No/denies    Multiple Pain Sites No             Capillary Blood Glucose: No results found for this or any previous visit (from the past 24 hour(s)).    Social History   Tobacco Use  Smoking Status Every Day   Packs/day: 0.25   Years: 54.00   Additional pack years: 0.00   Total pack years: 13.50   Types: Cigarettes   Last attempt to quit: 07/11/2016   Years since quitting: 6.3  Smokeless Tobacco Never  Tobacco Comments   occassionally 1-3 cigarettes per day .  Updated 09/05/2022.    Goals Met:  Independence with exercise equipment Exercise tolerated well No report of concerns or symptoms today Strength training completed today  Goals Unmet:  Not Applicable  Comments: Service time is from 0817 to 0934.    Dr. Mechele Collin is Medical Director for Pulmonary Rehab at [facility].

## 2022-11-28 ENCOUNTER — Telehealth (HOSPITAL_COMMUNITY): Payer: Self-pay | Admitting: *Deleted

## 2022-11-28 ENCOUNTER — Encounter (HOSPITAL_COMMUNITY): Payer: No Typology Code available for payment source

## 2022-11-28 ENCOUNTER — Telehealth (HOSPITAL_COMMUNITY): Payer: Self-pay

## 2022-11-28 NOTE — Telephone Encounter (Signed)
Called to check on patient since missing Pulmonary Rehab today. Pt stated he was seeing Pulm tomorrow and will be back on Tuesday for class.

## 2022-11-28 NOTE — Telephone Encounter (Signed)
Steve Riley for Pulmonary Rehab that he would miss class today d/t being up coughing all night. Pt has an appointment with Dr. Celine Mans tomorrow.

## 2022-11-29 ENCOUNTER — Ambulatory Visit (INDEPENDENT_AMBULATORY_CARE_PROVIDER_SITE_OTHER): Payer: No Typology Code available for payment source | Admitting: Internal Medicine

## 2022-11-29 ENCOUNTER — Encounter: Payer: Self-pay | Admitting: Internal Medicine

## 2022-11-29 VITALS — BP 124/76 | HR 65 | Temp 97.8°F | Ht 68.5 in | Wt 184.6 lb

## 2022-11-29 DIAGNOSIS — J9611 Chronic respiratory failure with hypoxia: Secondary | ICD-10-CM | POA: Diagnosis not present

## 2022-11-29 DIAGNOSIS — J4489 Other specified chronic obstructive pulmonary disease: Secondary | ICD-10-CM | POA: Diagnosis not present

## 2022-11-29 DIAGNOSIS — J439 Emphysema, unspecified: Secondary | ICD-10-CM

## 2022-11-29 NOTE — Progress Notes (Signed)
Steve Riley    [id number]    02-Feb-1951  Primary Care Physician:Woods, Remi Deter, MD Date of Appointment: 11/29/2022 Established Patient Visit  Chief complaint:   Chief Complaint  Patient presents with   Follow-up    Wheezing, SOB with exertion, cough spells in am      HPI: Steve Riley is a 72 y.o. 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  Chronic respiratory failure 2L with exertion.   Interval Updates: Here  for follow up. Doing pulmonary rehab. He stopped smoking about a month ago.   Doing symbicort and spiriva for maintenance therapy. He is having coughing spells but very little phlegm is actually coming up.  He feels energy is low. He is fatigued.   Using nebulizer machine and this is helping some. His albuterol is expired at home, although I sent some last week.  No fevers or chills, sore throat. Appetite is ok.    I have reviewed the patient's family social and past medical history and updated as appropriate.   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   Procedure: RIGHT TOTAL HIP ARTHROPLASTY ANTERIOR APPROACH;  Surgeon: Kathryne Hitch, MD;  Location: [facility] OR;  Service: Orthopedics;  Laterality: Right;   TOTAL HIP ARTHROPLASTY Left 08/24/2019   TOTAL HIP ARTHROPLASTY Left 08/24/2019   Procedure: LEFT TOTAL HIP ARTHROPLASTY ANTERIOR APPROACH;  Surgeon: Kathryne Hitch, MD;  Location: [facility] OR;  Service: Orthopedics;  Laterality: Left;    Family History  Problem Relation Age of Onset   COPD Mother    COPD Brother    Cancer Neg Hx     Social History   Occupational History   Not on file  Tobacco Use   Smoking status: Former    Packs/day: 0.25    Years:  54.00    Additional pack years: 0.00    Total pack years: 13.50    Types: Cigarettes    Quit date: 07/11/2016    Years since quitting: 6.3   Smokeless tobacco: Never   Tobacco comments:    Pt stopped smoking on Memorial Day weekend 2024. ARJ 11/29/22  Vaping Use   Vaping Use: Never used  Substance and Sexual Activity   Alcohol use: No   Drug use: No   Sexual activity: Not Currently     Physical Exam: Blood pressure 124/76, pulse 65, temperature 97.8 F (36.6 C), temperature source Oral, height 5' 8.5" (1.74 m), weight 184 lb 9.6 oz (83.7 kg), SpO2 95 %.  Gen:      No acute distress Lungs:  diminished, no wheezes or crackles CV:         RRR no mrg Neuro: HOH MSK: unable to raise right arm over the level of shoulder. No joint erythema or swelling  Data Reviewed: Imaging: I have personally reviewed the chest xray 3/28 which shows no acute process, chronic COPD.   PFTs:     Latest Ref Rng & Units 08/13/2021   11:03 AM  PFT Results  FVC-Pre L 3.07   FVC-Predicted Pre % 74   FVC-Post L 3.53   FVC-Predicted Post % 86  Pre FEV1/FVC % % 39   Post FEV1/FCV % % 37   FEV1-Pre L 1.19   FEV1-Predicted Pre % 39   FEV1-Post L 1.29   DLCO uncorrected ml/min/mmHg 11.90   DLCO UNC% % 48   DLCO corrected ml/min/mmHg 11.90   DLCO COR %Predicted % 48   DLVA Predicted % 61   TLC L 7.88   TLC % Predicted % 117   RV % Predicted % 153    I have personally reviewed the patient's PFTs and very severe airflow limitation with air trapping, hyper inflation, and reduced dlco. Very severe emphysema.   Labs: Lab Results  Component Value Date   NA 133 (L) 12/08/2020   K 4.2 12/08/2020   CO2 22 12/08/2020   GLUCOSE 155 (H) 12/08/2020   BUN 10 12/08/2020   CREATININE 0.93 12/08/2020   CALCIUM 8.9 12/08/2020   GFRNONAA >60 12/08/2020   Lab Results  Component Value Date   WBC 4.4 12/08/2020   HGB 16.0 12/08/2020   HCT 47.3 12/08/2020   MCV 87.9 12/08/2020   PLT 196 12/08/2020     Immunization status: Immunization History  Administered Date(s) Administered   Influenza,inj,Quad PF,6+ Mos 05/11/2018   Influenza-Unspecified 06/19/2011, 03/12/2012, 03/24/2013   Pneumococcal Conjugate-13 08/01/2014   Pneumococcal Polysaccharide-23 07/28/2013, 03/07/2018   Td 01/07/2022   Td (Adult),5 Lf Tetanus Toxid, Preservative Free 01/07/2022   Tdap 06/19/2011    External Records Personally Reviewed:   Assessment:  COPD, Very Severe FEV1 39% of predicted with progressing symptoms.  Tobacco use disorder with recent cessation May 2024 Need for lung cancer screening.  Chronic respiratory failure on 2L with exertion Right shoulder pain  Plan/Recommendations: continue breztri, continue albuterol as needed.  Will add flutter valve after nebulizer therapy for best effect.   Continue spiriva and symbicort.   Will obtain ONO on room air.   [facility]   We discussed regular exercise routine. Trial of ibuprofen x 5 days for shoulder pain  Return to Care: Return in about 3 months (around 03/01/2023).   Durel Salts, MD Pulmonary and Critical Care Medicine [facility] [phone]

## 2022-11-29 NOTE — Patient Instructions (Addendum)
Please schedule follow up scheduled with myself in 3 months.  If my schedule is not open yet, we will contact you with a reminder closer to that time. Please call [phone] if you haven't heard from [place] a month before.   Before your next visit I would like you to have:  Overnight oximetry test on room air - ordered this today.   Use flutter valve after breathing treatments (nebulizer) to help you bring up cough.   Agree with using lidocaine patches and tylenol for your shoulder pain. You can also try ibuprofen 600 mg up to three times a day WITH FOOD. I would do this daily schedule for no more than 5 days as it can be hard on your kidneys and your stomach if you take too much too often.   Continue symbicort, spiriva.   Continue pulmonary rehab. You will have to keep the exercise going with walking at home. Try early mornings and after the sun sets to avoid the heat.   Congratulations on quitting smoking! Keep up the good work.

## 2022-12-03 ENCOUNTER — Encounter (HOSPITAL_COMMUNITY)
Admission: RE | Admit: 2022-12-03 | Discharge: 2022-12-03 | Disposition: A | Discharge status: Home / Self Care | Payer: No Typology Code available for payment source | Source: Ambulatory Visit | Attending: Internal Medicine | Admitting: Internal Medicine

## 2022-12-03 ENCOUNTER — Encounter (HOSPITAL_COMMUNITY): Payer: No Typology Code available for payment source

## 2022-12-03 VITALS — Wt 186.1 lb

## 2022-12-03 DIAGNOSIS — G4733 Obstructive sleep apnea (adult) (pediatric): Secondary | ICD-10-CM | POA: Diagnosis not present

## 2022-12-03 NOTE — Progress Notes (Signed)
Daily Session Note  Patient Details  Name: Steve Riley MRN: [id number] Date of Birth: 05/08/1951 Referring Provider:   [name] Pulmonary Rehab Walk Test from 09/13/2022 in [facility], V[facility], & [facility]  Referring Provider [name] coverage)       Encounter Date: 12/03/2022  Check In:  Session Check In - 12/03/22 0843       Check-In   Supervising physician immediately available to respond to emergencies [facility] MD immediately available    Physician(s) Edd Fabian, NP    Location [facility]    Staff Present Essie Hart, RN, Doris Cheadle, MS, ACSM-CEP, Exercise Physiologist;Sedrick Tober Katrinka Blazing, RT    Virtual Visit No    Medication changes reported     No    Fall or balance concerns reported    No    Tobacco Cessation No Change    Warm-up and Cool-down Performed as group-led instruction    Resistance Training Performed Yes    VAD Patient? No    PAD/SET Patient? No      Pain Assessment   Currently in Pain? No/denies    Multiple Pain Sites No             Capillary Blood Glucose: No results found for this or any previous visit (from the past 24 hour(s)).   Exercise Prescription Changes - 12/03/22 0900       Response to Exercise   Blood Pressure (Admit) 140/68    Blood Pressure (Exercise) 134/72    Blood Pressure (Exit) 132/66    Heart Rate (Admit) 76 bpm    Heart Rate (Exercise) 97 bpm    Heart Rate (Exit) 79 bpm    Oxygen Saturation (Admit) 97 %    Oxygen Saturation (Exercise) 93 %    Oxygen Saturation (Exit) 95 %    Rating of Perceived Exertion (Exercise) 15    Perceived Dyspnea (Exercise) 3    Duration Continue with 30 min of aerobic exercise without signs/symptoms of physical distress.    Intensity THRR unchanged      Progression   Progression Continue to progress workloads to maintain intensity without signs/symptoms of physical distress.      Resistance Training   Training Prescription Yes     Weight blue bands    Reps 10-15    Time 10 Minutes      Oxygen   Oxygen Continuous    Liters 2      NuStep   Level 3    Minutes 15    METs 2.1      Track   Laps 11    Minutes 15    METs 2.69      Oxygen   Maintain Oxygen Saturation 88% or higher             Social History   Tobacco Use  Smoking Status Former   Packs/day: 0.25   Years: 54.00   Additional pack years: 0.00   Total pack years: 13.50   Types: Cigarettes   Quit date: 07/11/2016   Years since quitting: 6.4  Smokeless Tobacco Never  Tobacco Comments   Pt stopped smoking on Memorial Day weekend 2024. ARJ 11/29/22    Goals Met:  Proper associated with RPD/PD & O2 Sat Independence with exercise equipment Exercise tolerated well No report of concerns or symptoms today Strength training completed today  Goals Unmet:  Not Applicable  Comments: Service time is from 0812 to 0934.  Dr. Jane Ellison is Medical Director for Pulmonary Rehab at [facility].  

## 2022-12-05 ENCOUNTER — Encounter (HOSPITAL_COMMUNITY)
Admission: RE | Admit: 2022-12-05 | Discharge: 2022-12-05 | Disposition: A | Discharge status: Home / Self Care | Payer: No Typology Code available for payment source | Source: Ambulatory Visit | Attending: Internal Medicine | Admitting: Internal Medicine

## 2022-12-05 ENCOUNTER — Encounter (HOSPITAL_COMMUNITY): Payer: No Typology Code available for payment source

## 2022-12-05 DIAGNOSIS — G4733 Obstructive sleep apnea (adult) (pediatric): Secondary | ICD-10-CM

## 2022-12-05 NOTE — Progress Notes (Signed)
Daily Session Note  Patient Details  Name: Steve Riley MRN: [id number] Date of Birth: 18-Nov-1950 Referring Provider:   [name] Pulmonary Rehab Walk Test from 09/13/2022 in [facility], V[facility], & [facility]  Referring [name] coverage)       Encounter Date: 12/05/2022  Check In:  Session Check In - 12/05/22 0846       Check-In   Supervising physician immediately available to respond to emergencies [facility] MD immediately available    Physician(s) Robin Searing, NP    Location [facility]    Staff Present Samantha Belarus, RD, Dutch Gray, RN, BSN;Randi Idelle Crouch BS, ACSM-CEP, Exercise Physiologist;Leannah Guse Earlene Plater, MS, ACSM-CEP, Exercise Physiologist;Casey Katrinka Blazing, RT    Virtual Visit No    Medication changes reported     No    Fall or balance concerns reported    No    Tobacco Cessation No Change    Warm-up and Cool-down Performed as group-led instruction    Resistance Training Performed Yes    VAD Patient? No    PAD/SET Patient? No      Pain Assessment   Currently in Pain? No/denies    Multiple Pain Sites No             Capillary Blood Glucose: No results found for this or any previous visit (from the past 24 hour(s)).    Social History   Tobacco Use  Smoking Status Former   Packs/day: 0.25   Years: 54.00   Additional pack years: 0.00   Total pack years: 13.50   Types: Cigarettes   Quit date: 07/11/2016   Years since quitting: 6.4  Smokeless Tobacco Never  Tobacco Comments   Pt stopped smoking on Memorial Day weekend 2024. ARJ 11/29/22    Goals Met:  Proper associated with RPD/PD & O2 Sat Exercise tolerated well No report of concerns or symptoms today Strength training completed today  Goals Unmet:  Not Applicable  Comments: Service time is from 0811 to 0941.    Dr. Mechele Collin is Medical Director for Pulmonary Rehab at [facility].

## 2022-12-10 ENCOUNTER — Encounter (HOSPITAL_COMMUNITY): Payer: No Typology Code available for payment source

## 2022-12-10 ENCOUNTER — Encounter (HOSPITAL_COMMUNITY)
Admission: RE | Admit: 2022-12-10 | Discharge: 2022-12-10 | Disposition: A | Discharge status: Home / Self Care | Payer: No Typology Code available for payment source | Source: Ambulatory Visit | Attending: Internal Medicine | Admitting: Internal Medicine

## 2022-12-10 DIAGNOSIS — G4733 Obstructive sleep apnea (adult) (pediatric): Secondary | ICD-10-CM | POA: Insufficient documentation

## 2022-12-10 NOTE — Progress Notes (Signed)
Daily Session Note  Patient Details  Name: Steve Riley MRN: [id number] Date of Birth: 07/02/1950 Referring Provider:   [name] Pulmonary Rehab Walk Test from 09/13/2022 in [facility] for [facility], V[facility], & [facility]  Referring [name] coverage)       Encounter Date: 12/10/2022  Check In:  Session Check In - 12/10/22 0901       Check-In   Supervising physician immediately available to respond to emergencies [facility] MD immediately available    Physician(s) Robin Searing, NP    Location [facility]    Staff Present Essie Hart, RN, BSN;Randi Idelle Crouch BS, ACSM-CEP, Exercise Physiologist;Dalia Jollie Earlene Plater, MS, ACSM-CEP, Exercise Physiologist    Virtual Visit No    Medication changes reported     No    Fall or balance concerns reported    No    Tobacco Cessation No Change    Warm-up and Cool-down Performed as group-led instruction    Resistance Training Performed Yes    VAD Patient? No    PAD/SET Patient? No      Pain Assessment   Currently in Pain? No/denies    Multiple Pain Sites No             Capillary Blood Glucose: No results found for this or any previous visit (from the past 24 hour(s)).    Social History   Tobacco Use  Smoking Status Former   Packs/day: 0.25   Years: 54.00   Additional pack years: 0.00   Total pack years: 13.50   Types: Cigarettes   Quit date: 07/11/2016   Years since quitting: 6.4  Smokeless Tobacco Never  Tobacco Comments   Pt stopped smoking on Memorial Day weekend 2024. ARJ 11/29/22    Goals Met:  Proper associated with RPD/PD & O2 Sat Exercise tolerated well No report of concerns or symptoms today Strength training completed today  Goals Unmet:  Not Applicable  Comments: Service time is from 0823 to 0945.    Dr. Mechele Collin is Medical Director for Pulmonary Rehab at [facility].

## 2022-12-17 ENCOUNTER — Encounter (HOSPITAL_COMMUNITY)
Admission: RE | Admit: 2022-12-17 | Discharge: 2022-12-17 | Disposition: A | Discharge status: Home / Self Care | Payer: No Typology Code available for payment source | Source: Ambulatory Visit | Attending: Internal Medicine | Admitting: Internal Medicine

## 2022-12-17 VITALS — Wt 179.9 lb

## 2022-12-17 DIAGNOSIS — G4733 Obstructive sleep apnea (adult) (pediatric): Secondary | ICD-10-CM | POA: Diagnosis not present

## 2022-12-17 NOTE — Progress Notes (Signed)
Daily Session Note  Patient Details  Name: Steve Riley MRN: [id number] Date of Birth: 10/26/1950 Referring Provider:   [name] Pulmonary Rehab Walk Test from 09/13/2022 in [facility], [facility], [facility]  Referring [name] coverage)       Encounter Date: 12/17/2022  Check In:  Session Check In - 12/17/22 0831       Check-In   Supervising physician immediately available to respond to emergencies [facility] MD immediately available    Physician(s) Carlyon Shadow, NP    Location [facility]    Staff Present Essie Hart, RN, Doris Cheadle, MS, ACSM-CEP, Exercise Physiologist;Casey Katrinka Blazing, RT    Virtual Visit No    Medication changes reported     No    Fall or balance concerns reported    No    Tobacco Cessation No Change    Warm-up and Cool-down Performed as group-led instruction    Resistance Training Performed Yes    VAD Patient? No    PAD/SET Patient? No      Pain Assessment   Currently in Pain? No/denies    Multiple Pain Sites No             Capillary Blood Glucose: No results found for this or any previous visit (from the past 24 hour(s)).   Exercise Prescription Changes - 12/17/22 0900       Response to Exercise   Blood Pressure (Admit) 126/70    Blood Pressure (Exercise) 136/74    Blood Pressure (Exit) 114/70    Heart Rate (Admit) 96 bpm    Heart Rate (Exercise) 112 bpm    Heart Rate (Exit) 94 bpm    Oxygen Saturation (Admit) 94 %    Oxygen Saturation (Exercise) 85 %    Oxygen Saturation (Exit) 92 %    Rating of Perceived Exertion (Exercise) 11.5    Perceived Dyspnea (Exercise) 3    Duration Continue with 30 min of aerobic exercise without signs/symptoms of physical distress.    Intensity THRR unchanged      Progression   Progression Continue to progress workloads to maintain intensity without signs/symptoms of physical distress.      Resistance Training   Training  Prescription Yes    Weight blue bands    Reps 10-15    Time 10 Minutes      Oxygen   Oxygen Continuous    Liters 2      NuStep   Level 3    Minutes 15    METs 2.5      Oxygen   Maintain Oxygen Saturation 88% or higher             Social History   Tobacco Use  Smoking Status Former   Packs/day: 0.25   Years: 54.00   Additional pack years: 0.00   Total pack years: 13.50   Types: Cigarettes   Quit date: 07/11/2016   Years since quitting: 6.4  Smokeless Tobacco Never  Tobacco Comments   Pt stopped smoking on Memorial Day weekend 2024. ARJ 11/29/22    Goals Met:  Proper associated with RPD/PD & O2 Sat Exercise tolerated well No report of concerns or symptoms today Strength training completed today  Goals Unmet:  Not Applicable  Comments: Service time is from 0811 to 0945.    Dr. Mechele Collin is Medical Director for Pulmonary Rehab at [facility].

## 2022-12-18 NOTE — Progress Notes (Signed)
Pulmonary Individual Treatment Plan  Patient Details  Name: Steve Riley MRN: [id number] Date of Birth: 1950-09-15 Referring Provider:   [name] Pulmonary Rehab Walk Test from 09/13/2022 in [facility]  [facility]erring [name] coverage)       Initial Encounter Date:  Flowsheet Row Pulmonary Rehab Walk Test from 09/13/2022 in [facility]  Date 09/13/22       Visit Diagnosis: OSA (obstructive sleep apnea)  Patient's Home Medications on Admission:   Current Outpatient Medications:    albuterol (PROVENTIL) (2.5 MG/3ML) 0.083% nebulizer solution, Take 3 mLs (2.5 mg total) by nebulization every 6 (six) hours as needed for wheezing or shortness of breath., Disp: 75 mL, Rfl: 5   albuterol (PROVENTIL) (2.5 MG/3ML) 0.083% nebulizer solution, Take 3 mLs (2.5 mg total) by nebulization every 6 (six) hours as needed for wheezing or shortness of breath. (Patient not taking: Reported on 11/29/2022), Disp: 120 mL, Rfl: 11   albuterol (VENTOLIN HFA) 108 (90 Base) MCG/ACT inhaler, Inhale 1-2 puffs into the lungs every 6 (six) hours as needed for wheezing., Disp: 18 g, Rfl: 4   APPLE CIDER VINEGAR PO, Take by mouth in the morning and at bedtime. , Disp: , Rfl:    Ascorbic Acid (VITAMIN C WITH ROSE HIPS) 1000 MG tablet, Take 1,000 mg by mouth daily., Disp: , Rfl:    aspirin 81 MG chewable tablet, Chew 1 tablet (81 mg total) by mouth 2 (two) times daily. (Patient not taking: Reported on 02/14/2022), Disp: 60 tablet, Rfl: 0   benzonatate (TESSALON PERLES) 100 MG capsule, Take 1 capsule (100 mg total) by mouth 2 (two) times daily as needed for cough. (Patient not taking: Reported on 11/29/2022), Disp: 60 capsule, Rfl: 2   budesonide-formoterol (SYMBICORT) 160-4.5 MCG/ACT inhaler, Inhale 2 puffs into the lungs 2 (two) times daily., Disp: 30.6 each, Rfl: 5   Calcium Carbonate-Vitamin D  600-400 MG-UNIT chew tablet, Chew 1 tablet by mouth 2 (two) times daily. , Disp: , Rfl:    Cholecalciferol (DIALYVITE VITAMIN D 5000) 125 MCG (5000 UT) capsule, Take 5,000 Units by mouth daily., Disp: , Rfl:    Cyanocobalamin (B-12) 5000 MCG CAPS, Take 5,000 mcg by mouth daily., Disp: , Rfl:    Flaxseed, Linseed, (FLAX PO), Take 2,600 mg by mouth daily., Disp: , Rfl:    folic acid (FOLVITE) 1 MG tablet, Take 1 tablet by mouth daily., Disp: , Rfl:    methocarbamol (ROBAXIN) 500 MG tablet, Take 1 tablet (500 mg total) by mouth every 6 (six) hours as needed for muscle spasms. (Patient not taking: Reported on 02/14/2022), Disp: 60 tablet, Rfl: 0   Methylsulfonylmethane (MSM) 1000 MG CAPS, Take 1,000 mg by mouth 2 (two) times daily., Disp: , Rfl:    ondansetron (ZOFRAN) 4 MG tablet, Take 1 tablet (4 mg total) by mouth every 8 (eight) hours as needed for up to 20 doses for nausea or vomiting. (Patient not taking: Reported on 02/14/2022), Disp: 20 tablet, Rfl: 0   OVER THE COUNTER MEDICATION, Take 4,000 Units by mouth in the morning and at bedtime. Serrapeptase, Disp: , Rfl:    oxyCODONE (OXY IR/ROXICODONE) 5 MG immediate release tablet, Take 1-2 tablets (5-10 mg total) by mouth every 4 (four) hours as needed for moderate pain (pain score 4-6). (Patient not taking: Reported on 02/14/2022), Disp: 30 tablet, Rfl: 0   predniSONE (DELTASONE) 20 MG tablet,  Take 2 tablets (40 mg total) by mouth daily with breakfast., Disp: 10 tablet, Rfl: 0   Probiotic CAPS, Take 1 capsule by mouth 2 (two) times daily with a meal., Disp: , Rfl:    selenium 200 MCG TABS tablet, Take 200 mcg by mouth daily., Disp: , Rfl:    silver sulfADIAZINE (SILVADENE) 1 % cream, Apply 1 application  topically in the morning, at noon, and at bedtime., Disp: , Rfl:    Tiotropium Bromide Monohydrate (SPIRIVA RESPIMAT) 2.5 MCG/ACT AERS, Inhale 1 puff into the lungs in the morning and at bedtime., Disp: 12 g, Rfl: 2   traZODone (DESYREL) 100 MG tablet,  Take 100 mg by mouth at bedtime. (Patient not taking: Reported on 02/14/2022), Disp: , Rfl:    Turmeric Curcumin 500 MG CAPS, Take 500 mg by mouth in the morning and at bedtime.  (Patient not taking: Reported on 09/05/2022), Disp: , Rfl:    Zinc 50 MG TABS, Take 50 mg by mouth daily., Disp: , Rfl:   Past Medical History: Past Medical History:  Diagnosis Date   COPD (chronic obstructive pulmonary disease) (HCC)    Pneumonia    Prostate cancer (HCC)     Tobacco Use: Social History   Tobacco Use  Smoking Status Former   Packs/day: 0.25   Years: 54.00   Additional pack years: 0.00   Total pack years: 13.50   Types: Cigarettes   Quit date: 07/11/2016   Years since quitting: 6.4  Smokeless Tobacco Never  Tobacco Comments   Pt stopped smoking on Memorial Day weekend 2024. [facility] 11/29/22    Labs: Review Flowsheet       Latest Ref Rng & Units 03/06/2018 03/07/2018  Labs for ITP Cardiac and Pulmonary Rehab  Hemoglobin A1c 4.8 - 5.6 % - 6.5   Bicarbonate 20.0 - 28.0 mmol/L 22.9  -  TCO2 22 - 32 mmol/L 24  -  Acid-base deficit 0.0 - 2.0 mmol/L 1.0  -  O2 Saturation % 81.0  -    Capillary Blood Glucose: Lab Results  Component Value Date   GLUCAP 92 08/18/2019   GLUCAP 86 03/09/2018   GLUCAP 104 (H) 03/09/2018   GLUCAP 139 (H) 03/08/2018   GLUCAP 225 (H) 03/07/2018     Pulmonary Assessment Scores:  Pulmonary Assessment Scores     Row Name 09/13/22 1037         ADL UCSD   SOB Score total 58       CAT Score   CAT Score 19       mMRC Score   mMRC Score 4             UCSD: Self-administered rating of dyspnea associated with activities of daily living (ADLs) 6-point scale (0 = "not at all" to 5 = "maximal or unable to do because of breathlessness")  Scoring Scores range from 0 to 120.  Minimally important difference is 5 units  CAT: CAT can identify the health impairment of COPD patients and is better correlated with disease progression.  CAT has a scoring range of  zero to 40. The CAT score is classified into four groups of low (less than 10), medium (10 - 20), high (21-30) and very high (31-40) based on the impact level of disease on health status. A CAT score over 10 suggests significant symptoms.  A worsening CAT score could be explained by an exacerbation, poor medication adherence, poor inhaler technique, or progression of COPD or comorbid conditions.  CAT MCID  is 2 points  mMRC: mMRC (Modified Medical Research Council) Dyspnea Scale is used to assess the degree of baseline functional disability in patients of respiratory disease due to dyspnea. No minimal important difference is established. A decrease in score of 1 point or greater is considered a positive change.   Pulmonary Function Assessment:  Pulmonary Function Assessment - 09/13/22 1013       Breath   Bilateral Breath Sounds Clear   Expiratory wheezes with exertion   Shortness of Breath Yes;Limiting activity;Panic with Shortness of Breath             Exercise Target Goals: Exercise Program Goal: Individual exercise prescription set using results from initial 6 min walk test and THRR while considering  patient's activity barriers and safety.   Exercise Prescription Goal: Initial exercise prescription builds to 30-45 minutes a day of aerobic activity, 2-3 days per week.  Home exercise guidelines will be given to patient during program as part of exercise prescription that the participant will acknowledge.  Activity Barriers & Risk Stratification:  Activity Barriers & Cardiac Risk Stratification - 09/13/22 1008       Activity Barriers & Cardiac Risk Stratification   Activity Barriers Left Hip Replacement;Right Hip Replacement;Shortness of Breath;Muscular Weakness;Deconditioning;Balance Concerns             6 Minute Walk:  6 Minute Walk     Row Name 09/13/22 1147 12/17/22 0901       6 Minute Walk   Phase Initial Discharge    Distance 544 feet 1515 feet    Distance %  Change -- 178.49 %    Distance Feet Change -- 971 ft    Walk Time 6 minutes 6 minutes    # of Rest Breaks 1  5:07-6:00 0    MPH 1.03 2.87    METS 1.5 3.46    RPE 17 11    Perceived Dyspnea  3 1    VO2 Peak 5.24 12.12    Symptoms Yes (comment) No    Comments Pt had to stop and rest at 5 min with accessory muscle use and sats dropping to 79%. O2 was palced @ 2L with sats recovering to 94% after O2 placed. --    Resting HR 69 bpm 88 bpm    Resting BP 140/68 --    Resting Oxygen Saturation  94 % 98 %    Exercise Oxygen Saturation  during 6 min walk 79 % 85 %    Max Ex. HR 96 bpm 112 bpm    Max Ex. BP 154/70 152/80    2 Minute Post BP 138/70 132/70      Interval HR   1 Minute HR 79 104    2 Minute HR 84 107    3 Minute HR 88 111    4 Minute HR 96 110    5 Minute HR 86 112    6 Minute HR 89 111    2 Minute Post HR 77 100    Interval Heart Rate? Yes Yes      Interval Oxygen   Interval Oxygen? -- Yes    Baseline Oxygen Saturation % 94 % 98 %    1 Minute Oxygen Saturation % 94 % 97 %    1 Minute Liters of Oxygen 0 L 2 L    2 Minute Oxygen Saturation % 95 % 93 %  1:48 85%    2 Minute Liters of Oxygen 0 L 2 L  3L  3 Minute Oxygen Saturation % 92 % 88 %    3 Minute Liters of Oxygen 0 L 3 L    4 Minute Oxygen Saturation % 95 % 92 %    4 Minute Liters of Oxygen 0 L 3 L    5 Minute Oxygen Saturation % 79 %  placed pt on 2L 94 %    5 Minute Liters of Oxygen 0 L 3 L    6 Minute Oxygen Saturation % 94 % 95 %    6 Minute Liters of Oxygen 2 L 3 L    2 Minute Post Oxygen Saturation % 96 % 98 %    2 Minute Post Liters of Oxygen 0 L 3 L             Oxygen Initial Assessment:  Oxygen Initial Assessment - 09/13/22 1012       Home Oxygen   Home Oxygen Device None    Sleep Oxygen Prescription None    Home Exercise Oxygen Prescription None    Home Resting Oxygen Prescription None      Initial 6 min Walk   Oxygen Used Continuous    Liters per minute 2      Program Oxygen  Prescription   Program Oxygen Prescription Continuous    Liters per minute 2    Comments During walk test on RA, pt desaturated down to 79%. Placed on 2L [place].      Intervention   Short Term Goals To learn and exhibit compliance with exercise, home and travel O2 prescription;To learn and understand importance of maintaining oxygen saturations>88%;To learn and demonstrate proper use of respiratory medications;To learn and understand importance of monitoring SPO2 with pulse oximeter and demonstrate accurate use of the pulse oximeter.;To learn and demonstrate proper pursed lip breathing techniques or other breathing techniques.     Long  Term Goals Exhibits compliance with exercise, home  and travel O2 prescription;Maintenance of O2 saturations>88%;Compliance with respiratory medication;Verbalizes importance of monitoring SPO2 with pulse oximeter and return demonstration;Exhibits proper breathing techniques, such as pursed lip breathing or other method taught during program session;Demonstrates proper use of MDI's             Oxygen Re-Evaluation:  Oxygen Re-Evaluation     Row Name 09/13/22 1012 09/16/22 0908 10/16/22 1155 11/11/22 1135 12/13/22 1127     Program Oxygen Prescription   Program Oxygen Prescription -- Continuous Continuous Continuous Continuous   Liters per minute -- 2 2 2 2      Home Oxygen   Home Oxygen Device -- None None None None   Sleep Oxygen Prescription -- None None None None   Home Exercise Oxygen Prescription -- None None None None   Home Resting Oxygen Prescription -- None None None None     Goals/Expected Outcomes   Short Term Goals -- To learn and exhibit compliance with exercise, home and travel O2 prescription;To learn and understand importance of maintaining oxygen saturations>88%;To learn and demonstrate proper use of respiratory medications;To learn and understand importance of monitoring SPO2 with pulse oximeter and demonstrate accurate use of the pulse  oximeter.;To learn and demonstrate proper pursed lip breathing techniques or other breathing techniques.  To learn and exhibit compliance with exercise, home and travel O2 prescription;To learn and understand importance of maintaining oxygen saturations>88%;To learn and demonstrate proper use of respiratory medications;To learn and understand importance of monitoring SPO2 with pulse oximeter and demonstrate accurate use of the pulse oximeter.;To learn and demonstrate proper pursed lip breathing techniques  or other breathing techniques.  To learn and exhibit compliance with exercise, home and travel O2 prescription;To learn and understand importance of maintaining oxygen saturations>88%;To learn and demonstrate proper use of respiratory medications;To learn and understand importance of monitoring SPO2 with pulse oximeter and demonstrate accurate use of the pulse oximeter.;To learn and demonstrate proper pursed lip breathing techniques or other breathing techniques.  To learn and exhibit compliance with exercise, home and travel O2 prescription;To learn and understand importance of maintaining oxygen saturations>88%;To learn and demonstrate proper use of respiratory medications;To learn and understand importance of monitoring SPO2 with pulse oximeter and demonstrate accurate use of the pulse oximeter.;To learn and demonstrate proper pursed lip breathing techniques or other breathing techniques.    Long  Term Goals -- Exhibits compliance with exercise, home  and travel O2 prescription;Maintenance of O2 saturations>88%;Compliance with respiratory medication;Verbalizes importance of monitoring SPO2 with pulse oximeter and return demonstration;Exhibits proper breathing techniques, such as pursed lip breathing or other method taught during program session;Demonstrates proper use of MDI's Exhibits compliance with exercise, home  and travel O2 prescription;Maintenance of O2 saturations>88%;Compliance with respiratory  medication;Verbalizes importance of monitoring SPO2 with pulse oximeter and return demonstration;Exhibits proper breathing techniques, such as pursed lip breathing or other method taught during program session;Demonstrates proper use of MDI's Exhibits compliance with exercise, home  and travel O2 prescription;Maintenance of O2 saturations>88%;Compliance with respiratory medication;Verbalizes importance of monitoring SPO2 with pulse oximeter and return demonstration;Exhibits proper breathing techniques, such as pursed lip breathing or other method taught during program session;Demonstrates proper use of MDI's Exhibits compliance with exercise, home  and travel O2 prescription;Maintenance of O2 saturations>88%;Compliance with respiratory medication;Verbalizes importance of monitoring SPO2 with pulse oximeter and return demonstration;Exhibits proper breathing techniques, such as pursed lip breathing or other method taught during program session;Demonstrates proper use of MDI's   Comments [facility] to [facility] home O2 -- -- --   Goals/Expected Outcomes Compliance with home oxygen when obtained. Compliance and understanding of oxygen saturation monitoring and breathing techniques to decrease shortness of breath. Compliance and understanding of oxygen saturation monitoring and breathing techniques to decrease shortness of breath. Compliance and understanding of oxygen saturation monitoring and breathing techniques to decrease shortness of breath. Compliance and understanding of oxygen saturation monitoring and breathing techniques to decrease shortness of breath.            Oxygen Discharge (Final Oxygen Re-Evaluation):  Oxygen Re-Evaluation - 12/13/22 1127       Program Oxygen Prescription   Program Oxygen Prescription Continuous    Liters per minute 2      Home Oxygen   Home Oxygen Device None    Sleep Oxygen Prescription None    Home Exercise Oxygen  Prescription None    Home Resting Oxygen Prescription None      Goals/Expected Outcomes   Short Term Goals To learn and exhibit compliance with exercise, home and travel O2 prescription;To learn and understand importance of maintaining oxygen saturations>88%;To learn and demonstrate proper use of respiratory medications;To learn and understand importance of monitoring SPO2 with pulse oximeter and demonstrate accurate use of the pulse oximeter.;To learn and demonstrate proper pursed lip breathing techniques or other breathing techniques.     Long  Term Goals Exhibits compliance with exercise, home  and travel O2 prescription;Maintenance of O2 saturations>88%;Compliance with respiratory medication;Verbalizes importance of monitoring SPO2 with pulse oximeter and return demonstration;Exhibits proper breathing techniques, such as pursed lip breathing or other method taught during program  session;Demonstrates proper use of MDI's    Goals/Expected Outcomes Compliance and understanding of oxygen saturation monitoring and breathing techniques to decrease shortness of breath.             Initial Exercise Prescription:  Initial Exercise Prescription - 09/13/22 1100       Date of Initial Exercise RX and Referring Provider   Date 09/13/22    [facility]erring [name] coverage)    Expected Discharge Date 12/10/22      Oxygen   Oxygen Intermittent    Liters 2    Maintain Oxygen Saturation 88% or higher      NuStep   Level 1    SPM 60    Minutes 30      Prescription Details   Frequency (times per week) 2    Duration Progress to 30 minutes of continuous aerobic without signs/symptoms of physical distress      Intensity   THRR 40-80% of Max Heartrate 59-118    Ratings of Perceived Exertion 11-13    Perceived Dyspnea 0-4      Progression   Progression Continue to progress workloads to maintain intensity without signs/symptoms of physical distress.      Resistance Training   Training  Prescription Yes    Reps 10-15             Perform Capillary Blood Glucose checks as needed.  Exercise Prescription Changes:   Exercise Prescription Changes     Row Name 09/24/22 1200 10/08/22 1200 10/22/22 1200 11/05/22 1200 11/19/22 0900     Response to Exercise   Blood Pressure (Admit) 128/60 122/58 114/62 126/70 130/70   Blood Pressure (Exercise) 110/78 124/66 130/70 140/72 156/78   Blood Pressure (Exit) 120/70 122/74 122/64 130/80 132/70   Heart Rate (Admit) 81 bpm 69 bpm 92 bpm 82 bpm 70 bpm   Heart Rate (Exercise) 87 bpm 92 bpm 101 bpm 95 bpm 100 bpm   Heart Rate (Exit) 88 bpm 73 bpm 78 bpm 88 bpm 82 bpm   Oxygen Saturation (Admit) 94 % 94 % 95 % 94 % 94 %   Oxygen Saturation (Exercise) 94 % 94 % 94 % 92 % 92 %   Oxygen Saturation (Exit) 95 % 94 % 96 % 92 % 95 %   Rating of Perceived Exertion (Exercise) 13 13 11 13 13    Perceived Dyspnea (Exercise) 1 2 1 1 2    Duration Continue with 30 min of aerobic exercise without signs/symptoms of physical distress. Continue with 30 min of aerobic exercise without signs/symptoms of physical distress. Continue with 30 min of aerobic exercise without signs/symptoms of physical distress. Continue with 30 min of aerobic exercise without signs/symptoms of physical distress. Continue with 30 min of aerobic exercise without signs/symptoms of physical distress.   Intensity THRR unchanged THRR unchanged THRR unchanged THRR unchanged THRR unchanged     Progression   Progression Continue to progress workloads to maintain intensity without signs/symptoms of physical distress. Continue to progress workloads to maintain intensity without signs/symptoms of physical distress. Continue to progress workloads to maintain intensity without signs/symptoms of physical distress. Continue to progress workloads to maintain intensity without signs/symptoms of physical distress. Continue to progress workloads to maintain intensity without signs/symptoms of physical  distress.   Average METs -- -- -- -- 2.5     Resistance Training   Training Prescription Yes Yes Yes Yes Yes   Weight red bands red bands red bands red bands black bands   Reps 10-15  10-15 10-15 10-15 10-15   Time 10 Minutes 10 Minutes 10 Minutes 10 Minutes 10 Minutes     Oxygen   Oxygen Continuous Continuous Continuous Continuous Continuous   Liters 2 2 2 2 2      NuStep   Level 1 2 2 2 3    SPM 60 60 -- -- 60   Minutes 30 15 15 15 15    METs 1.8 1.9 2 1.9 2.1     Track   Laps -- 8 11 8  10.5   Minutes -- 15 15 15 15    METs -- 2.23 2.69 2.23 2.6     Home Exercise Plan   Plans to continue exercise at -- -- -- -- [employer] (comment)   Frequency -- -- -- -- Add 1 additional day to program exercise sessions.   Initial Home Exercises Provided -- -- -- -- 11/19/22     Oxygen   Maintain Oxygen Saturation 88% or higher 88% or higher 88% or higher 88% or higher 88% or higher    Row Name 12/03/22 0900 12/17/22 0900           Response to Exercise   Blood Pressure (Admit) 140/68 126/70      Blood Pressure (Exercise) 134/72 136/74      Blood Pressure (Exit) 132/66 114/70      Heart Rate (Admit) 76 bpm 96 bpm      Heart Rate (Exercise) 97 bpm 112 bpm      Heart Rate (Exit) 79 bpm 94 bpm      Oxygen Saturation (Admit) 97 % 94 %      Oxygen Saturation (Exercise) 93 % 85 %      Oxygen Saturation (Exit) 95 % 92 %      Rating of Perceived Exertion (Exercise) 15 11.5      Perceived Dyspnea (Exercise) 3 3      Duration Continue with 30 min of aerobic exercise without signs/symptoms of physical distress. Continue with 30 min of aerobic exercise without signs/symptoms of physical distress.      Intensity THRR unchanged THRR unchanged        Progression   Progression Continue to progress workloads to maintain intensity without signs/symptoms of physical distress. Continue to progress workloads to maintain intensity without signs/symptoms of physical distress.        Resistance  Training   Training Prescription Yes Yes      Weight blue bands blue bands      Reps 10-15 10-15      Time 10 Minutes 10 Minutes        Oxygen   Oxygen Continuous Continuous      Liters 2 2        NuStep   Level 3 3      Minutes 15 15      METs 2.1 2.5        Track   Laps 11 --      Minutes 15 --      METs 2.69 --        Oxygen   Maintain Oxygen Saturation 88% or higher 88% or higher               Exercise Comments:   Exercise Comments     Row Name 09/19/22 1217 11/19/22 1610         Exercise Comments Pt completed first day of group exercise. He exercised 30 min on the recumbent bike at level 1, METs 1.7. tolerated well. Pt performed  warm up and cool down following demonstrative and verbal cues, including squats. Discussed METs with good reception. will progress as tolerated. Come home Ex[facility] with Ithiel. Tecumseh is currently exercising inconsistently at home. He walks 1 non-rehab/day for 5 min/day. I encouraged Tellis to use his living facility's fitness center or walk laps around his living facility. He agreed to walking 1-2 days/wk for 30 min/day. I mentioned that the 30 min could be divided into 2x15 min or 3x10 min. He agreed with my recommendations. I am unsure how motivated Joshu is to exercise at home. Will continue to follow up.               Exercise Goals and Review:   Exercise Goals     Row Name 09/13/22 1011 09/16/22 0906 10/16/22 1147 11/11/22 1126 12/13/22 1120     Exercise Goals   Increase Physical Activity Yes Yes Yes Yes Yes   Intervention Provide advice, education, support and counseling about physical activity/exercise needs.;Develop an individualized exercise prescription for aerobic and resistive training based on initial evaluation findings, risk stratification, comorbidities and participant's personal goals. Provide advice, education, support and counseling about physical activity/exercise needs.;Develop an individualized exercise prescription  for aerobic and resistive training based on initial evaluation findings, risk stratification, comorbidities and participant's personal goals. Provide advice, education, support and counseling about physical activity/exercise needs.;Develop an individualized exercise prescription for aerobic and resistive training based on initial evaluation findings, risk stratification, comorbidities and participant's personal goals. Provide advice, education, support and counseling about physical activity/exercise needs.;Develop an individualized exercise prescription for aerobic and resistive training based on initial evaluation findings, risk stratification, comorbidities and participant's personal goals. Provide advice, education, support and counseling about physical activity/exercise needs.;Develop an individualized exercise prescription for aerobic and resistive training based on initial evaluation findings, risk stratification, comorbidities and participant's personal goals.   Expected Outcomes Long Term: Add in home exercise to make exercise part of routine and to increase amount of physical activity.;Short Term: Attend rehab on a regular basis to increase amount of physical activity.;Long Term: Exercising regularly at least 3-5 days a week. Long Term: Add in home exercise to make exercise part of routine and to increase amount of physical activity.;Short Term: Attend rehab on a regular basis to increase amount of physical activity.;Long Term: Exercising regularly at least 3-5 days a week. Long Term: Add in home exercise to make exercise part of routine and to increase amount of physical activity.;Short Term: Attend rehab on a regular basis to increase amount of physical activity.;Long Term: Exercising regularly at least 3-5 days a week. Long Term: Add in home exercise to make exercise part of routine and to increase amount of physical activity.;Short Term: Attend rehab on a regular basis to increase amount of physical  activity.;Long Term: Exercising regularly at least 3-5 days a week. Long Term: Add in home exercise to make exercise part of routine and to increase amount of physical activity.;Short Term: Attend rehab on a regular basis to increase amount of physical activity.;Long Term: Exercising regularly at least 3-5 days a week.   Increase Strength and Stamina Yes Yes Yes Yes Yes   Intervention Provide advice, education, support and counseling about physical activity/exercise needs.;Develop an individualized exercise prescription for aerobic and resistive training based on initial evaluation findings, risk stratification, comorbidities and participant's personal goals. Provide advice, education, support and counseling about physical activity/exercise needs.;Develop an individualized exercise prescription for aerobic and resistive training based on initial evaluation findings, risk stratification, comorbidities and participant's personal  goals. Provide advice, education, support and counseling about physical activity/exercise needs.;Develop an individualized exercise prescription for aerobic and resistive training based on initial evaluation findings, risk stratification, comorbidities and participant's personal goals. Provide advice, education, support and counseling about physical activity/exercise needs.;Develop an individualized exercise prescription for aerobic and resistive training based on initial evaluation findings, risk stratification, comorbidities and participant's personal goals. Provide advice, education, support and counseling about physical activity/exercise needs.;Develop an individualized exercise prescription for aerobic and resistive training based on initial evaluation findings, risk stratification, comorbidities and participant's personal goals.   Expected Outcomes Short Term: Increase workloads from initial exercise prescription for resistance, speed, and METs.;Short Term: Perform resistance training  exercises routinely during rehab and add in resistance training at home;Long Term: Improve cardiorespiratory fitness, muscular endurance and strength as measured by increased METs and functional capacity ( ) Short Term: Increase workloads from initial exercise prescription for resistance, speed, and METs.;Short Term: Perform resistance training exercises routinely during rehab and add in resistance training at home;Long Term: Improve cardiorespiratory fitness, muscular endurance and strength as measured by increased METs and functional capacity ( ) Short Term: Increase workloads from initial exercise prescription for resistance, speed, and METs.;Short Term: Perform resistance training exercises routinely during rehab and add in resistance training at home;Long Term: Improve cardiorespiratory fitness, muscular endurance and strength as measured by increased METs and functional capacity ( ) Short Term: Increase workloads from initial exercise prescription for resistance, speed, and METs.;Short Term: Perform resistance training exercises routinely during rehab and add in resistance training at home;Long Term: Improve cardiorespiratory fitness, muscular endurance and strength as measured by increased METs and functional capacity ( ) Short Term: Increase workloads from initial exercise prescription for resistance, speed, and METs.;Short Term: Perform resistance training exercises routinely during rehab and add in resistance training at home;Long Term: Improve cardiorespiratory fitness, muscular endurance and strength as measured by increased METs and functional capacity ( )   Able to understand and use rate of perceived exertion (RPE) scale Yes Yes Yes Yes Yes   Intervention Provide education and explanation on how to use RPE scale Provide education and explanation on how to use RPE scale Provide education and explanation on how to use RPE scale Provide education and explanation on how to use RPE scale  Provide education and explanation on how to use RPE scale   Expected Outcomes Short Term: Able to use RPE daily in rehab to express subjective intensity level;Long Term:  Able to use RPE to guide intensity level when exercising independently Short Term: Able to use RPE daily in rehab to express subjective intensity level;Long Term:  Able to use RPE to guide intensity level when exercising independently Short Term: Able to use RPE daily in rehab to express subjective intensity level;Long Term:  Able to use RPE to guide intensity level when exercising independently Short Term: Able to use RPE daily in rehab to express subjective intensity level;Long Term:  Able to use RPE to guide intensity level when exercising independently Short Term: Able to use RPE daily in rehab to express subjective intensity level;Long Term:  Able to use RPE to guide intensity level when exercising independently   Able to understand and use Dyspnea scale Yes Yes Yes Yes Yes   Intervention Provide education and explanation on how to use Dyspnea scale Provide education and explanation on how to use Dyspnea scale Provide education and explanation on how to use Dyspnea scale Provide education and explanation on how to use Dyspnea scale Provide education and explanation on how to use  Dyspnea scale   Expected Outcomes Short Term: Able to use Dyspnea scale daily in rehab to express subjective sense of shortness of breath during exertion;Long Term: Able to use Dyspnea scale to guide intensity level when exercising independently Short Term: Able to use Dyspnea scale daily in rehab to express subjective sense of shortness of breath during exertion;Long Term: Able to use Dyspnea scale to guide intensity level when exercising independently Short Term: Able to use Dyspnea scale daily in rehab to express subjective sense of shortness of breath during exertion;Long Term: Able to use Dyspnea scale to guide intensity level when exercising independently  Short Term: Able to use Dyspnea scale daily in rehab to express subjective sense of shortness of breath during exertion;Long Term: Able to use Dyspnea scale to guide intensity level when exercising independently Short Term: Able to use Dyspnea scale daily in rehab to express subjective sense of shortness of breath during exertion;Long Term: Able to use Dyspnea scale to guide intensity level when exercising independently   Knowledge and understanding of Target Heart Rate Range (THRR) Yes Yes Yes Yes Yes   Intervention Provide education and explanation of THRR including how the numbers were predicted and where they are located for reference Provide education and explanation of THRR including how the numbers were predicted and where they are located for reference Provide education and explanation of THRR including how the numbers were predicted and where they are located for reference Provide education and explanation of THRR including how the numbers were predicted and where they are located for reference Provide education and explanation of THRR including how the numbers were predicted and where they are located for reference   Expected Outcomes Short Term: Able to state/look up THRR;Short Term: Able to use daily as guideline for intensity in rehab;Long Term: Able to use THRR to govern intensity when exercising independently Short Term: Able to state/look up THRR;Short Term: Able to use daily as guideline for intensity in rehab;Long Term: Able to use THRR to govern intensity when exercising independently Short Term: Able to state/look up THRR;Short Term: Able to use daily as guideline for intensity in rehab;Long Term: Able to use THRR to govern intensity when exercising independently Short Term: Able to state/look up THRR;Short Term: Able to use daily as guideline for intensity in rehab;Long Term: Able to use THRR to govern intensity when exercising independently Short Term: Able to state/look up THRR;Short Term:  Able to use daily as guideline for intensity in rehab;Long Term: Able to use THRR to govern intensity when exercising independently   Understanding of Exercise Prescription Yes Yes Yes Yes Yes   Intervention Provide education, explanation, and written materials on patient's individual exercise prescription Provide education, explanation, and written materials on patient's individual exercise prescription Provide education, explanation, and written materials on patient's individual exercise prescription Provide education, explanation, and written materials on patient's individual exercise prescription Provide education, explanation, and written materials on patient's individual exercise prescription   Expected Outcomes Short Term: Able to explain program exercise prescription;Long Term: Able to explain home exercise prescription to exercise independently Short Term: Able to explain program exercise prescription;Long Term: Able to explain home exercise prescription to exercise independently Short Term: Able to explain program exercise prescription;Long Term: Able to explain home exercise prescription to exercise independently Short Term: Able to explain program exercise prescription;Long Term: Able to explain home exercise prescription to exercise independently Short Term: Able to explain program exercise prescription;Long Term: Able to explain home exercise prescription to exercise independently  Exercise Goals Re-Evaluation :  Exercise Goals Re-Evaluation     Row Name 09/16/22 6213 10/16/22 1147 11/11/22 1126 12/13/22 1120       Exercise Goal Re-Evaluation   Exercise Goals Review Increase Physical Activity;Able to understand and use Dyspnea scale;Understanding of Exercise Prescription;Increase Strength and Stamina;Knowledge and understanding of Target Heart Rate Range (THRR);Able to understand and use rate of perceived exertion (RPE) scale Increase Physical Activity;Able to understand and  use Dyspnea scale;Understanding of Exercise Prescription;Increase Strength and Stamina;Knowledge and understanding of Target Heart Rate Range (THRR);Able to understand and use rate of perceived exertion (RPE) scale Increase Physical Activity;Able to understand and use Dyspnea scale;Understanding of Exercise Prescription;Increase Strength and Stamina;Knowledge and understanding of Target Heart Rate Range (THRR);Able to understand and use rate of perceived exertion (RPE) scale Increase Physical Activity;Able to understand and use Dyspnea scale;Understanding of Exercise Prescription;Increase Strength and Stamina;Knowledge and understanding of Target Heart Rate Range (THRR);Able to understand and use rate of perceived exertion (RPE) scale    Comments Pt is scheduled to start exercise this week. Will continue to monitor and progress as able. Ricke has completed 8 exercise sessions. He exercises for 15 min on the track and Nustep. Jasher averages 2.54 METs on the track and 1.9 METs at level 2 on the Nustep. He performs the warmup and cooldown standing without limitations. Mahmud has slightly increased his track laps and METs. Amjad is deconditioning and somewhat difficult to progress. Will continue to monitor and progress able. Conny has completed 12 exercise sessions. He exercises for 15 min on the track and Nustep. Jamorian averages 2.54 METs on the track and 2.2 METs at level 2 on the Nustep. He performs the warmup and cooldown standing without limitations. Rodriquez has increased her workload for the Nustep as METs have slightly increased. He is still deconditioned, which is why he has not had much improvement in his METs. Talin seems motivated to exercise as he enjoys class. Will continue to monitor and progress as able. Devion has completed 23 exercise sessions. He exercises for 15 min on the track and Nustep. Stephano averages 2.7 METs on the track and 2.8 METs at level 2 on the Nustep. He performs the warmup and cooldown  standing without limitations. Jeramyah has increased her workload for the Nustep as METs have slightly increased. Dia is close to graduation as he feels PR has not benefited him much. Tareq has improved functional capacity since starting PR. He is very deconditioned, which is why his improvements have not been tremendously great. Will continue to monitor and progress as able.    Expected Outcomes Through exercise at rehab and home, the patient will decrease shortness of breath with daily activities and feel confident in carrying out an exercise regimen at home. Through exercise at rehab and home, the patient will decrease shortness of breath with daily activities and feel confident in carrying out an exercise regimen at home. Through exercise at rehab and home, the patient will decrease shortness of breath with daily activities and feel confident in carrying out an exercise regimen at home. Through exercise at rehab and home, the patient will decrease shortness of breath with daily activities and feel confident in carrying out an exercise regimen at home.             Discharge Exercise Prescription (Final Exercise Prescription Changes):  Exercise Prescription Changes - 12/17/22 0900       Response to Exercise   Blood Pressure (Admit) 126/70    Blood Pressure (Exercise)  136/74    Blood Pressure (Exit) 114/70    Heart Rate (Admit) 96 bpm    Heart Rate (Exercise) 112 bpm    Heart Rate (Exit) 94 bpm    Oxygen Saturation (Admit) 94 %    Oxygen Saturation (Exercise) 85 %    Oxygen Saturation (Exit) 92 %    Rating of Perceived Exertion (Exercise) 11.5    Perceived Dyspnea (Exercise) 3    Duration Continue with 30 min of aerobic exercise without signs/symptoms of physical distress.    Intensity THRR unchanged      Progression   Progression Continue to progress workloads to maintain intensity without signs/symptoms of physical distress.      Resistance Training   Training Prescription Yes     Weight blue bands    Reps 10-15    Time 10 Minutes      Oxygen   Oxygen Continuous    Liters 2      NuStep   Level 3    Minutes 15    METs 2.5      Oxygen   Maintain Oxygen Saturation 88% or higher             Nutrition:  Target Goals: Understanding of nutrition guidelines, daily intake of sodium 1500mg , cholesterol 200mg , calories 30% from fat and 7% or less from saturated fats, daily to have 5 or more servings of fruits and vegetables.  Biometrics:  Pre Biometrics - 09/13/22 0955       Pre Biometrics   Grip Strength 40 kg              Nutrition Therapy Plan and Nutrition Goals:  Nutrition Therapy & Goals - 12/10/22 1023       Nutrition Therapy   Diet Heart  Healthy Diet      Personal Nutrition Goals   Nutrition Goal Patient to improve diet quality by using the plate method as a guide for meal planning to include lean protein/plant protein, fruits, vegetables, whole grains, nonfat dairy as part of a well-balanced diet.    Comments Goals in progress. Jerric reports stable appetite and standard eating patterns. He lives alone in an independent living community and does his own grocery shopping and cooking. Abed has newly quit smoking. He is down ~8.14# since starting with our program.  His doctor has recommended starting a statin medication. Azael will benefit from participation in pulmonary rehab for nutrition and exercise support.      Intervention Plan   Intervention Prescribe, educate and counsel regarding individualized specific dietary modifications aiming towards targeted core components such as weight, hypertension, lipid management, diabetes, heart failure and other comorbidities.;Nutrition handout(s) given to patient.    Expected Outcomes Short Term Goal: Understand basic principles of dietary content, such as calories, fat, sodium, cholesterol and nutrients.;Long Term Goal: Adherence to prescribed nutrition plan.             Nutrition  Assessments:  MEDIFICTS Score Key: ?70 Need to make dietary changes  40-70 Heart Healthy Diet ? 40 Therapeutic Level Cholesterol Diet   Picture Your Plate Scores: <16 Unhealthy dietary pattern with much room for improvement. 41-50 Dietary pattern unlikely to meet recommendations for good health and room for improvement. 51-60 More healthful dietary pattern, with some room for improvement.  >60 Healthy dietary pattern, although there may be some specific behaviors that could be improved.    Nutrition Goals Re-Evaluation:  Nutrition Goals Re-Evaluation     Row Name 09/19/22 1156 10/17/22 1448 11/14/22  1105 12/10/22 1023       Goals   Current Weight 189 lb 6 oz (85.9 kg) 183 lb 13.8 oz (83.4 kg) 184 lb 4.9 oz (83.6 kg) 179 lb 14.3 oz (81.6 kg)    Comment A1c 6.1, cholesterol 226, triglycerides 185, HDL 39, LDL 150 No new labs; most recent labs A1c 6.1, cholesterol 226, triglycerides 185, HDL 39, LDL 150. He is down 4.2# since starting with our program. No new labs; most recent labs A1c 6.1, cholesterol 226, triglycerides 185, HDL 39, LDL 150. A1c 6.1, cholesterol 233, LDL 178; triglycerides, HDL  improved WNL    Expected Outcome Darry reports stable appetite and standard eating patterns. He lives alone and does his own grocery shopping and cooking. He is currently working on smoking cessation. Malacai will benefit from participation in pulmonary rehab for nutrition and exercise support. Humphrey reports stable appetite and standard eating patterns. He lives alone and does his own grocery shopping and cooking. He is currently working on smoking cessation. Donzell will benefit from participation in pulmonary rehab for nutrition and exercise support. Goals in progress. Brnadon reports stable appetite and standard eating patterns. He lives alone and does his own grocery shopping and cooking. Keveon has newly quit smoking. He is down ~3.7# since starting with our program. Neev will benefit from participation  in pulmonary rehab for nutrition and exercise support. Goals in progress. Aaran reports stable appetite and standard eating patterns. He lives alone in an independent living community and does his own grocery shopping and cooking. Yuriel has newly quit smoking. He is down ~8.14# since starting with our program. His doctor has recommended starting a statin medication. Aithan will benefit from participation in pulmonary rehab for nutrition and exercise support.             Nutrition Goals Discharge (Final Nutrition Goals Re-Evaluation):  Nutrition Goals Re-Evaluation - 12/10/22 1023       Goals   Current Weight 179 lb 14.3 oz (81.6 kg)    Comment A1c 6.1, cholesterol 233, LDL 178; triglycerides, HDL  improved WNL    Expected Outcome Goals in progress. Zerek reports stable appetite and standard eating patterns. He lives alone in an independent living community and does his own grocery shopping and cooking. Mohammed has newly quit smoking. He is down ~8.14# since starting with our program. His doctor has recommended starting a statin medication. Gale will benefit from participation in pulmonary rehab for nutrition and exercise support.             Psychosocial: Target Goals: Acknowledge presence or absence of significant depression and/or stress, maximize coping skills, provide positive support system. Participant is able to verbalize types and ability to use techniques and skills needed for reducing stress and depression.  Initial Review & Psychosocial Screening:  Initial Psych Review & Screening - 09/13/22 1014       Initial Review   Current issues with Current Stress Concerns    Source of Stress Concerns Family;Financial;Unable to participate in former interests or hobbies    Comments Pt is currently concerned about not having a relationship with his 2 children, feels ashamed of smoking, and has little interest in doing things, grumpy about his medical care, [facility], and getting prescriptions  in a timely manner      Family Dynamics   Good Support System? Yes    Comments Friends from living facility      Barriers   Psychosocial barriers to participate in program The patient should  benefit from training in stress management and relaxation.;Psychosocial barriers identified (see note)      Screening Interventions   Interventions Encouraged to exercise;Provide feedback about the scores to participant;To provide support and resources with identified psychosocial needs    Expected Outcomes Long Term Goal: Stressors or current issues are controlled or eliminated.;Short Term goal: Identification and review with participant of any Quality of Life or Depression concerns found by scoring the questionnaire.;Long Term goal: The participant improves quality of Life and PHQ9 Scores as seen by post scores and/or verbalization of changes;Short Term goal: Utilizing psychosocial counselor, staff and physician to assist with identification of specific Stressors or current issues interfering with healing process. Setting desired goal for each stressor or current issue identified.   Pt scored high on PHQ2/9. Pt denies current depression although speaking w/patient there are possible signs. Pt currently declines to speak to a therapist & making an appt for follow up with his PCP. We will continue to assess and monitor his needs.            Quality of Life Scores:  Scores of 19 and below usually indicate a poorer quality of life in these areas.  A difference of  2-3 points is a clinically meaningful difference.  A difference of 2-3 points in the total score of the Quality of Life Index has been associated with significant improvement in overall quality of life, self-image, physical symptoms, and general health in studies assessing change in quality of life.  PHQ-9: Review Flowsheet       09/13/2022 04/24/2018 07/02/2017 03/13/2017  Depression screen PHQ 2/9  Decreased Interest 1 0 1 0 0  Down,  Depressed, Hopeless 1 0 1 0 0  PHQ - 2 Score 2 0 2 0 0  Altered sleeping 1 0 0 - -  Tired, decreased energy 2 3 3  - -  Change in appetite 0 0 0 - -  Feeling bad or failure about yourself  0 2 1 - -  Trouble concentrating 2 1 1  - -  Moving slowly or fidgety/restless 0 0 0 - -  Suicidal thoughts 0 0 0 - -  PHQ-9 Score 7 6 7  - -  Difficult doing work/chores Very difficult Very difficult - -   Interpretation of Total Score  Total Score Depression Severity:  1-4 = Minimal depression, 5-9 = Mild depression, 10-14 = Moderate depression, 15-19 = Moderately severe depression, 20-27 = Severe depression   Psychosocial Evaluation and Intervention:  Psychosocial Evaluation - 09/13/22 1019       Psychosocial Evaluation & Interventions   Interventions Stress management education;Encouraged to exercise with the program and follow exercise prescription    Comments Pt is currently concerned about not having a relationship with his 2 children, feels ashamed of still smoking, has low self worth, little interest in doing things, and  has a grumpy disposition due to his medical care, [facility], and getting prescriptions in a timely manner    Expected Outcomes Pt will report and employ positive and healthy techniques for stress management. Currently declines referral to his PCP and a therapist    Continue Psychosocial Services  Follow up required by staff   We will continue to monitor and assess            Psychosocial Re-Evaluation:  Psychosocial Re-Evaluation     Row Name 09/16/22 0948 10/14/22 1515 11/08/22 1116 12/13/22 0918       Psychosocial Re-Evaluation   Current issues with Current Stress Concerns Current  Stress Concerns Current Stress Concerns Current Stress Concerns    Comments No change since assessment on 09/13/2022. Eidan currently states that his stress level is lower than before. He states that he likes having extra resources available to him through Pulmonary Rehab. Staff was able to get  Presten home oxygen and he states he is "breathing better". Derell states that his ex-wife has been supportive and he's feeling better since his last exacerbation. Zhane has cut back his smoking and looks forward to quitting for good. He states nothing has changed with the relationship with his children nor the finances. He states his rent and prices in general are increasing. Derren declined resources on food and housing. Irven has a lot of stress due to his decline in health and other factors. Since starting the program, he still has financial strain of increased rent. He is trying to quit smoking and is trying to build a relationship back with his kids. He also doesn't see eye to eye with his retirement community. When he attends [facility] he always comes with a complaint or has an uneventful trip. For example, he locked his keys in his car and had to get a ride home from a friend. Then he left his cell phone and wallet in the friend's car. Yesterday he mistakenly had his home O2 on 8L instead of 2L and his tank was empty. He also was upset about the parking at his retirement facility. We have provided and practiced resources with Aarik on stress relief, meditation, tapping, and visualization but to no avail. Deondra declines a referral to a mental health professional at this time. Chi has done a complete turn around in the program. We have assisted Viola in speaking to his pulmonologist and his decline and have gotten him started on new medication. He is finally feeling and breathing a lot better. He is making great progress in the class and has a new positive attitude. He is still upset about his rent increasing but he is making ends meet. He is also enjoying the camaraderie and potlucks at his living facility. He denies any needs currently.    Expected Outcomes For Johnatan to attend PR without any psychosocial barriers or concerns. For Elyon to attend PR without any psychosocial barriers or concerns and to use  positive self-care techniques to manage his stress. For Deveon to attend PR without any psychosocial barriers or concerns and to use positive self-care techniques to manage his stress. For Kortney to attend PR without any psychosocial barriers or concerns and to use positive self-care techniques to manage his stress.    Interventions Encouraged to attend Pulmonary Rehabilitation for the exercise;Stress management education Encouraged to attend Pulmonary Rehabilitation for the exercise;Stress management education Encouraged to attend Pulmonary Rehabilitation for the exercise;Stress management education Encouraged to attend Pulmonary Rehabilitation for the exercise;Stress management education    Continue Psychosocial Services  Follow up required by staff Follow up required by staff Follow up required by staff  We will continue to monitor and assess Saharsh Follow up required by staff  We will continue to monitor and assess Pieter             Psychosocial Discharge (Final Psychosocial Re-Evaluation):  Psychosocial Re-Evaluation - 12/13/22 0918       Psychosocial Re-Evaluation   Current issues with Current Stress Concerns    Comments Llewyn has done a complete turn around in the program. We have assisted Subhan in speaking to his pulmonologist and his decline and have  gotten him started on new medication. He is finally feeling and breathing a lot better. He is making great progress in the class and has a new positive attitude. He is still upset about his rent increasing but he is making ends meet. He is also enjoying the camaraderie and potlucks at his living facility. He denies any needs currently.    Expected Outcomes For Izaiha to attend PR without any psychosocial barriers or concerns and to use positive self-care techniques to manage his stress.    Interventions Encouraged to attend Pulmonary Rehabilitation for the exercise;Stress management education    Continue Psychosocial Services  Follow up required  by staff   We will continue to monitor and assess Kaycee            Education: Education Goals: Education classes will be provided on a weekly basis, covering required topics. Participant will state understanding/return demonstration of topics presented.  Learning Barriers/Preferences:  Learning Barriers/Preferences - 09/13/22 1153       Learning Barriers/Preferences   Learning Barriers Sight;Hearing    Learning Preferences Group Instruction;Individual Instruction;Verbal Instruction;Written Material;Skilled Demonstration             Education Topics: Introduction to Pulmonary Rehab Group instruction provided by PowerPoint, verbal discussion, and written material to support subject matter. Instructor reviews what Pulmonary Rehab is, the purpose of the program, and how patients are referred.     Know Your Numbers Group instruction that is supported by a PowerPoint presentation. Instructor discusses importance of knowing and understanding resting, exercise, and post-exercise oxygen saturation, heart rate, and blood pressure. Oxygen saturation, heart rate, blood pressure, rating of perceived exertion, and dyspnea are reviewed along with a normal range for these values.  Flowsheet Row PULMONARY REHAB OTHER RESPIRATORY from 09/19/2022 in [facility] for Heart, Vascular, & Lung Health  Date 09/19/22  Educator EP  Instruction Review Code 1- Verbalizes Understanding       Exercise for the Pulmonary Patient Group instruction that is supported by a PowerPoint presentation. Instructor discusses benefits of exercise, core components of exercise, frequency, duration, and intensity of an exercise routine, importance of utilizing pulse oximetry during exercise, safety while exercising, and options of places to exercise outside of rehab.  Flowsheet Row PULMONARY REHAB OTHER RESPIRATORY from 12/05/2022 in [facility] for Heart, Vascular, & Lung  Health  Date 12/05/22  Educator EP  Instruction Review Code 1- Verbalizes Understanding          MET Level  Group instruction provided by PowerPoint, verbal discussion, and written material to support subject matter. Instructor reviews what METs are and how to increase METs.  Flowsheet Row PULMONARY REHAB OTHER RESPIRATORY from 11/14/2022 in [facility] for Heart, Vascular, & Lung Health  Date 11/14/22  Educator EP  Instruction Review Code 1- Verbalizes Understanding       Pulmonary Medications Verbally interactive group education provided by instructor with focus on inhaled medications and proper administration. Flowsheet Row PULMONARY REHAB CHRONIC OBSTRUCTIVE PULMONARY DISEASE from 08/06/2018 in [facility] for Heart, Vascular, & Lung Health  Date 06/30/18  Educator Victorino Dike  Instruction Review Code 1- Verbalizes Understanding       Anatomy and Physiology of the Respiratory System Group instruction provided by PowerPoint, verbal discussion, and written material to support subject matter. Instructor reviews respiratory cycle and anatomical components of the respiratory system and their functions. Instructor also reviews differences in obstructive and restrictive respiratory diseases with examples  of each.  Flowsheet Row PULMONARY REHAB OTHER RESPIRATORY from 11/21/2022 in [facility] for Heart, Vascular, & Lung Health  Date 11/21/22  Educator RT  Instruction Review Code 1- Verbalizes Understanding       Oxygen Safety Group instruction provided by PowerPoint, verbal discussion, and written material to support subject matter. There is an overview of "What is Oxygen" and "Why do we need it".  Instructor also reviews how to create a safe environment for oxygen use, the importance of using oxygen as prescribed, and the risks of noncompliance. There is a brief discussion on traveling with oxygen and resources  the patient may utilize. Flowsheet Row PULMONARY REHAB OTHER RESPIRATORY from 09/26/2022 in [facility] for Heart, Vascular, & Lung Health  Date 09/26/22  Educator EP  Instruction Review Code 1- Verbalizes Understanding       Oxygen Use Group instruction provided by PowerPoint, verbal discussion, and written material to discuss how supplemental oxygen is prescribed and different types of oxygen supply systems. Resources for more information are provided.  Flowsheet Row PULMONARY REHAB OTHER RESPIRATORY from 10/03/2022 in [facility] for Heart, Vascular, & Lung Health  Date 10/03/22  Educator RT  Instruction Review Code 1- Verbalizes Understanding       Breathing Techniques Group instruction that is supported by demonstration and informational handouts. Instructor discusses the benefits of pursed lip and diaphragmatic breathing and detailed demonstration on how to perform both.  Flowsheet Row PULMONARY REHAB OTHER RESPIRATORY from 10/17/2022 in [facility] for Heart, Vascular, & Lung Health  Date 10/17/22  Educator RT  Instruction Review Code 1- Verbalizes Understanding        Risk Factor Reduction Group instruction that is supported by a PowerPoint presentation. Instructor discusses the definition of a risk factor, different risk factors for pulmonary disease, and how the heart and lungs work together. Flowsheet Row PULMONARY REHAB OTHER RESPIRATORY from 10/31/2022 in [facility] for Heart, Vascular, & Lung Health  Date 10/31/22  Educator EP  Instruction Review Code 1- Verbalizes Understanding       MD Day A group question and answer session with a medical doctor that allows participants to ask questions that relate to their pulmonary disease state.   Nutrition for the Pulmonary Patient Group instruction provided by PowerPoint slides, verbal discussion, and written materials to  support subject matter. The instructor gives an explanation and review of healthy diet recommendations, which includes a discussion on weight management, recommendations for fruit and vegetable consumption, as well as protein, fluid, caffeine, fiber, sodium, sugar, and alcohol. Tips for eating when patients are short of breath are discussed. Flowsheet Row PULMONARY REHAB CHRONIC OBSTRUCTIVE PULMONARY DISEASE from 08/06/2018 in [facility] for Heart, Vascular, & Lung Health  Date 06/18/18  Educator Danne [place]  Instruction Review Code 1- Verbalizes Understanding        Other Education Group or individual verbal, written, or video instructions that support the educational goals of the pulmonary rehab program. Flowsheet Row PULMONARY REHAB OTHER RESPIRATORY from 11/07/2022 in [facility] for Heart, Vascular, & Lung Health  Date 11/07/22  Educator RN  Instruction Review Code 1- Verbalizes Understanding        Knowledge Questionnaire Score:  Knowledge Questionnaire Score - 09/13/22 1153       Knowledge Questionnaire Score   Pre Score 12/18  Core Components/Risk Factors/Patient Goals at Admission:  Personal Goals and Risk Factors at Admission - 09/13/22 1019       Core Components/Risk Factors/Patient Goals on Admission    Weight Management Yes;Weight Loss    Admit Weight 188 lb (85.3 kg)    Goal Weight: Short Term 175 lb (79.4 kg)    Expected Outcomes Short Term: Continue to assess and modify interventions until short term weight is achieved;Long Term: Adherence to nutrition and physical activity/exercise program aimed toward attainment of established weight goal;Weight Maintenance: Understanding of the daily nutrition guidelines, which includes 25-35% calories from fat, 7% or less cal from saturated fats, less than 200mg  cholesterol, less than 1.5gm of sodium, & 5 or more servings of fruits and vegetables daily;Weight Loss:  Understanding of general recommendations for a balanced deficit meal plan, which promotes 1-2 lb weight loss per week and includes a negative energy balance of [phone] kcal/d;Understanding recommendations for meals to include 15-35% energy as protein, 25-35% energy from fat, 35-60% energy from carbohydrates, less than 200mg  of dietary cholesterol, 20-35 gm of total fiber daily;Understanding of distribution of calorie intake throughout the day with the consumption of 4-5 meals/snacks    Tobacco Cessation Yes    Number of packs per day 1-3 cigs/day    Intervention Assist the participant in steps to quit. Provide individualized education and counseling about committing to Tobacco Cessation, relapse prevention, and pharmacological support that can be provided by physician.;Education officer, environmental, assist with locating and accessing local/national Quit Smoking programs, and support quit date choice.    Expected Outcomes Short Term: Will demonstrate readiness to quit, by selecting a quit date.;Long Term: Complete abstinence from all tobacco products for at least 12 months from quit date.;Short Term: Will quit all tobacco product use, adhering to prevention of relapse plan.    Improve shortness of breath with ADL's Yes    Intervention Provide education, individualized exercise plan and daily activity instruction to help decrease symptoms of SOB with activities of daily living.    Expected Outcomes Short Term: Improve cardiorespiratory fitness to achieve a reduction of symptoms when performing ADLs;Long Term: Be able to perform more ADLs without symptoms or delay the onset of symptoms    Increase knowledge of respiratory medications and ability to use respiratory devices properly  Yes    Intervention Provide education and demonstration as needed of appropriate use of medications, inhalers, and oxygen therapy.    Expected Outcomes Short Term: Achieves understanding of medications use. Understands that oxygen  is a medication prescribed by physician. Demonstrates appropriate use of inhaler and oxygen therapy.;Long Term: Maintain appropriate use of medications, inhalers, and oxygen therapy.    Stress Yes    Intervention Offer individual and/or small group education and counseling on adjustment to heart disease, stress management and health-related lifestyle change. Teach and support self-help strategies.;Refer participants experiencing significant psychosocial distress to appropriate mental health specialists for further evaluation and treatment. When possible, include family members and significant others in education/counseling sessions.    Expected Outcomes Short Term: Participant demonstrates changes in health-related behavior, relaxation and other stress management skills, ability to obtain effective social support, and compliance with psychotropic medications if prescribed.;Long Term: Emotional wellbeing is indicated by absence of clinically significant psychosocial distress or social isolation.             Core Components/Risk Factors/Patient Goals Review:   Goals and Risk Factor Review     Row Name 09/16/22 [phone] 10/14/22 1520 11/08/22 1125 12/13/22 9604  Core Components/Risk Factors/Patient Goals Review   Personal Goals Review Weight Management/Obesity;Tobacco Cessation;Improve shortness of breath with ADL's;Develop more efficient breathing techniques such as purse lipped breathing and diaphragmatic breathing and practicing self-pacing with activity.;Increase knowledge of respiratory medications and ability to use respiratory devices properly.;Stress Weight Management/Obesity;Tobacco Cessation;Improve shortness of breath with ADL's;Develop more efficient breathing techniques such as purse lipped breathing and diaphragmatic breathing and practicing self-pacing with activity.;Increase knowledge of respiratory medications and ability to use respiratory devices properly.;Stress Weight  Management/Obesity;Improve shortness of breath with ADL's;Stress;Tobacco Cessation Weight Management/Obesity;Improve shortness of breath with ADL's;Stress;Tobacco Cessation    Review No changes since orientation visit on 09/13/2022. Ercell is scheduled to start his first session on 09/19/2022. Yony has attended 3 weeks of Pulmonary Rehab. His weight has been stable. He is working with our dietician to lose weight. (See dietitian note). Heyden is also working toward smoking cessation. He has cut back on how many cigarettes he smokes daily. He has met his goal on correctly stating when to use his inhaler and has properly demonstrated it with our respiratory therapist. He has also met his goal of developing more efficient breathing techniques and self-pacing. Connie can initiate pursed lip breathing when he is short of breath without staff involvement. He is still working on improving his shortness of breath but feels like PR is helping. Edan is progressing on his goal of weight loss. He is down since starting the program. Goal in progress (see dietician note). He is working on improving his shortness of breath with ADLs to get back to where he previously was. Goal in progress. His oxygen saturation has been stable on 3L while exercising. He has met 2 goals of increasing his knowledge of respiratory medications and the ability to use respiratory devices properly as well as developing more efficient breathing techniques such as purse lipped and diaphragmatic breathing and practicing self-pacing with activity. Laderius states he is cutting back on smoking, goal in progress. He is also trying to decrease his stress levels, goal in progress. Kao states the program isn't helping and is making his breathing worse. Goal progressing for weight loss. Jamieon has been working with our dietician for weight loss, his weight is down since starting the program. Goal progressing on improving his shortness of breath with ADLs. Goal met for  smoking cessation. Levis has quit smoking. Goal progressing for decreasing stress. Kaesyn's health is improving and decreasing some of his health-related stress. He is still stressed about his finances and the parking situation at his retirement facility.    Expected Outcomes Unable to evaluate any progress towards his goals yet. Duance will start the program on 09/19/2022. See admission goals See admission goals See admission goals             Core Components/Risk Factors/Patient Goals at Discharge (Final Review):   Goals and Risk Factor Review - 12/13/22 0928       Core Components/Risk Factors/Patient Goals Review   Personal Goals Review Weight Management/Obesity;Improve shortness of breath with ADL's;Stress;Tobacco Cessation    Review Goal progressing for weight loss. Fain has been working with our dietician for weight loss, his weight is down since starting the program. Goal progressing on improving his shortness of breath with ADLs. Goal met for smoking cessation. Zakai has quit smoking. Goal progressing for decreasing stress. Jaxn's health is improving and decreasing some of his health-related stress. He is still stressed about his finances and the parking situation at his retirement facility.    Expected Outcomes See  admission goals             ITP Comments: Pt is making expected progress toward Pulmonary Rehab goals after completing 24 sessions. Recommend continued exercise, life style modification, education, and utilization of breathing techniques to increase stamina and strength, while also decreasing shortness of breath with exertion.  Dr. Mechele Collin is Medical Director for Pulmonary Rehab at [facility].

## 2022-12-19 ENCOUNTER — Encounter (HOSPITAL_COMMUNITY)
Admission: RE | Admit: 2022-12-19 | Discharge: 2022-12-19 | Disposition: A | Discharge status: Home / Self Care | Payer: No Typology Code available for payment source | Source: Ambulatory Visit | Attending: Internal Medicine | Admitting: Internal Medicine

## 2022-12-19 DIAGNOSIS — G4733 Obstructive sleep apnea (adult) (pediatric): Secondary | ICD-10-CM | POA: Diagnosis not present

## 2022-12-19 NOTE — Progress Notes (Signed)
Daily Session Note  Patient Details  Name: Steve Riley MRN: [id number] Date of Birth: [birth year] Referring Provider:   [name] Pulmonary Rehab Walk Test from 09/13/2022 in [facility] for Heart, Vascular, & Lung Health  Referring Provider [name] coverage)       Encounter Date: 12/19/2022  Check In:  Session Check In - 12/19/22 0844       Check-In   Supervising physician immediately available to respond to emergencies [facility] MD immediately available    Physician(s) Micah Flesher, NP    Location [facility]    Staff Present Essie Hart, RN, BSN;Randi Idelle Crouch BS, ACSM-CEP, Exercise Physiologist;Kaylee Earlene Plater, MS, ACSM-CEP, Exercise Physiologist;Jerusalem Wert Katrinka Blazing, RT    Virtual Visit No    Medication changes reported     No    Fall or balance concerns reported    No    Tobacco Cessation No Change    Warm-up and Cool-down Performed as group-led instruction    Resistance Training Performed Yes    VAD Patient? No    PAD/SET Patient? No      Pain Assessment   Currently in Pain? No/denies    Multiple Pain Sites No             Capillary Blood Glucose: No results found for this or any previous visit (from the past 24 hour(s)).    Social History   Tobacco Use  Smoking Status Former   Current packs/day: 0.00   Average packs/day: 0.3 packs/day for 54.0 years (13.5 ttl pk-yrs)   Types: Cigarettes   Start date: 07/11/1962   Quit date: 07/11/2016   Years since quitting: 6.4  Smokeless Tobacco Never  Tobacco Comments   Pt stopped smoking on Memorial Day weekend 2024. ARJ 11/29/22    Goals Met:  Proper associated with RPD/PD & O2 Sat Independence with exercise equipment Exercise tolerated well No report of concerns or symptoms today Strength training completed today  Goals Unmet:  Not Applicable  Comments: Service time is from 0822 to 0943.    Dr. Mechele Collin is Medical Director for Pulmonary Rehab at [facility].

## 2022-12-24 ENCOUNTER — Encounter (HOSPITAL_COMMUNITY)
Admission: RE | Admit: 2022-12-24 | Discharge: 2022-12-24 | Disposition: A | Discharge status: Home / Self Care | Payer: No Typology Code available for payment source | Source: Ambulatory Visit | Attending: Internal Medicine | Admitting: Internal Medicine

## 2022-12-24 DIAGNOSIS — G4733 Obstructive sleep apnea (adult) (pediatric): Secondary | ICD-10-CM | POA: Diagnosis not present

## 2022-12-24 NOTE — Progress Notes (Signed)
Daily Session Note  Patient Details  Name: Steve Riley MRN: [id number] Date of Birth: 12-14-1950 Referring Provider:   [name] Pulmonary Rehab Walk Test from 09/13/2022 in [facility] for Heart, Vascular, & Lung Health  Referring Provider [name] coverage)       Encounter Date: 12/24/2022  Check In:  Session Check In - 12/24/22 0825       Check-In   Supervising physician immediately available to respond to emergencies [facility] MD immediately available    Physician(s) Edd Fabian NP    Location [facility]    Staff Present Essie Hart, RN, BSN;Randi Idelle Crouch BS, ACSM-CEP, Exercise Physiologist;Kaylee Earlene Plater, MS, ACSM-CEP, Exercise Physiologist;Namari Breton Katrinka Blazing, RT    Virtual Visit No    Medication changes reported     No    Fall or balance concerns reported    No    Tobacco Cessation No Change    Warm-up and Cool-down Performed as group-led instruction    Resistance Training Performed Yes    VAD Patient? No    PAD/SET Patient? No      Pain Assessment   Currently in Pain? No/denies             Capillary Blood Glucose: No results found for this or any previous visit (from the past 24 hour(s)).    Social History   Tobacco Use  Smoking Status Former   Current packs/day: 0.00   Average packs/day: 0.3 packs/day for 54.0 years (13.5 ttl pk-yrs)   Types: Cigarettes   Start date: 07/11/1962   Quit date: 07/11/2016   Years since quitting: 6.4  Smokeless Tobacco Never  Tobacco Comments   Pt stopped smoking on Memorial Day weekend 2024. ARJ 11/29/22    Goals Met:  Proper associated with RPD/PD & O2 Sat Independence with exercise equipment Exercise tolerated well No report of concerns or symptoms today Strength training completed today  Goals Unmet:  Not Applicable  Comments: Service time is from 0811 to 0935.    Dr. Mechele Collin is Medical Director for Pulmonary Rehab at [facility].

## 2022-12-26 ENCOUNTER — Encounter (HOSPITAL_COMMUNITY)
Admission: RE | Admit: 2022-12-26 | Discharge: 2022-12-26 | Disposition: A | Discharge status: Home / Self Care | Payer: No Typology Code available for payment source | Source: Ambulatory Visit | Attending: Internal Medicine | Admitting: Internal Medicine

## 2022-12-26 VITALS — Wt 175.9 lb

## 2022-12-26 DIAGNOSIS — G4733 Obstructive sleep apnea (adult) (pediatric): Secondary | ICD-10-CM | POA: Diagnosis not present

## 2022-12-26 NOTE — Progress Notes (Signed)
Daily Session Note  Patient Details  Name: Steve Riley MRN: [id number] Date of Birth: 1950/12/24 Referring Provider:   [name] Pulmonary Rehab Walk Test from 09/13/2022 in [facility]  Referring Provider [name] coverage)       Encounter Date: 12/26/2022  Check In:  Session Check In - 12/26/22 4098       Check-In   Supervising physician immediately available to respond to emergencies [facility] MD immediately available    Physician(s) Bernadene Person, NP    Location [facility]    Staff Present Samantha Belarus, RD, Dutch Gray, RN, BSN;Randi Reeve BS, ACSM-CEP, Exercise Physiologist;Kaylee Earlene Plater, MS, ACSM-CEP, Exercise Physiologist;Ivania Teagarden Katrinka Blazing, RT    Virtual Visit No    Medication changes reported     No    Fall or balance concerns reported    No    Tobacco Cessation No Change    Warm-up and Cool-down Performed as group-led instruction    Resistance Training Performed Yes    VAD Patient? No    PAD/SET Patient? No      Pain Assessment   Currently in Pain? No/denies    Multiple Pain Sites No             Capillary Blood Glucose: No results found for this or any previous visit (from the past 24 hour(s)).    Social History   Tobacco Use  Smoking Status Former   Current packs/day: 0.00   Average packs/day: 0.3 packs/day for 54.0 years (13.5 ttl pk-yrs)   Types: Cigarettes   Start date: 07/11/1962   Quit date: 07/11/2016   Years since quitting: 6.4  Smokeless Tobacco Never  Tobacco Comments   Pt stopped smoking on Memorial Day weekend 2024. ARJ 11/29/22    Goals Met:  Proper associated with RPD/PD & O2 Sat Independence with exercise equipment Exercise tolerated well No report of concerns or symptoms today Strength training completed today  Goals Unmet:  Not Applicable  Comments: Service time is from 0817 to 0940.    Dr. Mechele Collin is Medical Director for Pulmonary Rehab at  [facility].

## 2022-12-31 ENCOUNTER — Encounter (HOSPITAL_COMMUNITY): Payer: No Typology Code available for payment source

## 2023-01-02 ENCOUNTER — Encounter (HOSPITAL_COMMUNITY)
Admission: RE | Admit: 2023-01-02 | Discharge: 2023-01-02 | Disposition: A | Discharge status: Home / Self Care | Payer: No Typology Code available for payment source | Source: Ambulatory Visit | Attending: Internal Medicine | Admitting: Internal Medicine

## 2023-01-02 DIAGNOSIS — G4733 Obstructive sleep apnea (adult) (pediatric): Secondary | ICD-10-CM

## 2023-01-02 NOTE — Progress Notes (Signed)
Daily Session Note  Patient Details  Name: Steve Riley MRN: [id number] Date of Birth: 1951/03/19 Referring Provider:   [name] Pulmonary Rehab Walk Test from 09/13/2022 in [facility]  [facility]erring [name] coverage)       Encounter Date: 01/02/2023  Check In:  Session Check In - 01/02/23 0831       Check-In   Supervising physician immediately available to respond to emergencies [facility] MD immediately available    Physician(s) Eligha Bridegroom, NP    Location [facility]    Staff Present Essie Hart, RN, Doris Cheadle, MS, ACSM-CEP, Exercise Physiologist;Casey Katrinka Blazing, RT    Virtual Visit No    Medication changes reported     No    Fall or balance concerns reported    No    Tobacco Cessation No Change    Warm-up and Cool-down Performed as group-led instruction    Resistance Training Performed Yes    VAD Patient? No      Pain Assessment   Currently in Pain? No/denies    Multiple Pain Sites No             Capillary Blood Glucose: No results found for this or any previous visit (from the past 24 hour(s)).    Social History   Tobacco Use  Smoking Status Former   Current packs/day: 0.00   Average packs/day: 0.3 packs/day for 54.0 years (13.5 ttl pk-yrs)   Types: Cigarettes   Start date: 07/11/1962   Quit date: 07/11/2016   Years since quitting: 6.4  Smokeless Tobacco Never  Tobacco Comments   Pt stopped smoking on Memorial Day weekend 2024. ARJ 11/29/22    Goals Met:  Independence with exercise equipment Exercise tolerated well No report of concerns or symptoms today Strength training completed today  Goals Unmet:  Not Applicable  Comments: Service time is from 0810 to 0932    Dr. Mechele Collin is Medical Director for Pulmonary Rehab at [facility].

## 2023-01-07 ENCOUNTER — Encounter (HOSPITAL_COMMUNITY)
Admission: RE | Admit: 2023-01-07 | Discharge: 2023-01-07 | Disposition: A | Discharge status: Home / Self Care | Payer: No Typology Code available for payment source | Source: Ambulatory Visit | Attending: Internal Medicine | Admitting: Internal Medicine

## 2023-01-07 DIAGNOSIS — G4733 Obstructive sleep apnea (adult) (pediatric): Secondary | ICD-10-CM | POA: Diagnosis not present

## 2023-01-07 NOTE — Progress Notes (Signed)
Daily Session Note  Patient Details  Name: ELVIA BIEHLE MRN: [id number] Date of Birth: December 04, 1950 Referring Provider:   [name] Pulmonary Rehab Walk Test from 09/13/2022 in [facility]  [facility]erring [name] coverage)       Encounter Date: 01/07/2023  Check In:  Session Check In - 01/07/23 0850       Check-In   Supervising physician immediately available to respond to emergencies [facility] MD immediately available    Physician(s) Robin Searing, NP    Location [facility]    Staff Present Essie Hart, RN, BSN;Randi Idelle Crouch BS, ACSM-CEP, Exercise Physiologist;Naman Spychalski Katrinka Blazing, RT    Virtual Visit No    Medication changes reported     No    Fall or balance concerns reported    No    Tobacco Cessation No Change    Warm-up and Cool-down Performed as group-led instruction    Resistance Training Performed Yes    VAD Patient? No    PAD/SET Patient? No      Pain Assessment   Currently in Pain? No/denies    Pain Score 0-No pain    Multiple Pain Sites No             Capillary Blood Glucose: No results found for this or any previous visit (from the past 24 hour(s)).    Social History   Tobacco Use  Smoking Status Former   Current packs/day: 0.00   Average packs/day: 0.3 packs/day for 54.0 years (13.5 ttl pk-yrs)   Types: Cigarettes   Start date: 07/11/1962   Quit date: 07/11/2016   Years since quitting: 6.4  Smokeless Tobacco Never  Tobacco Comments   Pt stopped smoking on Memorial Day weekend 2024. ARJ 11/29/22    Goals Met:  Proper associated with RPD/PD & O2 Sat Independence with exercise equipment Exercise tolerated well No report of concerns or symptoms today Strength training completed today  Goals Unmet:  Not Applicable  Comments: Service time is from 0804 to 0930.    Dr. Mechele Collin is Medical Director for Pulmonary Rehab at [facility].

## 2023-01-09 ENCOUNTER — Encounter (HOSPITAL_COMMUNITY)
Admission: RE | Admit: 2023-01-09 | Discharge: 2023-01-09 | Disposition: A | Discharge status: Home / Self Care | Payer: No Typology Code available for payment source | Source: Ambulatory Visit | Attending: Internal Medicine | Admitting: Internal Medicine

## 2023-01-09 VITALS — Wt 173.3 lb

## 2023-01-09 DIAGNOSIS — G4733 Obstructive sleep apnea (adult) (pediatric): Secondary | ICD-10-CM | POA: Diagnosis present

## 2023-01-09 NOTE — Progress Notes (Signed)
Daily Session Note  Patient Details  Name: Steve Riley MRN: [id number] Date of Birth: 1950/10/17 Referring Provider:   [name] Pulmonary Rehab Walk Test from 09/13/2022 in [facility]  [name] coverage)       Encounter Date: 01/09/2023  Check In:  Session Check In - 01/09/23 0830       Check-In   Supervising physician immediately available to respond to emergencies [facility] MD immediately available    Physician(s) Carlyon Shadow, NP    Location [facility]    Staff Present Essie Hart, RN, BSN;Randi Idelle Crouch BS, ACSM-CEP, Exercise Physiologist;Deyvi Bonanno Hermine Messick Belarus, RD, LDN    Virtual Visit No    Medication changes reported     No    Fall or balance concerns reported    No    Tobacco Cessation No Change    Warm-up and Cool-down Performed as group-led instruction    Resistance Training Performed Yes    VAD Patient? No    PAD/SET Patient? No      Pain Assessment   Currently in Pain? No/denies             Capillary Blood Glucose: No results found for this or any previous visit (from the past 24 hour(s)).    Social History   Tobacco Use  Smoking Status Former   Current packs/day: 0.00   Average packs/day: 0.3 packs/day for 54.0 years (13.5 ttl pk-yrs)   Types: Cigarettes   Start date: 07/11/1962   Quit date: 07/11/2016   Years since quitting: 6.5  Smokeless Tobacco Never  Tobacco Comments   Pt stopped smoking on Memorial Day weekend 2024. ARJ 11/29/22    Goals Met:  Proper associated with RPD/PD & O2 Sat Independence with exercise equipment Exercise tolerated well No report of concerns or symptoms today Strength training completed today  Goals Unmet:  Not Applicable  Comments: Service time is from 0811 to 0939.    Dr. Mechele Collin is Medical Director for Pulmonary Rehab at [facility].

## 2023-01-14 ENCOUNTER — Encounter (HOSPITAL_COMMUNITY): Payer: No Typology Code available for payment source

## 2023-01-15 NOTE — Progress Notes (Signed)
Pulmonary Individual Treatment Plan  Patient Details  Name: Steve Riley MRN: [id number] Date of Birth: 04/28/1951 Referring Provider:   [name] Pulmonary Rehab Walk Test from 09/13/2022 in [facility]  [facility]erring [name] coverage)       Initial Encounter Date:  Flowsheet Row Pulmonary Rehab Walk Test from 09/13/2022 in [facility]  Date 09/13/22       Visit Diagnosis: OSA (obstructive sleep apnea)  Patient's Home Medications on Admission:   Current Outpatient Medications:    albuterol (PROVENTIL) (2.5 MG/3ML) 0.083% nebulizer solution, Take 3 mLs (2.5 mg total) by nebulization every 6 (six) hours as needed for wheezing or shortness of breath., Disp: 75 mL, Rfl: 5   albuterol (PROVENTIL) (2.5 MG/3ML) 0.083% nebulizer solution, Take 3 mLs (2.5 mg total) by nebulization every 6 (six) hours as needed for wheezing or shortness of breath. (Patient not taking: Reported on 11/29/2022), Disp: 120 mL, Rfl: 11   albuterol (VENTOLIN HFA) 108 (90 Base) MCG/ACT inhaler, Inhale 1-2 puffs into the lungs every 6 (six) hours as needed for wheezing., Disp: 18 g, Rfl: 4   APPLE CIDER VINEGAR PO, Take by mouth in the morning and at bedtime. , Disp: , Rfl:    Ascorbic Acid (VITAMIN C WITH ROSE HIPS) 1000 MG tablet, Take 1,000 mg by mouth daily., Disp: , Rfl:    aspirin 81 MG chewable tablet, Chew 1 tablet (81 mg total) by mouth 2 (two) times daily. (Patient not taking: Reported on 02/14/2022), Disp: 60 tablet, Rfl: 0   benzonatate (TESSALON PERLES) 100 MG capsule, Take 1 capsule (100 mg total) by mouth 2 (two) times daily as needed for cough. (Patient not taking: Reported on 11/29/2022), Disp: 60 capsule, Rfl: 2   budesonide-formoterol (SYMBICORT) 160-4.5 MCG/ACT inhaler, Inhale 2 puffs into the lungs 2 (two) times daily., Disp: 30.6 each, Rfl: 5   Calcium Carbonate-Vitamin D  600-400 MG-UNIT chew tablet, Chew 1 tablet by mouth 2 (two) times daily. , Disp: , Rfl:    Cholecalciferol (DIALYVITE VITAMIN D 5000) 125 MCG (5000 UT) capsule, Take 5,000 Units by mouth daily., Disp: , Rfl:    Cyanocobalamin (B-12) 5000 MCG CAPS, Take 5,000 mcg by mouth daily., Disp: , Rfl:    Flaxseed, Linseed, (FLAX PO), Take 2,600 mg by mouth daily., Disp: , Rfl:    folic acid (FOLVITE) 1 MG tablet, Take 1 tablet by mouth daily., Disp: , Rfl:    methocarbamol (ROBAXIN) 500 MG tablet, Take 1 tablet (500 mg total) by mouth every 6 (six) hours as needed for muscle spasms. (Patient not taking: Reported on 02/14/2022), Disp: 60 tablet, Rfl: 0   Methylsulfonylmethane (MSM) 1000 MG CAPS, Take 1,000 mg by mouth 2 (two) times daily., Disp: , Rfl:    ondansetron (ZOFRAN) 4 MG tablet, Take 1 tablet (4 mg total) by mouth every 8 (eight) hours as needed for up to 20 doses for nausea or vomiting. (Patient not taking: Reported on 02/14/2022), Disp: 20 tablet, Rfl: 0   OVER THE COUNTER MEDICATION, Take 4,000 Units by mouth in the morning and at bedtime. Serrapeptase, Disp: , Rfl:    oxyCODONE (OXY IR/ROXICODONE) 5 MG immediate release tablet, Take 1-2 tablets (5-10 mg total) by mouth every 4 (four) hours as needed for moderate pain (pain score 4-6). (Patient not taking: Reported on 02/14/2022), Disp: 30 tablet, Rfl: 0   predniSONE (DELTASONE) 20 MG tablet,  Take 2 tablets (40 mg total) by mouth daily with breakfast., Disp: 10 tablet, Rfl: 0   Probiotic CAPS, Take 1 capsule by mouth 2 (two) times daily with a meal., Disp: , Rfl:    selenium 200 MCG TABS tablet, Take 200 mcg by mouth daily., Disp: , Rfl:    silver sulfADIAZINE (SILVADENE) 1 % cream, Apply 1 application  topically in the morning, at noon, and at bedtime., Disp: , Rfl:    Tiotropium Bromide Monohydrate (SPIRIVA RESPIMAT) 2.5 MCG/ACT AERS, Inhale 1 puff into the lungs in the morning and at bedtime., Disp: 12 g, Rfl: 2   traZODone (DESYREL) 100 MG tablet,  Take 100 mg by mouth at bedtime. (Patient not taking: Reported on 02/14/2022), Disp: , Rfl:    Turmeric Curcumin 500 MG CAPS, Take 500 mg by mouth in the morning and at bedtime.  (Patient not taking: Reported on 09/05/2022), Disp: , Rfl:    Zinc 50 MG TABS, Take 50 mg by mouth daily., Disp: , Rfl:   Past Medical History: Past Medical History:  Diagnosis Date   COPD (chronic obstructive pulmonary disease) (HCC)    Pneumonia    Prostate cancer (HCC)     Tobacco Use: Social History   Tobacco Use  Smoking Status Former   Current packs/day: 0.00   Average packs/day: 0.3 packs/day for 54.0 years (13.5 ttl pk-yrs)   Types: Cigarettes   Start date: 07/11/1962   Quit date: 07/11/2016   Years since quitting: 6.5  Smokeless Tobacco Never  Tobacco Comments   Pt stopped smoking on Memorial Day weekend 2024. [facility] 11/29/22    Labs: Review Flowsheet       Latest Ref Rng & Units 03/06/2018 03/07/2018  Labs for ITP Cardiac and Pulmonary Rehab  Hemoglobin A1c 4.8 - 5.6 % - 6.5   Bicarbonate 20.0 - 28.0 mmol/L 22.9  -  TCO2 22 - 32 mmol/L 24  -  Acid-base deficit 0.0 - 2.0 mmol/L 1.0  -  O2 Saturation % 81.0  -    Details            Capillary Blood Glucose: Lab Results  Component Value Date   GLUCAP 92 08/18/2019   GLUCAP 86 03/09/2018   GLUCAP 104 (H) 03/09/2018   GLUCAP 139 (H) 03/08/2018   GLUCAP 225 (H) 03/07/2018     Pulmonary Assessment Scores:  Pulmonary Assessment Scores     Row Name 09/13/22 1037 12/24/22 0828       ADL UCSD   ADL Phase -- Exit    SOB Score total 58 49      CAT Score   CAT Score 19 11      mMRC Score   mMRC Score 4 4            UCSD: Self-administered rating of dyspnea associated with activities of daily living (ADLs) 6-point scale (0 = "not at all" to 5 = "maximal or unable to do because of breathlessness")  Scoring Scores range from 0 to 120.  Minimally important difference is 5 units  CAT: CAT can identify the health impairment of  COPD patients and is better correlated with disease progression.  CAT has a scoring range of zero to 40. The CAT score is classified into four groups of low (less than 10), medium (10 - 20), high (21-30) and very high (31-40) based on the impact level of disease on health status. A CAT score over 10 suggests significant symptoms.  A worsening CAT score could  be explained by an exacerbation, poor medication adherence, poor inhaler technique, or progression of COPD or comorbid conditions.  CAT MCID is 2 points  mMRC: mMRC (Modified Medical Research Council) Dyspnea Scale is used to assess the degree of baseline functional disability in patients of respiratory disease due to dyspnea. No minimal important difference is established. A decrease in score of 1 point or greater is considered a positive change.   Pulmonary Function Assessment:  Pulmonary Function Assessment - 09/13/22 1013       Breath   Bilateral Breath Sounds Clear   Expiratory wheezes with exertion   Shortness of Breath Yes;Limiting activity;Panic with Shortness of Breath             Exercise Target Goals: Exercise Program Goal: Individual exercise prescription set using results from initial 6 min walk test and THRR while considering  patient's activity barriers and safety.   Exercise Prescription Goal: Initial exercise prescription builds to 30-45 minutes a day of aerobic activity, 2-3 days per week.  Home exercise guidelines will be given to patient during program as part of exercise prescription that the participant will acknowledge.  Activity Barriers & Risk Stratification:  Activity Barriers & Cardiac Risk Stratification - 09/13/22 1008       Activity Barriers & Cardiac Risk Stratification   Activity Barriers Left Hip Replacement;Right Hip Replacement;Shortness of Breath;Muscular Weakness;Deconditioning;Balance Concerns             6 Minute Walk:  6 Minute Walk     Row Name 09/13/22 1147 12/17/22 0901        6 Minute Walk   Phase Initial Discharge    Distance 544 feet 1515 feet    Distance % Change -- 178.49 %    Distance Feet Change -- 971 ft    Walk Time 6 minutes 6 minutes    # of Rest Breaks 1  5:07-6:00 0    MPH 1.03 2.87    METS 1.5 3.46    RPE 17 11    Perceived Dyspnea  3 1    VO2 Peak 5.24 12.12    Symptoms Yes (comment) No    Comments Pt had to stop and rest at 5 min with accessory muscle use and sats dropping to 79%. O2 was palced @ 2L with sats recovering to 94% after O2 placed. --    Resting HR 69 bpm 88 bpm    Resting BP 140/68 --    Resting Oxygen Saturation  94 % 98 %    Exercise Oxygen Saturation  during 6 min walk 79 % 85 %    Max Ex. HR 96 bpm 112 bpm    Max Ex. BP 154/70 152/80    2 Minute Post BP 138/70 132/70      Interval HR   1 Minute HR 79 104    2 Minute HR 84 107    3 Minute HR 88 111    4 Minute HR 96 110    5 Minute HR 86 112    6 Minute HR 89 111    2 Minute Post HR 77 100    Interval Heart Rate? Yes Yes      Interval Oxygen   Interval Oxygen? -- Yes    Baseline Oxygen Saturation % 94 % 98 %    1 Minute Oxygen Saturation % 94 % 97 %    1 Minute Liters of Oxygen 0 L 2 L    2 Minute Oxygen Saturation % 95 %  93 %  1:48 85%    2 Minute Liters of Oxygen 0 L 2 L  3L    3 Minute Oxygen Saturation % 92 % 88 %    3 Minute Liters of Oxygen 0 L 3 L    4 Minute Oxygen Saturation % 95 % 92 %    4 Minute Liters of Oxygen 0 L 3 L    5 Minute Oxygen Saturation % 79 %  placed pt on 2L 94 %    5 Minute Liters of Oxygen 0 L 3 L    6 Minute Oxygen Saturation % 94 % 95 %    6 Minute Liters of Oxygen 2 L 3 L    2 Minute Post Oxygen Saturation % 96 % 98 %    2 Minute Post Liters of Oxygen 0 L 3 L             Oxygen Initial Assessment:  Oxygen Initial Assessment - 09/13/22 1012       Home Oxygen   Home Oxygen Device None    Sleep Oxygen Prescription None    Home Exercise Oxygen Prescription None    Home Resting Oxygen Prescription None       Initial 6 min Walk   Oxygen Used Continuous    Liters per minute 2      Program Oxygen Prescription   Program Oxygen Prescription Continuous    Liters per minute 2    Comments During walk test on RA, pt desaturated down to 79%. Placed on 2L [place].      Intervention   Short Term Goals To learn and exhibit compliance with exercise, home and travel O2 prescription;To learn and understand importance of maintaining oxygen saturations>88%;To learn and demonstrate proper use of respiratory medications;To learn and understand importance of monitoring SPO2 with pulse oximeter and demonstrate accurate use of the pulse oximeter.;To learn and demonstrate proper pursed lip breathing techniques or other breathing techniques.     Long  Term Goals Exhibits compliance with exercise, home  and travel O2 prescription;Maintenance of O2 saturations>88%;Compliance with respiratory medication;Verbalizes importance of monitoring SPO2 with pulse oximeter and return demonstration;Exhibits proper breathing techniques, such as pursed lip breathing or other method taught during program session;Demonstrates proper use of MDI's             Oxygen Re-Evaluation:  Oxygen Re-Evaluation     Row Name 09/13/22 1012 09/16/22 0908 10/16/22 1155 11/11/22 1135 12/13/22 1127     Program Oxygen Prescription   Program Oxygen Prescription -- Continuous Continuous Continuous Continuous   Liters per minute -- 2 2 2 2      Home Oxygen   Home Oxygen Device -- None None None None   Sleep Oxygen Prescription -- None None None None   Home Exercise Oxygen Prescription -- None None None None   Home Resting Oxygen Prescription -- None None None None     Goals/Expected Outcomes   Short Term Goals -- To learn and exhibit compliance with exercise, home and travel O2 prescription;To learn and understand importance of maintaining oxygen saturations>88%;To learn and demonstrate proper use of respiratory medications;To learn and understand  importance of monitoring SPO2 with pulse oximeter and demonstrate accurate use of the pulse oximeter.;To learn and demonstrate proper pursed lip breathing techniques or other breathing techniques.  To learn and exhibit compliance with exercise, home and travel O2 prescription;To learn and understand importance of maintaining oxygen saturations>88%;To learn and demonstrate proper use of respiratory medications;To learn and understand importance  of monitoring SPO2 with pulse oximeter and demonstrate accurate use of the pulse oximeter.;To learn and demonstrate proper pursed lip breathing techniques or other breathing techniques.  To learn and exhibit compliance with exercise, home and travel O2 prescription;To learn and understand importance of maintaining oxygen saturations>88%;To learn and demonstrate proper use of respiratory medications;To learn and understand importance of monitoring SPO2 with pulse oximeter and demonstrate accurate use of the pulse oximeter.;To learn and demonstrate proper pursed lip breathing techniques or other breathing techniques.  To learn and exhibit compliance with exercise, home and travel O2 prescription;To learn and understand importance of maintaining oxygen saturations>88%;To learn and demonstrate proper use of respiratory medications;To learn and understand importance of monitoring SPO2 with pulse oximeter and demonstrate accurate use of the pulse oximeter.;To learn and demonstrate proper pursed lip breathing techniques or other breathing techniques.    Long  Term Goals -- Exhibits compliance with exercise, home  and travel O2 prescription;Maintenance of O2 saturations>88%;Compliance with respiratory medication;Verbalizes importance of monitoring SPO2 with pulse oximeter and return demonstration;Exhibits proper breathing techniques, such as pursed lip breathing or other method taught during program session;Demonstrates proper use of MDI's Exhibits compliance with exercise, home  and  travel O2 prescription;Maintenance of O2 saturations>88%;Compliance with respiratory medication;Verbalizes importance of monitoring SPO2 with pulse oximeter and return demonstration;Exhibits proper breathing techniques, such as pursed lip breathing or other method taught during program session;Demonstrates proper use of MDI's Exhibits compliance with exercise, home  and travel O2 prescription;Maintenance of O2 saturations>88%;Compliance with respiratory medication;Verbalizes importance of monitoring SPO2 with pulse oximeter and return demonstration;Exhibits proper breathing techniques, such as pursed lip breathing or other method taught during program session;Demonstrates proper use of MDI's Exhibits compliance with exercise, home  and travel O2 prescription;Maintenance of O2 saturations>88%;Compliance with respiratory medication;Verbalizes importance of monitoring SPO2 with pulse oximeter and return demonstration;Exhibits proper breathing techniques, such as pursed lip breathing or other method taught during program session;Demonstrates proper use of MDI's   Comments [facility] to get patient home O2 -- -- --   Goals/Expected Outcomes Compliance with home oxygen when obtained. Compliance and understanding of oxygen saturation monitoring and breathing techniques to decrease shortness of breath. Compliance and understanding of oxygen saturation monitoring and breathing techniques to decrease shortness of breath. Compliance and understanding of oxygen saturation monitoring and breathing techniques to decrease shortness of breath. Compliance and understanding of oxygen saturation monitoring and breathing techniques to decrease shortness of breath.    Row Name 01/03/23 0832             Program Oxygen Prescription   Program Oxygen Prescription Continuous       Liters per minute 2         Home Oxygen   Home Oxygen Device None       Sleep Oxygen Prescription None        Home Exercise Oxygen Prescription None       Home Resting Oxygen Prescription None         Goals/Expected Outcomes   Short Term Goals To learn and exhibit compliance with exercise, home and travel O2 prescription;To learn and understand importance of maintaining oxygen saturations>88%;To learn and demonstrate proper use of respiratory medications;To learn and understand importance of monitoring SPO2 with pulse oximeter and demonstrate accurate use of the pulse oximeter.;To learn and demonstrate proper pursed lip breathing techniques or other breathing techniques.        Long  Term Goals Exhibits compliance with exercise, home  and travel O2 prescription;Maintenance of O2 saturations>88%;Compliance with respiratory medication;Verbalizes importance of monitoring SPO2 with pulse oximeter and return demonstration;Exhibits proper breathing techniques, such as pursed lip breathing or other method taught during program session;Demonstrates proper use of MDI's       Goals/Expected Outcomes Compliance and understanding of oxygen saturation monitoring and breathing techniques to decrease shortness of breath.                Oxygen Discharge (Final Oxygen Re-Evaluation):  Oxygen Re-Evaluation - 01/03/23 0832       Program Oxygen Prescription   Program Oxygen Prescription Continuous    Liters per minute 2      Home Oxygen   Home Oxygen Device None    Sleep Oxygen Prescription None    Home Exercise Oxygen Prescription None    Home Resting Oxygen Prescription None      Goals/Expected Outcomes   Short Term Goals To learn and exhibit compliance with exercise, home and travel O2 prescription;To learn and understand importance of maintaining oxygen saturations>88%;To learn and demonstrate proper use of respiratory medications;To learn and understand importance of monitoring SPO2 with pulse oximeter and demonstrate accurate use of the pulse oximeter.;To learn and demonstrate proper pursed lip breathing  techniques or other breathing techniques.     Long  Term Goals Exhibits compliance with exercise, home  and travel O2 prescription;Maintenance of O2 saturations>88%;Compliance with respiratory medication;Verbalizes importance of monitoring SPO2 with pulse oximeter and return demonstration;Exhibits proper breathing techniques, such as pursed lip breathing or other method taught during program session;Demonstrates proper use of MDI's    Goals/Expected Outcomes Compliance and understanding of oxygen saturation monitoring and breathing techniques to decrease shortness of breath.             Initial Exercise Prescription:  Initial Exercise Prescription - 09/13/22 1100       Date of Initial Exercise RX and Referring Provider   Date 09/13/22    [name])    Expected Discharge Date 12/10/22      Oxygen   Oxygen Intermittent    Liters 2    Maintain Oxygen Saturation 88% or higher      NuStep   Level 1    SPM 60    Minutes 30      Prescription Details   Frequency (times per week) 2    Duration Progress to 30 minutes of continuous aerobic without signs/symptoms of physical distress      Intensity   THRR 40-80% of Max Heartrate 59-118    Ratings of Perceived Exertion 11-13    Perceived Dyspnea 0-4      Progression   Progression Continue to progress workloads to maintain intensity without signs/symptoms of physical distress.      Resistance Training   Training Prescription Yes    Reps 10-15             Perform Capillary Blood Glucose checks as needed.  Exercise Prescription Changes:   Exercise Prescription Changes     Row Name 09/24/22 1200 10/08/22 1200 10/22/22 1200 11/05/22 1200 11/19/22 0900     Response to Exercise   Blood Pressure (Admit) 128/60 122/58 114/62 126/70 130/70   Blood Pressure (Exercise) 110/78 124/66 130/70 140/72 156/78   Blood Pressure (Exit) 120/70 122/74 122/64 130/80 132/70   Heart Rate (Admit) 81 bpm 69 bpm 92  bpm 82 bpm 70 bpm   Heart Rate (Exercise) 87 bpm 92 bpm 101 bpm 95 bpm 100 bpm   Heart  Rate (Exit) 88 bpm 73 bpm 78 bpm 88 bpm 82 bpm   Oxygen Saturation (Admit) 94 % 94 % 95 % 94 % 94 %   Oxygen Saturation (Exercise) 94 % 94 % 94 % 92 % 92 %   Oxygen Saturation (Exit) 95 % 94 % 96 % 92 % 95 %   Rating of Perceived Exertion (Exercise) 13 13 11 13 13    Perceived Dyspnea (Exercise) 1 2 1 1 2    Duration Continue with 30 min of aerobic exercise without signs/symptoms of physical distress. Continue with 30 min of aerobic exercise without signs/symptoms of physical distress. Continue with 30 min of aerobic exercise without signs/symptoms of physical distress. Continue with 30 min of aerobic exercise without signs/symptoms of physical distress. Continue with 30 min of aerobic exercise without signs/symptoms of physical distress.   Intensity THRR unchanged THRR unchanged THRR unchanged THRR unchanged THRR unchanged     Progression   Progression Continue to progress workloads to maintain intensity without signs/symptoms of physical distress. Continue to progress workloads to maintain intensity without signs/symptoms of physical distress. Continue to progress workloads to maintain intensity without signs/symptoms of physical distress. Continue to progress workloads to maintain intensity without signs/symptoms of physical distress. Continue to progress workloads to maintain intensity without signs/symptoms of physical distress.   Average METs -- -- -- -- 2.5     Resistance Training   Training Prescription Yes Yes Yes Yes Yes   Weight red bands red bands red bands red bands black bands   Reps 10-15 10-15 10-15 10-15 10-15   Time 10 Minutes 10 Minutes 10 Minutes 10 Minutes 10 Minutes     Oxygen   Oxygen Continuous Continuous Continuous Continuous Continuous   Liters 2 2 2 2 2      NuStep   Level 1 2 2 2 3    SPM 60 60 -- -- 60   Minutes 30 15 15 15 15    METs 1.8 1.9 2 1.9 2.1     Track   Laps -- 8  11 8  10.5   Minutes -- 15 15 15 15    METs -- 2.23 2.69 2.23 2.6     Home Exercise Plan   Plans to continue exercise at -- -- -- -- [employer] (comment)   Frequency -- -- -- -- Add 1 additional day to program exercise sessions.   Initial Home Exercises Provided -- -- -- -- 11/19/22     Oxygen   Maintain Oxygen Saturation 88% or higher 88% or higher 88% or higher 88% or higher 88% or higher    Row Name 12/03/22 0900 12/17/22 0900 12/26/22 0958 01/09/23 0943       Response to Exercise   Blood Pressure (Admit) 140/68 126/70 128/64 104/70    Blood Pressure (Exercise) 134/72 136/74 -- --    Blood Pressure (Exit) 132/66 114/70 104/60 122/56    Heart Rate (Admit) 76 bpm 96 bpm 72 bpm 77 bpm    Heart Rate (Exercise) 97 bpm 112 bpm 108 bpm 99 bpm    Heart Rate (Exit) 79 bpm 94 bpm 82 bpm 92 bpm    Oxygen Saturation (Admit) 97 % 94 % 96 % 92 %    Oxygen Saturation (Exercise) 93 % 85 % 91 % 94 %    Oxygen Saturation (Exit) 95 % 92 % 95 % 94 %    Rating of Perceived Exertion (Exercise) 15 11.5 15 17     Perceived Dyspnea (Exercise) 3 3 2  3  Duration Continue with 30 min of aerobic exercise without signs/symptoms of physical distress. Continue with 30 min of aerobic exercise without signs/symptoms of physical distress. Continue with 30 min of aerobic exercise without signs/symptoms of physical distress. Continue with 30 min of aerobic exercise without signs/symptoms of physical distress.    Intensity THRR unchanged THRR unchanged THRR unchanged THRR unchanged      Progression   Progression Continue to progress workloads to maintain intensity without signs/symptoms of physical distress. Continue to progress workloads to maintain intensity without signs/symptoms of physical distress. Continue to progress workloads to maintain intensity without signs/symptoms of physical distress. Continue to progress workloads to maintain intensity without signs/symptoms of physical distress.       Resistance Training   Training Prescription Yes Yes Yes Yes    Weight blue bands blue bands blue bands blue bands    Reps 10-15 10-15 10-15 10-15    Time 10 Minutes 10 Minutes 10 Minutes 10 Minutes      Oxygen   Oxygen Continuous Continuous Continuous Continuous    Liters 2 2 2 2       NuStep   Level 3 3 3 4     Minutes 15 15 15 15     METs 2.1 2.5 2.8 2.5      Track   Laps 11 -- 12 13    Minutes 15 -- 15 15    METs 2.69 -- 2.85 3      Oxygen   Maintain Oxygen Saturation 88% or higher 88% or higher 88% or higher 88% or higher             Exercise Comments:   Exercise Comments     Row Name 09/19/22 1217 11/19/22 4098         Exercise Comments Pt completed first day of group exercise. He exercised 30 min on the recumbent bike at level 1, METs 1.7. tolerated well. Pt performed warm up and cool down following demonstrative and verbal cues, including squats. Discussed METs with good reception. will progress as tolerated. Come home [facility] with [facility]. Raymie is currently exercising inconsistently at home. He walks 1 non-rehab/day for 5 min/day. I encouraged Rogerio to use his living facility's fitness center or walk laps around his living facility. He agreed to walking 1-2 days/wk for 30 min/day. I mentioned that the 30 min could be divided into 2x15 min or 3x10 min. He agreed with my recommendations. I am unsure how motivated Isom is to exercise at home. Will continue to follow up.               Exercise Goals and Review:   Exercise Goals     Row Name 09/13/22 1011 09/16/22 0906 10/16/22 1147 11/11/22 1126 12/13/22 1120     Exercise Goals   Increase Physical Activity Yes Yes Yes Yes Yes   Intervention Provide advice, education, support and counseling about physical activity/exercise needs.;Develop an individualized exercise prescription for aerobic and resistive training based on initial evaluation findings, risk stratification, comorbidities and participant's personal goals.  Provide advice, education, support and counseling about physical activity/exercise needs.;Develop an individualized exercise prescription for aerobic and resistive training based on initial evaluation findings, risk stratification, comorbidities and participant's personal goals. Provide advice, education, support and counseling about physical activity/exercise needs.;Develop an individualized exercise prescription for aerobic and resistive training based on initial evaluation findings, risk stratification, comorbidities and participant's personal goals. Provide advice, education, support and counseling about physical activity/exercise needs.;Develop an individualized exercise prescription for aerobic and  resistive training based on initial evaluation findings, risk stratification, comorbidities and participant's personal goals. Provide advice, education, support and counseling about physical activity/exercise needs.;Develop an individualized exercise prescription for aerobic and resistive training based on initial evaluation findings, risk stratification, comorbidities and participant's personal goals.   Expected Outcomes Long Term: Add in home exercise to make exercise part of routine and to increase amount of physical activity.;Short Term: Attend rehab on a regular basis to increase amount of physical activity.;Long Term: Exercising regularly at least 3-5 days a week. Long Term: Add in home exercise to make exercise part of routine and to increase amount of physical activity.;Short Term: Attend rehab on a regular basis to increase amount of physical activity.;Long Term: Exercising regularly at least 3-5 days a week. Long Term: Add in home exercise to make exercise part of routine and to increase amount of physical activity.;Short Term: Attend rehab on a regular basis to increase amount of physical activity.;Long Term: Exercising regularly at least 3-5 days a week. Long Term: Add in home exercise to make exercise  part of routine and to increase amount of physical activity.;Short Term: Attend rehab on a regular basis to increase amount of physical activity.;Long Term: Exercising regularly at least 3-5 days a week. Long Term: Add in home exercise to make exercise part of routine and to increase amount of physical activity.;Short Term: Attend rehab on a regular basis to increase amount of physical activity.;Long Term: Exercising regularly at least 3-5 days a week.   Increase Strength and Stamina Yes Yes Yes Yes Yes   Intervention Provide advice, education, support and counseling about physical activity/exercise needs.;Develop an individualized exercise prescription for aerobic and resistive training based on initial evaluation findings, risk stratification, comorbidities and participant's personal goals. Provide advice, education, support and counseling about physical activity/exercise needs.;Develop an individualized exercise prescription for aerobic and resistive training based on initial evaluation findings, risk stratification, comorbidities and participant's personal goals. Provide advice, education, support and counseling about physical activity/exercise needs.;Develop an individualized exercise prescription for aerobic and resistive training based on initial evaluation findings, risk stratification, comorbidities and participant's personal goals. Provide advice, education, support and counseling about physical activity/exercise needs.;Develop an individualized exercise prescription for aerobic and resistive training based on initial evaluation findings, risk stratification, comorbidities and participant's personal goals. Provide advice, education, support and counseling about physical activity/exercise needs.;Develop an individualized exercise prescription for aerobic and resistive training based on initial evaluation findings, risk stratification, comorbidities and participant's personal goals.   Expected Outcomes  Short Term: Increase workloads from initial exercise prescription for resistance, speed, and METs.;Short Term: Perform resistance training exercises routinely during rehab and add in resistance training at home;Long Term: Improve cardiorespiratory fitness, muscular endurance and strength as measured by increased METs and functional capacity ( ) Short Term: Increase workloads from initial exercise prescription for resistance, speed, and METs.;Short Term: Perform resistance training exercises routinely during rehab and add in resistance training at home;Long Term: Improve cardiorespiratory fitness, muscular endurance and strength as measured by increased METs and functional capacity ( ) Short Term: Increase workloads from initial exercise prescription for resistance, speed, and METs.;Short Term: Perform resistance training exercises routinely during rehab and add in resistance training at home;Long Term: Improve cardiorespiratory fitness, muscular endurance and strength as measured by increased METs and functional capacity ( ) Short Term: Increase workloads from initial exercise prescription for resistance, speed, and METs.;Short Term: Perform resistance training exercises routinely during rehab and add in resistance training at home;Long Term: Improve cardiorespiratory fitness, muscular endurance and strength  as measured by increased METs and functional capacity ( ) Short Term: Increase workloads from initial exercise prescription for resistance, speed, and METs.;Short Term: Perform resistance training exercises routinely during rehab and add in resistance training at home;Long Term: Improve cardiorespiratory fitness, muscular endurance and strength as measured by increased METs and functional capacity ( )   Able to understand and use rate of perceived exertion (RPE) scale Yes Yes Yes Yes Yes   Intervention Provide education and explanation on how to use RPE scale Provide education and explanation on  how to use RPE scale Provide education and explanation on how to use RPE scale Provide education and explanation on how to use RPE scale Provide education and explanation on how to use RPE scale   Expected Outcomes Short Term: Able to use RPE daily in rehab to express subjective intensity level;Long Term:  Able to use RPE to guide intensity level when exercising independently Short Term: Able to use RPE daily in rehab to express subjective intensity level;Long Term:  Able to use RPE to guide intensity level when exercising independently Short Term: Able to use RPE daily in rehab to express subjective intensity level;Long Term:  Able to use RPE to guide intensity level when exercising independently Short Term: Able to use RPE daily in rehab to express subjective intensity level;Long Term:  Able to use RPE to guide intensity level when exercising independently Short Term: Able to use RPE daily in rehab to express subjective intensity level;Long Term:  Able to use RPE to guide intensity level when exercising independently   Able to understand and use Dyspnea scale Yes Yes Yes Yes Yes   Intervention Provide education and explanation on how to use Dyspnea scale Provide education and explanation on how to use Dyspnea scale Provide education and explanation on how to use Dyspnea scale Provide education and explanation on how to use Dyspnea scale Provide education and explanation on how to use Dyspnea scale   Expected Outcomes Short Term: Able to use Dyspnea scale daily in rehab to express subjective sense of shortness of breath during exertion;Long Term: Able to use Dyspnea scale to guide intensity level when exercising independently Short Term: Able to use Dyspnea scale daily in rehab to express subjective sense of shortness of breath during exertion;Long Term: Able to use Dyspnea scale to guide intensity level when exercising independently Short Term: Able to use Dyspnea scale daily in rehab to express subjective  sense of shortness of breath during exertion;Long Term: Able to use Dyspnea scale to guide intensity level when exercising independently Short Term: Able to use Dyspnea scale daily in rehab to express subjective sense of shortness of breath during exertion;Long Term: Able to use Dyspnea scale to guide intensity level when exercising independently Short Term: Able to use Dyspnea scale daily in rehab to express subjective sense of shortness of breath during exertion;Long Term: Able to use Dyspnea scale to guide intensity level when exercising independently   Knowledge and understanding of Target Heart Rate Range (THRR) Yes Yes Yes Yes Yes   Intervention Provide education and explanation of THRR including how the numbers were predicted and where they are located for reference Provide education and explanation of THRR including how the numbers were predicted and where they are located for reference Provide education and explanation of THRR including how the numbers were predicted and where they are located for reference Provide education and explanation of THRR including how the numbers were predicted and where they are located for reference Provide education and  explanation of THRR including how the numbers were predicted and where they are located for reference   Expected Outcomes Short Term: Able to state/look up THRR;Short Term: Able to use daily as guideline for intensity in rehab;Long Term: Able to use THRR to govern intensity when exercising independently Short Term: Able to state/look up THRR;Short Term: Able to use daily as guideline for intensity in rehab;Long Term: Able to use THRR to govern intensity when exercising independently Short Term: Able to state/look up THRR;Short Term: Able to use daily as guideline for intensity in rehab;Long Term: Able to use THRR to govern intensity when exercising independently Short Term: Able to state/look up THRR;Short Term: Able to use daily as guideline for intensity  in rehab;Long Term: Able to use THRR to govern intensity when exercising independently Short Term: Able to state/look up THRR;Short Term: Able to use daily as guideline for intensity in rehab;Long Term: Able to use THRR to govern intensity when exercising independently   Understanding of Exercise Prescription Yes Yes Yes Yes Yes   Intervention Provide education, explanation, and written materials on patient's individual exercise prescription Provide education, explanation, and written materials on patient's individual exercise prescription Provide education, explanation, and written materials on patient's individual exercise prescription Provide education, explanation, and written materials on patient's individual exercise prescription Provide education, explanation, and written materials on patient's individual exercise prescription   Expected Outcomes Short Term: Able to explain program exercise prescription;Long Term: Able to explain home exercise prescription to exercise independently Short Term: Able to explain program exercise prescription;Long Term: Able to explain home exercise prescription to exercise independently Short Term: Able to explain program exercise prescription;Long Term: Able to explain home exercise prescription to exercise independently Short Term: Able to explain program exercise prescription;Long Term: Able to explain home exercise prescription to exercise independently Short Term: Able to explain program exercise prescription;Long Term: Able to explain home exercise prescription to exercise independently    Row Name 01/03/23 0827             Exercise Goals   Increase Physical Activity Yes       Intervention Provide advice, education, support and counseling about physical activity/exercise needs.;Develop an individualized exercise prescription for aerobic and resistive training based on initial evaluation findings, risk stratification, comorbidities and participant's personal  goals.       Expected Outcomes Long Term: Add in home exercise to make exercise part of routine and to increase amount of physical activity.;Short Term: Attend rehab on a regular basis to increase amount of physical activity.;Long Term: Exercising regularly at least 3-5 days a week.       Increase Strength and Stamina Yes       Intervention Provide advice, education, support and counseling about physical activity/exercise needs.;Develop an individualized exercise prescription for aerobic and resistive training based on initial evaluation findings, risk stratification, comorbidities and participant's personal goals.       Expected Outcomes Short Term: Increase workloads from initial exercise prescription for resistance, speed, and METs.;Short Term: Perform resistance training exercises routinely during rehab and add in resistance training at home;Long Term: Improve cardiorespiratory fitness, muscular endurance and strength as measured by increased METs and functional capacity ( )       Able to understand and use rate of perceived exertion (RPE) scale Yes       Intervention Provide education and explanation on how to use RPE scale       Expected Outcomes Short Term: Able to use RPE daily in rehab to express  subjective intensity level;Long Term:  Able to use RPE to guide intensity level when exercising independently       Able to understand and use Dyspnea scale Yes       Intervention Provide education and explanation on how to use Dyspnea scale       Expected Outcomes Short Term: Able to use Dyspnea scale daily in rehab to express subjective sense of shortness of breath during exertion;Long Term: Able to use Dyspnea scale to guide intensity level when exercising independently       Knowledge and understanding of Target Heart Rate Range (THRR) Yes       Intervention Provide education and explanation of THRR including how the numbers were predicted and where they are located for reference       Expected  Outcomes Short Term: Able to state/look up THRR;Short Term: Able to use daily as guideline for intensity in rehab;Long Term: Able to use THRR to govern intensity when exercising independently       Understanding of Exercise Prescription Yes       Intervention Provide education, explanation, and written materials on patient's individual exercise prescription       Expected Outcomes Short Term: Able to explain program exercise prescription;Long Term: Able to explain home exercise prescription to exercise independently                Exercise Goals Re-Evaluation :  Exercise Goals Re-Evaluation     Row Name 09/16/22 0906 10/16/22 1147 11/11/22 1126 12/13/22 1120 01/03/23 0827     Exercise Goal Re-Evaluation   Exercise Goals Review Increase Physical Activity;Able to understand and use Dyspnea scale;Understanding of Exercise Prescription;Increase Strength and Stamina;Knowledge and understanding of Target Heart Rate Range (THRR);Able to understand and use rate of perceived exertion (RPE) scale Increase Physical Activity;Able to understand and use Dyspnea scale;Understanding of Exercise Prescription;Increase Strength and Stamina;Knowledge and understanding of Target Heart Rate Range (THRR);Able to understand and use rate of perceived exertion (RPE) scale Increase Physical Activity;Able to understand and use Dyspnea scale;Understanding of Exercise Prescription;Increase Strength and Stamina;Knowledge and understanding of Target Heart Rate Range (THRR);Able to understand and use rate of perceived exertion (RPE) scale Increase Physical Activity;Able to understand and use Dyspnea scale;Understanding of Exercise Prescription;Increase Strength and Stamina;Knowledge and understanding of Target Heart Rate Range (THRR);Able to understand and use rate of perceived exertion (RPE) scale Increase Physical Activity;Able to understand and use Dyspnea scale;Understanding of Exercise Prescription;Increase Strength and  Stamina;Knowledge and understanding of Target Heart Rate Range (THRR);Able to understand and use rate of perceived exertion (RPE) scale   Comments Pt is scheduled to start exercise this week. Will continue to monitor and progress as able. Shakil has completed 8 exercise sessions. He exercises for 15 min on the track and Nustep. Ludwig averages 2.54 METs on the track and 1.9 METs at level 2 on the Nustep. He performs the warmup and cooldown standing without limitations. Miking has slightly increased his track laps and METs. Choua is deconditioning and somewhat difficult to progress. Will continue to monitor and progress able. Issak has completed 12 exercise sessions. He exercises for 15 min on the track and Nustep. Bradan averages 2.54 METs on the track and 2.2 METs at level 2 on the Nustep. He performs the warmup and cooldown standing without limitations. Eisa has increased her workload for the Nustep as METs have slightly increased. He is still deconditioned, which is why he has not had much improvement in his METs. Danney seems motivated to exercise as  he enjoys class. Will continue to monitor and progress as able. Isauro has completed 23 exercise sessions. He exercises for 15 min on the track and Nustep. Darrin averages 2.7 METs on the track and 2.8 METs at level 2 on the Nustep. He performs the warmup and cooldown standing without limitations. Devonta has increased her workload for the Nustep as METs have slightly increased. Lyman is close to graduation as he feels PR has not benefited him much. Veldon has improved functional capacity since starting PR. He is very deconditioned, which is why his improvements have not been tremendously great. Will continue to monitor and progress as able. Lamarr has completed 28 exercise sessions. He exercises for 15 min on the track and Nustep. Aurel averages 2.7 METs on the track and 2.8 METs at level 4 on the Nustep. He performs the warmup and cooldown standing without limitations.  Zakaria has increased her workload for the Nustep as METs have remained the same. Dashton has extended his time to help increase his progress. He does not feel rehab has made a significant difference although his numbers have showed improvement. He has also starting walking at home. He is not walking consistently but is encouraged to. Will continue to monitor and progress as able.   Expected Outcomes Through exercise at rehab and home, the patient will decrease shortness of breath with daily activities and feel confident in carrying out an exercise regimen at home. Through exercise at rehab and home, the patient will decrease shortness of breath with daily activities and feel confident in carrying out an exercise regimen at home. Through exercise at rehab and home, the patient will decrease shortness of breath with daily activities and feel confident in carrying out an exercise regimen at home. Through exercise at rehab and home, the patient will decrease shortness of breath with daily activities and feel confident in carrying out an exercise regimen at home. Through exercise at rehab and home, the patient will decrease shortness of breath with daily activities and feel confident in carrying out an exercise regimen at home.            Discharge Exercise Prescription (Final Exercise Prescription Changes):  Exercise Prescription Changes - 01/09/23 0943       Response to Exercise   Blood Pressure (Admit) 104/70    Blood Pressure (Exit) 122/56    Heart Rate (Admit) 77 bpm    Heart Rate (Exercise) 99 bpm    Heart Rate (Exit) 92 bpm    Oxygen Saturation (Admit) 92 %    Oxygen Saturation (Exercise) 94 %    Oxygen Saturation (Exit) 94 %    Rating of Perceived Exertion (Exercise) 17    Perceived Dyspnea (Exercise) 3    Duration Continue with 30 min of aerobic exercise without signs/symptoms of physical distress.    Intensity THRR unchanged      Progression   Progression Continue to progress workloads  to maintain intensity without signs/symptoms of physical distress.      Resistance Training   Training Prescription Yes    Weight blue bands    Reps 10-15    Time 10 Minutes      Oxygen   Oxygen Continuous    Liters 2      NuStep   Level 4    Minutes 15    METs 2.5      Track   Laps 13    Minutes 15    METs 3      Oxygen  Maintain Oxygen Saturation 88% or higher             Nutrition:  Target Goals: Understanding of nutrition guidelines, daily intake of sodium 1500mg , cholesterol 200mg , calories 30% from fat and 7% or less from saturated fats, daily to have 5 or more servings of fruits and vegetables.  Biometrics:  Pre Biometrics - 09/13/22 0955       Pre Biometrics   Grip Strength 40 kg              Nutrition Therapy Plan and Nutrition Goals:  Nutrition Therapy & Goals - 01/07/23 1347       Nutrition Therapy   Diet Heart  Healthy Diet      Personal Nutrition Goals   Nutrition Goal Patient to improve diet quality by using the plate method as a guide for meal planning to include lean protein/plant protein, fruits, vegetables, whole grains, nonfat dairy as part of a well-balanced diet.    Comments Goals in progress. Remer reports stable appetite and standard eating patterns. He lives alone in an independent living community and does his own grocery shopping and cooking. Lyonel has newly quit smoking. He is down ~12.3# since starting with our program; BMI remains appropriate for age. His doctor has recommended starting a statin medication but patient declines. Stressed the importance of increasing dietary fiber and reducing saturated fat intake. Mansfield will benefit from participation in pulmonary rehab for nutrition and exercise support.      Intervention Plan   Intervention Prescribe, educate and counsel regarding individualized specific dietary modifications aiming towards targeted core components such as weight, hypertension, lipid management, diabetes,  heart failure and other comorbidities.;Nutrition handout(s) given to patient.    Expected Outcomes Short Term Goal: Understand basic principles of dietary content, such as calories, fat, sodium, cholesterol and nutrients.;Long Term Goal: Adherence to prescribed nutrition plan.             Nutrition Assessments:  Nutrition Assessments - 12/24/22 1136       Rate Your Plate Scores   Post Score 65            MEDIFICTS Score Key: ?70 Need to make dietary changes  40-70 Heart Healthy Diet ? 40 Therapeutic Level Cholesterol Diet  Flowsheet Row PULMONARY REHAB OTHER RESPIRATORY from 12/24/2022 in [facility]  Picture Your Plate Total Score on Discharge 65      Picture Your Plate Scores: <14 Unhealthy dietary pattern with much room for improvement. 41-50 Dietary pattern unlikely to meet recommendations for good health and room for improvement. 51-60 More healthful dietary pattern, with some room for improvement.  >60 Healthy dietary pattern, although there may be some specific behaviors that could be improved.    Nutrition Goals Re-Evaluation:  Nutrition Goals Re-Evaluation     Row Name 09/19/22 1156 10/17/22 1448 11/14/22 1105 12/10/22 1023 01/07/23 1347     Goals   Current Weight 189 lb 6 oz (85.9 kg) 183 lb 13.8 oz (83.4 kg) 184 lb 4.9 oz (83.6 kg) 179 lb 14.3 oz (81.6 kg) 175 lb 4.3 oz (79.5 kg)   Comment A1c 6.1, cholesterol 226, triglycerides 185, HDL 39, LDL 150 No new labs; most recent labs A1c 6.1, cholesterol 226, triglycerides 185, HDL 39, LDL 150. He is down 4.2# since starting with our program. No new labs; most recent labs A1c 6.1, cholesterol 226, triglycerides 185, HDL 39, LDL 150. A1c 6.1, cholesterol 233, LDL 178; triglycerides,  HDL  improved WNL no new labs; most recent labs A1c 6.1, cholesterol 233, LDL 178; triglycerides, HDL improved WNL   Expected Outcome Demetrus reports stable appetite and standard  eating patterns. He lives alone and does his own grocery shopping and cooking. He is currently working on smoking cessation. Mykah will benefit from participation in pulmonary rehab for nutrition and exercise support. Hairo reports stable appetite and standard eating patterns. He lives alone and does his own grocery shopping and cooking. He is currently working on smoking cessation. Merlen will benefit from participation in pulmonary rehab for nutrition and exercise support. Goals in progress. Zayn reports stable appetite and standard eating patterns. He lives alone and does his own grocery shopping and cooking. Philipp has newly quit smoking. He is down ~3.7# since starting with our program. Claire will benefit from participation in pulmonary rehab for nutrition and exercise support. Goals in progress. Demarquis reports stable appetite and standard eating patterns. He lives alone in an independent living community and does his own grocery shopping and cooking. Ivey has newly quit smoking. He is down ~8.14# since starting with our program. His doctor has recommended starting a statin medication. Kelvin will benefit from participation in pulmonary rehab for nutrition and exercise support. Goals in progress. Markice reports stable appetite and standard eating patterns. He lives alone in an independent living community and does his own grocery shopping and cooking. Toron has newly quit smoking. He is down ~12.3# since starting with our program; BMI remains appropriate for age. His doctor has recommended starting a statin medication but patient declines. Stressed the importance of increasing dietary fiber and reducing saturated fat intake. Americo will benefit from participation in pulmonary rehab for nutrition and exercise support.            Nutrition Goals Discharge (Final Nutrition Goals Re-Evaluation):  Nutrition Goals Re-Evaluation - 01/07/23 1347       Goals   Current Weight 175 lb 4.3 oz (79.5 kg)    Comment  no new labs; most recent labs A1c 6.1, cholesterol 233, LDL 178; triglycerides, HDL improved WNL    Expected Outcome Goals in progress. Mang reports stable appetite and standard eating patterns. He lives alone in an independent living community and does his own grocery shopping and cooking. Abubakar has newly quit smoking. He is down ~12.3# since starting with our program; BMI remains appropriate for age. His doctor has recommended starting a statin medication but patient declines. Stressed the importance of increasing dietary fiber and reducing saturated fat intake. Garner will benefit from participation in pulmonary rehab for nutrition and exercise support.             Psychosocial: Target Goals: Acknowledge presence or absence of significant depression and/or stress, maximize coping skills, provide positive support system. Participant is able to verbalize types and ability to use techniques and skills needed for reducing stress and depression.  Initial Review & Psychosocial Screening:  Initial Psych Review & Screening - 09/13/22 1014       Initial Review   Current issues with Current Stress Concerns    Source of Stress Concerns Family;Financial;Unable to participate in former interests or hobbies    Comments Pt is currently concerned about not having a relationship with his 2 children, feels ashamed of smoking, and has little interest in doing things, grumpy about his medical care, [facility], and getting prescriptions in a timely manner      Family Dynamics   Good Support System? Yes    Comments  Friends from living facility      Barriers   Psychosocial barriers to participate in program The patient should benefit from training in stress management and relaxation.;Psychosocial barriers identified (see note)      Screening Interventions   Interventions Encouraged to exercise;Provide feedback about the scores to participant;To provide support and resources with identified psychosocial needs     Expected Outcomes Long Term Goal: Stressors or current issues are controlled or eliminated.;Short Term goal: Identification and review with participant of any Quality of Life or Depression concerns found by scoring the questionnaire.;Long Term goal: The participant improves quality of Life and PHQ9 Scores as seen by post scores and/or verbalization of changes;Short Term goal: Utilizing psychosocial counselor, staff and physician to assist with identification of specific Stressors or current issues interfering with healing process. Setting desired goal for each stressor or current issue identified.   Pt scored high on PHQ2/9. Pt denies current depression although speaking w/patient there are possible signs. Pt currently declines to speak to a therapist & making an appt for follow up with his PCP. We will continue to assess and monitor his needs.            Quality of Life Scores:  Scores of 19 and below usually indicate a poorer quality of life in these areas.  A difference of  2-3 points is a clinically meaningful difference.  A difference of 2-3 points in the total score of the Quality of Life Index has been associated with significant improvement in overall quality of life, self-image, physical symptoms, and general health in studies assessing change in quality of life.  PHQ-9: Review Flowsheet  More data may exist      12/24/2022 09/13/2022 04/24/2018 07/02/2017 03/13/2017  Depression screen PHQ 2/9  Decreased Interest 0 1 0 1 0 0  Down, Depressed, Hopeless 0 1 0 1 0 0  PHQ - 2 Score 0 2 0 2 0 0  Altered sleeping 0 1 0 0 - -  Tired, decreased energy 0 2 3 3  - -  Change in appetite 0 0 0 0 - -  Feeling bad or failure about yourself  0 0 2 1 - -  Trouble concentrating 0 2 1 1  - -  Moving slowly or fidgety/restless 0 0 0 0 - -  Suicidal thoughts 0 0 0 0 - -  PHQ-9 Score 0 7 6 7  - -  Difficult doing work/chores Not difficult at all Very difficult Very difficult - -    Details        Multiple values from one day are sorted in reverse-chronological order        Interpretation of Total Score  Total Score Depression Severity:  1-4 = Minimal depression, 5-9 = Mild depression, 10-14 = Moderate depression, 15-19 = Moderately severe depression, 20-27 = Severe depression   Psychosocial Evaluation and Intervention:  Psychosocial Evaluation - 09/13/22 1019       Psychosocial Evaluation & Interventions   Interventions Stress management education;Encouraged to exercise with the program and follow exercise prescription    Comments Pt is currently concerned about not having a relationship with his 2 children, feels ashamed of still smoking, has low self worth, little interest in doing things, and  has a grumpy disposition due to his medical care, [facility], and getting prescriptions in a timely manner    Expected Outcomes Pt will report and employ positive and healthy techniques for stress management. Currently declines referral to his PCP and a therapist  Continue Psychosocial Services  Follow up required by staff   We will continue to monitor and assess            Psychosocial Re-Evaluation:  Psychosocial Re-Evaluation     Row Name 09/16/22 0948 10/14/22 1515 11/08/22 1116 12/13/22 0918 01/08/23 1155     Psychosocial Re-Evaluation   Current issues with Current Stress Concerns Current Stress Concerns Current Stress Concerns Current Stress Concerns None Identified   Comments No change since assessment on 09/13/2022. Elmo currently states that his stress level is lower than before. He states that he likes having extra resources available to him through Pulmonary Rehab. Staff was able to get Silas home oxygen and he states he is "breathing better". Landen states that his ex-wife has been supportive and he's feeling better since his last exacerbation. Zabdiel has cut back his smoking and looks forward to quitting for good. He states nothing has changed with the relationship with his  children nor the finances. He states his rent and prices in general are increasing. Deiondre declined resources on food and housing. Izsak has a lot of stress due to his decline in health and other factors. Since starting the program, he still has financial strain of increased rent. He is trying to quit smoking and is trying to build a relationship back with his kids. He also doesn't see eye to eye with his retirement community. When he attends [facility] he always comes with a complaint or has an uneventful trip. For example, he locked his keys in his car and had to get a ride home from a friend. Then he left his cell phone and wallet in the friend's car. Yesterday he mistakenly had his home O2 on 8L instead of 2L and his tank was empty. He also was upset about the parking at his retirement facility. We have provided and practiced resources with Krishav on stress relief, meditation, tapping, and visualization but to no avail. Iren declines a referral to a mental health professional at this time. Kaidence has done a complete turn around in the program. We have assisted Connery in speaking to his pulmonologist and his decline and have gotten him started on new medication. He is finally feeling and breathing a lot better. He is making great progress in the class and has a new positive attitude. He is still upset about his rent increasing but he is making ends meet. He is also enjoying the camaraderie and potlucks at his living facility. He denies any needs currently. Evander denies any new stressors at this time. He states "the older I get the more I appreciate life".   Expected Outcomes For Cortland to attend PR without any psychosocial barriers or concerns. For Jamani to attend PR without any psychosocial barriers or concerns and to use positive self-care techniques to manage his stress. For Semaj to attend PR without any psychosocial barriers or concerns and to use positive self-care techniques to manage his stress. For Nivek to  attend PR without any psychosocial barriers or concerns and to use positive self-care techniques to manage his stress. For Therman to attend PR without any psychosocial barriers or concerns.   Interventions Encouraged to attend Pulmonary Rehabilitation for the exercise;Stress management education Encouraged to attend Pulmonary Rehabilitation for the exercise;Stress management education Encouraged to attend Pulmonary Rehabilitation for the exercise;Stress management education Encouraged to attend Pulmonary Rehabilitation for the exercise;Stress management education Encouraged to attend Pulmonary Rehabilitation for the exercise   Continue Psychosocial Services  Follow up required by  staff Follow up required by staff Follow up required by staff  We will continue to monitor and assess Antaeus Follow up required by staff  We will continue to monitor and assess Sly No Follow up required            Psychosocial Discharge (Final Psychosocial Re-Evaluation):  Psychosocial Re-Evaluation - 01/08/23 1155       Psychosocial Re-Evaluation   Current issues with None Identified    Comments Ugochukwu denies any new stressors at this time. He states "the older I get the more I appreciate life".    Expected Outcomes For Iziah to attend PR without any psychosocial barriers or concerns.    Interventions Encouraged to attend Pulmonary Rehabilitation for the exercise    Continue Psychosocial Services  No Follow up required             Education: Education Goals: Education classes will be provided on a weekly basis, covering required topics. Participant will state understanding/return demonstration of topics presented.  Learning Barriers/Preferences:  Learning Barriers/Preferences - 09/13/22 1153       Learning Barriers/Preferences   Learning Barriers Sight;Hearing    Learning Preferences Group Instruction;Individual Instruction;Verbal Instruction;Written Material;Skilled Demonstration              Education Topics: Introduction to Pulmonary Rehab Group instruction provided by PowerPoint, verbal discussion, and written material to support subject matter. Instructor reviews what Pulmonary Rehab is, the purpose of the program, and how patients are referred.     Know Your Numbers Group instruction that is supported by a PowerPoint presentation. Instructor discusses importance of knowing and understanding resting, exercise, and post-exercise oxygen saturation, heart rate, and blood pressure. Oxygen saturation, heart rate, blood pressure, rating of perceived exertion, and dyspnea are reviewed along with a normal range for these values.  Flowsheet Row PULMONARY REHAB OTHER RESPIRATORY from 12/19/2022 in [facility] for Heart, Vascular, & Lung Health  Date 12/19/22  Educator EP  Instruction Review Code 1- Verbalizes Understanding       Exercise for the Pulmonary Patient Group instruction that is supported by a PowerPoint presentation. Instructor discusses benefits of exercise, core components of exercise, frequency, duration, and intensity of an exercise routine, importance of utilizing pulse oximetry during exercise, safety while exercising, and options of places to exercise outside of rehab.  Flowsheet Row PULMONARY REHAB OTHER RESPIRATORY from 12/05/2022 in [facility] for Heart, Vascular, & Lung Health  Date 12/05/22  Educator EP  Instruction Review Code 1- Verbalizes Understanding          MET Level  Group instruction provided by PowerPoint, verbal discussion, and written material to support subject matter. Instructor reviews what METs are and how to increase METs.  Flowsheet Row PULMONARY REHAB OTHER RESPIRATORY from 11/14/2022 in [facility] for Heart, Vascular, & Lung Health  Date 11/14/22  Educator EP  Instruction Review Code 1- Verbalizes Understanding       Pulmonary Medications Verbally  interactive group education provided by instructor with focus on inhaled medications and proper administration. Flowsheet Row PULMONARY REHAB CHRONIC OBSTRUCTIVE PULMONARY DISEASE from 08/06/2018 in [facility] for Heart, Vascular, & Lung Health  Date 06/30/18  Educator Victorino Dike  Instruction Review Code 1- Verbalizes Understanding       Anatomy and Physiology of the Respiratory System Group instruction provided by PowerPoint, verbal discussion, and written material to support subject matter. Instructor reviews respiratory cycle and anatomical components of the respiratory  system and their functions. Instructor also reviews differences in obstructive and restrictive respiratory diseases with examples of each.  Flowsheet Row PULMONARY REHAB OTHER RESPIRATORY from 11/21/2022 in [facility] for Heart, Vascular, & Lung Health  Date 11/21/22  Educator RT  Instruction Review Code 1- Verbalizes Understanding       Oxygen Safety Group instruction provided by PowerPoint, verbal discussion, and written material to support subject matter. There is an overview of "What is Oxygen" and "Why do we need it".  Instructor also reviews how to create a safe environment for oxygen use, the importance of using oxygen as prescribed, and the risks of noncompliance. There is a brief discussion on traveling with oxygen and resources the patient may utilize. Flowsheet Row PULMONARY REHAB OTHER RESPIRATORY from 12/26/2022 in [facility] for Heart, Vascular, & Lung Health  Date 12/26/22  Educator RN  Instruction Review Code 1- Verbalizes Understanding       Oxygen Use Group instruction provided by PowerPoint, verbal discussion, and written material to discuss how supplemental oxygen is prescribed and different types of oxygen supply systems. Resources for more information are provided.  Flowsheet Row PULMONARY REHAB OTHER RESPIRATORY from  01/02/2023 in [facility] for Heart, Vascular, & Lung Health  Date 01/02/23  Educator Baird Lyons, RT  Instruction Review Code 1- Verbalizes Understanding       Breathing Techniques Group instruction that is supported by demonstration and informational handouts. Instructor discusses the benefits of pursed lip and diaphragmatic breathing and detailed demonstration on how to perform both.  Flowsheet Row PULMONARY REHAB OTHER RESPIRATORY from 01/09/2023 in [facility] for Heart, Vascular, & Lung Health  Date 01/09/23  Educator RN  Instruction Review Code 1- Verbalizes Understanding        Risk Factor Reduction Group instruction that is supported by a PowerPoint presentation. Instructor discusses the definition of a risk factor, different risk factors for pulmonary disease, and how the heart and lungs work together. Flowsheet Row PULMONARY REHAB OTHER RESPIRATORY from 10/31/2022 in [facility] for Heart, Vascular, & Lung Health  Date 10/31/22  Educator EP  Instruction Review Code 1- Verbalizes Understanding       MD Day A group question and answer session with a medical doctor that allows participants to ask questions that relate to their pulmonary disease state.   Nutrition for the Pulmonary Patient Group instruction provided by PowerPoint slides, verbal discussion, and written materials to support subject matter. The instructor gives an explanation and review of healthy diet recommendations, which includes a discussion on weight management, recommendations for fruit and vegetable consumption, as well as protein, fluid, caffeine, fiber, sodium, sugar, and alcohol. Tips for eating when patients are short of breath are discussed. Flowsheet Row PULMONARY REHAB CHRONIC OBSTRUCTIVE PULMONARY DISEASE from 08/06/2018 in [facility] for Heart, Vascular, & Lung Health  Date 06/18/18  Educator Danne [place]   Instruction Review Code 1- Verbalizes Understanding        Other Education Group or individual verbal, written, or video instructions that support the educational goals of the pulmonary rehab program. Flowsheet Row PULMONARY REHAB OTHER RESPIRATORY from 11/07/2022 in [facility] for Heart, Vascular, & Lung Health  Date 11/07/22  Educator RN  Instruction Review Code 1- Verbalizes Understanding        Knowledge Questionnaire Score:  Knowledge Questionnaire Score - 12/24/22 0827       Knowledge Questionnaire Score  Post Score 16/18             Core Components/Risk Factors/Patient Goals at Admission:  Personal Goals and Risk Factors at Admission - 09/13/22 1019       Core Components/Risk Factors/Patient Goals on Admission    Weight Management Yes;Weight Loss    Admit Weight 188 lb (85.3 kg)    Goal Weight: Short Term 175 lb (79.4 kg)    Expected Outcomes Short Term: Continue to assess and modify interventions until short term weight is achieved;Long Term: Adherence to nutrition and physical activity/exercise program aimed toward attainment of established weight goal;Weight Maintenance: Understanding of the daily nutrition guidelines, which includes 25-35% calories from fat, 7% or less cal from saturated fats, less than 200mg  cholesterol, less than 1.5gm of sodium, & 5 or more servings of fruits and vegetables daily;Weight Loss: Understanding of general recommendations for a balanced deficit meal plan, which promotes 1-2 lb weight loss per week and includes a negative energy balance of [phone] kcal/d;Understanding recommendations for meals to include 15-35% energy as protein, 25-35% energy from fat, 35-60% energy from carbohydrates, less than 200mg  of dietary cholesterol, 20-35 gm of total fiber daily;Understanding of distribution of calorie intake throughout the day with the consumption of 4-5 meals/snacks    Tobacco Cessation Yes    Number of packs per  day 1-3 cigs/day    Intervention Assist the participant in steps to quit. Provide individualized education and counseling about committing to Tobacco Cessation, relapse prevention, and pharmacological support that can be provided by physician.;Education officer, environmental, assist with locating and accessing local/national Quit Smoking programs, and support quit date choice.    Expected Outcomes Short Term: Will demonstrate readiness to quit, by selecting a quit date.;Long Term: Complete abstinence from all tobacco products for at least 12 months from quit date.;Short Term: Will quit all tobacco product use, adhering to prevention of relapse plan.    Improve shortness of breath with ADL's Yes    Intervention Provide education, individualized exercise plan and daily activity instruction to help decrease symptoms of SOB with activities of daily living.    Expected Outcomes Short Term: Improve cardiorespiratory fitness to achieve a reduction of symptoms when performing ADLs;Long Term: Be able to perform more ADLs without symptoms or delay the onset of symptoms    Increase knowledge of respiratory medications and ability to use respiratory devices properly  Yes    Intervention Provide education and demonstration as needed of appropriate use of medications, inhalers, and oxygen therapy.    Expected Outcomes Short Term: Achieves understanding of medications use. Understands that oxygen is a medication prescribed by physician. Demonstrates appropriate use of inhaler and oxygen therapy.;Long Term: Maintain appropriate use of medications, inhalers, and oxygen therapy.    Stress Yes    Intervention Offer individual and/or small group education and counseling on adjustment to heart disease, stress management and health-related lifestyle change. Teach and support self-help strategies.;Refer participants experiencing significant psychosocial distress to appropriate mental health specialists for further evaluation and  treatment. When possible, include family members and significant others in education/counseling sessions.    Expected Outcomes Short Term: Participant demonstrates changes in health-related behavior, relaxation and other stress management skills, ability to obtain effective social support, and compliance with psychotropic medications if prescribed.;Long Term: Emotional wellbeing is indicated by absence of clinically significant psychosocial distress or social isolation.             Core Components/Risk Factors/Patient Goals Review:   Goals and Risk Factor Review  Row Name 09/16/22 4782 10/14/22 1520 11/08/22 1125 12/13/22 0928 01/08/23 1158     Core Components/Risk Factors/Patient Goals Review   Personal Goals Review Weight Management/Obesity;Tobacco Cessation;Improve shortness of breath with ADL's;Develop more efficient breathing techniques such as purse lipped breathing and diaphragmatic breathing and practicing self-pacing with activity.;Increase knowledge of respiratory medications and ability to use respiratory devices properly.;Stress Weight Management/Obesity;Tobacco Cessation;Improve shortness of breath with ADL's;Develop more efficient breathing techniques such as purse lipped breathing and diaphragmatic breathing and practicing self-pacing with activity.;Increase knowledge of respiratory medications and ability to use respiratory devices properly.;Stress Weight Management/Obesity;Improve shortness of breath with ADL's;Stress;Tobacco Cessation Weight Management/Obesity;Improve shortness of breath with ADL's;Stress;Tobacco Cessation Weight Management/Obesity;Improve shortness of breath with ADL's;Stress   Review No changes since orientation visit on 09/13/2022. Onis is scheduled to start his first session on 09/19/2022. Peggy has attended 3 weeks of Pulmonary Rehab. His weight has been stable. He is working with our dietician to lose weight. (See dietitian note). Jhaden is also working toward  smoking cessation. He has cut back on how many cigarettes he smokes daily. He has met his goal on correctly stating when to use his inhaler and has properly demonstrated it with our respiratory therapist. He has also met his goal of developing more efficient breathing techniques and self-pacing. Eudell can initiate pursed lip breathing when he is short of breath without staff involvement. He is still working on improving his shortness of breath but feels like PR is helping. Anthoney is progressing on his goal of weight loss. He is down since starting the program. Goal in progress (see dietician note). He is working on improving his shortness of breath with ADLs to get back to where he previously was. Goal in progress. His oxygen saturation has been stable on 3L while exercising. He has met 2 goals of increasing his knowledge of respiratory medications and the ability to use respiratory devices properly as well as developing more efficient breathing techniques such as purse lipped and diaphragmatic breathing and practicing self-pacing with activity. Waller states he is cutting back on smoking, goal in progress. He is also trying to decrease his stress levels, goal in progress. Caleel states the program isn't helping and is making his breathing worse. Goal progressing for weight loss. Markcus has been working with our dietician for weight loss, his weight is down since starting the program. Goal progressing on improving his shortness of breath with ADLs. Goal met for smoking cessation. Torell has quit smoking. Goal progressing for decreasing stress. Corben's health is improving and decreasing some of his health-related stress. He is still stressed about his finances and the parking situation at his retirement facility. Goal progressing for weight loss. Kaston has been working with our dietician for weight loss, his weight is down since starting the program. Goal progressing on improving his shortness of breath with ADLs. Goal  progressing for decreasing stress. Sumner's health is improving and decreasing some of his health-related stress. He has a positive outlook on life.   Expected Outcomes Unable to evaluate any progress towards his goals yet. Duance will start the program on 09/19/2022. See admission goals See admission goals See admission goals See admission goals            Core Components/Risk Factors/Patient Goals at Discharge (Final Review):   Goals and Risk Factor Review - 01/08/23 1158       Core Components/Risk Factors/Patient Goals Review   Personal Goals Review Weight Management/Obesity;Improve shortness of breath with ADL's;Stress    Review Goal  progressing for weight loss. Mathius has been working with our dietician for weight loss, his weight is down since starting the program. Goal progressing on improving his shortness of breath with ADLs. Goal progressing for decreasing stress. Rune's health is improving and decreasing some of his health-related stress. He has a positive outlook on life.    Expected Outcomes See admission goals             ITP Comments: Pt is making expected progress toward Pulmonary Rehab goals after completing 30 sessions. Recommend continued exercise, life style modification, education, and utilization of breathing techniques to increase stamina and strength, while also decreasing shortness of breath with exertion.    Comments: Dr. Mechele Collin is Medical Director for Pulmonary Rehab at [facility].

## 2023-01-16 ENCOUNTER — Encounter (HOSPITAL_COMMUNITY)
Admission: RE | Admit: 2023-01-16 | Discharge: 2023-01-16 | Disposition: A | Discharge status: Home / Self Care | Payer: No Typology Code available for payment source | Source: Ambulatory Visit | Attending: Internal Medicine | Admitting: Internal Medicine

## 2023-01-16 DIAGNOSIS — G4733 Obstructive sleep apnea (adult) (pediatric): Secondary | ICD-10-CM | POA: Diagnosis not present

## 2023-01-16 NOTE — Progress Notes (Signed)
Daily Session Note  Patient Details  Name: Steve Riley MRN: [id number] Date of Birth: [birth year] Referring Provider:   [name] Pulmonary Rehab Walk Test from 09/13/2022 in [facility]  Referring Provider [name] coverage)       Encounter Date: 01/16/2023  Check In:  Session Check In - 01/16/23 0849       Check-In   Supervising physician immediately available to respond to emergencies [facility] MD immediately available    Physician(s) Joni Reining, NP    Location [facility]    Staff Present Raford Pitcher, MS, ACSM-CEP, Exercise Physiologist;Randi Dionisio Paschal, ACSM-CEP, Exercise Physiologist;Mary Gerre Scull, RN, Fuller Plan, RT    Virtual Visit No    Medication changes reported     No    Fall or balance concerns reported    No    Tobacco Cessation No Change    Warm-up and Cool-down Performed as group-led instruction    Resistance Training Performed Yes    VAD Patient? No    PAD/SET Patient? No      Pain Assessment   Currently in Pain? No/denies    Multiple Pain Sites No             Capillary Blood Glucose: No results found for this or any previous visit (from the past 24 hour(s)).    Social History   Tobacco Use  Smoking Status Former   Current packs/day: 0.00   Average packs/day: 0.3 packs/day for 54.0 years (13.5 ttl pk-yrs)   Types: Cigarettes   Start date: 07/11/1962   Quit date: 07/11/2016   Years since quitting: 6.5  Smokeless Tobacco Never  Tobacco Comments   Pt stopped smoking on Memorial Day weekend 2024. ARJ 11/29/22    Goals Met:  Proper associated with RPD/PD & O2 Sat Independence with exercise equipment Exercise tolerated well No report of concerns or symptoms today Strength training completed today  Goals Unmet:  Not Applicable  Comments: Service time is from 0813 to 0940.    Dr. Mechele Collin is Medical Director for Pulmonary Rehab at [facility].

## 2023-01-21 ENCOUNTER — Encounter (HOSPITAL_COMMUNITY)
Admission: RE | Admit: 2023-01-21 | Discharge: 2023-01-21 | Disposition: A | Discharge status: Home / Self Care | Payer: No Typology Code available for payment source | Source: Ambulatory Visit | Attending: Internal Medicine

## 2023-01-21 VITALS — Wt 171.3 lb

## 2023-01-21 DIAGNOSIS — G4733 Obstructive sleep apnea (adult) (pediatric): Secondary | ICD-10-CM

## 2023-01-21 NOTE — Progress Notes (Signed)
Daily Session Note  Patient Details  Name: Steve Riley MRN: [id number] Date of Birth: [birth year] Referring Provider:   [name] Pulmonary Rehab Walk Test from 09/13/2022 in [facility]  Referring Provider [name] coverage)       Encounter Date: 01/21/2023  Check In:  Session Check In - 01/21/23 0834       Check-In   Supervising physician immediately available to respond to emergencies [facility] MD immediately available    Physician(s) Jari Favre, PA    Location [facility]    Staff Present Raford Pitcher, MS, ACSM-CEP, Exercise Physiologist;Randi Dionisio Paschal, ACSM-CEP, Exercise Physiologist;Mary Gerre Scull, RN, Fuller Plan, RT    Virtual Visit No    Medication changes reported     No    Fall or balance concerns reported    No    Tobacco Cessation No Change    Warm-up and Cool-down Performed as group-led instruction    Resistance Training Performed Yes    VAD Patient? No    PAD/SET Patient? No      Pain Assessment   Currently in Pain? No/denies    Multiple Pain Sites No             Capillary Blood Glucose: No results found for this or any previous visit (from the past 24 hour(s)).    Social History   Tobacco Use  Smoking Status Former   Current packs/day: 0.00   Average packs/day: 0.3 packs/day for 54.0 years (13.5 ttl pk-yrs)   Types: Cigarettes   Start date: 07/11/1962   Quit date: 07/11/2016   Years since quitting: 6.5  Smokeless Tobacco Never  Tobacco Comments   Pt stopped smoking on Memorial Day weekend 2024. ARJ 11/29/22    Goals Met:  Proper associated with RPD/PD & O2 Sat Independence with exercise equipment Exercise tolerated well No report of concerns or symptoms today Strength training completed today  Goals Unmet:  Not Applicable  Comments: Service time is from 0814 to 0925.    Dr. Mechele Collin is Medical Director for Pulmonary Rehab at [facility].

## 2023-01-22 NOTE — Progress Notes (Signed)
01/23/23- 46 yoM former smoker, Veteran,  for sleep evaluation, referred courtesy of [place], with concern of OSA, no prior study. 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 Medical problem list includes COPD, hx Avascular Necrosis L hip/THR, Prurigo Nodularis, Prostate CA,  Home O2 2L Epworth score- Body weight today-171 lbs -----Patient states he feels tired, struggles to fall asleep, and wakes up through the night Put on our O2 while here. Lives alone. Has lost about 20 lbs in recent years.  Prior to Admission medications   Medication Sig Start Date End Date Taking? Authorizing Provider  albuterol (PROVENTIL) (2.5 MG/3ML) 0.083% nebulizer solution Take 3 mLs (2.5 mg total) by nebulization every 6 (six) hours as needed for wheezing or shortness of breath. 09/10/22  Yes Charlott Holler, MD  albuterol (PROVENTIL) (2.5 MG/3ML) 0.083% nebulizer solution Take 3 mLs (2.5 mg total) by nebulization every 6 (six) hours as needed for wheezing or shortness of breath. 09/16/22  Yes Charlott Holler, MD  albuterol (VENTOLIN HFA) 108 (90 Base) MCG/ACT inhaler Inhale 1-2 puffs into the lungs every 6 (six) hours as needed for wheezing. 08/30/22  Yes Charlott Holler, MD  APPLE CIDER VINEGAR PO Take by mouth in the morning and at bedtime.    Yes [provider]  Ascorbic Acid (VITAMIN C WITH ROSE HIPS) 1000 MG tablet Take 1,000 mg by mouth daily.   Yes [provider]  aspirin 81 MG chewable tablet Chew 1 tablet (81 mg total) by mouth 2 (two) times daily. 08/25/19  Yes Kathryne Hitch, MD  budesonide-formoterol [facility]) 160-4.5 MCG/ACT inhaler Inhale 2 puffs into the lungs 2 (two) times daily. 10/16/22  Yes Charlott Holler, MD  Calcium Carbonate-Vitamin D 600-400 MG-UNIT chew tablet Chew 1 tablet by mouth 2 (two) times daily.    Yes [provider]  Cholecalciferol (DIALYVITE VITAMIN D 5000) 125 MCG (5000 UT) capsule Take 5,000 Units by mouth daily.   Yes  [provider]  Cyanocobalamin (B-12) 5000 MCG CAPS Take 5,000 mcg by mouth daily.   Yes [provider]  Flaxseed, Linseed, (FLAX PO) Take 2,600 mg by mouth daily.   Yes [provider]  folic acid (FOLVITE) 1 MG tablet Take 1 tablet by mouth daily. 07/10/21  Yes [provider]  methocarbamol (ROBAXIN) 500 MG tablet Take 1 tablet (500 mg total) by mouth every 6 (six) hours as needed for muscle spasms. 08/25/19  Yes Kathryne Hitch, MD  Methylsulfonylmethane (MSM) 1000 MG CAPS Take 1,000 mg by mouth 2 (two) times daily.   Yes [provider]  ondansetron (ZOFRAN) 4 MG tablet Take 1 tablet (4 mg total) by mouth every 8 (eight) hours as needed for up to 20 doses for nausea or vomiting. 12/08/20  Yes Chandrasekar, Additya, MD  OVER THE COUNTER MEDICATION Take 4,000 Units by mouth in the morning and at bedtime. Serrapeptase   Yes [provider]  oxyCODONE (OXY IR/ROXICODONE) 5 MG immediate release tablet Take 1-2 tablets (5-10 mg total) by mouth every 4 (four) hours as needed for moderate pain (pain score 4-6). 09/01/19  Yes Kathryne Hitch, MD  Probiotic CAPS Take 1 capsule by mouth 2 (two) times daily with a meal.   Yes [provider]  selenium 200 MCG TABS tablet Take 200 mcg by mouth daily.   Yes [provider]  silver sulfADIAZINE (SILVADENE) 1 % cream Apply 1 application  topically in the morning, at noon,  and at bedtime.   Yes [provider]  traZODone (DESYREL) 100 MG tablet Take 100 mg by mouth at bedtime.   Yes [provider]  Turmeric Curcumin 500 MG CAPS Take 500 mg by mouth in the morning and at bedtime.   Yes [provider]  Zinc 50 MG TABS Take 50 mg by mouth daily.   Yes [provider]  benzonatate (TESSALON PERLES) 100 MG capsule Take 1 capsule (100 mg total) by mouth 2 (two) times daily as needed for cough. 10/16/22 10/16/23  Charlott Holler, MD  predniSONE  (DELTASONE) 20 MG tablet Take 2 tablets (40 mg total) by mouth daily with breakfast. 08/28/22   Charlott Holler, MD  Tiotropium Bromide Monohydrate (SPIRIVA RESPIMAT) 2.5 MCG/ACT AERS Inhale 1 puff into the lungs in the morning and at bedtime. 01/23/23   Waymon Budge, MD   Past Medical History:  Diagnosis Date   COPD (chronic obstructive pulmonary disease) (HCC)    Pneumonia    Prostate cancer [facility])    Past Surgical History:  Procedure Laterality Date   BACK SURGERY     HAND SURGERY     PROSTATE BIOPSY     TOTAL HIP ARTHROPLASTY Right 04/06/2019   Procedure: RIGHT TOTAL HIP ARTHROPLASTY ANTERIOR APPROACH;  Surgeon: Kathryne Hitch, MD;  Location: [facility];  Service: Orthopedics;  Laterality: Right;   TOTAL HIP ARTHROPLASTY Left 08/24/2019   TOTAL HIP ARTHROPLASTY Left 08/24/2019   Procedure: LEFT TOTAL HIP ARTHROPLASTY ANTERIOR APPROACH;  Surgeon: Kathryne Hitch, MD;  Location: [facility] OR;  Service: Orthopedics;  Laterality: Left;   Family History  Problem Relation Age of Onset   COPD Mother    COPD Brother    Cancer Neg Hx    Social History   Socioeconomic History   Marital status: Single    Spouse name: Not on file   Number of children: 2   Years of education: Not on file   Highest education level: Associate degree: occupational, Scientist, product/process development, or vocational program  Occupational History   Not on file  Tobacco Use   Smoking status: Former    Current packs/day: 0.00    Average packs/day: 0.3 packs/day for 54.0 years (13.5 ttl pk-yrs)    Types: Cigarettes    Start date: 07/11/1962    Quit date: 07/11/2016    Years since quitting: 6.5   Smokeless tobacco: Never   Tobacco comments:    Pt stopped smoking on Memorial Day weekend 2024. ARJ 11/29/22  Vaping Use   Vaping status: Never Used  Substance and Sexual Activity   Alcohol use: No   Drug use: No   Sexual activity: Not Currently  Other Topics Concern   Not on file  Social History Narrative   Reads Bible,  watches edu TV program, participates in activities at his living facility   Social Determinants of Health   Financial Resource Strain: Not on file  Food Insecurity: No Food Insecurity (01/22/2022)   Received from [facility]   Hunger Vital Sign    Worried About Running Out of Food in the Last Year: Never true    Ran Out of Food in the Last Year: Never true  Transportation Needs: Not on file  Physical Activity: Not on file  Stress: Not on file  Social Connections: Unknown (10/22/2021)   Received from [facility]   Social Network    Social Network: Not on file  Intimate Partner Violence: Unknown (09/13/2021)  Received from [facility], [facility]   HITS    Physically Hurt: Not on file    Insult or Talk Down To: Not on file    Threaten Physical Harm: Not on file    Scream or Curse: Not on file   ROS-see HPI   + = positive Constitutional:    +weight loss, night sweats, fevers, chills, fatigue, lassitude. HEENT:    headaches, difficulty swallowing, tooth/dental problems, sore throat,       +sneezing, itching, ear ache, +nasal congestion, post nasal drip, snoring CV:    chest pain, orthopnea, PND, swelling in lower extremities, anasarca, dizziness, palpitations Resp:   +shortness of breath with exertion or at rest.                +productive cough,   non-productive cough, coughing up of blood.              change in color of mucus.  wheezing.   Skin:    rash or lesions. GI:  No-   heartburn, indigestion, abdominal pain, nausea, vomiting, diarrhea,                 change in bowel habits, loss of appetite GU: dysuria, change in color of urine, no urgency or frequency.   flank pain. MS:   joint pain, stiffness, decreased range of motion, back pain. Neuro-     nothing unusual Psych:  change in mood or affect.  depression or anxiety.   memory loss.  OBJ- Physical Exam   We put him on our O2 tank while here > 93% sat. General- Alert, Oriented,  Affect-appropriate, Distress- none acute Skin-+extensive reticular scaring on arms and few scattered shallow 1cm ulcers, all attributed to prurigo nodularis. Lymphadenopathy- none Head- atraumatic            Eyes- Gross vision intact, PERRLA, conjunctivae and secretions clear            Ears- Hearing, canals-normal            Nose- Clear, no-Septal dev, mucus, polyps, erosion, perforation             Throat- Mallampati III , mucosa clear , drainage- none, tonsils- atrophic, +dentures Neck- flexible , trachea midline, no stridor , thyroid nl, carotid no bruit Chest - symmetrical excursion , unlabored           Heart/CV- RRR , no murmur , no gallop  , no rub, nl s1 s2                           - JVD- none , edema- none, stasis changes- none, varices- none           Lung- +diminished, wheeze+, cough- none , dullness-none, rub- none           Chest wall-  Abd-  Br/ Gen/ Rectal- Not done, not indicated Extrem- cyanosis- none, clubbing, none, atrophy- none, strength- nl Neuro- grossly intact to observation

## 2023-01-23 ENCOUNTER — Encounter (HOSPITAL_COMMUNITY): Admission: RE | Admit: 2023-01-23 | Payer: No Typology Code available for payment source | Source: Ambulatory Visit

## 2023-01-23 ENCOUNTER — Encounter: Payer: Self-pay | Admitting: Internal Medicine

## 2023-01-23 ENCOUNTER — Ambulatory Visit: Payer: No Typology Code available for payment source | Admitting: Internal Medicine

## 2023-01-23 VITALS — BP 126/62 | HR 95 | Ht 68.0 in | Wt 171.0 lb

## 2023-01-23 DIAGNOSIS — J441 Chronic obstructive pulmonary disease with (acute) exacerbation: Secondary | ICD-10-CM

## 2023-01-23 DIAGNOSIS — J4489 Other specified chronic obstructive pulmonary disease: Secondary | ICD-10-CM

## 2023-01-23 DIAGNOSIS — J439 Emphysema, unspecified: Secondary | ICD-10-CM

## 2023-01-23 DIAGNOSIS — J449 Chronic obstructive pulmonary disease, unspecified: Secondary | ICD-10-CM | POA: Diagnosis not present

## 2023-01-23 DIAGNOSIS — R0683 Snoring: Secondary | ICD-10-CM | POA: Diagnosis not present

## 2023-01-23 DIAGNOSIS — F5101 Primary insomnia: Secondary | ICD-10-CM

## 2023-01-23 MED ORDER — SPIRIVA RESPIMAT 2.5 MCG/ACT IN AERS: 1.0000 | INHALATION_SPRAY | Freq: Two times a day (BID) | RESPIRATORY_TRACT | 4 refills | Status: DC

## 2023-01-23 NOTE — Patient Instructions (Addendum)
Order- replacement for broken Flutter valve- blow through 4 times per set, three sets per day, when you feel you need help clearing mucus out of your chest  Script for Spiriva 2.5-  # 3, refill x 4    inhale 2 puffs daily  Order- schedule home sleep test   dx Snoring  Please callus about 2 weeks after your sleep test for results and recommendations

## 2023-01-27 DIAGNOSIS — R0683 Snoring: Secondary | ICD-10-CM | POA: Insufficient documentation

## 2023-01-27 DIAGNOSIS — G4700 Insomnia, unspecified: Secondary | ICD-10-CM | POA: Insufficient documentation

## 2023-01-27 DIAGNOSIS — J449 Chronic obstructive pulmonary disease, unspecified: Secondary | ICD-10-CM | POA: Insufficient documentation

## 2023-01-27 NOTE — Assessment & Plan Note (Signed)
Known severe  COPD Plan- replace broken Flutter valve, refill Spiriva 2.5, continue Symbicort

## 2023-01-27 NOTE — Assessment & Plan Note (Signed)
OSA is questioned Plan- Appropriate discussion.  Sleep study

## 2023-01-27 NOTE — Assessment & Plan Note (Signed)
Difficulty initiating and maintaining sleep. We can reconsider after sleep study is done

## 2023-01-28 ENCOUNTER — Encounter (HOSPITAL_COMMUNITY): Payer: No Typology Code available for payment source

## 2023-01-28 ENCOUNTER — Telehealth (HOSPITAL_COMMUNITY): Payer: Self-pay

## 2023-01-28 NOTE — Telephone Encounter (Signed)
Called pt about missing PR. Pt stated he wants to graduate early. Will submit paperwork that he's completed the program.

## 2023-01-29 NOTE — Progress Notes (Signed)
Discharge Progress Report  Patient Details  Name: OSMIN AKRIDGE MRN: [id number] Date of Birth: 11/13/1950 Referring Provider:   [name] Pulmonary Rehab Walk Test from 09/13/2022 in [facility]  Referring Provider [name] coverage)        Number of Visits: 2  Reason for Discharge:  Patient has met program and personal goals.  Smoking History:  Social History   Tobacco Use  Smoking Status Former   Current packs/day: 0.00   Average packs/day: 0.3 packs/day for 54.0 years (13.5 ttl pk-yrs)   Types: Cigarettes   Start date: 07/11/1962   Quit date: 07/11/2016   Years since quitting: 6.5  Smokeless Tobacco Never  Tobacco Comments   Pt stopped smoking on Memorial Day weekend 2024. ARJ 11/29/22    Diagnosis:  OSA (obstructive sleep apnea)  ADL [facility]:  Pulmonary Assessment Scores     Row Name 09/13/22 1037 12/24/22 0828       ADL [facility]   ADL Phase -- Exit    SOB Score total 58 49      CAT Score   CAT Score 19 11      mMRC Score   mMRC Score 4 4             Initial Exercise Prescription:  Initial Exercise Prescription - 09/13/22 1100       Date of Initial Exercise RX and Referring Provider   Date 09/13/22    Referring Provider [name] coverage)    Expected Discharge Date 12/10/22      Oxygen   Oxygen Intermittent    Liters 2    Maintain Oxygen Saturation 88% or higher      NuStep   Level 1    SPM 60    Minutes 30      Prescription Details   Frequency (times per week) 2    Duration Progress to 30 minutes of continuous aerobic without signs/symptoms of physical distress      Intensity   THRR 40-80% of Max Heartrate 59-118    Ratings of Perceived Exertion 11-13    Perceived Dyspnea 0-4      Progression   Progression Continue to progress workloads to maintain intensity without signs/symptoms of physical distress.      Resistance Training   Training Prescription Yes    Reps  10-15             Discharge Exercise Prescription (Final Exercise Prescription Changes):  Exercise Prescription Changes - 01/21/23 0953       Response to Exercise   Blood Pressure (Admit) 112/58    Blood Pressure (Exit) 124/66    Heart Rate (Admit) 75 bpm    Heart Rate (Exercise) 96 bpm    Heart Rate (Exit) 86 bpm    Oxygen Saturation (Admit) 94 %    Oxygen Saturation (Exercise) 96 %    Oxygen Saturation (Exit) 93 %    Rating of Perceived Exertion (Exercise) 13    Perceived Dyspnea (Exercise) 2    Duration Continue with 30 min of aerobic exercise without signs/symptoms of physical distress.    Intensity THRR unchanged      Progression   Progression Continue to progress workloads to maintain intensity without signs/symptoms of physical distress.      Resistance Training   Training Prescription Yes    Weight blue bands    Reps 10-15    Time 10 Minutes      Oxygen  Oxygen Continuous    Liters 2      NuStep   Level 4    Minutes 15    METs 2.3      Track   Laps 11    Minutes 15    METs 2.69      Oxygen   Maintain Oxygen Saturation 88% or higher             Functional Capacity:  6 Minute Walk     Row Name 09/13/22 1147 12/17/22 0901       6 Minute Walk   Phase Initial Discharge    Distance 544 feet 1515 feet    Distance % Change -- 178.49 %    Distance Feet Change -- 971 ft    Walk Time 6 minutes 6 minutes    # of Rest Breaks 1  5:07-6:00 0    MPH 1.03 2.87    METS 1.5 3.46    RPE 17 11    Perceived Dyspnea  3 1    VO2 Peak 5.24 12.12    Symptoms Yes (comment) No    Comments Pt had to stop and rest at 5 min with accessory muscle use and sats dropping to 79%. O2 was palced @ 2L with sats recovering to 94% after O2 placed. --    Resting HR 69 bpm 88 bpm    Resting BP 140/68 --    Resting Oxygen Saturation  94 % 98 %    Exercise Oxygen Saturation  during 6 min walk 79 % 85 %    Max Ex. HR 96 bpm 112 bpm    Max Ex. BP 154/70 152/80    2  Minute Post BP 138/70 132/70      Interval HR   1 Minute HR 79 104    2 Minute HR 84 107    3 Minute HR 88 111    4 Minute HR 96 110    5 Minute HR 86 112    6 Minute HR 89 111    2 Minute Post HR 77 100    Interval Heart Rate? Yes Yes      Interval Oxygen   Interval Oxygen? -- Yes    Baseline Oxygen Saturation % 94 % 98 %    1 Minute Oxygen Saturation % 94 % 97 %    1 Minute Liters of Oxygen 0 L 2 L    2 Minute Oxygen Saturation % 95 % 93 %  1:48 85%    2 Minute Liters of Oxygen 0 L 2 L  3L    3 Minute Oxygen Saturation % 92 % 88 %    3 Minute Liters of Oxygen 0 L 3 L    4 Minute Oxygen Saturation % 95 % 92 %    4 Minute Liters of Oxygen 0 L 3 L    5 Minute Oxygen Saturation % 79 %  placed pt on 2L 94 %    5 Minute Liters of Oxygen 0 L 3 L    6 Minute Oxygen Saturation % 94 % 95 %    6 Minute Liters of Oxygen 2 L 3 L    2 Minute Post Oxygen Saturation % 96 % 98 %    2 Minute Post Liters of Oxygen 0 L 3 L             Psychological, QOL, Others - Outcomes: PHQ 2/9:    12/24/2022    8:27 AM 09/13/2022  11:36 AM 04/24/2018   12:15 PM 04/24/2018   10:42 AM 07/02/2017    3:27 PM  Depression screen PHQ 2/9  Decreased Interest 0 1 0 1 0  Down, Depressed, Hopeless 0 1 0 1 0  PHQ - 2 Score 0 2 0 2 0  Altered sleeping 0 1 0 0   Tired, decreased energy 0 2 3 3    Change in appetite 0 0 0 0   Feeling bad or failure about yourself  0 0 2 1   Trouble concentrating 0 2 1 1    Moving slowly or fidgety/restless 0 0 0 0   Suicidal thoughts 0 0 0 0   PHQ-9 Score 0 7 6 7    Difficult doing work/chores Not difficult at all Very difficult  Very difficult     Quality of Life:   Personal Goals: Goals established at orientation with interventions provided to work toward goal.  Personal Goals and Risk Factors at Admission - 09/13/22 1019       Core Components/Risk Factors/Patient Goals on Admission    Weight Management Yes;Weight Loss    Admit Weight 188 lb (85.3 kg)    Goal  Weight: Short Term 175 lb (79.4 kg)    Expected Outcomes Short Term: Continue to assess and modify interventions until short term weight is achieved;Long Term: Adherence to nutrition and physical activity/exercise program aimed toward attainment of established weight goal;Weight Maintenance: Understanding of the daily nutrition guidelines, which includes 25-35% calories from fat, 7% or less cal from saturated fats, less than 200mg  cholesterol, less than 1.5gm of sodium, & 5 or more servings of fruits and vegetables daily;Weight Loss: Understanding of general recommendations for a balanced deficit meal plan, which promotes 1-2 lb weight loss per week and includes a negative energy balance of [phone] kcal/d;Understanding recommendations for meals to include 15-35% energy as protein, 25-35% energy from fat, 35-60% energy from carbohydrates, less than 200mg  of dietary cholesterol, 20-35 gm of total fiber daily;Understanding of distribution of calorie intake throughout the day with the consumption of 4-5 meals/snacks    Tobacco Cessation Yes    Number of packs per day 1-3 cigs/day    Intervention Assist the participant in steps to quit. Provide individualized education and counseling about committing to Tobacco Cessation, relapse prevention, and pharmacological support that can be provided by physician.;Education officer, environmental, assist with locating and accessing local/national Quit Smoking programs, and support quit date choice.    Expected Outcomes Short Term: Will demonstrate readiness to quit, by selecting a quit date.;Long Term: Complete abstinence from all tobacco products for at least 12 months from quit date.;Short Term: Will quit all tobacco product use, adhering to prevention of relapse plan.    Improve shortness of breath with ADL's Yes    Intervention Provide education, individualized exercise plan and daily activity instruction to help decrease symptoms of SOB with activities of daily living.     Expected Outcomes Short Term: Improve cardiorespiratory fitness to achieve a reduction of symptoms when performing ADLs;Long Term: Be able to perform more ADLs without symptoms or delay the onset of symptoms    Increase knowledge of respiratory medications and ability to use respiratory devices properly  Yes    Intervention Provide education and demonstration as needed of appropriate use of medications, inhalers, and oxygen therapy.    Expected Outcomes Short Term: Achieves understanding of medications use. Understands that oxygen is a medication prescribed by physician. Demonstrates appropriate use of inhaler and oxygen therapy.;Long Term: Maintain appropriate use  of medications, inhalers, and oxygen therapy.    Stress Yes    Intervention Offer individual and/or small group education and counseling on adjustment to heart disease, stress management and health-related lifestyle change. Teach and support self-help strategies.;Refer participants experiencing significant psychosocial distress to appropriate mental health specialists for further evaluation and treatment. When possible, include family members and significant others in education/counseling sessions.    Expected Outcomes Short Term: Participant demonstrates changes in health-related behavior, relaxation and other stress management skills, ability to obtain effective social support, and compliance with psychotropic medications if prescribed.;Long Term: Emotional wellbeing is indicated by absence of clinically significant psychosocial distress or social isolation.              Personal Goals Discharge:  Goals and Risk Factor Review     Row Name 09/16/22 0951 10/14/22 1520 11/08/22 1125 12/13/22 0928 01/08/23 1158     Core Components/Risk Factors/Patient Goals Review   Personal Goals Review Weight Management/Obesity;Tobacco Cessation;Improve shortness of breath with ADL's;Develop more efficient breathing techniques such as purse lipped  breathing and diaphragmatic breathing and practicing self-pacing with activity.;Increase knowledge of respiratory medications and ability to use respiratory devices properly.;Stress Weight Management/Obesity;Tobacco Cessation;Improve shortness of breath with ADL's;Develop more efficient breathing techniques such as purse lipped breathing and diaphragmatic breathing and practicing self-pacing with activity.;Increase knowledge of respiratory medications and ability to use respiratory devices properly.;Stress Weight Management/Obesity;Improve shortness of breath with ADL's;Stress;Tobacco Cessation Weight Management/Obesity;Improve shortness of breath with ADL's;Stress;Tobacco Cessation Weight Management/Obesity;Improve shortness of breath with ADL's;Stress   Review No changes since orientation visit on 09/13/2022. Darrik is scheduled to start his first session on 09/19/2022. Maddoxx has attended 3 weeks of Pulmonary Rehab. His weight has been stable. He is working with our dietician to lose weight. (See dietitian note). Davieon is also working toward smoking cessation. He has cut back on how many cigarettes he smokes daily. He has met his goal on correctly stating when to use his inhaler and has properly demonstrated it with our respiratory therapist. He has also met his goal of developing more efficient breathing techniques and self-pacing. Lavontay can initiate pursed lip breathing when he is short of breath without staff involvement. He is still working on improving his shortness of breath but feels like PR is helping. Jasai is progressing on his goal of weight loss. He is down since starting the program. Goal in progress (see dietician note). He is working on improving his shortness of breath with ADLs to get back to where he previously was. Goal in progress. His oxygen saturation has been stable on 3L while exercising. He has met 2 goals of increasing his knowledge of respiratory medications and the ability to use  respiratory devices properly as well as developing more efficient breathing techniques such as purse lipped and diaphragmatic breathing and practicing self-pacing with activity. Megan states he is cutting back on smoking, goal in progress. He is also trying to decrease his stress levels, goal in progress. Decklyn states the program isn't helping and is making his breathing worse. Goal progressing for weight loss. Haskel has been working with our dietician for weight loss, his weight is down since starting the program. Goal progressing on improving his shortness of breath with ADLs. Goal met for smoking cessation. Jayon has quit smoking. Goal progressing for decreasing stress. Sagar's health is improving and decreasing some of his health-related stress. He is still stressed about his finances and the parking situation at his retirement facility. Goal progressing for weight loss. Quamir has  been working with our dietician for weight loss, his weight is down since starting the program. Goal progressing on improving his shortness of breath with ADLs. Goal progressing for decreasing stress. Geovani's health is improving and decreasing some of his health-related stress. He has a positive outlook on life.   Expected Outcomes Unable to evaluate any progress towards his goals yet. Duance will start the program on 09/19/2022. See admission goals See admission goals See admission goals See admission goals    Row Name 01/29/23 0906             Core Components/Risk Factors/Patient Goals Review   Personal Goals Review Weight Management/Obesity;Increase knowledge of respiratory medications and ability to use respiratory devices properly.;Tobacco Cessation;Stress;Improve shortness of breath with ADL's;Develop more efficient breathing techniques such as purse lipped breathing and diaphragmatic breathing and practicing self-pacing with activity.       Review Core component goal met for weight loss. Denys has lost 12.3 pounds  during the program with healthy choices and lifestyle modification. He met his goal for tobacco cessation. Goal met for improvement of shortness of breath with ADL's. Ava's shortness of breath score decreased from 58 to 49. Goal met for developing more efficient breathing techniques such as purse lipped breathing and diaphragmatic breathing; and practicing self-pacing with activity.  Goal met for increase knowledge or respiratory medications and ability to use respiratory devices properly.  Goal met for decreased stress. We are proud of the success Khyler has made in the program!       Expected Outcomes To continue to exercise and modify his nutrition and lifestyle post graduation                Exercise Goals and Review:  Exercise Goals     Row Name 09/13/22 1011 09/16/22 0906 10/16/22 1147 11/11/22 1126 12/13/22 1120     Exercise Goals   Increase Physical Activity Yes Yes Yes Yes Yes   Intervention Provide advice, education, support and counseling about physical activity/exercise needs.;Develop an individualized exercise prescription for aerobic and resistive training based on initial evaluation findings, risk stratification, comorbidities and participant's personal goals. Provide advice, education, support and counseling about physical activity/exercise needs.;Develop an individualized exercise prescription for aerobic and resistive training based on initial evaluation findings, risk stratification, comorbidities and participant's personal goals. Provide advice, education, support and counseling about physical activity/exercise needs.;Develop an individualized exercise prescription for aerobic and resistive training based on initial evaluation findings, risk stratification, comorbidities and participant's personal goals. Provide advice, education, support and counseling about physical activity/exercise needs.;Develop an individualized exercise prescription for aerobic and resistive training based  on initial evaluation findings, risk stratification, comorbidities and participant's personal goals. Provide advice, education, support and counseling about physical activity/exercise needs.;Develop an individualized exercise prescription for aerobic and resistive training based on initial evaluation findings, risk stratification, comorbidities and participant's personal goals.   Expected Outcomes Long Term: Add in home exercise to make exercise part of routine and to increase amount of physical activity.;Short Term: Attend rehab on a regular basis to increase amount of physical activity.;Long Term: Exercising regularly at least 3-5 days a week. Long Term: Add in home exercise to make exercise part of routine and to increase amount of physical activity.;Short Term: Attend rehab on a regular basis to increase amount of physical activity.;Long Term: Exercising regularly at least 3-5 days a week. Long Term: Add in home exercise to make exercise part of routine and to increase amount of physical activity.;Short Term: Attend rehab on a  regular basis to increase amount of physical activity.;Long Term: Exercising regularly at least 3-5 days a week. Long Term: Add in home exercise to make exercise part of routine and to increase amount of physical activity.;Short Term: Attend rehab on a regular basis to increase amount of physical activity.;Long Term: Exercising regularly at least 3-5 days a week. Long Term: Add in home exercise to make exercise part of routine and to increase amount of physical activity.;Short Term: Attend rehab on a regular basis to increase amount of physical activity.;Long Term: Exercising regularly at least 3-5 days a week.   Increase Strength and Stamina Yes Yes Yes Yes Yes   Intervention Provide advice, education, support and counseling about physical activity/exercise needs.;Develop an individualized exercise prescription for aerobic and resistive training based on initial evaluation findings,  risk stratification, comorbidities and participant's personal goals. Provide advice, education, support and counseling about physical activity/exercise needs.;Develop an individualized exercise prescription for aerobic and resistive training based on initial evaluation findings, risk stratification, comorbidities and participant's personal goals. Provide advice, education, support and counseling about physical activity/exercise needs.;Develop an individualized exercise prescription for aerobic and resistive training based on initial evaluation findings, risk stratification, comorbidities and participant's personal goals. Provide advice, education, support and counseling about physical activity/exercise needs.;Develop an individualized exercise prescription for aerobic and resistive training based on initial evaluation findings, risk stratification, comorbidities and participant's personal goals. Provide advice, education, support and counseling about physical activity/exercise needs.;Develop an individualized exercise prescription for aerobic and resistive training based on initial evaluation findings, risk stratification, comorbidities and participant's personal goals.   Expected Outcomes Short Term: Increase workloads from initial exercise prescription for resistance, speed, and METs.;Short Term: Perform resistance training exercises routinely during rehab and add in resistance training at home;Long Term: Improve cardiorespiratory fitness, muscular endurance and strength as measured by increased METs and functional capacity ( ) Short Term: Increase workloads from initial exercise prescription for resistance, speed, and METs.;Short Term: Perform resistance training exercises routinely during rehab and add in resistance training at home;Long Term: Improve cardiorespiratory fitness, muscular endurance and strength as measured by increased METs and functional capacity ( ) Short Term: Increase workloads from  initial exercise prescription for resistance, speed, and METs.;Short Term: Perform resistance training exercises routinely during rehab and add in resistance training at home;Long Term: Improve cardiorespiratory fitness, muscular endurance and strength as measured by increased METs and functional capacity ( ) Short Term: Increase workloads from initial exercise prescription for resistance, speed, and METs.;Short Term: Perform resistance training exercises routinely during rehab and add in resistance training at home;Long Term: Improve cardiorespiratory fitness, muscular endurance and strength as measured by increased METs and functional capacity ( ) Short Term: Increase workloads from initial exercise prescription for resistance, speed, and METs.;Short Term: Perform resistance training exercises routinely during rehab and add in resistance training at home;Long Term: Improve cardiorespiratory fitness, muscular endurance and strength as measured by increased METs and functional capacity ( )   Able to understand and use rate of perceived exertion (RPE) scale Yes Yes Yes Yes Yes   Intervention Provide education and explanation on how to use RPE scale Provide education and explanation on how to use RPE scale Provide education and explanation on how to use RPE scale Provide education and explanation on how to use RPE scale Provide education and explanation on how to use RPE scale   Expected Outcomes Short Term: Able to use RPE daily in rehab to express subjective intensity level;Long Term:  Able to use RPE to guide intensity level when exercising  independently Short Term: Able to use RPE daily in rehab to express subjective intensity level;Long Term:  Able to use RPE to guide intensity level when exercising independently Short Term: Able to use RPE daily in rehab to express subjective intensity level;Long Term:  Able to use RPE to guide intensity level when exercising independently Short Term: Able to use RPE  daily in rehab to express subjective intensity level;Long Term:  Able to use RPE to guide intensity level when exercising independently Short Term: Able to use RPE daily in rehab to express subjective intensity level;Long Term:  Able to use RPE to guide intensity level when exercising independently   Able to understand and use Dyspnea scale Yes Yes Yes Yes Yes   Intervention Provide education and explanation on how to use Dyspnea scale Provide education and explanation on how to use Dyspnea scale Provide education and explanation on how to use Dyspnea scale Provide education and explanation on how to use Dyspnea scale Provide education and explanation on how to use Dyspnea scale   Expected Outcomes Short Term: Able to use Dyspnea scale daily in rehab to express subjective sense of shortness of breath during exertion;Long Term: Able to use Dyspnea scale to guide intensity level when exercising independently Short Term: Able to use Dyspnea scale daily in rehab to express subjective sense of shortness of breath during exertion;Long Term: Able to use Dyspnea scale to guide intensity level when exercising independently Short Term: Able to use Dyspnea scale daily in rehab to express subjective sense of shortness of breath during exertion;Long Term: Able to use Dyspnea scale to guide intensity level when exercising independently Short Term: Able to use Dyspnea scale daily in rehab to express subjective sense of shortness of breath during exertion;Long Term: Able to use Dyspnea scale to guide intensity level when exercising independently Short Term: Able to use Dyspnea scale daily in rehab to express subjective sense of shortness of breath during exertion;Long Term: Able to use Dyspnea scale to guide intensity level when exercising independently   Knowledge and understanding of Target Heart Rate Range (THRR) Yes Yes Yes Yes Yes   Intervention Provide education and explanation of THRR including how the numbers were  predicted and where they are located for reference Provide education and explanation of THRR including how the numbers were predicted and where they are located for reference Provide education and explanation of THRR including how the numbers were predicted and where they are located for reference Provide education and explanation of THRR including how the numbers were predicted and where they are located for reference Provide education and explanation of THRR including how the numbers were predicted and where they are located for reference   Expected Outcomes Short Term: Able to state/look up THRR;Short Term: Able to use daily as guideline for intensity in rehab;Long Term: Able to use THRR to govern intensity when exercising independently Short Term: Able to state/look up THRR;Short Term: Able to use daily as guideline for intensity in rehab;Long Term: Able to use THRR to govern intensity when exercising independently Short Term: Able to state/look up THRR;Short Term: Able to use daily as guideline for intensity in rehab;Long Term: Able to use THRR to govern intensity when exercising independently Short Term: Able to state/look up THRR;Short Term: Able to use daily as guideline for intensity in rehab;Long Term: Able to use THRR to govern intensity when exercising independently Short Term: Able to state/look up THRR;Short Term: Able to use daily as guideline for intensity in rehab;Long Term:  Able to use THRR to govern intensity when exercising independently   Understanding of Exercise Prescription Yes Yes Yes Yes Yes   Intervention Provide education, explanation, and written materials on patient's individual exercise prescription Provide education, explanation, and written materials on patient's individual exercise prescription Provide education, explanation, and written materials on patient's individual exercise prescription Provide education, explanation, and written materials on patient's individual exercise  prescription Provide education, explanation, and written materials on patient's individual exercise prescription   Expected Outcomes Short Term: Able to explain program exercise prescription;Long Term: Able to explain home exercise prescription to exercise independently Short Term: Able to explain program exercise prescription;Long Term: Able to explain home exercise prescription to exercise independently Short Term: Able to explain program exercise prescription;Long Term: Able to explain home exercise prescription to exercise independently Short Term: Able to explain program exercise prescription;Long Term: Able to explain home exercise prescription to exercise independently Short Term: Able to explain program exercise prescription;Long Term: Able to explain home exercise prescription to exercise independently    Row Name 01/03/23 0827             Exercise Goals   Increase Physical Activity Yes       Intervention Provide advice, education, support and counseling about physical activity/exercise needs.;Develop an individualized exercise prescription for aerobic and resistive training based on initial evaluation findings, risk stratification, comorbidities and participant's personal goals.       Expected Outcomes Long Term: Add in home exercise to make exercise part of routine and to increase amount of physical activity.;Short Term: Attend rehab on a regular basis to increase amount of physical activity.;Long Term: Exercising regularly at least 3-5 days a week.       Increase Strength and Stamina Yes       Intervention Provide advice, education, support and counseling about physical activity/exercise needs.;Develop an individualized exercise prescription for aerobic and resistive training based on initial evaluation findings, risk stratification, comorbidities and participant's personal goals.       Expected Outcomes Short Term: Increase workloads from initial exercise prescription for resistance,  speed, and METs.;Short Term: Perform resistance training exercises routinely during rehab and add in resistance training at home;Long Term: Improve cardiorespiratory fitness, muscular endurance and strength as measured by increased METs and functional capacity ( )       Able to understand and use rate of perceived exertion (RPE) scale Yes       Intervention Provide education and explanation on how to use RPE scale       Expected Outcomes Short Term: Able to use RPE daily in rehab to express subjective intensity level;Long Term:  Able to use RPE to guide intensity level when exercising independently       Able to understand and use Dyspnea scale Yes       Intervention Provide education and explanation on how to use Dyspnea scale       Expected Outcomes Short Term: Able to use Dyspnea scale daily in rehab to express subjective sense of shortness of breath during exertion;Long Term: Able to use Dyspnea scale to guide intensity level when exercising independently       Knowledge and understanding of Target Heart Rate Range (THRR) Yes       Intervention Provide education and explanation of THRR including how the numbers were predicted and where they are located for reference       Expected Outcomes Short Term: Able to state/look up THRR;Short Term: Able to use daily as guideline for intensity in  rehab;Long Term: Able to use THRR to govern intensity when exercising independently       Understanding of Exercise Prescription Yes       Intervention Provide education, explanation, and written materials on patient's individual exercise prescription       Expected Outcomes Short Term: Able to explain program exercise prescription;Long Term: Able to explain home exercise prescription to exercise independently                Exercise Goals Re-Evaluation:  Exercise Goals Re-Evaluation     Row Name 09/16/22 0906 10/16/22 1147 11/11/22 1126 12/13/22 1120 01/03/23 0827     Exercise Goal Re-Evaluation    Exercise Goals Review Increase Physical Activity;Able to understand and use Dyspnea scale;Understanding of Exercise Prescription;Increase Strength and Stamina;Knowledge and understanding of Target Heart Rate Range (THRR);Able to understand and use rate of perceived exertion (RPE) scale Increase Physical Activity;Able to understand and use Dyspnea scale;Understanding of Exercise Prescription;Increase Strength and Stamina;Knowledge and understanding of Target Heart Rate Range (THRR);Able to understand and use rate of perceived exertion (RPE) scale Increase Physical Activity;Able to understand and use Dyspnea scale;Understanding of Exercise Prescription;Increase Strength and Stamina;Knowledge and understanding of Target Heart Rate Range (THRR);Able to understand and use rate of perceived exertion (RPE) scale Increase Physical Activity;Able to understand and use Dyspnea scale;Understanding of Exercise Prescription;Increase Strength and Stamina;Knowledge and understanding of Target Heart Rate Range (THRR);Able to understand and use rate of perceived exertion (RPE) scale Increase Physical Activity;Able to understand and use Dyspnea scale;Understanding of Exercise Prescription;Increase Strength and Stamina;Knowledge and understanding of Target Heart Rate Range (THRR);Able to understand and use rate of perceived exertion (RPE) scale   Comments Pt is scheduled to start exercise this week. Will continue to monitor and progress as able. Terrin has completed 8 exercise sessions. He exercises for 15 min on the track and Nustep. Pacen averages 2.54 METs on the track and 1.9 METs at level 2 on the Nustep. He performs the warmup and cooldown standing without limitations. Yoltzin has slightly increased his track laps and METs. Alecsander is deconditioning and somewhat difficult to progress. Will continue to monitor and progress able. Legrand has completed 12 exercise sessions. He exercises for 15 min on the track and Nustep. Tin averages  2.54 METs on the track and 2.2 METs at level 2 on the Nustep. He performs the warmup and cooldown standing without limitations. Darion has increased her workload for the Nustep as METs have slightly increased. He is still deconditioned, which is why he has not had much improvement in his METs. Farren seems motivated to exercise as he enjoys class. Will continue to monitor and progress as able. Ara has completed 23 exercise sessions. He exercises for 15 min on the track and Nustep. Braylin averages 2.7 METs on the track and 2.8 METs at level 2 on the Nustep. He performs the warmup and cooldown standing without limitations. Tramon has increased her workload for the Nustep as METs have slightly increased. Amaan is close to graduation as he feels PR has not benefited him much. Rahman has improved functional capacity since starting PR. He is very deconditioned, which is why his improvements have not been tremendously great. Will continue to monitor and progress as able. Dael has completed 28 exercise sessions. He exercises for 15 min on the track and Nustep. Morse averages 2.7 METs on the track and 2.8 METs at level 4 on the Nustep. He performs the warmup and cooldown standing without limitations. Hilery has increased her workload for  the Nustep as METs have remained the same. Adeoluwa has extended his time to help increase his progress. He does not feel rehab has made a significant difference although his numbers have showed improvement. He has also starting walking at home. He is not walking consistently but is encouraged to. Will continue to monitor and progress as able.   Expected Outcomes Through exercise at rehab and home, the patient will decrease shortness of breath with daily activities and feel confident in carrying out an exercise regimen at home. Through exercise at rehab and home, the patient will decrease shortness of breath with daily activities and feel confident in carrying out an exercise regimen at home.  Through exercise at rehab and home, the patient will decrease shortness of breath with daily activities and feel confident in carrying out an exercise regimen at home. Through exercise at rehab and home, the patient will decrease shortness of breath with daily activities and feel confident in carrying out an exercise regimen at home. Through exercise at rehab and home, the patient will decrease shortness of breath with daily activities and feel confident in carrying out an exercise regimen at home.            Nutrition & Weight - Outcomes:  Pre Biometrics - 09/13/22 0955       Pre Biometrics   Grip Strength 40 kg              Nutrition:  Nutrition Therapy & Goals - 01/07/23 1347       Nutrition Therapy   Diet Heart  Healthy Diet      Personal Nutrition Goals   Nutrition Goal Patient to improve diet quality by using the plate method as a guide for meal planning to include lean protein/plant protein, fruits, vegetables, whole grains, nonfat dairy as part of a well-balanced diet.    Comments Goals in progress. Kashmere reports stable appetite and standard eating patterns. He lives alone in an independent living community and does his own grocery shopping and cooking. Hong has newly quit smoking. He is down ~12.3# since starting with our program; BMI remains appropriate for age. His doctor has recommended starting a statin medication but patient declines. Stressed the importance of increasing dietary fiber and reducing saturated fat intake. Ra will benefit from participation in pulmonary rehab for nutrition and exercise support.      Intervention Plan   Intervention Prescribe, educate and counsel regarding individualized specific dietary modifications aiming towards targeted core components such as weight, hypertension, lipid management, diabetes, heart failure and other comorbidities.;Nutrition handout(s) given to patient.    Expected Outcomes Short Term Goal: Understand basic  principles of dietary content, such as calories, fat, sodium, cholesterol and nutrients.;Long Term Goal: Adherence to prescribed nutrition plan.             Nutrition Discharge:  Nutrition Assessments - 12/24/22 1136       Rate Your Plate Scores   Post Score 65             Education Questionnaire Score:  Knowledge Questionnaire Score - 12/24/22 0827       Knowledge Questionnaire Score   Post Score 16/18             Goals reviewed with patient; copy given to patient.

## 2023-01-30 ENCOUNTER — Encounter (HOSPITAL_COMMUNITY): Payer: No Typology Code available for payment source

## 2023-01-31 ENCOUNTER — Ambulatory Visit: Payer: No Typology Code available for payment source

## 2023-01-31 DIAGNOSIS — R0683 Snoring: Secondary | ICD-10-CM | POA: Diagnosis not present

## 2023-02-05 ENCOUNTER — Telehealth (HOSPITAL_COMMUNITY): Payer: Self-pay

## 2023-02-05 NOTE — Telephone Encounter (Signed)
Returned call to patient. Asking for resistant band. Will place one at the front office for him.

## 2023-02-06 DIAGNOSIS — R0683 Snoring: Secondary | ICD-10-CM | POA: Diagnosis not present

## 2023-02-14 ENCOUNTER — Telehealth: Payer: Self-pay | Admitting: Internal Medicine

## 2023-02-14 ENCOUNTER — Telehealth (HOSPITAL_COMMUNITY): Payer: Self-pay

## 2023-02-14 NOTE — Telephone Encounter (Signed)
Pt called the Pulmonary Rehab office asking about Dupixent. Advised him to talk to his pulmonologist about the medication.

## 2023-02-14 NOTE — Telephone Encounter (Signed)
Pt. Called through answering service and wants to go over his HST results please advise

## 2023-02-17 NOTE — Telephone Encounter (Signed)
Sleep study showed no sleep apnea. However oxygen level was low. This may be contributing to sleep problems. Recommend recheck by ordering overnight oximetry. If this confirms, then Dr Celine Mans can order home oxygen to wear at night while sleeping, and manage as part of chronic lung disease. He had seen her in June originally.

## 2023-02-18 NOTE — Telephone Encounter (Signed)
Patient would like results of home sleep study. Patient phone number is [phone].

## 2023-02-19 NOTE — Telephone Encounter (Signed)
Results have been relayed to the patient/authorized caretaker. The patient/authorized caretaker verbalized understanding. He would like to proceed with overnight oxymetry. He has an appt with you 10/3. He would like to know if you think Dupixent would help him. He has been reading about it.

## 2023-02-19 NOTE — Telephone Encounter (Signed)
Hopefully we can get the results of overnight oximetry on room air available by the time of his return appointment, October 3 I will be happy to discuss his breathing symptoms and any role for a "Biologic" like Dupixent when I see him again.

## 2023-02-19 NOTE — Telephone Encounter (Signed)
Called patient to go over but he was in the middle of a fire drill where he lives. I will call back in a few hours.

## 2023-02-20 NOTE — Telephone Encounter (Signed)
[facility] to [facility].  This ONO order  was sent to the [place] on 11/29/22 by Cordelia Pen.

## 2023-02-21 NOTE — Telephone Encounter (Signed)
I Have called and spoke with pt, advised about ono with the [place]. Pt verbalized understanding, nfn

## 2023-02-24 ENCOUNTER — Telehealth: Payer: Self-pay | Admitting: Internal Medicine

## 2023-02-24 NOTE — Telephone Encounter (Signed)
Patient is calling back about an ONO from [place]. The sleep study results need to be sent to [phone]

## 2023-03-05 NOTE — Telephone Encounter (Signed)
Spoke with patient who was very confused about what was supposed to happen. Went over results of HST and refaxed sleep study results to [place]. Informed patient that he will need to f[facility] with [facility] about getting test [facility] and that order has been faxed.

## 2023-03-12 NOTE — Progress Notes (Unsigned)
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 Medical problem list includes COPD, hx Avascular Necrosis L hip/THR, Prurigo Nodularis, Prostate CA,  Home O2 2L Epworth score- Body weight today-171 lbs -----Patient states he feels tired, struggles to fall asleep, and wakes up through the night Put on our O2 while here. Lives alone. Has lost about 20 lbs in recent years.  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 Sleep apnea ruled out, but has severe COPD and Has not been sleeping with O2, but instructed to restart. We discussed possible candidacy for Dupixent for his COPD.  ROS-see HPI   + = positive Constitutional:    +weight loss, night sweats, fevers, chills, fatigue, lassitude. HEENT:    headaches, difficulty swallowing, tooth/dental problems, sore throat,       +sneezing, itching, ear ache, +nasal congestion, post nasal drip, snoring CV:    chest pain, orthopnea,  PND, swelling in lower extremities, anasarca, dizziness, palpitations Resp:   +shortness of breath whith exertion or at rest.                +productive cough,   non-productive cough, coughing up of blood.              change in color of mucus.  wheezing.   Skin:    rash or lesions. GI:  No-   heartburn, indigestion, abdominal pain, nausea, vomiting, diarrhea,                 change in bowel habits, loss of appetite GU: dysuria, change in color of urine, no urgency or frequency.   flank pain. MS:   joint pain, stiffness, decreased range of motion, back pain. Neuro-     nothing unusual Psych:  change in mood or affect.  depression or anxiety.   memory loss.  OBJ- Physical Exam   General- Alert, Oriented, Affect-appropriate, Distress- none acute Skin-+extensive reticular scaring on arms and few scattered shallow 1cm ulcers, all attributed to prurigo nodularis. Lymphadenopathy- none Head- atraumatic            Eyes- Gross vision intact, PERRLA, conjunctivae and secretions clear            Ears- Hearing, canals-normal            Nose- Clear, no-Septal dev, mucus, polyps, erosion, perforation             Throat- Mallampati III , mucosa clear , drainage- none, tonsils- atrophic, +dentures Neck- flexible , trachea midline, no stridor , thyroid nl, carotid no bruit Chest - symmetrical excursion , unlabored  Heart/CV- RRR , no murmur , no gallop  , no rub, nl s1 s2                           - JVD- none , edema- none, stasis changes- none, varices- none           Lung- +diminished/ few rattles, wheeze-none, cough- none , dullness-none, rub- none           Chest wall-  Abd-  Br/ Gen/ Rectal- Not done, not indicated Extrem- cyanosis- none, clubbing, none, atrophy- none, strength- nl Neuro- grossly intact to observation

## 2023-03-13 ENCOUNTER — Encounter: Payer: Self-pay | Admitting: Internal Medicine

## 2023-03-13 ENCOUNTER — Ambulatory Visit (INDEPENDENT_AMBULATORY_CARE_PROVIDER_SITE_OTHER): Payer: No Typology Code available for payment source | Admitting: Internal Medicine

## 2023-03-13 VITALS — BP 122/76 | HR 102 | Ht 68.0 in | Wt 159.6 lb

## 2023-03-13 DIAGNOSIS — J449 Chronic obstructive pulmonary disease, unspecified: Secondary | ICD-10-CM

## 2023-03-13 DIAGNOSIS — J9611 Chronic respiratory failure with hypoxia: Secondary | ICD-10-CM

## 2023-03-13 DIAGNOSIS — R0683 Snoring: Secondary | ICD-10-CM

## 2023-03-13 NOTE — Patient Instructions (Addendum)
Wear your O2 2-3L during sleep every night, and as needed during the day.  Continue your current breathing meds  Order- Application for Dupixent    dx Severe Asthma COPD overlap

## 2023-03-28 DIAGNOSIS — J9611 Chronic respiratory failure with hypoxia: Secondary | ICD-10-CM | POA: Insufficient documentation

## 2023-03-28 NOTE — Assessment & Plan Note (Signed)
Has nocturnal hypoxemia, but not OSA at this time.

## 2023-03-28 NOTE — Assessment & Plan Note (Signed)
Add Dupixent for trial Consider if candidate to try Otuvayre neb

## 2023-03-28 NOTE — Assessment & Plan Note (Signed)
Continue O2 2L for sleep

## 2023-03-31 ENCOUNTER — Telehealth: Payer: Self-pay | Admitting: Pharmacist

## 2023-03-31 DIAGNOSIS — J4489 Other specified chronic obstructive pulmonary disease: Secondary | ICD-10-CM

## 2023-03-31 DIAGNOSIS — J449 Chronic obstructive pulmonary disease, unspecified: Secondary | ICD-10-CM

## 2023-03-31 MED ORDER — DUPIXENT 300 MG/2ML ~~LOC~~ SOAJ
SUBCUTANEOUS | 0 refills | Status: DC
Start: 2023-03-31 — End: 2023-03-31

## 2023-03-31 MED ORDER — DUPIXENT 300 MG/2ML ~~LOC~~ SOAJ
SUBCUTANEOUS | 0 refills | Status: DC
Start: 2023-03-31 — End: 2023-06-16

## 2023-03-31 NOTE — Telephone Encounter (Signed)
Received email from [place], PharmD at [facility]. Patient must fill w [facility]  Phone: [phone]  Fax: [phone]   Chesley Mires, PharmD, MPH, BCPS, CPP Clinical Pharmacist (Rheumatology and Pulmonology)

## 2023-03-31 NOTE — Telephone Encounter (Signed)
Received new start paperwork for Dupixent. Patient appears to be connected to [facility] through [facility]. Rx sent via escribe to [facility]  Clinicals sent via fax Fax: [phone] Phone: [phone] . Spoke with patient and provided him with my direct phone number to call once he receives shipment as well as pharmacy's phone number if he has not heard from them by next week. Will f/u  Chesley Mires, PharmD, MPH, BCPS, CPP Clinical Pharmacist (Rheumatology and Pulmonology)

## 2023-04-08 NOTE — Telephone Encounter (Signed)
Received call from patient regarding Dupixent. States he has not heard back or received shipment. Provided him w [facility] phone number to schedule shipment tomorrow. He'll call me back with any issues  Chesley Mires, PharmD, MPH, BCPS, CPP Clinical Pharmacist (Rheumatology and Pulmonology)

## 2023-04-09 ENCOUNTER — Telehealth: Payer: Self-pay | Admitting: Internal Medicine

## 2023-04-09 NOTE — Telephone Encounter (Signed)
Patient is on Symbicort but pharmacy wants to try him on Dulera 200.Please call and advise.

## 2023-04-11 ENCOUNTER — Other Ambulatory Visit (HOSPITAL_COMMUNITY): Payer: Self-pay

## 2023-04-11 NOTE — Telephone Encounter (Signed)
Test claims show that a prior authorization is not required.  Generic Symbicort - $0.00 Brand Symbicort - $0.00 Breyna - $0.00

## 2023-04-14 NOTE — Telephone Encounter (Addendum)
Received call from patient on 04/10/23 with a VM stating [facility] has denied Dupixent for asthma/COPD due to blood work not showing eosinophil > 300  Patient may be candidate for [facility] for COPD. Left VM for patient regarding this. Routing to Dr. Maple Hudson.  ATC patient regarding Chesley Mires, PharmD, MPH, BCPS, CPP Clinical Pharmacist (Rheumatology and Pulmonology)

## 2023-04-21 NOTE — Telephone Encounter (Signed)
Pt calling to touch basis about his medication, he is having a hard time breathing and will like to get to the bottom of the situation.

## 2023-04-24 ENCOUNTER — Telehealth: Payer: Self-pay | Admitting: Internal Medicine

## 2023-04-24 MED ORDER — ENSIFENTRINE 3 MG/2.5ML IN SUSP: 1.0000 | Freq: Two times a day (BID) | RESPIRATORY_TRACT | 5 refills | Status: DC

## 2023-04-24 NOTE — Telephone Encounter (Signed)
He has a compressor nebulizer already.  Kendal Hymen- please start order for Otuhvayre neb solution, 1 amp twice daily, # 60, refill x 5.      Dx COPD mixed type, Cchronic respiratory failure with hypoxia

## 2023-04-24 NOTE — Telephone Encounter (Signed)
ATC X1 LVM for patent to call back our office . I have sent in Otuhvayre neb solution to [place] in [place].

## 2023-04-24 NOTE — Telephone Encounter (Signed)
Pt missed call.

## 2023-04-28 ENCOUNTER — Telehealth: Payer: Self-pay | Admitting: Internal Medicine

## 2023-04-28 NOTE — Telephone Encounter (Signed)
[facility] calling about RX for Ensifentrine submitted Fri. They do not carry this but  can they sub with Roflumilast tablets. If Dr. Stann Mainland approve they need a new RX.   [phone] [facility]

## 2023-04-28 NOTE — Telephone Encounter (Signed)
Patient is trying to return a missed call. It may be in regards to an inhaler.

## 2023-04-29 NOTE — Telephone Encounter (Signed)
Spoke with patient - apparently [facility] is not contracted to fill Ohtuvayre. I've sent an email to [facility], our drug rep for clarification on how a [facility] patient can acquire Ohtuvayre.  Advised we will reah out to him with update once received  Chesley Mires, PharmD, MPH, BCPS, CPP Clinical Pharmacist (Rheumatology and Pulmonology)

## 2023-04-29 NOTE — Telephone Encounter (Signed)
Patient is trying to return missed call to [facility] about an inhaler.

## 2023-04-30 NOTE — Telephone Encounter (Signed)
Received response from [place], rep for Ohtuvayre: For [facility] patients, the completed form is forwarded to the local [facility].  The [facility] Pharm[facility] will complete their end and then send the referral to our [facility].  From there, [facility] will be the dispensing pharmacy.  Please let me know when you send the referral so that I can follow it.  Form completed and placed on Dr. Roxy Cedar desk in [place] for signature.  Chesley Mires, PharmD, MPH, BCPS, CPP Clinical Pharmacist (Rheumatology and Pulmonology)

## 2023-05-01 NOTE — Telephone Encounter (Signed)
PT ret our call possibly. I explained all of the below. He expressed understanding.

## 2023-05-01 NOTE — Telephone Encounter (Signed)
[facility] calling in to see if patiest ever tried daliresp  Ext [phone]

## 2023-05-01 NOTE — Telephone Encounter (Signed)
LMTCB x 1 

## 2023-05-05 NOTE — Telephone Encounter (Signed)
Called pt and there was no answer- unable to leave msg Closing per protocol

## 2023-05-05 NOTE — Telephone Encounter (Signed)
Ohtuvayre application has been faxed to [employer]. Will wait for any potential f/u.

## 2023-05-06 DIAGNOSIS — D126 Benign neoplasm of colon, unspecified: Secondary | ICD-10-CM | POA: Insufficient documentation

## 2023-05-06 DIAGNOSIS — F3181 Bipolar II disorder: Secondary | ICD-10-CM | POA: Insufficient documentation

## 2023-05-06 DIAGNOSIS — C61 Malignant neoplasm of prostate: Secondary | ICD-10-CM | POA: Insufficient documentation

## 2023-05-06 DIAGNOSIS — N529 Male erectile dysfunction, unspecified: Secondary | ICD-10-CM | POA: Insufficient documentation

## 2023-05-06 DIAGNOSIS — Z720 Tobacco use: Secondary | ICD-10-CM | POA: Insufficient documentation

## 2023-05-06 DIAGNOSIS — J181 Lobar pneumonia, unspecified organism: Secondary | ICD-10-CM | POA: Insufficient documentation

## 2023-05-06 DIAGNOSIS — K8020 Calculus of gallbladder without cholecystitis without obstruction: Secondary | ICD-10-CM | POA: Insufficient documentation

## 2023-05-06 DIAGNOSIS — E669 Obesity, unspecified: Secondary | ICD-10-CM | POA: Insufficient documentation

## 2023-05-06 DIAGNOSIS — K5901 Slow transit constipation: Secondary | ICD-10-CM | POA: Insufficient documentation

## 2023-05-06 DIAGNOSIS — I509 Heart failure, unspecified: Secondary | ICD-10-CM | POA: Insufficient documentation

## 2023-05-06 NOTE — Telephone Encounter (Signed)
[facility] calling again informing that Steve Riley is denied due to patient having to try daliresp first. Please advise. Call back number is [phone] ext [phone]

## 2023-05-06 NOTE — Telephone Encounter (Signed)
[facility]oke with [facility] and advised them patient not a candidate for Daliresp due to history of GI issues (GERD), he would get better clinical management of his COPD with an inhaled medication. She is updating the request with this information and will forward to the pharmacy team for review. They will contact us if they need any more information.

## 2023-05-06 NOTE — Telephone Encounter (Signed)
See additional encounter dated 04/28/23.

## 2023-05-30 NOTE — Telephone Encounter (Signed)
Received fax from [place] dated 05/14/2023.  Steve Riley is not approved for both of the following clinical reasons: - must have had at least 2 moderate COPD exacerbations or at least 1 severe COPD exacerbation (requiring hospitalization) in previous 12 months. Notes indicate on exacerbation in March 2024 - Adherence to COPD medications as evidence by rx fill history. Fill history shows patient has not filled Symbicort since May 2024  Chesley Mires, PharmD, MPH, BCPS, CPP Clinical Pharmacist (Rheumatology and Pulmonology)

## 2023-06-14 NOTE — Progress Notes (Signed)
 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 Sleep apnea ruled out, but has severe COPD and Has not been sleeping with O2, but instructed to restart. We discussed possible candidacy for Dupixent  for his COPD.  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    [place] Dupixent  due to eosinophils not elevated. Ohtuvayre  is not approved for both of the following clinical reasons: - must have had at least 2 moderate COPD exacerbations or at least 1 severe COPD exacerbation (requiring hospitalization) in previous 12 months. Notes indicate on exacerbation in March 2024 - Adherence to COPD medications as evidence by rx fill  history. Fill history shows patient has not filled Symbicort  since May 2024 Sherry Pennant, PharmD, MPH, BCPS, CPP  Discussed the use of AI scribe software for clinical note transcription with the patient, who gave verbal consent to proceed.  History of Present Illness   The patient, with a history of COPD, presents with worsening breathing issues. He reports using a nebulizer with albuterol  sulfate every 6 hours, along with Symbicort  and Spiriva . Despite these treatments, the patient's breathing worsens at night and in the late afternoon, requiring the use of supplemental oxygen. The patient describes waking up unable to breathe and needing to use oxygen for a couple of hours in the morning. By the late afternoon, around six or six-thirty, the patient needs to start using the oxygen again as his breathing worsens. [facility] did not approve Dupixent  or Otuvayre. Dry cough.  In addition to his respiratory issues, the patient also reports having sores on his leg and a skin condition called prurigo nodularis. The patient has been treating the sores with cold-pressed virgin olive oil, which he reports has sped up the healing process. The patient also mentions that he is a light smoker still- smokes three to four cigarettes a day. He expresses a desire to quit smoking and has previously tried ?bupropion ?.     CXR 09/05/22- IMPRESSION: COPD changes without acute abnormalities. Emphysema (ICD10-J43.9).  ROS-see HPI   + = positive Constitutional:    +weight loss, night sweats, fevers, chills, fatigue, lassitude. HEENT:    headaches, difficulty swallowing, tooth/dental problems, sore throat,       +sneezing, itching, ear ache, +nasal congestion, post nasal drip, snoring CV:    chest pain, orthopnea, PND, swelling in lower extremities, anasarca, dizziness, palpitations Resp:   +shortness of breath whith exertion or  at rest.                +productive cough,   non-productive cough, coughing up of blood.               change in color of mucus.  wheezing.   Skin:    rash or lesions. GI:  No-   heartburn, indigestion, abdominal pain, nausea, vomiting, diarrhea,                 change in bowel habits, loss of appetite GU: dysuria, change in color of urine, no urgency or frequency.   flank pain. MS:   joint pain, stiffness, decreased range of motion, back pain. Neuro-     nothing unusual Psych:  change in mood or affect.  depression or anxiety.   memory loss.  OBJ- Physical Exam   General- Alert, Oriented, Affect-appropriate, Distress- none acute Skin-+extensive reticular scaring on arms and few scattered shallow 1cm ulcers, all attributed to prurigo nodularis. Lymphadenopathy- none Head- atraumatic            Eyes- Gross vision intact, PERRLA, conjunctivae and secretions clear            Ears- +Hearing aids            Nose- Clear, no-Septal dev, mucus, polyps, erosion, perforation             Throat- Mallampati III , mucosa clear , drainage- none, tonsils- atrophic, +dentures Neck- flexible , trachea midline, no stridor , thyroid nl, carotid no bruit Chest - symmetrical excursion , unlabored           Heart/CV- RRR , no murmur , no gallop  , no rub, nl s1 s2                           - JVD- none , edema- none, stasis changes- none, varices- none           Lung- +diminished, wheeze-none, cough- none , dullness-none, rub- none           Chest wall-  Abd-  Br/ Gen/ Rectal- Not done, not indicated Extrem- cyanosis- none, clubbing, none, atrophy- none, strength- nl Neuro- grossly intact to observation  Assessment and Plan    Chronic Obstructive Pulmonary Disease (COPD) Worsening dyspnea, especially at night and in the evening. Currently on Albuterol  sulfate via nebulizer, Symbicort , and Spiriva . [place] denied approval for Ohtuvayre  and Dupixent  due to lack of recent moderate to severe exacerbations. Discussed the addition of Duoneb to nebulizer regimen. -Order Duoneb for nebulizer use, send prescription to  [facility] pharmacy.  Oxygen Use Patient uses oxygen intermittently, especially at night and in the evening. Reports discomfort with current nasal cannula. -Order a BiFlow nasal cup mask from [facility] for patient to trial for comfort.  Tobacco Use Patient reports smoking 3-4 cigarettes per day, expresses desire to quit. Previously tried bupropion  temporary success. -Encourage continued efforts to quit smoking.  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 Patient reports some relief with use of cold pressed virgin olive oil. -Continue current skin care regimen as it seems to be providing some relief.  Follow-up Monitor response to new nebulizer medication and comfort with new oxygen mask. Continue to support smoking cessation efforts.

## 2023-06-16 ENCOUNTER — Encounter: Payer: Self-pay | Admitting: Internal Medicine

## 2023-06-16 ENCOUNTER — Ambulatory Visit: Payer: No Typology Code available for payment source | Admitting: Internal Medicine

## 2023-06-16 VITALS — BP 120/70 | HR 76 | Ht 68.5 in | Wt 155.0 lb

## 2023-06-16 DIAGNOSIS — J441 Chronic obstructive pulmonary disease with (acute) exacerbation: Secondary | ICD-10-CM

## 2023-06-16 DIAGNOSIS — J181 Lobar pneumonia, unspecified organism: Secondary | ICD-10-CM

## 2023-06-16 DIAGNOSIS — J9611 Chronic respiratory failure with hypoxia: Secondary | ICD-10-CM | POA: Diagnosis not present

## 2023-06-16 DIAGNOSIS — J449 Chronic obstructive pulmonary disease, unspecified: Secondary | ICD-10-CM

## 2023-06-16 MED ORDER — IPRATROPIUM-ALBUTEROL 0.5-2.5 (3) MG/3ML IN SOLN: RESPIRATORY_TRACT | 12 refills | Status: DC

## 2023-06-16 NOTE — Patient Instructions (Signed)
 Script printed for Duoneb nebulizer solution- 1 ampule nebulized every 6 hours as needed. Try this instead of albuterol  Order- [facility] (in [place]) - order BiFlow nasal cup mask for use with oxygen instead of nasal prongs.

## 2023-10-30 ENCOUNTER — Ambulatory Visit: Payer: Self-pay | Admitting: Internal Medicine

## 2023-10-30 NOTE — Telephone Encounter (Signed)
 Copied from CRM [id number]. Topic: Clinical - Red Word Triage >> Oct 30, 2023 12:57 PM Justina Oman C wrote: Red Word that prompted transfer to Nurse Triage: Patient [phone] states Dr. Linder Revere prescribed Duoneb nebulizer solution- 1 ampule nebulized every 6 hours as needed is not working well and cut patient short making it more shortness of breath and wheezing in between the 6 hours before taking the next dosage. Patient wants to go back to Albuterol  (PROVENTIL ) (2.5 MG/3ML) 0.083% nebulizer solution that Dr. Dione Franks prescribed. Patient is not dizzy or wheezing right now but having shortness of breath, coughing phlegm. Please advise.   [facility] - [place], [place] -[address] [place] [address] Phone: [phone] Fax: [phone]  TRIAGE SUMMARY NOTE: Pt reporting that he has been feeling more SOB than normal pretty steadily throughout the past week or two "since been taking this" new prescription duoneb nebulizer. Pt reporting intermittent wheezing, not wheezing at present, just took duoneb at 12 pm but reporting he will be wheezing before he's due for the next dose. Pt confirms no chest pain, dizziness, weakness, or fever, pt is speaking in full sentences. Pt pulse ox is 92% off oxygen right now but been waking up with oxygen levels at 87-88% before doing his meds. Pt confirms [facility], [facility] confirmed that advice would be for pt to be examined today, since seen by PCP, advised that sending HP message to pulm office to see about further recommendations, including pt's request for the albuterol  nebulizer instead of duoneb because he felt it was more effective. Advised pt call back if any worsening symptoms. Please advise and call pt back.  E2C2 Pulmonary Triage - Initial Assessment Questions "Chief Complaint (e.g., cough, sob, wheezing, fever, chills, sweat or additional symptoms) *Go to specific symptom protocol after initial questions. More SOB  than normal, pretty steady Not wheezing right now because just took duoneb at 12 pm but don't take it again until 6 pm, start wheezing again before then Coughing a lot, sometimes cough up phlegm not sure color can't spit that shit up No chest pain, dizziness, weakness, fever Speaking in full sentences  "How long have symptoms been present?" Since been taking new script, a week or 2  "Have you used your inhalers/maintenance medication?" Yes If yes, "What medications?" Duoneb using every 6 hours, not been using rescue inhaler was told not to by [facility] Spiriva  Symbicort  Been taking them back to back, 15 min in between medications  OXYGEN: "Do you wear supplemental oxygen?" Yes If yes, "How many liters are you supposed to use?" Wear that damn oxygen to bed with me, sometimes take it off at night and back on 2L, was told he could push it up to 2.5L at times, 2.5L at night  "Do you monitor your oxygen levels?" Yes If yes, "What is your reading (oxygen level) today?" 87-88%, gets up 90-91% after meds in morning 92% off of oxygen right now  Reason for Disposition  [1] Caller has URGENT medicine question about med that PCP or specialist prescribed AND [2] triager unable to answer question  Answer Assessment - Initial Assessment Questions 1. NAME of MEDICINE: "What medicine(s) are you calling about?"     Duoneb nebulizer 2. QUESTION: "What is your question?" (e.g., double dose of medicine, side effect)     Requesting to change back to albuterol  nebulizer since he feels it covers his SOB better than duoneb has been 3. PRESCRIBER: "Who  prescribed the medicine?" Reason: if prescribed by specialist, call should be referred to that group.     Dr. Linder Revere 4. SYMPTOMS: "Do you have any symptoms?" If Yes, ask: "What symptoms are you having?"  "How bad are the symptoms (e.g., mild, moderate, severe)     More SOB than usual with intermittent wheezing and productive cough, when asked if pt having  little more or lot more SOB than normal, pt responded "more."  Protocols used: Medication Question Call-A-AH

## 2023-10-30 NOTE — Telephone Encounter (Signed)
 Ok to d/c Duoneb and instead Rx albuterol  neb solution 0.083%, # 360 ampules, 1 evey 4-6 hours as needed,    refill x 5

## 2023-10-30 NOTE — Telephone Encounter (Signed)
**Note De-identified  [place] Obfuscation** Please advise 

## 2023-10-31 MED ORDER — ALBUTEROL SULFATE (2.5 MG/3ML) 0.083% IN NEBU: 2.5000 mg | INHALATION_SOLUTION | RESPIRATORY_TRACT | 5 refills | Status: DC | PRN

## 2023-10-31 MED ORDER — ALBUTEROL SULFATE (2.5 MG/3ML) 0.083% IN NEBU: 2.5000 mg | INHALATION_SOLUTION | Freq: Four times a day (QID) | RESPIRATORY_TRACT | 5 refills | Status: DC | PRN

## 2023-10-31 NOTE — Addendum Note (Signed)
 Addended byGregory Leash, Lianah Peed A on: 10/31/2023 11:23 AM   Modules accepted: Orders

## 2023-10-31 NOTE — Telephone Encounter (Signed)
 Rx has been sent & pt is aware. Nothing further needed.

## 2023-11-12 ENCOUNTER — Telehealth (HOSPITAL_BASED_OUTPATIENT_CLINIC_OR_DEPARTMENT_OTHER): Payer: Self-pay

## 2023-11-12 NOTE — Telephone Encounter (Signed)
 Copied from CRM [id number]. Topic: Clinical - Prescription Issue >> Nov 11, 2023  9:45 AM Steve Riley wrote: Reason for CRM: Pt stated he is taking albuterol  neb solution 0.083% per Dr. Linder Revere, however, he feels like the previous neb solution prescribed by Dr. Dione Franks was working for him better than the type he is on now. Pt stated he is taking this solution every 4 hours and feels like he will run out before he can obtain more. Pt stated the [facility] told him to tell his provider Dr. Linder Revere to fax over the order RFS with office notes to [phone] and that it was urgent. I looked at the patient's medication list, and from what I can tell, the neb solutions look very similar if not the same. Please call the pt back at [phone] ok to leave a vm.

## 2023-11-13 ENCOUNTER — Ambulatory Visit: Payer: Self-pay | Admitting: Internal Medicine

## 2023-11-13 ENCOUNTER — Ambulatory Visit (INDEPENDENT_AMBULATORY_CARE_PROVIDER_SITE_OTHER): Admitting: Internal Medicine

## 2023-11-13 VITALS — BP 143/97 | HR 75

## 2023-11-13 DIAGNOSIS — J441 Chronic obstructive pulmonary disease with (acute) exacerbation: Secondary | ICD-10-CM

## 2023-11-13 DIAGNOSIS — J9611 Chronic respiratory failure with hypoxia: Secondary | ICD-10-CM | POA: Diagnosis not present

## 2023-11-13 MED ORDER — REVEFENACIN 175 MCG/3ML IN SOLN: 175.0000 ug | Freq: Every day | RESPIRATORY_TRACT | Status: DC

## 2023-11-13 MED ORDER — THEOPHYLLINE ER 200 MG PO TB12: 200.0000 mg | ORAL_TABLET | Freq: Two times a day (BID) | ORAL | 5 refills | Status: DC

## 2023-11-13 MED ORDER — PREDNISONE 10 MG PO TABS: ORAL_TABLET | ORAL | 0 refills | Status: DC

## 2023-11-13 NOTE — Progress Notes (Signed)
 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    [facility] also denied Dupixent  due to eosinophils not elevated. Ohtuvayre  is not approved for both of the following clinical reasons: - must have had at least 2 moderate COPD exacerbations or at least 1 severe COPD exacerbation (requiring hospitalization) in previous 12 months. Notes indicate one exacerbation in March 2024 - Adherence to COPD medications as evidence by rx fill history. Fill history shows patient has not filled Symbicort  since May 2024 Sherry Pennant, PharmD, MPH, BCPS, CPP  Discussed the use of AI scribe software for clinical note transcription with the patient, who gave verbal consent to proceed.  History of Present Illness   The patient, with a history of COPD, presents with worsening breathing issues. He reports using a nebulizer with albuterol  sulfate every 6 hours, along with Symbicort  and Spiriva . Despite these treatments, the patient's breathing worsens at night and in the late afternoon, requiring the use of supplemental oxygen. The patient describes waking up unable to  breathe and needing to use oxygen for a couple of hours in the morning. By the late afternoon, around six or six-thirty, the patient needs to start using the oxygen again as his breathing worsens. [facility] did not approve Dupixent  or Otuvayre. Dry cough.  In addition to his respiratory issues, the patient also reports having sores on his leg and a skin condition called prurigo nodularis. The patient has been treating the sores with cold-pressed virgin olive oil, which he reports has sped up the healing process. The patient also mentions that he is a light smoker still- smokes three to four cigarettes a day. He expresses a desire to quit smoking and has previously tried ?bupropion ?.     CXR 09/05/22- IMPRESSION: COPD changes without acute abnormalities. Emphysema (ICD10-J43.9).   Assessment and Plan:    Chronic Obstructive Pulmonary Disease (COPD) Worsening dyspnea, especially at night and in the evening. Currently on Albuterol  sulfate via nebulizer, Symbicort , and Spiriva . [place] denied approval for Ohtuvayre  and Dupixent  due to lack of recent moderate to severe exacerbations. Discussed the addition of Duoneb to nebulizer regimen. -Order Duoneb for nebulizer use, send prescription to [facility] pharmacy.  Oxygen Use Patient uses oxygen intermittently, especially at night and in the evening. Reports discomfort with current nasal cannula. -Order a BiFlow nasal cup mask from [facility] for patient to trial for comfort.  Tobacco Use Patient reports smoking 3-4 cigarettes per day, expresses desire to quit. Previously tried bupropion  temporary success. -Encourage continued efforts to quit smoking.  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 Patient reports some relief with use of cold pressed virgin olive oil. -Continue current skin care regimen as it seems to be providing some relief.  Follow-up Monitor response to  new nebulizer medication and  comfort with new oxygen mask. Continue to support smoking cessation efforts.      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 Arrived with significant wheezing. Vague about medication compliance. Denies chest pain, fever, purulent sputum. Says he quit smoking again in March. Exacerbation> Yupelri  neb here. Scripts for prednisone  taper and try theophylline  with careful discussion.  ROS-see HPI   + = positive Constitutional:    +weight loss, night sweats, fevers, chills, fatigue, lassitude. HEENT:    headaches, difficulty swallowing, tooth/dental problems, sore throat,       +sneezing, itching, ear ache, +nasal congestion, post nasal drip, snoring CV:    chest pain, orthopnea, PND, swelling in lower extremities, anasarca, dizziness, palpitations Resp:   +shortness of breath whith exertion or at rest.                +productive cough,   non-productive cough, coughing up of blood.              change in color of mucus.  +wheezing.   Skin:    rash or lesions. GI:  No-   heartburn, indigestion, abdominal pain, nausea, vomiting, diarrhea,                 change in bowel habits, loss of appetite GU: dysuria, change in color of urine, no urgency or frequency.   flank pain. MS:   joint pain, stiffness, decreased range of motion, back pain. Neuro-     nothing unusual Psych:  change in mood or affect.  depression or anxiety.   memory loss.  OBJ- Physical Exam   General- Alert, Oriented, Affect-appropriate, Distress- none acute Skin-+extensive reticular scaring on arms and few scattered shallow 1cm ulcers, all attributed to prurigo nodularis. Lymphadenopathy- none Head- atraumatic             Eyes- Gross vision intact, PERRLA, conjunctivae and secretions clear            Ears- +Hearing aids            Nose- Clear, no-Septal dev, mucus, polyps, erosion, perforation             Throat- Mallampati III , mucosa clear , drainage- none, tonsils- atrophic, +dentures Neck- flexible , trachea midline, no stridor , thyroid nl, carotid no bruit Chest - symmetrical excursion , unlabored           Heart/CV- RRR , no murmur , no gallop  , no rub, nl s1 s2                           - JVD- none , edema- none, stasis changes- none, varices- none           Lung- +diminished, wheeze+, cough- none , dullness-none, rub- none           Chest wall-  Abd-  Br/ Gen/ Rectal- Not done, not indicated Extrem- cyanosis- none, clubbing, none, atrophy- none, strength- nl Neuro- grossly intact to observation

## 2023-11-13 NOTE — Patient Instructions (Signed)
 Order- nebulizer treatment here with Delfin Fell-   see if this helps your breathing today  Script sent for a prednisone  taper  Order- script sent to try theophylline tabs- take with food.

## 2023-11-13 NOTE — Telephone Encounter (Signed)
 FYI Only or Action Required?: FYI only for provider  Patient is followed in Pulmonology for COPD, last seen on 06/16/2023 by Steve Hood, MD. Called Nurse Triage reporting Shortness of Breath. Symptoms began several weeks ago. Interventions attempted: Nebulizer treatments, Home oxygen use, and Increased fluids/rest. Symptoms are: gradually worsening.  Triage Disposition: See HCP Within 4 Hours (Or PCP Triage)  Patient/caregiver understands and will follow disposition?: Yes          Copied from CRM [id number]. Topic: Clinical - Red Word Triage >> Nov 13, 2023  9:43 AM Steve Riley C wrote: Red Word that prompted transfer to Nurse Triage: Steve Riley nurse manager from [facility] [phone] states patient has been trying to get a nebulizer solution prescribed. Steve Riley states it looks like patient has been [facility] and [facility] on this issues. Steve Riley is trying to find a solution for patient. Steve Riley called patient this morning about 30 minutes ago, patient states new nebulizer is not working well, having more shortness of breath and oxygen level 89-90 without oxygen. Please advise and call back patient [phone] for clarification.  Copied from CRM [id number]. Topic: Clinical - Prescription Issue >> Nov 11, 2023  9:45 AM Steve Riley wrote: Reason for CRM: Pt stated he is taking albuterol  neb solution 0.083% per Dr. Linder Riley, however, he feels like the previous neb solution prescribed by Dr. Dione Riley was working for him better than the type he is on now. Pt stated he is taking this solution every 4 hours and feels like he will run out before he can obtain more. Pt stated the [facility] told him to tell his provider Dr. Linder Riley to fax over the order RFS with office notes to [phone] and that it was urgent. I looked at the patient's medication list, and from what I can tell, the neb solutions look very similar if not the same. Please call the pt back at [phone] ok to leave a vm.   Reason for  Disposition  [1] MILD difficulty breathing (e.g., minimal/no SOB at rest, SOB with walking, pulse <100) AND [2] NEW-onset or WORSE than normal  Answer Assessment - Initial Assessment Questions E2C2 Pulmonary Triage - Initial Assessment Questions "Chief Complaint (e.g., cough, sob, wheezing, fever, chills, sweat or additional symptoms) *Go to specific symptom protocol after initial questions. Someone would call and get it all straightened out, waited for over a week, called pharmacy to see if been changed over, called and spoke to agent Steve Riley, first lady said first prescription been discontinued and that person should have called me back and told me that, gotten [facility] touch with [facility] Gonna switch hearing aids, sounds like you're in a barrel honey SOB worse when up and moving around Pretty hard for me to breathe right now No chest pain, sweating, dizziness, or weakness Don't have any energy Speaking in full sentences Pt unclear over phone about which nebulizer med works for him and which he feels doesn't work as well, stating the "older" one works better, advised pt bring meds to appt  "How long have symptoms been present?" Since started taking this other med 3 weeks something like that  "Have you used your inhalers/maintenance medication?" Yes If yes, "What medications?" Albuterol  nebulizer solution every 4 hours Don't know if my condition is getting worse or what Back to wheezing again an hour later, other med I wasn't  OXYGEN: "Do you wear supplemental oxygen?" Yes If yes, "How many liters are you supposed to use?" 2.5L but if on  oxygen tank 2L  "Do you monitor your oxygen levels?" Yes If yes, "What is your reading (oxygen level) today?" 91-93% right now Started out at 93% and went down to 89% this morning when talking with Steve Riley from [facility]  "What is your usual oxygen saturation reading?"  (Note: Pulmonary O2 sats should be 90% or greater) Highest it goes is 94%  6.  CARDIAC HISTORY: "Do you have any history of heart disease?" (e.g., heart attack, angina, bypass surgery, angioplasty)      Significant  7. LUNG HISTORY: "Do you have any history of lung disease?"  (e.g., pulmonary embolus, asthma, emphysema)     significant  Protocols used: Breathing Difficulty-A-AH

## 2023-11-13 NOTE — Progress Notes (Signed)
 Patient struggling in the lobby.  Sats on RA 97% and HR 74.  Patient was wheezing and had a congested cough.  He wears O2 at HS only at 2L.  We placed him on 2L of oxygen for comfort and to ambulate to exam room.  Dr. Linder Revere made aware and patient taken directly to exam room for exam.

## 2023-11-25 ENCOUNTER — Telehealth: Payer: Self-pay

## 2023-11-25 NOTE — Telephone Encounter (Signed)
 Copied from CRM [id number]. Topic: Clinical - Medication Question >> Nov 25, 2023  9:42 AM [name] wrote: Reason for CRM: Patient has questions in regard to theophylline  (THEODUR) 200 MG 12 hr tablet refills.   Callback number: [phone]   Spoke with Steve Riley, pt states he is having trouble getting refills for Theodur. Pt is going to contact [facility] to see what is needed. Pt states he is confused on why he can't get more refills and I advised pt if new script was needed to let us  know. Will wait for further information on what patients concerns are.

## 2023-11-25 NOTE — Telephone Encounter (Signed)
 Copied from CRM [id number]. Topic: Clinical - Prescription Issue >> Nov 21, 2023 11:33 AM Crist Dominion wrote: Reason for CRM: Patient states he needs to Dr. Linder Revere to please email patient information and plan of care records to [facility] email he provided in order for the [facility] to re [facility] his prescriptions. Email: [email or web address] Subject: Pulmonary

## 2023-12-01 NOTE — Telephone Encounter (Signed)
 Copied from CRM [id number]. Topic: General - Other >> Dec 01, 2023 10:44 AM Steve Riley wrote: Reason for CRM: Steve Riley is calling with a continuation of services request. States that pulmonary authorization has expired and they will need a continuation of service faxed to [phone] for any further coverage regarding follow-ups.   Requesting recent OV notes be submitted as well to support reasoning for continuation.   Called patient.  This number is a fax number to the Steve Riley.  Patient needs his last OV notes faxed to the [facility] for continuation of care so he can get his medications and for authorization to keep coming to see [name].  Will print out OV notes and fax to [facility] at [phone].

## 2023-12-03 NOTE — Telephone Encounter (Signed)
 Faxed last two office notes to Attn: Sherrie at [phone].

## 2023-12-04 NOTE — Telephone Encounter (Signed)
 [facility] authorization requested via email, patient aware.

## 2023-12-18 ENCOUNTER — Ambulatory Visit: Payer: Self-pay

## 2023-12-18 ENCOUNTER — Encounter: Payer: Self-pay | Admitting: Internal Medicine

## 2023-12-18 NOTE — Telephone Encounter (Addendum)
 FYI Only or Action Required?: Action required by provider: update on patient condition./requesting appt.  Patient is followed in Pulmonology for COPD, last seen on 11/13/2023 by Neysa Reggy BIRCH, MD.  Called Nurse Triage reporting Shortness of Breath.  Symptoms began several days ago.  Interventions attempted: Maintenance inhaler, Nebulizer treatments, and Home oxygen use.  Symptoms are: gradually worsening.  Triage Disposition: See Physician Within 24 Hours, Call PCP Now  Patient/caregiver understands and will follow disposition?: Unsure     Copied from CRM [id number]. Topic: Clinical - Red Word Triage >> Dec 18, 2023  1:39 PM Leila C wrote: Red Word that prompted transfer to Nurse Triage: Patient [phone] states [facility] for COPD valid 12/08/23-06/05/24. Patient is trying t make an appointment for 1 month f/u COPD with [name]. Patient states is having hard time to breath, shortness of breath, wheezing and currently sitting with oxygen machine. Patient is unsure if Theophylline  is helping, patient feels ill. Patient denies dizziness nor pain. Please advise.    ----------------------------------------------------------------------- From previous Reason for Contact - Scheduling: Patient/patient representative is calling to schedule an appointment. Refer to attachments for appointment information. Reason for Disposition  [1] Angry or rude caller AND [2] doesn't respond to 5 minutes of triager counseling AND [3] sick adult (or caller)  Answer Assessment - Initial Assessment Questions E2C2 Pulmonary Triage - Initial Assessment Questions Chief Complaint (e.g., cough, sob, wheezing, fever, chills, sweat or additional symptoms) *Go to specific symptom protocol after initial questions. SOB  How long have symptoms been present? 3 weeks +  Have you tested for COVID or Flu? Note: If not, ask patient if a home test can be taken. If so, instruct  patient to call back for positive results. No  MEDICINES:   Have you used any OTC meds to help with symptoms? No If yes, ask What medications? N/a  Have you used your inhalers/maintenance medication? Yes If yes, What medications? Symbicort  Spiriva  Albuterol  NEB - q6 yours Pt does not recall Yupelri  neb - Pt reports has been having issues with this Rx  If inhaler, ask How many puffs and how often? Note: Review instructions on medication in the chart. See above  OXYGEN: Do you wear supplemental oxygen? Yes If yes, How many liters are you supposed to use? 2.5L only at night - is currently requiring usage now  Do you monitor your oxygen levels? Yes If yes, What is your reading (oxygen level) today? 97-99  What is your usual oxygen saturation reading?  (Note: Pulmonary O2 sats should be 90% or greater) 90's     1. RESPIRATORY STATUS: Describe your breathing? (e.g., wheezing, shortness of breath, unable to speak, severe coughing)      SOB Today having a hard time breathing - currently on O2 now (typically only on O2 at night 2. ONSET: When did this breathing problem begin?      3 weeks + 3. PATTERN Does the difficult breathing come and go, or has it been constant since it started?      Come and goes 4. SEVERITY: How bad is your breathing? (e.g., mild, moderate, severe)      Mild-moderate 5. RECURRENT SYMPTOM: Have you had difficulty breathing before? If Yes, ask: When was the last time? and What happened that time?      *No Answer* 6. CARDIAC HISTORY: Do you have any history of heart disease? (e.g., heart attack, angina, bypass surgery, angioplasty)      *No Answer*  7. LUNG HISTORY: Do you have any history of lung disease?  (e.g., pulmonary embolus, asthma, emphysema)     COPD 8. CAUSE: What do you think is causing the breathing problem?      unknown 9. OTHER SYMPTOMS: Do you have any other symptoms? (e.g., chest pain, cough,  dizziness, fever, runny nose)     Irregular BM - typically goes 2x daily 10. O2 SATURATION MONITOR:  Do you use an oxygen saturation monitor (pulse oximeter) at home? If Yes, ask: What is your reading (oxygen level) today? What is your usual oxygen saturation reading? (e.g., 95%)       See above 11. PREGNANCY: Is there any chance you are pregnant? When was your last menstrual period?       N/a 12. TRAVEL: Have you traveled out of the country in the last month? (e.g., travel history, exposures)       *No Answer*  Answer Assessment - Initial Assessment Questions 1. SITUATION:  Document reason for call.     Pt reports calling [facility] d/t Rx issues with [facility] (pt reports [employer] has not been renewed?). [facility] instructed pt to schedule with [facility] within 14 days. Pt reports that [facility] is responsible for renewing. Pt reports that he has been waiting for follow up since last week.  2. BACKGROUND: Document any background information (e.g., prior calls, known psychiatric history)     Per chart, bipolar 3. ASSESSMENT: Document your nursing assessment.     Pt was emotionally labile during call and did occasionally apologize for behavior. From information gathered, Triager advised that pt needed to be evaluated for acute sx at this time. Triager attempted to schedule acute appt with [facility], but no access. Triager advised pt to seek evaluation/tx from PCP (non-E2C2 practice). Pt does endorse having PCP appt next month. Triager strongly reinforced being seen for acute symptoms.  4. RESPONSE: Document what your response or recommendation was.     See above    Pt reports has been back and fourth with [facility] and [facility] representatives to ensure re-certification -- Triager unsure if r/t approved appts or with medication. Triager will forward encounter for [name] 's office to review and advise. Patient verbalized understanding and is expecting call back from office for next steps/to clarify pt needs. Triager  also advised that if pt does not hear back from office, to follow disposition for further evaluation/treatment.  Protocols used: Breathing Difficulty-A-AH, Difficult Call-A-AH

## 2023-12-18 NOTE — Assessment & Plan Note (Signed)
 Education- needs O2, discussed Plan- encourage O2 2L with exertion outside of home.

## 2023-12-18 NOTE — Assessment & Plan Note (Addendum)
 Significant exacerbation. Medications reviewed and emphasized. Plan- Yupelri  neb here. Scripts for prednisone  taper and theophylline .

## 2023-12-18 NOTE — Telephone Encounter (Signed)
 Ok to bring him in for next available me or APP. Meanwhile, please offer prednisone  10 mg, # 20, 4 X 2 DAYS, 3 X 2 DAYS, 2 X 2 DAYS, 1 X 2 DAYS and Zpak 250 mg, # 6,2 today then one daily

## 2023-12-19 MED ORDER — PREDNISONE 10 MG PO TABS: ORAL_TABLET | ORAL | 0 refills | Status: AC

## 2023-12-19 MED ORDER — AZITHROMYCIN 250 MG PO TABS: 250.0000 mg | ORAL_TABLET | Freq: Every day | ORAL | 0 refills | Status: DC

## 2023-12-19 NOTE — Telephone Encounter (Signed)
 ATC x1. Lvm to call back. Sending Mychart message  Tried calling patient again, and patient answered. I advised pt on how to take Prednisone  & Z-pak. Pt is aware meds were sent to pharmacy. Nfn

## 2023-12-31 NOTE — Progress Notes (Unsigned)
 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 Arrived with significant wheezing. Vague about medication compliance. Denies chest pain, fever, purulent sputum. Says he quit smoking again in March. Exacerbation> Yupelri  neb here. Scripts for prednisone  taper and try theophylline  with careful discussion.  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 Discussed the use of AI scribe software for clinical note transcription with the patient, who gave verbal consent to proceed.  History of Present Illness   Steve Riley is a 73 year old male who presents with constipation and urinary retention possibly related to theophylline  use.  He takes theophylline , initially at two x 300 mg pills daily, reduced to one pill daily per [place] advice, with no symptom  improvement. Constipation and urinary retention persist, with difficulty urinating and no bowel movement since Monday. His abdomen appears distended. He uses dietary measures and medications like Cinemax, Dulcolax, and enemas for constipation relief.  He continues using a Spiriva  inhaler long-term. Theophylline  may increase belching and flatulence. Previously, he had regular morning bowel movements after coffee, but this routine has changed. We discussed common side effects of theophylline . He has breathing much better since starting theophylline , so we will let him taper the dose, watching effects on dyspnea, GI/ and GU symptoms..     Assessment and Plan:    COPD mixed type Noes his chest is much clearer since starting theophylline .  Taper dose as needed to manage side effectss.   Constipation Chronic constipation likely exacerbated by theophylline  use. - Reduce theophylline  to half a pill twice a day. - If constipation persists, reduce theophylline  to half a pill once a day. - Consider stopping theophylline  for a week to reset gastrointestinal system.  Urinary retention Urinary retention possibly related to theophylline  use. - Monitor urinary symptoms with adjusted theophylline  dosage. - If symptoms persist, consider stopping theophylline  for a week.    History of Present Illness  ROS-see HPI   + = positive Constitutional:    +weight loss, night sweats, fevers, chills, fatigue, lassitude. HEENT:    headaches, difficulty swallowing, tooth/dental problems, sore throat,       +sneezing, itching, ear ache, +nasal congestion, post nasal drip, snoring CV:    chest pain, orthopnea, PND, swelling in lower extremities, anasarca, dizziness, palpitations Resp:   +shortness of breath whith exertion or at rest.                +productive cough,   non-productive cough, coughing up of blood.  change in color of mucus.  +wheezing.   Skin:    rash or lesions. GI:  No-   heartburn,  indigestion, abdominal pain, nausea, vomiting, diarrhea,                 change in bowel habits, loss of appetite GU: dysuria, change in color of urine, no urgency or frequency.   flank pain. MS:   joint pain, stiffness, decreased range of motion, back pain. Neuro-     nothing unusual Psych:  change in mood or affect.  depression or anxiety.   memory loss.  OBJ- Physical Exam   General- Alert, Oriented, Affect-appropriate, Distress- none acute Skin-+extensive reticular scaring on arms and few scattered shallow 1cm ulcers, all attributed to prurigo nodularis. Lymphadenopathy- none Head- atraumatic            Eyes- Gross vision intact, PERRLA, conjunctivae and secretions clear            Ears- +Hearing aids            Nose- Clear, no-Septal dev, mucus, polyps, erosion, perforation             Throat- Mallampati III , mucosa clear , drainage- none, tonsils- atrophic, +dentures Neck- flexible , trachea midline, no stridor , thyroid nl, carotid no bruit Chest - symmetrical excursion , unlabored           Heart/CV- RRR , no murmur , no gallop  , no rub, nl s1 s2                           - JVD- none , edema- none, stasis changes- none, varices- none           Lung- +diminished, wheeze+, cough- none , dullness-none, rub- none           Chest wall-  Abd-  Br/ Gen/ Rectal- Not done, not indicated Extrem- cyanosis- none, clubbing, none, atrophy- none, strength- nl Neuro- grossly intact to observation

## 2024-01-01 ENCOUNTER — Ambulatory Visit (INDEPENDENT_AMBULATORY_CARE_PROVIDER_SITE_OTHER): Admitting: Internal Medicine

## 2024-01-01 ENCOUNTER — Encounter: Payer: Self-pay | Admitting: Internal Medicine

## 2024-01-01 VITALS — BP 128/70 | HR 86 | Temp 97.8°F | Ht 68.0 in | Wt 157.0 lb

## 2024-01-01 DIAGNOSIS — J449 Chronic obstructive pulmonary disease, unspecified: Secondary | ICD-10-CM | POA: Diagnosis not present

## 2024-01-01 DIAGNOSIS — Z87891 Personal history of nicotine dependence: Secondary | ICD-10-CM | POA: Diagnosis not present

## 2024-01-01 NOTE — Patient Instructions (Signed)
 Try off theophyline for a week to let your gut get better. Then try starting theophylline  again at lower dose-1/2 tab twice daily. If necessary, drop theophylline  to 1/2 tab, one time daily. See if that still seems to help your breathing enough to bother with.  Glad you are breathing better.

## 2024-02-06 ENCOUNTER — Other Ambulatory Visit: Payer: Self-pay | Admitting: Internal Medicine

## 2024-02-06 NOTE — Telephone Encounter (Signed)
 Copied from CRM [id number]. Topic: Clinical - Medication Refill >> Feb 06, 2024  3:44 PM Corean SAUNDERS wrote: Medication: Tiotropium Bromide  Monohydrate (SPIRIVA  RESPIMAT) 2.5 MCG/ACT AERS  Has the patient contacted their pharmacy? Yes, patient advised to call  (Agent: If no, request that the patient contact the pharmacy for the refill. If patient does not wish to contact the pharmacy document the reason why and proceed with request.) (Agent: If yes, when and what did the pharmacy advise?)  This is the patient's preferred pharmacy:   [facility] [place], [place] - [address] [place] [address] Phone: [phone] Fax: [phone]   Is this the correct pharmacy for this prescription? Yes If no, delete pharmacy and type the correct one.   Has the prescription been filled recently? No  Is the patient out of the medication? No  Has the patient been seen for an appointment in the last year OR does the patient have an upcoming appointment? Yes  Can we respond through MyChart? No Agent: Please be advised that Rx refills may take up to 3 business days. We ask that you follow-up with your pharmacy.

## 2024-02-11 MED ORDER — SPIRIVA RESPIMAT 2.5 MCG/ACT IN AERS: 1.0000 | INHALATION_SPRAY | Freq: Two times a day (BID) | RESPIRATORY_TRACT | 4 refills | Status: DC

## 2024-02-13 MED ORDER — SPIRIVA RESPIMAT 2.5 MCG/ACT IN AERS: 1.0000 | INHALATION_SPRAY | Freq: Two times a day (BID) | RESPIRATORY_TRACT | 4 refills | Status: AC

## 2024-02-13 NOTE — Addendum Note (Signed)
 Addended byBETHA FRIES, Jamis Kryder A on: 02/13/2024 02:07 PM   Modules accepted: Orders

## 2024-02-13 NOTE — Telephone Encounter (Signed)
 Called and spoke with the patient..  Pt was very upset spiriva  inhaler was sent to wrong pharmacy.  I have sent rx to [facility] per patient request.  Pt has also been scheduled with new provider as [name] is leaving the practice soon. [name] pt is requesting refills for albuterol  but this was not mentioned in lov note. And the previous visit before that pt was told to start Yupelri  but I do not think this medication got started. [name] please advise if okay to send refills of albuterol  neb solution to the [facility].

## 2024-02-13 NOTE — Telephone Encounter (Signed)
 Ok to send requested meds ti [place], [place] pharmacy. Can send Yupelri , but [facility] may not have approved it, in which case drop it.

## 2024-02-23 MED ORDER — ALBUTEROL SULFATE (2.5 MG/3ML) 0.083% IN NEBU: 2.5000 mg | INHALATION_SOLUTION | RESPIRATORY_TRACT | 5 refills | Status: AC | PRN

## 2024-02-23 MED ORDER — REVEFENACIN 175 MCG/3ML IN SOLN: 175.0000 ug | Freq: Every day | RESPIRATORY_TRACT | Status: DC

## 2024-02-23 NOTE — Telephone Encounter (Signed)
 Rx's sent to pharmacy.

## 2024-02-23 NOTE — Addendum Note (Signed)
 Addended byBETHA FRIES, Sincere Liuzzi A on: 02/23/2024 11:50 AM   Modules accepted: Orders

## 2024-05-06 ENCOUNTER — Emergency Department (HOSPITAL_BASED_OUTPATIENT_CLINIC_OR_DEPARTMENT_OTHER)

## 2024-05-06 ENCOUNTER — Emergency Department (HOSPITAL_BASED_OUTPATIENT_CLINIC_OR_DEPARTMENT_OTHER): Admitting: Radiology

## 2024-05-06 ENCOUNTER — Emergency Department (HOSPITAL_BASED_OUTPATIENT_CLINIC_OR_DEPARTMENT_OTHER)
Admission: EM | Admit: 2024-05-06 | Discharge: 2024-05-06 | Disposition: A | Discharge status: Home / Self Care | Attending: Emergency Medicine | Admitting: Emergency Medicine

## 2024-05-06 ENCOUNTER — Other Ambulatory Visit: Payer: Self-pay

## 2024-05-06 DIAGNOSIS — I7102 Dissection of abdominal aorta: Secondary | ICD-10-CM | POA: Insufficient documentation

## 2024-05-06 DIAGNOSIS — Z7982 Long term (current) use of aspirin: Secondary | ICD-10-CM | POA: Insufficient documentation

## 2024-05-06 DIAGNOSIS — J441 Chronic obstructive pulmonary disease with (acute) exacerbation: Secondary | ICD-10-CM | POA: Insufficient documentation

## 2024-05-06 DIAGNOSIS — R059 Cough, unspecified: Secondary | ICD-10-CM | POA: Diagnosis present

## 2024-05-06 DIAGNOSIS — I714 Abdominal aortic aneurysm, without rupture, unspecified: Secondary | ICD-10-CM | POA: Diagnosis not present

## 2024-05-06 DIAGNOSIS — R911 Solitary pulmonary nodule: Secondary | ICD-10-CM | POA: Insufficient documentation

## 2024-05-06 DIAGNOSIS — Z7951 Long term (current) use of inhaled steroids: Secondary | ICD-10-CM | POA: Insufficient documentation

## 2024-05-06 LAB — CBC WITH DIFFERENTIAL/PLATELET
Abs Immature Granulocytes: 0.02 K/uL (ref 0.00–0.07)
Basophils Absolute: 0 K/uL (ref 0.0–0.1)
Basophils Relative: 0 %
Eosinophils Absolute: 0.1 K/uL (ref 0.0–0.5)
Eosinophils Relative: 1 %
HCT: 45.6 % (ref 39.0–52.0)
Hemoglobin: 15.5 g/dL (ref 13.0–17.0)
Immature Granulocytes: 0 %
Lymphocytes Relative: 25 %
Lymphs Abs: 2.1 K/uL (ref 0.7–4.0)
MCH: 30.2 pg (ref 26.0–34.0)
MCHC: 34 g/dL (ref 30.0–36.0)
MCV: 88.7 fL (ref 80.0–100.0)
Monocytes Absolute: 0.7 K/uL (ref 0.1–1.0)
Monocytes Relative: 8 %
Neutro Abs: 5.5 K/uL (ref 1.7–7.7)
Neutrophils Relative %: 66 %
Platelets: 204 K/uL (ref 150–400)
RBC: 5.14 MIL/uL (ref 4.22–5.81)
RDW: 12.2 % (ref 11.5–15.5)
WBC: 8.4 K/uL (ref 4.0–10.5)
nRBC: 0 % (ref 0.0–0.2)

## 2024-05-06 LAB — COMPREHENSIVE METABOLIC PANEL WITH GFR
ALT: 41 U/L (ref 0–44)
AST: 49 U/L — ABNORMAL HIGH (ref 15–41)
Albumin: 4.3 g/dL (ref 3.5–5.0)
Alkaline Phosphatase: 110 U/L (ref 38–126)
Anion gap: 12 (ref 5–15)
BUN: 13 mg/dL (ref 8–23)
CO2: 26 mmol/L (ref 22–32)
Calcium: 10.3 mg/dL (ref 8.9–10.3)
Chloride: 97 mmol/L — ABNORMAL LOW (ref 98–111)
Creatinine, Ser: 0.82 mg/dL (ref 0.61–1.24)
GFR, Estimated: >60 mL/min (ref >60)
Glucose, Bld: 97 mg/dL (ref 70–99)
Potassium: 4.1 mmol/L (ref 3.5–5.1)
Sodium: 135 mmol/L (ref 135–145)
Total Bilirubin: 0.8 mg/dL (ref 0.0–1.2)
Total Protein: 7.9 g/dL (ref 6.5–8.1)

## 2024-05-06 LAB — RESP PANEL BY RT-PCR (RSV, FLU A&B, COVID)  RVPGX2
Influenza A by PCR: NEGATIVE
Influenza B by PCR: NEGATIVE
Resp Syncytial Virus by PCR: NEGATIVE
SARS Coronavirus 2 by RT PCR: NEGATIVE

## 2024-05-06 MED ORDER — AZITHROMYCIN 250 MG PO TABS: 250.0000 mg | ORAL_TABLET | Freq: Every day | ORAL | 0 refills | Status: DC

## 2024-05-06 MED ORDER — PREDNISONE 20 MG PO TABS: 40.0000 mg | ORAL_TABLET | Freq: Every day | ORAL | 0 refills | Status: DC

## 2024-05-06 MED ORDER — METHYLPREDNISOLONE SODIUM SUCC 125 MG IJ SOLR
125.0000 mg | INTRAMUSCULAR | Status: AC
  Administered 2024-05-06: 125 mg via INTRAVENOUS
  Filled 2024-05-06: qty 2

## 2024-05-06 MED ORDER — IPRATROPIUM-ALBUTEROL 0.5-2.5 (3) MG/3ML IN SOLN
3.0000 mL | RESPIRATORY_TRACT | Status: AC
  Administered 2024-05-06: 3 mL via RESPIRATORY_TRACT
  Filled 2024-05-06: qty 3

## 2024-05-06 MED ORDER — ALBUTEROL SULFATE (2.5 MG/3ML) 0.083% IN NEBU
INHALATION_SOLUTION | RESPIRATORY_TRACT | Status: AC
Start: 2024-05-06 — End: 2024-05-06
  Administered 2024-05-06: 2.5 mg
  Filled 2024-05-06: qty 3

## 2024-05-06 MED ORDER — ASPIRIN 81 MG PO CHEW: 81.0000 mg | CHEWABLE_TABLET | Freq: Every day | ORAL | 2 refills | Status: AC

## 2024-05-06 MED ORDER — ASPIRIN 81 MG PO CHEW: 81.0000 mg | CHEWABLE_TABLET | Freq: Every day | ORAL | 2 refills | Status: DC

## 2024-05-06 MED ORDER — IPRATROPIUM-ALBUTEROL 0.5-2.5 (3) MG/3ML IN SOLN
RESPIRATORY_TRACT | Status: AC
Start: 2024-05-06 — End: 2024-05-06
  Administered 2024-05-06: 3 mL
  Filled 2024-05-06: qty 3

## 2024-05-06 MED ORDER — AZITHROMYCIN 250 MG PO TABS
500.0000 mg | ORAL_TABLET | ORAL | Status: AC
  Administered 2024-05-06: 500 mg via ORAL
  Filled 2024-05-06: qty 2

## 2024-05-06 MED ORDER — IOHEXOL 350 MG/ML SOLN
100.0000 mL | Freq: Once | INTRAVENOUS | Status: AC | PRN
  Administered 2024-05-06: 100 mL via INTRAVENOUS

## 2024-05-06 NOTE — Discharge Instructions (Addendum)
 You were seen for your COPD exacerbation in the emergency department.   At home, please take the prednisone  and azithromycin .  You may use your ipratropium/albuterol  nebulizer every 4 hours.  Check your MyChart online for the results of any tests that had not resulted by the time you left the emergency department.   Follow-up with your primary doctor in 2-3 days regarding your visit.  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  You will need to follow-up with the vascular surgeons in 6 months.  Return immediately to the emergency department if you experience any of the following: Difficulty breathing, severe flank pain or abdominal pain, lower extremity numbness, weakness, or pain, or any other concerning symptoms.    Thank you for visiting our Emergency Department. It was a pleasure taking care of you today.

## 2024-05-06 NOTE — ED Triage Notes (Signed)
 He reports long-term issues with shortness of breath and cough. Somewhat worse since this Monday. He tells us  he is chronically on O2 at 2 l.p.m. He states he saw his [facility] doctor this Monday. He further tells us  that he takes breathing treatments at home.

## 2024-05-06 NOTE — ED Provider Notes (Signed)
 [place] EMERGENCY DEPARTMENT AT [facility] Provider Note   CSN: [id number] Arrival date & time: 05/06/24  1000     Patient presents with: Chronic cough/Shob   Steve Riley is a 73 y.o. male.   73 year old male history of COPD on 2 L nasal cannula who presents to the emergency department with cough.  Patient reports that since Monday has been having a productive cough.  Says that he is starting to have some pain in his ribs from the coughing.  No fevers or chills.  Has had some congestion.  Is worried about RSV.  Has been trying breathing treatments at home but his symptoms persisted so he decided come into the emergency department.  No leg swelling.  No weight gain.       Prior to Admission medications   Medication Sig Start Date End Date Taking? Authorizing Provider  albuterol  (PROVENTIL ) (2.5 MG/3ML) 0.083% nebulizer solution Take 3 mLs (2.5 mg total) by nebulization every 4 (four) hours as needed for wheezing or shortness of breath. 02/23/24   Neysa Reggy BIRCH, MD  APPLE CIDER VINEGAR PO Take by mouth in the morning and at bedtime.     [provider]  Ascorbic Acid  (VITAMIN C  WITH ROSE HIPS) 1000 MG tablet Take 1,000 mg by mouth daily.    [provider]  aspirin  81 MG chewable tablet Chew 1 tablet (81 mg total) by mouth daily. 05/06/24 06/05/24  Yolande Lamar BROCKS, MD  azithromycin  (ZITHROMAX ) 250 MG tablet Take 1 tablet (250 mg total) by mouth daily. Take first 2 tablets together, then 1 every day until finished. 05/06/24   Yolande Lamar BROCKS, MD  budesonide -formoterol  (SYMBICORT ) 160-4.5 MCG/ACT inhaler Inhale 2 puffs into the lungs 2 (two) times daily. 10/16/22   Meade Verdon RAMAN, MD  buPROPion  (WELLBUTRIN  SR) 150 MG 12 hr tablet Take 150 mg by mouth 2 (two) times daily. 05/30/23   [provider]  Calcium  Carbonate-Vitamin D  600-400 MG-UNIT chew tablet Chew 1 tablet by mouth 2 (two) times daily.     [provider]  Cyanocobalamin   (B-12) 5000 MCG CAPS Take 5,000 mcg by mouth daily.    [provider]  Flaxseed, Linseed, (FLAX PO) Take 2,600 mg by mouth daily.    [provider]  folic acid (FOLVITE) 1 MG tablet Take 1 tablet by mouth daily. 07/10/21   [provider]  ipratropium-albuterol  (DUONEB) 0.5-2.5 (3) MG/3ML SOLN Inhale 3 mLs into the lungs every 6 (six) hours as needed (cough or wheeze or SOB). 06/16/23   [provider]  predniSONE  (DELTASONE ) 20 MG tablet Take 2 tablets (40 mg total) by mouth daily for 4 days. 05/06/24 05/10/24  Yolande Lamar BROCKS, MD  Probiotic CAPS Take 1 capsule by mouth 2 (two) times daily with a meal.    [provider]  revefenacin  (YUPELRI ) 175 MCG/3ML nebulizer solution Take 3 mLs (175 mcg total) by nebulization daily. 02/23/24   Neysa Reggy BIRCH, MD  selenium  200 MCG TABS tablet Take 200 mcg by mouth daily.    [provider]  theophylline  (THEODUR) 200 MG 12 hr tablet Take 1 tablet (200 mg total) by mouth 2 (two) times daily. Take with food Patient not taking: Reported on 01/01/2024 11/13/23   Neysa Reggy BIRCH, MD  Tiotropium Bromide  Monohydrate (SPIRIVA  RESPIMAT) 2.5 MCG/ACT AERS Inhale 1 puff into the lungs in the morning and at bedtime. 02/13/24   Neysa Reggy D, MD    Allergies: Penicillin g, Doxepin, Gabapentin,  and Tamsulosin    Review of Systems  Updated Vital Signs BP 139/69   Pulse 81   Temp 97.8 F (36.6 C) (Oral)   Resp 19   SpO2 92%   Physical Exam Vitals and nursing note reviewed.  Constitutional:      General: He is not in acute distress.    Appearance: He is well-developed.  HENT:     Head: Normocephalic and atraumatic.     Right Ear: External ear normal.     Left Ear: External ear normal.     Nose: Nose normal.  Eyes:     Extraocular Movements: Extraocular movements intact.     Conjunctiva/sclera: Conjunctivae normal.     Pupils: Pupils are equal, round, and reactive to light.  Cardiovascular:     Rate  and Rhythm: Normal rate and regular rhythm.     Heart sounds: Normal heart sounds.  Pulmonary:     Effort: Pulmonary effort is normal. No respiratory distress.     Breath sounds: Wheezing (Right greater than left) present.     Comments: On 2 L nasal cannula Musculoskeletal:     Cervical back: Normal range of motion and neck supple.     Right lower leg: No edema.     Left lower leg: No edema.  Skin:    General: Skin is warm and dry.  Neurological:     Mental Status: He is alert. Mental status is at baseline.  Psychiatric:        Mood and Affect: Mood normal.        Behavior: Behavior normal.     (all labs ordered are listed, but only abnormal results are displayed) Labs Reviewed  COMPREHENSIVE METABOLIC PANEL WITH GFR - Abnormal; Notable for the following components:      Result Value   Chloride 97 (*)    AST 49 (*)    All other components within normal limits  RESP PANEL BY RT-PCR (RSV, FLU A&B, COVID)  [id number]  CBC WITH DIFFERENTIAL/PLATELET    EKG: EKG Interpretation Date/Time:  Thursday May 06 2024 10:16:24 EST Ventricular Rate:  87 PR Interval:  201 QRS Duration:  93 QT Interval:  364 QTC Calculation: 438 R Axis:   23  Text Interpretation: Sinus arrhythmia Confirmed by Yolande Charleston [id number]) on 05/06/2024 1:16:07 PM  Radiology: CT ANGIO CHEST/ABD/PEL FOR DISSECTION W &/OR WO CONTRAST Result Date: 05/06/2024 CLINICAL DATA:  Possible dissection of the abdominal aorta noted on a current chest CT scan performed without contrast EXAM: CT ANGIOGRAPHY CHEST, ABDOMEN AND PELVIS TECHNIQUE: Non-contrast CT of the chest was initially obtained. Multidetector CT imaging through the chest, abdomen and pelvis was performed using the standard protocol during bolus administration of intravenous contrast. Multiplanar reconstructed images and MIPs were obtained and reviewed to evaluate the vascular anatomy. RADIATION DOSE REDUCTION: This exam was performed according to the  departmental dose-optimization program which includes automated exposure control, adjustment of the mA and/or kV according to patient size and/or use of iterative reconstruction technique. CONTRAST:  OMNIPAQUE  IOHEXOL  350 MG/ML SOLN COMPARISON:  Current chest CT. Prior abdomen and pelvis CT from 12/08/2020. FINDINGS: CTA CHEST FINDINGS Cardiovascular: Cardiac silhouette normal in size. Three-vessel coronary artery calcifications. No pericardial effusion. Great vessels normal in caliber. There is atherosclerotic plaque mostly along the descending thoracic aorta. No thoracic aortic dissection. Arch branch vessels are widely patent. Mediastinum/Nodes: No neck base, mediastinal or hilar masses. Several prominent lymph nodes. 9 mm short axis precarinal node. More prominent hilar nodes,  1.2 cm superior right hilar lymph node, 1.0 cm left infrahilar node and 9 mm short axis right infrahilar node. Nodes are unchanged from the prior exam, but better appreciated due to the presence of contrast. Trachea and esophagus are unremarkable. Lungs/Pleura: Lung findings include bronchial wall thickening, patchy and linear posterior right lower lobe peribronchovascular opacities consistent with atelectasis, scattered small nodules most of which are calcified granuloma, and significant underlying emphysema, all stable from the chest CT performed earlier today. No pleural effusion or pneumothorax. Musculoskeletal: No acute fracture.  No bone lesion. Review of the MIP images confirms the above findings. CTA ABDOMEN AND PELVIS FINDINGS VASCULAR Aorta: Infrarenal abdominal aorta is dilated to 3.1 x 2.9 cm (AP x TRV). There is atherosclerotic plaque, calcified and uncalcified, throughout the abdominal aorta. Prominent mural thrombus is noted at the level of the mid to lower pole the kidneys. There is a small dissection flap at the level of the lower pole the left kidneys and extends inferiorly. It is not clearly defined at the level of  the bifurcation, but a small dissection flap is noted at the origin of the left internal iliac artery. Abdominal aorta measured a maximum of 2.5 x 2.6 cm on the prior CT. The dissection findings are without convincing change, although are better visualized on the current CT angiogram. Celiac: Patent without evidence of aneurysm, dissection, vasculitis or significant stenosis. SMA: Patent without evidence of aneurysm, dissection, vasculitis or significant stenosis. Renals: Dual right renal arteries, without significant stenosis. Mild atherosclerotic plaque along the origin of the left renal artery without significant stenosis. IMA: Patent inferior mesenteric artery. 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 Mild dilation of the left common iliac artery to 1.4 cm. Veins: No obvious venous abnormality within the limitations of this arterial phase study. Review of the MIP images confirms the above findings. NON-VASCULAR Hepatobiliary: Normal liver. Multiple gallstones. No acute cholecystitis. No bile duct dilation. Pancreas: Unremarkable. No pancreatic ductal dilatation or surrounding inflammatory changes. Spleen: Normal in size without focal abnormality. Adrenals/Urinary Tract: Adrenal glands are unremarkable. Symmetric bilateral renal enhancement. Several cortical cysts, most subcentimeter, largest from the lateral lower pole the left kidney measuring 3.7 cm. No collecting system stones. No hydronephrosis. Ureters normal in course and caliber. Bladder grossly unremarkable, assessment limited by artifact from bilateral hip prostheses. Stomach/Bowel: Minimal hiatal hernia. Stomach otherwise unremarkable. Small bowel and colon are normal in caliber. No wall thickening. No inflammation. Scattered colonic diverticula, mostly on the left. No diverticulitis. Normal appendix visualized. Lymphatic: No enlarged  lymph nodes. Reproductive: Grossly unremarkable, partly obscured by artifact from bilateral hip prostheses. Other: No ascites.  No abdominal wall hernia. Musculoskeletal: No fracture or acute finding. No bone lesion. Bilateral hip prostheses are well seated and aligned. Review of the MIP images confirms the above findings. IMPRESSION: 1. 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 2. Infrarenal aortic aneurysm, 3.1 x 2.9 cm, mildly increased in size from the prior CT. Recommend follow-up ultrasound every 3 years. (Ref.: J Vasc Surg. 2018; 67:2-77 and J Am Coll Radiol 2013;10(10):789-794.) 3. Significant aortic atherosclerosis. No significant stenosis of the aorta or of its branch vessels. 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 4. Lung findings on chest CT consistent with bronchitis as detailed from the unenhanced chest CT performed earlier today.  Significant underlying emphysema. 5. No acute findings within the abdomen or pelvis. 6. Cholelithiasis and colonic diverticula. Electronically Signed   By: Alm Parkins M.D.   On: 05/06/2024 14:05   CT Chest Wo Contrast Result Date: 05/06/2024 CLINICAL DATA:  Follow-up right lower lung infiltrate on chest radiograph. EXAM: CT CHEST WITHOUT CONTRAST TECHNIQUE: Multidetector CT imaging of the chest was performed following the standard protocol without IV contrast. RADIATION DOSE REDUCTION: This exam was performed according to the departmental dose-optimization program which includes automated exposure control, adjustment of the mA and/or kV according to patient size and/or use of iterative reconstruction technique. COMPARISON:  Current and prior chest radiographs. Lung bases from an abdomen and pelvis CT, 12/08/2020. FINDINGS: Cardiovascular: Cardiac silhouette is normal in size. Three-vessel coronary artery calcifications. No  pericardial effusion. Great vessels are normal in caliber. Aortic atherosclerotic calcifications, moderate. Mediastinum/Nodes: No neck base, mediastinal or hilar masses. No enlarged lymph nodes. Trachea and esophagus are unremarkable. Lungs/Pleura: Advanced centrilobular emphysema. Bilateral bronchial wall thickening with mild bronchiectasis, most evident in the lower lobes and right middle lobe. Mild additional linear type opacities in the posterior aspect of the right lower lobe consistent with atelectasis. No lung consolidation. Multiple small bilateral nodules measuring up to 4 mm, many of which are calcified granuloma. No pleural effusion or pneumothorax. Upper Abdomen: No acute findings. Aortic atherosclerosis. Possible intramural hematoma versus focal dissection of the infrarenal aorta, similar to the previous CT scan. Multiple gallstones. Musculoskeletal: No fracture or acute finding.  No bone lesion. IMPRESSION: 1. Bronchial wall thickening in the lower lungs, mostly in the lower lobes and right middle lobe, increased when compared to the lung bases from the CT dated 12/08/2020. Findings support acute bronchitis in the proper clinical setting, although could be chronic. 2. No lung consolidation to suggest pneumonia. 3. Small lung nodules most of which are calcified consistent with calcified granuloma. Recommend follow-up of noncalcified nodules. No follow-up needed if patient is low-risk (and has no known or suspected primary neoplasm). Non-contrast chest CT can be considered in 12 months if patient is high-risk. This recommendation follows the consensus statement: Guidelines for Management of Incidental Pulmonary Nodules Detected on CT Images: From the Fleischner Society 2017; Radiology 2017; 284:228-243. 4. Possible dissection versus intramural hematoma of the visualized infrarenal abdominal aorta which is similar to the prior CT. Not mentioned above, visualized abdominal aorta is also dilated to 2.9 cm,  2.5 on the prior CT. Recommend the nonemergent follow-up with CTA of the abdomen and pelvis. Aortic Atherosclerosis (ICD10-I70.0) and Emphysema (ICD10-J43.9). Electronically Signed   By: Alm Parkins M.D.   On: 05/06/2024 11:55   DG Chest 2 View Result Date: 05/06/2024 EXAM: 2 VIEW(S) XRAY OF THE CHEST 05/06/2024 10:55:00 AM COMPARISON: None available. CLINICAL HISTORY: cough, sob, copd FINDINGS: LUNGS AND PLEURA: Pulmonary hyperinflation. Chronic bronchitic changes. Vague focal opacity projecting over the right lower lung. Consider chest CT for further evaluation. No pleural effusion. No pneumothorax. HEART AND MEDIASTINUM: No acute abnormality of the cardiac and mediastinal silhouettes. BONES AND SOFT TISSUES: No acute osseous abnormality. IMPRESSION: 1. Patchy opacity projecting over the right lower lobe. This is indeterminate and may represent confluent shadows, atelectasis, or pneumonia. Consider chest CT for further evaluation. 2. COPD. Electronically signed by: Norman Gatlin MD 05/06/2024 11:04 AM EST RP Workstation: [id number]     Procedures   Medications Ordered in the ED  ipratropium-albuterol  (DUONEB) 0.5-2.5 (3) MG/3ML nebulizer solution (3 mLs  Given 05/06/24 1012)  albuterol  (PROVENTIL ) (2.5 MG/3ML) 0.083%  nebulizer solution (2.5 mg  Given 05/06/24 1012)  ipratropium-albuterol  (DUONEB) 0.5-2.5 (3) MG/3ML nebulizer solution 3 mL (3 mLs Nebulization Given 05/06/24 1118)  methylPREDNISolone  sodium succinate (SOLU-MEDROL ) 125 mg/2 mL injection 125 mg (125 mg Intravenous Given 05/06/24 1131)  azithromycin  (ZITHROMAX ) tablet 500 mg (500 mg Oral Given 05/06/24 1324)  iohexol  (OMNIPAQUE ) 350 MG/ML injection 100 mL (100 mLs Intravenous Contrast Given 05/06/24 1331)    Clinical Course as of 05/06/24 1425  Thu May 06, 2024  1415 Discussed with Dr Pearline from vascular surgery. Recommends monitoring for lower extremity symptoms. Will get fu with repeat scan in 6 months. Will need to start on  a baby aspirin .  [RP]    Clinical Course User Index [RP] Yolande Lamar BROCKS, MD                                 Medical Decision Making Amount and/or Complexity of Data Reviewed Labs: ordered. Radiology: ordered.  Risk OTC drugs. Prescription drug management.   Steve Riley is a 73 year old male history of COPD on 2 L nasal cannula who presents to the emergency department with cough.   Initial Ddx:  URI, COPD exacerbation, CHF exacerbation, pneumonia  MDM/Course:  Patient presents to the emergency department with a cough.  Productive.  On since Monday.  Congestion.  No fevers or chills.  No chest pain or leg swelling.  On exam is satting well on his home 3 L.  Does have diffuse wheezing right greater than left.  Chest x-ray showed possible infiltrate on the right side and CT scan was recommended.  Noncontrast CT scan showed signs of bronchitis with a lung nodule that the patient was informed of.  It also showed a possible aortic dissection so CTA was obtained.  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  Did also show an aneurysm.  Discussed with vascular surgery who recommended follow-up in 6 months.  Patient was informed of this findings as well as the need to follow-up for imaging for his lung nodule in 1 year.  Was given azithromycin  and prednisone  for his COPD exacerbation.  Upon re-evaluation was feeling much better after the nebulizers.  Does have DuoNebs to take at home as well  This patient presents to the ED for concern of complaints listed in HPI, this involves an extensive number of treatment options, and is a complaint that carries with it a high risk of complications and morbidity. Disposition including potential need for admission considered.   Dispo: DC Home. Return precautions discussed including, but not limited to, those listed in the AVS. Allowed pt time to ask questions which were answered fully prior to dc.  Records reviewed Outpatient Clinic  Notes The following labs were independently interpreted: Chemistry and show no acute abnormality I independently reviewed the following imaging with scope of interpretation limited to determining acute life threatening conditions related to emergency care: Chest x-ray and agree with the radiologist interpretation with the following exceptions: none I personally reviewed and interpreted cardiac monitoring: normal sinus rhythm  I personally reviewed and interpreted the pt's EKG: see above for interpretation  I have reviewed the patients home medications and made adjustments as needed Consults: Vascular Surgery Social Determinants of health:  Geriatric  Portions of this note were generated with Scientist, clinical (histocompatibility and immunogenetics). Dictation errors may occur despite best attempts at proofreading.     Final diagnoses:  COPD exacerbation (HCC)  Dissection  of abdominal aorta (HCC)  Abdominal aortic aneurysm (AAA) without rupture, unspecified part  Lung nodule seen on imaging study    ED Discharge Orders          Ordered    aspirin  81 MG chewable tablet  Daily,   Status:  Discontinued        05/06/24 1418    azithromycin  (ZITHROMAX ) 250 MG tablet  Daily,   Status:  Discontinued        05/06/24 1418    predniSONE  (DELTASONE ) 20 MG tablet  Daily,   Status:  Discontinued        05/06/24 1418    aspirin  81 MG chewable tablet  Daily        05/06/24 1419    azithromycin  (ZITHROMAX ) 250 MG tablet  Daily        05/06/24 1419    predniSONE  (DELTASONE ) 20 MG tablet  Daily        05/06/24 1419               Yolande Lamar BROCKS, MD 05/06/24 1425

## 2024-05-06 NOTE — ED Notes (Signed)
 Patient transported to CT

## 2024-05-07 ENCOUNTER — Telehealth (HOSPITAL_BASED_OUTPATIENT_CLINIC_OR_DEPARTMENT_OTHER): Payer: Self-pay | Admitting: Emergency Medicine

## 2024-05-07 MED ORDER — AZITHROMYCIN 250 MG PO TABS: 250.0000 mg | ORAL_TABLET | Freq: Every day | ORAL | 0 refills | Status: DC

## 2024-05-07 MED ORDER — ASPIRIN 81 MG PO CHEW: 81.0000 mg | CHEWABLE_TABLET | Freq: Every day | ORAL | 0 refills | Status: AC

## 2024-05-07 MED ORDER — PREDNISONE 20 MG PO TABS: 40.0000 mg | ORAL_TABLET | Freq: Every day | ORAL | 0 refills | Status: AC

## 2024-05-07 NOTE — Telephone Encounter (Signed)
 Patient's prescriptions were sent to the wrong pharmacy.  I have resent them to his desired pharmacy.

## 2024-05-31 ENCOUNTER — Encounter: Admitting: Internal Medicine

## 2024-05-31 ENCOUNTER — Encounter

## 2024-06-01 ENCOUNTER — Encounter: Payer: Self-pay | Admitting: Internal Medicine

## 2024-06-01 ENCOUNTER — Ambulatory Visit: Admitting: Internal Medicine

## 2024-06-01 VITALS — BP 134/76 | HR 90 | Ht 68.0 in | Wt 156.8 lb

## 2024-06-01 DIAGNOSIS — F17210 Nicotine dependence, cigarettes, uncomplicated: Secondary | ICD-10-CM

## 2024-06-01 DIAGNOSIS — J449 Chronic obstructive pulmonary disease, unspecified: Secondary | ICD-10-CM

## 2024-06-01 DIAGNOSIS — J9611 Chronic respiratory failure with hypoxia: Secondary | ICD-10-CM

## 2024-06-01 MED ORDER — FAMOTIDINE 20 MG PO TABS: ORAL_TABLET | ORAL | 11 refills | Status: AC

## 2024-06-01 MED ORDER — PANTOPRAZOLE SODIUM 40 MG PO TBEC: 40.0000 mg | DELAYED_RELEASE_TABLET | Freq: Every day | ORAL | 2 refills | Status: AC

## 2024-06-01 NOTE — Patient Instructions (Addendum)
 Plan A = Automatic = Always=    symbicort  160 Take 2 puffs first thing in am and then another 2 puffs about 12 hours later and follow the AM dose with Spiriva  2.5x 2 each am   Pantoprazole  (protonix ) 40 mg   Take  30-60 min before first meal of the day and Pepcid  (famotidine )  20 mg after supper until return to office - this is the best way to tell whether stomach acid is contributing to your problem.     Plan B = Backup (to supplement plan A, not to replace it) Use your albuterol  inhaler as a rescue medication to be used if you can't catch your breath by resting or slowing your pace  or doing a relaxed purse lip breathing pattern.  - The less you use it, the better it will work when you need it. - Ok to use the inhaler up to 2 puffs  every 4 hours if you must but call for appointment if use goes up over your usual need - Don't leave home without it !!  (think of it like the spare tire or starter fluid for your car)   Plan C = Crisis (instead of Plan B but only if Plan B stops working) - only use your albuterol  nebulizer if you first try Plan B and it fails to help > ok to use the nebulizer up to every 4 hours but if start needing it regularly call for immediate appointment   Stop theophylline , the yupelri . All mint and menthol  and chocolate  - LUDEN's cough drops ok  For cough/ congestion > mucinex  or mucinex  dm  up to maximum of  1200 mg every 12 hours and use the flutter valve as much as you can    Make sure you check your oxygen saturation  AT  your highest level of activity (not after you stop)   to be sure it stays over 90% and adjust  02 flow upward to maintain this level if needed but remember to turn it back to previous settings when you stop (to conserve your supply).   Please schedule a follow up office visit in 8 weeks, sooner if needed  with all medications /inhalers/ solutions/ flutter valve  in hand so we can verify exactly what you are taking. 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

## 2024-06-01 NOTE — Assessment & Plan Note (Addendum)
 HST 01/31/23- AHI 0.5/hr, desaturation to 81%/ avg 87%, 171 lbs - as of 06/01/2024 wearing 2lpm HS and prn daytime to keep sats > 90%   As of 06/01/2024  just using 2lpm hs and prn daytime   Each maintenance medication was reviewed in detail including emphasizing most importantly the difference between maintenance and prns and under what circumstances the prns are to be triggered using an action plan format where appropriate.  Total time for H and P, chart review, counseling, reviewing hfa/smi/ neb/ 02/ pulse ox  device(s) and generating customized AVS unique to this office visit / same day charting = 42 min with pt new to me with multiple chronic  refractory respiratory  symptoms

## 2024-06-01 NOTE — Progress Notes (Addendum)
 "  Steve Riley, male    DOB: [birth year]   MRN: [id number]   Brief patient profile:  60  yowm still smoking    former Dr Neysa pt self referred to pulmonary clinic 06/01/2024  for cough / sob       PFT's  08/13/21  FEV1 1.29 (43 % ) ratio 0.37  p 8 % improvement from saba p ? prior to study with DLCO  11.9 (48%)   and FV curve classically concave    History of Present Illness  06/01/2024  Pulmonary/ 1st office eval/Yatzary Merriweather maint on theoph and multiple anticholinergics c/o dry mouth Chief Complaint  Patient presents with   Medical Management of Chronic Issues   COPD    Former Dr Neysa pt. He c/o DOE gradually worse since the last visit. He is coughing more, and is not producing any sputum.   Dyspnea:  room to room / rides scooter x years' Cough: non productive cause always swallows it - pattern is sporadic  Sleep: 30 degrees with one pillow usually settles down with minimal noct cough/wheeze  SABA use: very  02 use:  2lpm hs but freq comes off during the night s any symptoms when not wearing     No obvious day to day or daytime pattern/variability or assoc being aware if  purulent sputum or mucus plugs or hemoptysis or cp or chest tightness,   overt   hb symptoms.    Also denies any obvious fluctuation of symptoms with weather or environmental changes or other aggravating or alleviating factors except as outlined above   No unusual exposure hx or h/o childhood pna/ asthma or knowledge of premature birth.  Current Allergies, Complete Past Medical History, Past Surgical History, Family History, and Social History were reviewed in [employer] record.  ROS  The following are not active complaints unless bolded Hoarseness, sore throat= very dry , dysphagia, dental problems, itching, sneezing,  nasal congestion or discharge of excess mucus or purulent secretions, ear ache,   fever, chills, sweats, unintended wt loss or wt gain, classically pleuritic or exertional cp,   orthopnea pnd or arm/hand swelling  or leg swelling, presyncope, palpitations, abdominal pain =  bloating  anorexia, nausea, vomiting, diarrhea  or change in bowel habits or change in bladder habits, change in stools or change in urine, dysuria, hematuria,  rash, arthralgias, visual complaints, headache, numbness, weakness or ataxia or problems with walking or coordination,  change in mood= very anxious  or  memory.             Outpatient Medications Prior to Visit  Medication Sig Dispense Refill   albuterol  (PROVENTIL ) (2.5 MG/3ML) 0.083% nebulizer solution Take 3 mLs (2.5 mg total) by nebulization every 4 (four) hours as needed for wheezing or shortness of breath. 360 mL 5   APPLE CIDER VINEGAR PO Take by mouth in the morning and at bedtime.      Ascorbic Acid  (VITAMIN C  WITH ROSE HIPS) 1000 MG tablet Take 1,000 mg by mouth daily.     aspirin  81 MG chewable tablet Chew 1 tablet (81 mg total) by mouth daily. 30 tablet 2   aspirin  81 MG chewable tablet Chew 1 tablet (81 mg total) by mouth daily. 90 tablet 0   budesonide -formoterol  (SYMBICORT ) 160-4.5 MCG/ACT inhaler Inhale 2 puffs into the lungs 2 (two) times daily. 30.6 each 5   buPROPion  (WELLBUTRIN  SR) 150 MG 12 hr tablet Take 150 mg by mouth 2 (two) times daily.  Calcium  Carbonate-Vitamin D  600-400 MG-UNIT chew tablet Chew 1 tablet by mouth 2 (two) times daily.      Cyanocobalamin  (B-12) 5000 MCG CAPS Take 5,000 mcg by mouth daily.     Flaxseed, Linseed, (FLAX PO) Take 2,600 mg by mouth daily.     folic acid (FOLVITE) 1 MG tablet Take 1 tablet by mouth daily.     Probiotic CAPS Take 1 capsule by mouth 2 (two) times daily with a meal.     selenium  200 MCG TABS tablet Take 200 mcg by mouth daily.     tadalafil (CIALIS) 5 MG tablet Take 5 mg by mouth daily.     Tiotropium Bromide  Monohydrate (SPIRIVA  RESPIMAT) 2.5 MCG/ACT AERS Inhale 1 puff into the lungs in the morning and at bedtime. 12 g 4   ipratropium-albuterol  (DUONEB) 0.5-2.5 (3)  MG/3ML SOLN Inhale 3 mLs into the lungs every 6 (six) hours as needed (cough or wheeze or SOB).     revefenacin  (YUPELRI ) 175 MCG/3ML nebulizer solution Take 3 mLs (175 mcg total) by nebulization daily.     theophylline  (THEODUR) 200 MG 12 hr tablet Take 1 tablet (200 mg total) by mouth 2 (two) times daily. Take with food 60 tablet 5   azithromycin  (ZITHROMAX ) 250 MG tablet Take 1 tablet (250 mg total) by mouth daily. Take first 2 tablets together, then 1 every day until finished. 6 tablet 0   No facility-administered medications prior to visit.    Past Medical History:  Diagnosis Date   COPD (chronic obstructive pulmonary disease) (HCC)    Pneumonia    Prostate cancer (HCC)       Objective:     BP 134/76   Pulse 90   Ht 5' 8 (1.727 m)   Wt 156 lb 12.8 oz (71.1 kg)   SpO2 96% Comment: 2lpm cont o2.  BMI 23.84 kg/m   SpO2: 96 % (2lpm cont o2.)  Animated wm  with motorized wheelchair looks like a motorcycle// very easily confused with details of care/ does not know names of any his meds  Has rattling congested sounding cough.  HEENT : Oropharynx  clear/mucosa dry/ no teeth     Nasal turbinates nl    NECK :  without  apparent JVD/ palpable Nodes/TM    LUNGS: no acc muscle use,  Mild barrel  contour chest wall with bilateral  Distant bs s audible wheeze and  without cough on insp or exp maneuvers  and mild  Hyperresonant  to  percussion bilaterally     CV:  RRR  no s3 or murmur or increase in P2, and no edema   ABD:  soft and nontender   MS:  Nl gait/ ext warm without deformities Or obvious joint restrictions  calf tenderness, cyanosis or clubbing     SKIN: warm and dry without lesions    NEURO:  alert, approp, nl sensorium with  no motor or cerebellar deficits apparent.     I personally reviewed images and agree with radiology impression as follows:   Chest CT w/o contrast   05/06/24     Advanced centrilobular emphysema. Bilateral bronchial wall thickening with  mild bronchiectasis, most evident in the lower lobes and right middle lobe. Mild additional linear type opacities in the posterior aspect of the right lower lobe consistent with atelectasis      Assessment   Assessment & Plan COPD mixed type (HCC) Asthma COPD Overlap - 06/01/2024  After extensive coaching inhaler device,  effectiveness =  75% with hfa and smi from a basline of < 30%  - 06/01/2024 try off theoph/yupelri   and just use symbicort  160/ spiriva    DDX of  difficult airways management almost all start with A and  include Adherence, Ace Inhibitors, Acid Reflux, Active Sinus Disease, Alpha 1 Antitripsin deficiency, Anxiety masquerading as Airways dz,  ABPA,  Allergy(esp in young), Aspiration (esp in elderly), Adverse effects of meds,  Active smoking or vaping, A bunch of PE's (a small clot burden can't cause this syndrome unless there is already severe underlying pulm or vascular dz with poor reserve) plus two Bs  = Bronchiectasis and Beta blocker use..and one C= CHF   Adherence is always the initial prime suspect and is a multilayered concern that requires a trust but verify approach in every patient - starting with knowing how to use medications, especially inhalers, correctly, keeping up with refills and understanding the fundamental difference between maintenance and prns vs those medications only taken for a very short course and then stopped and not refilled.  - see hfa/smi training  - he clearly does not know meds so must return with all meds in hand using a trust but verify approach to confirm accurate Medication  Reconciliation The principal here is that until we are certain that the  patients are doing what we've asked, it makes no sense to ask them to do more.    Active smoking   - see sep a/p  Adverse effects of Meds ?   > try off theophyilline and yupelri  and just take symbicort /spiriva  correctly with duoneb as a backup   .? Acid (or non-acid) GERD > always  difficult to exclude as up to 75% of pts in some series report no assoc GI/ Heartburn symptoms> rec max (24h)  acid suppression and diet restrictions/ reviewed and instructions given in writing.   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 Anxiety > usually at the bottom of this list of usual suspects but should be much higher on this pt's based on H and P and note already on psychotropics and may interfere with adherence and also interpretation of response or lack thereof to symptom management which can be quite subjective.   Chronic respiratory failure with hypoxia (HCC) HST 01/31/23- AHI 0.5/hr, desaturation to 81%/ avg 87%, 171 lbs - as of 06/01/2024 wearing 2lpm HS and prn daytime to keep sats > 90%   As of 06/01/2024  just using 2lpm hs and prn daytime   Each maintenance medication was reviewed in detail including emphasizing most importantly the difference between maintenance and prns and under what circumstances the prns are to be triggered using an action plan format where appropriate.  Total time for H and P, chart review, counseling, reviewing hfa/smi/ neb/ 02/ pulse ox  device(s) and generating customized AVS unique to this office visit / same day charting = 42 min with pt new to me with multiple chronic  refractory respiratory  symptoms     Cigarette smoker 4  min discussion re active cigarette smoking in addition to office E&M  Ask about tobacco use:   ongoing  Advise quitting   I took an extended  opportunity with this patient to outline the consequences of continued cigarette use  in airway disorders based on all the data we have from the multiple national lung health studies (perfomed over decades at millions of dollars in cost)  indicating that smoking cessation,  not choice of inhalers or pulmonary physicians, is the most important aspect of his care.   Assess willingness:  Not committed at this point Assist in quit  attempt:  Per PCP when ready Arrange follow up:   Follow up per Primary Care planned     .    AVS  Patient Instructions  Plan A = Automatic = Always=    symbicort  160 Take 2 puffs first thing in am and then another 2 puffs about 12 hours later and follow the AM dose with Spiriva  2.5x 2 each am   Pantoprazole  (protonix ) 40 mg   Take  30-60 min before first meal of the day and Pepcid  (famotidine )  20 mg after supper until return to office - this is the best way to tell whether stomach acid is contributing to your problem.     Plan B = Backup (to supplement plan A, not to replace it) Use your albuterol  inhaler as a rescue medication to be used if you can't catch your breath by resting or slowing your pace  or doing a relaxed purse lip breathing pattern.  - The less you use it, the better it will work when you need it. - Ok to use the inhaler up to 2 puffs  every 4 hours if you must but call for appointment if use goes up over your usual need - Don't leave home without it !!  (think of it like the spare tire or starter fluid for your car)   Plan C = Crisis (instead of Plan B but only if Plan B stops working) - only use your albuterol  nebulizer if you first try Plan B and it fails to help > ok to use the nebulizer up to every 4 hours but if start needing it regularly call for immediate appointment   Stop theophylline , the yupelri . All mint and menthol  and chocolate  - LUDEN's cough drops ok  For cough/ congestion > mucinex  or mucinex  dm  up to maximum of  1200 mg every 12 hours and use the flutter valve as much as you can    Make sure you check your oxygen saturation  AT  your highest level of activity (not after you stop)   to be sure it stays over 90% and adjust  02 flow upward to maintain this level if needed but remember to turn it back to previous settings when you stop (to conserve your supply).   Please schedule a follow up office visit in 8 weeks, sooner if needed  with all  medications /inhalers/ solutions/ flutter valve  in hand so we can verify exactly what you are taking. 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

## 2024-06-01 NOTE — Assessment & Plan Note (Addendum)
 Asthma COPD Overlap - 06/01/2024  After extensive coaching inhaler device,  effectiveness =    75% with hfa and smi from a basline of < 30%  - 06/01/2024 try off theoph/yupelri   and just use symbicort  160/ spiriva    DDX of  difficult airways management almost all start with A and  include Adherence, Ace Inhibitors, Acid Reflux, Active Sinus Disease, Alpha 1 Antitripsin deficiency, Anxiety masquerading as Airways dz,  ABPA,  Allergy(esp in young), Aspiration (esp in elderly), Adverse effects of meds,  Active smoking or vaping, A bunch of PE's (a small clot burden can't cause this syndrome unless there is already severe underlying pulm or vascular dz with poor reserve) plus two Bs  = Bronchiectasis and Beta blocker use..and one C= CHF   Adherence is always the initial prime suspect and is a multilayered concern that requires a trust but verify approach in every patient - starting with knowing how to use medications, especially inhalers, correctly, keeping up with refills and understanding the fundamental difference between maintenance and prns vs those medications only taken for a very short course and then stopped and not refilled.  - see hfa/smi training  - he clearly does not know meds so must return with all meds in hand using a trust but verify approach to confirm accurate Medication  Reconciliation The principal here is that until we are certain that the  patients are doing what we've asked, it makes no sense to ask them to do more.    Active smoking   - see sep a/p  Adverse effects of Meds ?   > try off theophyilline and yupelri  and just take symbicort /spiriva  correctly with duoneb as a backup   .? Acid (or non-acid) GERD > always difficult to exclude as up to 75% of pts in some series report no assoc GI/ Heartburn symptoms> rec max (24h)  acid suppression and diet restrictions/ reviewed and instructions given in writing.   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 Anxiety > usually at the bottom of this list of usual suspects but should be much higher on this pt's based on H and P and note already on psychotropics and may interfere with adherence and also interpretation of response or lack thereof to symptom management which can be quite subjective.

## 2024-06-03 DIAGNOSIS — F17210 Nicotine dependence, cigarettes, uncomplicated: Secondary | ICD-10-CM | POA: Insufficient documentation

## 2024-06-03 NOTE — Assessment & Plan Note (Addendum)
 4-5 min discussion re active cigarette smoking in addition to office E&M  Ask about tobacco use:   ongoing Advise quitting   I took an extended  opportunity with this patient to outline the consequences of continued cigarette use  in airway disorders based on all the data we have from the multiple national lung health studies (perfomed over decades at millions of dollars in cost)  indicating that smoking cessation, not choice of inhalers or pulmonary physicians, is the most important aspect of his  care.   Assess willingness:  Not committed at this point Assist in quit attempt:  Per PCP when ready Arrange follow up:   Follow up per Primary Care planned

## 2024-06-15 ENCOUNTER — Telehealth: Payer: Self-pay | Admitting: Internal Medicine

## 2024-06-15 NOTE — Telephone Encounter (Signed)
 Copied from CRM [id number]. Topic: General - Other >> Jun 11, 2024  4:14 PM Devaughn RAMAN wrote: Reason for CRM: Pt called regarding an sticky note he has on his  d/c paperwork that states something expires 1.24.26 and it says consutt. Pt is unsure what it means or if he needs to do something. Please f/u with pt. >> Jun 14, 2024 12:45 PM [name] wrote: Patient is calling to confirm what this note means that he received with his paperwork.

## 2024-06-15 NOTE — Telephone Encounter (Signed)
 Sonny, It looks like you worked with Dr. Darlean on 06/01/24, do you know what sticky note he is referring?

## 2024-06-16 NOTE — Telephone Encounter (Signed)
 I am unsure what this is  It does not sound like it pertains to him and he should disregard  Called the pt and there was no answer and no option to leave msg  Will try back later

## 2024-06-18 NOTE — Telephone Encounter (Signed)
 I called and spoke with patient, he said he already got the situation with the sticky note sorted out.  While I was on the phone he said he was so sob since his medications were changed on 06/01/24 by Dr. Darlean.  He was doing Symbicort  2 puffs twice a day and Spiriva  1 puff twice a day.  Dr. Darlean told him to stop the Spiriva  at night and to stop using the nebulizer medication and use the rescue in haler.  He was extremely sob just with talking on the phone.  He said he can't do without the nebulizer medication (albuterol ) that he does at 6 am daily.  He said the rescue inhaler does absolutely no good at all.  He also wants to go back to using the Spiriva  at night as well.   He said it take him 20 minutes to do his nebulizer treatment and wants to get a new nebulizer machine through the [facility], one that only takes 10 minutes to do the treatment. I advised him I would get a message to Dr. Darlean and once we hear back from him we will call him back with his recommendations.  He verbalized understanding.  Dr. Darlean, Please advise regarding patient restarting the Spiriva  at bedtime and using his nebulizer treatment (albuterol ) instead of the inhaler.  He states the inhaler does not good at all.  He is also requesting a new nebulizer machine from the [facility] that only take 10 minutes to do the treatment.  Thank you.

## 2024-06-24 NOTE — Telephone Encounter (Signed)
 Pt thoroughly confused and not following his instruction from AVS but here they are anyway   Patient Instructions  Plan A = Automatic = Always=    symbicort  160 Take 2 puffs first thing in am and then another 2 puffs about 12 hours later and follow the AM dose with Spiriva  2.5x 2 each am    Pantoprazole  (protonix ) 40 mg   Take  30-60 min before first meal of the day and Pepcid  (famotidine )  20 mg after supper until return to office - this is the best way to tell whether stomach acid is contributing to your problem.       Plan B = Backup (to supplement plan A, not to replace it) Use your albuterol  inhaler as a rescue medication to be used if you can't catch your breath by resting or slowing your pace  or doing a relaxed purse lip breathing pattern.  - The less you use it, the better it will work when you need it. - Ok to use the inhaler up to 2 puffs  every 4 hours if you must but call for appointment if use goes up over your usual need - Don't leave home without it !!  (think of it like the spare tire or starter fluid for your car)    Plan C = Crisis (instead of Plan B but only if Plan B stops working) - only use your albuterol  nebulizer if you first try Plan B and it fails to help > ok to use the nebulizer up to every 4 hours but if start needing it regularly call for immediate appointment     Stop theophylline , the yupelri . All mint and menthol  and chocolate  - LUDEN's cough drops ok   Yupelri  and spiriva  are not to be used  together but otherwise fine with me if goes back to the way he was taking his meds previously and return asap to office with all meds in hand using a trust but verify approach to confirm accurate Medication  Reconciliation The principal here is that until we are certain that the  patients are doing what we've asked, it makes no sense to ask them to do more.    I called and spoke with the pt and notified of response per Dr. Darlean. He verbalized understanding. I advised I  am happy to order new neb machine from the [facility]. He wants to hold off on this until he finds the specific brand of machine he wants. He states he wants one that only takes 10 minutes.  He will call back later with this info. He wants to keep the appt with Dr Darlean as planned and is aware to bring all meds to this visit. He had refused to come [facility] any [facility] else [facility] and would not travel to [place].

## 2024-07-20 ENCOUNTER — Ambulatory Visit: Admitting: Internal Medicine

## 2024-11-12 ENCOUNTER — Ambulatory Visit: Admitting: Vascular Surgery
